# Patient Record
Sex: Male | Born: 1980 | Race: White | Hispanic: No | Marital: Single
Health system: Midwestern US, Community
[De-identification: ages and names within clinical notes are randomized; demographics above are authoritative.]

## PROBLEM LIST (undated history)

## (undated) DIAGNOSIS — N200 Calculus of kidney: Secondary | ICD-10-CM

## (undated) DIAGNOSIS — Q211 Atrial septal defect, unspecified: Secondary | ICD-10-CM

## (undated) DIAGNOSIS — I10 Essential (primary) hypertension: Secondary | ICD-10-CM

## (undated) DIAGNOSIS — A048 Other specified bacterial intestinal infections: Secondary | ICD-10-CM

## (undated) HISTORY — PX: ASD REPAIR: SHX258

## (undated) HISTORY — PX: FOOT SURGERY: SHX648

## (undated) HISTORY — PX: KNEE SURGERY: SHX244

---

## 2002-08-01 ENCOUNTER — Emergency Department (HOSPITAL_COMMUNITY): Admission: EM | Admit: 2002-08-01 | Discharge: 2002-08-01 | Payer: Self-pay | Admitting: Emergency Medicine

## 2004-02-24 ENCOUNTER — Emergency Department (HOSPITAL_COMMUNITY): Admission: EM | Admit: 2004-02-24 | Discharge: 2004-02-24 | Payer: Self-pay | Admitting: Emergency Medicine

## 2009-03-21 ENCOUNTER — Emergency Department: Payer: Self-pay | Admitting: Emergency Medicine

## 2009-07-21 ENCOUNTER — Emergency Department (HOSPITAL_COMMUNITY): Admission: EM | Admit: 2009-07-21 | Discharge: 2009-07-21 | Payer: Self-pay | Admitting: Emergency Medicine

## 2009-07-23 ENCOUNTER — Emergency Department: Payer: Self-pay | Admitting: Emergency Medicine

## 2013-03-20 ENCOUNTER — Emergency Department: Payer: Self-pay | Admitting: Emergency Medicine

## 2013-03-20 LAB — URINALYSIS, COMPLETE
Bacteria: NONE SEEN
Bilirubin,UR: NEGATIVE
Blood: NEGATIVE
Glucose,UR: NEGATIVE mg/dL (ref 0–75)
Ketone: NEGATIVE
Leukocyte Esterase: NEGATIVE
Nitrite: NEGATIVE
Ph: 5 (ref 4.5–8.0)
Protein: NEGATIVE
RBC,UR: <1 /HPF (ref 0–5)
Specific Gravity: 1.019 (ref 1.003–1.030)
Squamous Epithelial: NONE SEEN
WBC UR: 1 /HPF (ref 0–5)

## 2013-09-27 ENCOUNTER — Emergency Department: Payer: Self-pay | Admitting: Emergency Medicine

## 2013-09-27 LAB — CBC
HCT: 44.5 % (ref 40.0–52.0)
HGB: 14.9 g/dL (ref 13.0–18.0)
MCH: 30.9 pg (ref 26.0–34.0)
MCHC: 33.4 g/dL (ref 32.0–36.0)
MCV: 93 fL (ref 80–100)
Platelet: 234 10*3/uL (ref 150–440)
RBC: 4.81 10*6/uL (ref 4.40–5.90)
RDW: 12.8 % (ref 11.5–14.5)
WBC: 12 10*3/uL — ABNORMAL HIGH (ref 3.8–10.6)

## 2013-09-27 LAB — URINALYSIS, COMPLETE
Bacteria: NONE SEEN
Bilirubin,UR: NEGATIVE
Blood: NEGATIVE
Glucose,UR: NEGATIVE mg/dL (ref 0–75)
Ketone: NEGATIVE
Leukocyte Esterase: NEGATIVE
Nitrite: NEGATIVE
Ph: 8 (ref 4.5–8.0)
Protein: NEGATIVE
RBC,UR: NONE SEEN /HPF (ref 0–5)
Specific Gravity: 1.01 (ref 1.003–1.030)
Squamous Epithelial: NONE SEEN
WBC UR: NONE SEEN /HPF (ref 0–5)

## 2013-09-27 LAB — COMPREHENSIVE METABOLIC PANEL
Albumin: 3.8 g/dL (ref 3.4–5.0)
Alkaline Phosphatase: 63 U/L
Anion Gap: 9 (ref 7–16)
BUN: 11 mg/dL (ref 7–18)
Bilirubin,Total: 1.5 mg/dL — ABNORMAL HIGH (ref 0.2–1.0)
Calcium, Total: 8.7 mg/dL (ref 8.5–10.1)
Chloride: 103 mmol/L (ref 98–107)
Co2: 25 mmol/L (ref 21–32)
Creatinine: 1.03 mg/dL (ref 0.60–1.30)
EGFR (African American): >60
EGFR (Non-African Amer.): >60
Glucose: 101 mg/dL — ABNORMAL HIGH (ref 65–99)
Osmolality: 273 (ref 275–301)
Potassium: 3.6 mmol/L (ref 3.5–5.1)
SGOT(AST): 36 U/L (ref 15–37)
SGPT (ALT): 23 U/L
Sodium: 137 mmol/L (ref 136–145)
Total Protein: 7.8 g/dL (ref 6.4–8.2)

## 2013-09-27 LAB — DIFFERENTIAL
Basophil #: 0 10*3/uL (ref 0.0–0.1)
Basophil %: 0.2 %
Eosinophil #: 0 10*3/uL (ref 0.0–0.7)
Eosinophil %: 0.1 %
Lymphocyte #: 0.5 10*3/uL — ABNORMAL LOW (ref 1.0–3.6)
Lymphocyte %: 4.8 %
Monocyte #: 0.2 x10 3/mm (ref 0.2–1.0)
Monocyte %: 1.6 %
Neutrophil #: 10.7 10*3/uL — ABNORMAL HIGH (ref 1.4–6.5)
Neutrophil %: 93.3 %

## 2013-09-27 LAB — LIPASE, BLOOD: Lipase: 105 U/L (ref 73–393)

## 2013-10-02 LAB — CULTURE, BLOOD (SINGLE)

## 2013-10-23 ENCOUNTER — Emergency Department: Payer: Self-pay | Admitting: Emergency Medicine

## 2013-10-23 LAB — COMPREHENSIVE METABOLIC PANEL
Albumin: 3.6 g/dL (ref 3.4–5.0)
Alkaline Phosphatase: 66 U/L
Anion Gap: 13 (ref 7–16)
BUN: 8 mg/dL (ref 7–18)
Bilirubin,Total: 0.3 mg/dL (ref 0.2–1.0)
Calcium, Total: 8.6 mg/dL (ref 8.5–10.1)
Chloride: 111 mmol/L — ABNORMAL HIGH (ref 98–107)
Co2: 24 mmol/L (ref 21–32)
Creatinine: 1.04 mg/dL (ref 0.60–1.30)
EGFR (African American): >60
EGFR (Non-African Amer.): >60
Glucose: 85 mg/dL (ref 65–99)
Osmolality: 292 (ref 275–301)
Potassium: 3.7 mmol/L (ref 3.5–5.1)
SGOT(AST): 56 U/L — ABNORMAL HIGH (ref 15–37)
SGPT (ALT): 29 U/L
Sodium: 148 mmol/L — ABNORMAL HIGH (ref 136–145)
Total Protein: 7.7 g/dL (ref 6.4–8.2)

## 2013-10-23 LAB — SALICYLATE LEVEL: Salicylates, Serum: 4.3 mg/dL — ABNORMAL HIGH

## 2013-10-23 LAB — CBC
HCT: 45.7 % (ref 40.0–52.0)
HGB: 15 g/dL (ref 13.0–18.0)
MCH: 30.5 pg (ref 26.0–34.0)
MCHC: 32.8 g/dL (ref 32.0–36.0)
MCV: 93 fL (ref 80–100)
Platelet: 268 10*3/uL (ref 150–440)
RBC: 4.91 10*6/uL (ref 4.40–5.90)
RDW: 13.8 % (ref 11.5–14.5)
WBC: 14.9 10*3/uL — ABNORMAL HIGH (ref 3.8–10.6)

## 2013-10-23 LAB — ETHANOL: Ethanol: 293 mg/dL

## 2013-10-23 LAB — LIPASE, BLOOD: Lipase: 225 U/L (ref 73–393)

## 2013-10-23 LAB — ACETAMINOPHEN LEVEL: Acetaminophen: <2 ug/mL

## 2013-11-27 ENCOUNTER — Emergency Department: Payer: Self-pay | Admitting: Student

## 2013-11-27 LAB — URINALYSIS, COMPLETE
Bacteria: NONE SEEN
Bilirubin,UR: NEGATIVE
Blood: NEGATIVE
Glucose,UR: NEGATIVE mg/dL (ref 0–75)
Ketone: NEGATIVE
Leukocyte Esterase: NEGATIVE
Nitrite: NEGATIVE
Ph: 6 (ref 4.5–8.0)
Protein: NEGATIVE
RBC,UR: 1 /HPF (ref 0–5)
Specific Gravity: 1.001 (ref 1.003–1.030)
Squamous Epithelial: NONE SEEN
WBC UR: 1 /HPF (ref 0–5)

## 2013-11-27 LAB — COMPREHENSIVE METABOLIC PANEL
Albumin: 4.2 g/dL (ref 3.4–5.0)
Alkaline Phosphatase: 112 U/L
Anion Gap: 6 — ABNORMAL LOW (ref 7–16)
BUN: 7 mg/dL (ref 7–18)
Bilirubin,Total: 0.4 mg/dL (ref 0.2–1.0)
Calcium, Total: 9 mg/dL (ref 8.5–10.1)
Chloride: 107 mmol/L (ref 98–107)
Co2: 30 mmol/L (ref 21–32)
Creatinine: 0.82 mg/dL (ref 0.60–1.30)
EGFR (African American): >60
EGFR (Non-African Amer.): >60
Glucose: 75 mg/dL (ref 65–99)
Osmolality: 282 (ref 275–301)
Potassium: 4.2 mmol/L (ref 3.5–5.1)
SGOT(AST): 29 U/L (ref 15–37)
SGPT (ALT): 22 U/L
Sodium: 143 mmol/L (ref 136–145)
Total Protein: 9.1 g/dL — ABNORMAL HIGH (ref 6.4–8.2)

## 2013-11-27 LAB — ETHANOL: Ethanol: 171 mg/dL

## 2013-11-27 LAB — DRUG SCREEN, URINE
Amphetamines, Ur Screen: NEGATIVE (ref ?–1000)
Barbiturates, Ur Screen: NEGATIVE (ref ?–200)
Benzodiazepine, Ur Scrn: POSITIVE (ref ?–200)
Cannabinoid 50 Ng, Ur ~~LOC~~: POSITIVE (ref ?–50)
Cocaine Metabolite,Ur ~~LOC~~: NEGATIVE (ref ?–300)
MDMA (Ecstasy)Ur Screen: NEGATIVE (ref ?–500)
Methadone, Ur Screen: NEGATIVE (ref ?–300)
Opiate, Ur Screen: NEGATIVE (ref ?–300)
Phencyclidine (PCP) Ur S: NEGATIVE (ref ?–25)
Tricyclic, Ur Screen: NEGATIVE (ref ?–1000)

## 2013-11-27 LAB — CBC
HCT: 48.4 % (ref 40.0–52.0)
HGB: 16.1 g/dL (ref 13.0–18.0)
MCH: 31.4 pg (ref 26.0–34.0)
MCHC: 33.2 g/dL (ref 32.0–36.0)
MCV: 95 fL (ref 80–100)
Platelet: 273 10*3/uL (ref 150–440)
RBC: 5.12 10*6/uL (ref 4.40–5.90)
RDW: 13.6 % (ref 11.5–14.5)
WBC: 10.1 10*3/uL (ref 3.8–10.6)

## 2013-11-27 LAB — ACETAMINOPHEN LEVEL: Acetaminophen: <2 ug/mL

## 2013-11-27 LAB — SALICYLATE LEVEL: Salicylates, Serum: 5.3 mg/dL — ABNORMAL HIGH

## 2013-12-19 ENCOUNTER — Emergency Department: Payer: Self-pay | Admitting: Emergency Medicine

## 2013-12-19 LAB — BASIC METABOLIC PANEL
Anion Gap: 8 (ref 7–16)
BUN: 7 mg/dL (ref 7–18)
Calcium, Total: 8.1 mg/dL — ABNORMAL LOW (ref 8.5–10.1)
Chloride: 109 mmol/L — ABNORMAL HIGH (ref 98–107)
Co2: 24 mmol/L (ref 21–32)
Creatinine: 0.97 mg/dL (ref 0.60–1.30)
EGFR (African American): >60
EGFR (Non-African Amer.): >60
Glucose: 90 mg/dL (ref 65–99)
Osmolality: 279 (ref 275–301)
Potassium: 3.5 mmol/L (ref 3.5–5.1)
Sodium: 141 mmol/L (ref 136–145)

## 2013-12-19 LAB — CBC
HCT: 43.6 % (ref 40.0–52.0)
HGB: 14 g/dL (ref 13.0–18.0)
MCH: 30.8 pg (ref 26.0–34.0)
MCHC: 32.2 g/dL (ref 32.0–36.0)
MCV: 96 fL (ref 80–100)
Platelet: 265 10*3/uL (ref 150–440)
RBC: 4.55 10*6/uL (ref 4.40–5.90)
RDW: 14 % (ref 11.5–14.5)
WBC: 6.1 10*3/uL (ref 3.8–10.6)

## 2013-12-19 LAB — TROPONIN I: Troponin-I: <0.02 ng/mL

## 2014-09-22 ENCOUNTER — Emergency Department
Admission: EM | Admit: 2014-09-22 | Discharge: 2014-09-22 | Disposition: A | Discharge status: Home / Self Care | Payer: Self-pay | Attending: Emergency Medicine | Admitting: Emergency Medicine

## 2014-09-22 ENCOUNTER — Emergency Department: Payer: Self-pay

## 2014-09-22 DIAGNOSIS — S46012A Strain of muscle(s) and tendon(s) of the rotator cuff of left shoulder, initial encounter: Secondary | ICD-10-CM | POA: Insufficient documentation

## 2014-09-22 DIAGNOSIS — Y998 Other external cause status: Secondary | ICD-10-CM | POA: Insufficient documentation

## 2014-09-22 DIAGNOSIS — S40012A Contusion of left shoulder, initial encounter: Secondary | ICD-10-CM | POA: Insufficient documentation

## 2014-09-22 DIAGNOSIS — W228XXA Striking against or struck by other objects, initial encounter: Secondary | ICD-10-CM | POA: Insufficient documentation

## 2014-09-22 DIAGNOSIS — S46912A Strain of unspecified muscle, fascia and tendon at shoulder and upper arm level, left arm, initial encounter: Secondary | ICD-10-CM

## 2014-09-22 DIAGNOSIS — Y92480 Sidewalk as the place of occurrence of the external cause: Secondary | ICD-10-CM | POA: Insufficient documentation

## 2014-09-22 DIAGNOSIS — Y9389 Activity, other specified: Secondary | ICD-10-CM | POA: Insufficient documentation

## 2014-09-22 DIAGNOSIS — Z720 Tobacco use: Secondary | ICD-10-CM | POA: Insufficient documentation

## 2014-09-22 MED ORDER — KETOROLAC TROMETHAMINE 10 MG PO TABS: 10.0000 mg | ORAL_TABLET | Freq: Three times a day (TID) | ORAL | Status: DC

## 2014-09-22 MED ORDER — CYCLOBENZAPRINE HCL 5 MG PO TABS: 5.0000 mg | ORAL_TABLET | Freq: Three times a day (TID) | ORAL | Status: DC | PRN

## 2014-09-22 NOTE — ED Notes (Signed)
Pt reports to ED w/ c/o of shoulder pain.  Pt sts it began last night, denies any injury.
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
## 2014-09-22 NOTE — Discharge Instructions (Signed)
Shoulder Sprain °A shoulder sprain is the result of damage to the tough, fiber-like tissues (ligaments) that help hold your shoulder in place. The ligaments may be stretched or torn. Besides the main shoulder joint (the ball and socket), there are several smaller joints that connect the bones in this area. A sprain usually involves one of those joints. Most often it is the acromioclavicular (or AC) joint. That is the joint that connects the collarbone (clavicle) and the shoulder blade (scapula) at the top point of the shoulder blade (acromion). °A shoulder sprain is a mild form of what is called a shoulder separation. Recovering from a shoulder sprain may take some time. For some, pain lingers for several months. Most people recover without long term problems. °CAUSES  °· A shoulder sprain is usually caused by some kind of trauma. This might be: °¨ Falling on an outstretched arm. °¨ Being hit hard on the shoulder. °¨ Twisting the arm. °· Shoulder sprains are more likely to occur in people who: °¨ Play sports. °¨ Have balance or coordination problems. °SYMPTOMS  °· Pain when you move your shoulder. °· Limited ability to move the shoulder. °· Swelling and tenderness on top of the shoulder. °· Redness or warmth in the shoulder. °· Bruising. °· A change in the shape of the shoulder. °DIAGNOSIS  °Your healthcare provider may: °· Ask about your symptoms. °· Ask about recent activity that might have caused those symptoms. °· Examine your shoulder. You may be asked to do simple exercises to test movement. The other shoulder will be examined for comparison. °· Order some tests that provide a look inside the body. They can show the extent of the injury. The tests could include: °¨ X-rays. °¨ CT (computed tomography) scan. °¨ MRI (magnetic resonance imaging) scan. °RISKS AND COMPLICATIONS °· Loss of full shoulder motion. °· Ongoing shoulder pain. °TREATMENT  °How long it takes to recover from a shoulder sprain depends on how  severe it was. Treatment options may include: °· Rest. You should not use the arm or shoulder until it heals. °· Ice. For 2 or 3 days after the injury, put an ice pack on the shoulder up to 4 times a day. It should stay on for 15 to 20 minutes each time. Wrap the ice in a towel so it does not touch your skin. °· Over-the-counter medicine to relieve pain. °· A sling or brace. This will keep the arm still while the shoulder is healing. °· Physical therapy or rehabilitation exercises. These will help you regain strength and motion. Ask your healthcare provider when it is OK to begin these exercises. °· Surgery. The need for surgery is rare with a sprained shoulder, but some people may need surgery to keep the joint in place and reduce pain. °HOME CARE INSTRUCTIONS  °· Ask your healthcare provider about what you should and should not do while your shoulder heals. °· Make sure you know how to apply ice to the correct area of your shoulder. °· Talk with your healthcare provider about which medications should be used for pain and swelling. °· If rehabilitation therapy will be needed, ask your healthcare provider to refer you to a therapist. If it is not recommended, then ask about at-home exercises. Find out when exercise should begin. °SEEK MEDICAL CARE IF:  °Your pain, swelling, or redness at the joint increases. °SEEK IMMEDIATE MEDICAL CARE IF:  °· You have a fever. °· You cannot move your arm or shoulder. °Document Released: 06/29/2008 Document   Revised: 05/05/2011 Document Reviewed: 06/29/2008 [employer] Patient Information 2015 [place], [place]. This information is not intended to replace advice given to you by your health care provider. Make sure you discuss any questions you have with your health care provider.   Take the prescription meds as directed.  Apply ice and perform ROM exercises.  Follow-up with Dr. Ernest Pine as needed.
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
## 2014-12-01 ENCOUNTER — Encounter: Payer: Self-pay | Admitting: *Deleted

## 2014-12-01 DIAGNOSIS — Y998 Other external cause status: Secondary | ICD-10-CM | POA: Insufficient documentation

## 2014-12-01 DIAGNOSIS — Y9289 Other specified places as the place of occurrence of the external cause: Secondary | ICD-10-CM | POA: Insufficient documentation

## 2014-12-01 DIAGNOSIS — Z880 Allergy status to penicillin: Secondary | ICD-10-CM | POA: Insufficient documentation

## 2014-12-01 DIAGNOSIS — S6991XA Unspecified injury of right wrist, hand and finger(s), initial encounter: Secondary | ICD-10-CM | POA: Insufficient documentation

## 2014-12-01 DIAGNOSIS — Z79899 Other long term (current) drug therapy: Secondary | ICD-10-CM | POA: Insufficient documentation

## 2014-12-01 DIAGNOSIS — W1839XA Other fall on same level, initial encounter: Secondary | ICD-10-CM | POA: Insufficient documentation

## 2014-12-01 DIAGNOSIS — I10 Essential (primary) hypertension: Secondary | ICD-10-CM | POA: Insufficient documentation

## 2014-12-01 DIAGNOSIS — Z791 Long term (current) use of non-steroidal anti-inflammatories (NSAID): Secondary | ICD-10-CM | POA: Insufficient documentation

## 2014-12-01 DIAGNOSIS — Y9389 Activity, other specified: Secondary | ICD-10-CM | POA: Insufficient documentation

## 2014-12-01 DIAGNOSIS — S0181XA Laceration without foreign body of other part of head, initial encounter: Secondary | ICD-10-CM | POA: Insufficient documentation

## 2014-12-01 DIAGNOSIS — Z720 Tobacco use: Secondary | ICD-10-CM | POA: Insufficient documentation

## 2014-12-01 NOTE — ED Notes (Signed)
Pt presents in [employer] custody w/ laceration superior to L eyebrow and abrasion to L cheekbone.

## 2014-12-02 ENCOUNTER — Emergency Department

## 2014-12-02 ENCOUNTER — Emergency Department
Admission: EM | Admit: 2014-12-02 | Discharge: 2014-12-02 | Disposition: A | Discharge status: Home / Self Care | Attending: Emergency Medicine | Admitting: Emergency Medicine

## 2014-12-02 DIAGNOSIS — R51 Headache: Secondary | ICD-10-CM

## 2014-12-02 DIAGNOSIS — R519 Headache, unspecified: Secondary | ICD-10-CM

## 2014-12-02 DIAGNOSIS — T148XXA Other injury of unspecified body region, initial encounter: Secondary | ICD-10-CM

## 2014-12-02 DIAGNOSIS — IMO0002 Reserved for concepts with insufficient information to code with codable children: Secondary | ICD-10-CM

## 2014-12-02 HISTORY — DX: Atrial septal defect, unspecified: Q21.10

## 2014-12-02 HISTORY — DX: Calculus of kidney: N20.0

## 2014-12-02 HISTORY — DX: Essential (primary) hypertension: I10

## 2014-12-02 HISTORY — DX: Atrial septal defect: Q21.1

## 2014-12-02 LAB — CBC WITH DIFFERENTIAL/PLATELET
Basophils Absolute: 0.1 10*3/uL (ref 0–0.1)
Basophils Relative: 1 %
EOS ABS: 0 10*3/uL (ref 0–0.7)
Eosinophils Relative: 0 %
HEMATOCRIT: 43 % (ref 40.0–52.0)
Hemoglobin: 14.6 g/dL (ref 13.0–18.0)
Lymphocytes Relative: 19 %
Lymphs Abs: 1.7 10*3/uL (ref 1.0–3.6)
MCH: 32 pg (ref 26.0–34.0)
MCHC: 33.9 g/dL (ref 32.0–36.0)
MCV: 94.3 fL (ref 80.0–100.0)
MONOS PCT: 7 %
Monocytes Absolute: 0.6 10*3/uL (ref 0.2–1.0)
NEUTROS PCT: 73 %
Neutro Abs: 6.4 10*3/uL (ref 1.4–6.5)
Platelets: 229 10*3/uL (ref 150–440)
RBC: 4.56 MIL/uL (ref 4.40–5.90)
RDW: 13.2 % (ref 11.5–14.5)
WBC: 8.8 10*3/uL (ref 3.8–10.6)

## 2014-12-02 LAB — BASIC METABOLIC PANEL
ANION GAP: 9 (ref 5–15)
BUN: 9 mg/dL (ref 6–20)
CHLORIDE: 107 mmol/L (ref 101–111)
CO2: 24 mmol/L (ref 22–32)
Calcium: 8.5 mg/dL — ABNORMAL LOW (ref 8.9–10.3)
Creatinine, Ser: 0.67 mg/dL (ref 0.61–1.24)
GFR calc Af Amer: >60 mL/min (ref >60)
GFR calc non Af Amer: >60 mL/min (ref >60)
GLUCOSE: 87 mg/dL (ref 65–99)
POTASSIUM: 3.5 mmol/L (ref 3.5–5.1)
Sodium: 140 mmol/L (ref 135–145)

## 2014-12-02 MED ORDER — SODIUM CHLORIDE 0.9 % IV BOLUS (SEPSIS)
1000.0000 mL | Freq: Once | INTRAVENOUS | Status: AC
  Administered 2014-12-02: 1000 mL via INTRAVENOUS

## 2014-12-02 MED ORDER — IOHEXOL 350 MG/ML SOLN
100.0000 mL | Freq: Once | INTRAVENOUS | Status: AC | PRN
  Administered 2014-12-02: 100 mL via INTRAVENOUS
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
## 2014-12-02 NOTE — ED Notes (Signed)
Pt presents to ED with lacerations to above and below the left eye, laceration is bandaged with no bleeding noted at this time. Pt is unsure of exact mechanism of injury, states "I believe I just fell".  Pt in custody of [employer] officer upon arrival, officer in room at this time.

## 2016-06-04 ENCOUNTER — Emergency Department: Payer: Self-pay

## 2016-06-04 ENCOUNTER — Emergency Department
Admission: EM | Admit: 2016-06-04 | Discharge: 2016-06-04 | Disposition: A | Discharge status: Home / Self Care | Payer: Self-pay | Attending: Emergency Medicine | Admitting: Emergency Medicine

## 2016-06-04 ENCOUNTER — Encounter: Payer: Self-pay | Admitting: Medical Oncology

## 2016-06-04 ENCOUNTER — Encounter: Payer: Self-pay | Admitting: Emergency Medicine

## 2016-06-04 DIAGNOSIS — R519 Headache, unspecified: Secondary | ICD-10-CM

## 2016-06-04 DIAGNOSIS — Y999 Unspecified external cause status: Secondary | ICD-10-CM | POA: Insufficient documentation

## 2016-06-04 DIAGNOSIS — F17210 Nicotine dependence, cigarettes, uncomplicated: Secondary | ICD-10-CM | POA: Insufficient documentation

## 2016-06-04 DIAGNOSIS — Y929 Unspecified place or not applicable: Secondary | ICD-10-CM | POA: Insufficient documentation

## 2016-06-04 DIAGNOSIS — I10 Essential (primary) hypertension: Secondary | ICD-10-CM | POA: Insufficient documentation

## 2016-06-04 DIAGNOSIS — S51812A Laceration without foreign body of left forearm, initial encounter: Secondary | ICD-10-CM | POA: Insufficient documentation

## 2016-06-04 DIAGNOSIS — W25XXXA Contact with sharp glass, initial encounter: Secondary | ICD-10-CM | POA: Insufficient documentation

## 2016-06-04 DIAGNOSIS — Z79899 Other long term (current) drug therapy: Secondary | ICD-10-CM | POA: Insufficient documentation

## 2016-06-04 DIAGNOSIS — Y939 Activity, unspecified: Secondary | ICD-10-CM | POA: Insufficient documentation

## 2016-06-04 DIAGNOSIS — R51 Headache: Secondary | ICD-10-CM | POA: Insufficient documentation

## 2016-06-04 DIAGNOSIS — R109 Unspecified abdominal pain: Secondary | ICD-10-CM | POA: Insufficient documentation

## 2016-06-04 DIAGNOSIS — R0789 Other chest pain: Secondary | ICD-10-CM | POA: Insufficient documentation

## 2016-06-04 LAB — URINALYSIS, COMPLETE (UACMP) WITH MICROSCOPIC
BACTERIA UA: NONE SEEN
Bilirubin Urine: NEGATIVE
Glucose, UA: NEGATIVE mg/dL
HGB URINE DIPSTICK: NEGATIVE
KETONES UR: NEGATIVE mg/dL
LEUKOCYTES UA: NEGATIVE
NITRITE: NEGATIVE
Protein, ur: NEGATIVE mg/dL
SQUAMOUS EPITHELIAL / LPF: NONE SEEN
Specific Gravity, Urine: 1.006 (ref 1.005–1.030)
pH: 6 (ref 5.0–8.0)

## 2016-06-04 LAB — BASIC METABOLIC PANEL
Anion gap: 9 (ref 5–15)
BUN: 8 mg/dL (ref 6–20)
CALCIUM: 9.1 mg/dL (ref 8.9–10.3)
CO2: 25 mmol/L (ref 22–32)
CREATININE: 0.82 mg/dL (ref 0.61–1.24)
Chloride: 105 mmol/L (ref 101–111)
GFR calc non Af Amer: >60 mL/min (ref >60)
GLUCOSE: 92 mg/dL (ref 65–99)
Potassium: 3.4 mmol/L — ABNORMAL LOW (ref 3.5–5.1)
Sodium: 139 mmol/L (ref 135–145)

## 2016-06-04 LAB — CBC
HCT: 47.5 % (ref 40.0–52.0)
Hemoglobin: 16.2 g/dL (ref 13.0–18.0)
MCH: 30.9 pg (ref 26.0–34.0)
MCHC: 34.1 g/dL (ref 32.0–36.0)
MCV: 90.8 fL (ref 80.0–100.0)
PLATELETS: 228 10*3/uL (ref 150–440)
RBC: 5.24 MIL/uL (ref 4.40–5.90)
RDW: 15.3 % — ABNORMAL HIGH (ref 11.5–14.5)
WBC: 6.1 10*3/uL (ref 3.8–10.6)

## 2016-06-04 LAB — TROPONIN I

## 2016-06-04 MED ORDER — KETOROLAC TROMETHAMINE 30 MG/ML IJ SOLN
30.0000 mg | Freq: Once | INTRAMUSCULAR | Status: AC
  Administered 2016-06-04: 30 mg via INTRAVENOUS
  Filled 2016-06-04: qty 1

## 2016-06-04 MED ORDER — IBUPROFEN 800 MG PO TABS: 800.0000 mg | ORAL_TABLET | Freq: Three times a day (TID) | ORAL | 0 refills | Status: DC | PRN

## 2016-06-04 MED ORDER — OXYCODONE-ACETAMINOPHEN 5-325 MG PO TABS: 1.0000 | ORAL_TABLET | Freq: Four times a day (QID) | ORAL | 0 refills | Status: DC | PRN

## 2016-06-04 MED ORDER — LIDOCAINE HCL (PF) 1 % IJ SOLN
INTRAMUSCULAR | Status: AC
  Filled 2016-06-04: qty 5

## 2016-06-04 MED ORDER — SODIUM CHLORIDE 0.9 % IV SOLN
Freq: Once | INTRAVENOUS | Status: AC
  Administered 2016-06-04: 10:00:00 via INTRAVENOUS

## 2016-06-04 MED ORDER — METOCLOPRAMIDE HCL 5 MG/ML IJ SOLN
10.0000 mg | Freq: Once | INTRAMUSCULAR | Status: AC
  Administered 2016-06-04: 10 mg via INTRAVENOUS
  Filled 2016-06-04: qty 2

## 2016-06-04 NOTE — ED Notes (Signed)
Patient to Room 25, [facility] RN aware.
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
## 2016-06-04 NOTE — ED Triage Notes (Signed)
Patient here complaining of H/A (current), intermittent chest pain (not since yesterday), neck pain (current), left side abd. Pain describes it as "testicle".  All symptoms present for greater than 3 mos.  Patient states that the abd. Pain and his H/A and neck pain are what concerns him today.

## 2016-06-04 NOTE — ED Provider Notes (Signed)
[facility] Emergency Department Provider Note       Time seen: ----------------------------------------- 9:58 AM on 06/04/2016 -----------------------------------------     I have reviewed the triage vital signs and the nursing notes.   HISTORY   Chief Complaint Headache; Chest Pain; and Testicle Pain    HPI John [name] is a 36 y.o. male who presents to the ED for multiple complaints. Patient is here complaining of headache which he has currently, intermittent chest pain and currently with neck pain and left-sided abdominal pain. All the symptoms have been greater than 3 months. Patient states that the abdominal pain and his headache and neck pain with concerns today. Pain is 6 out of 10 in his head that is sharp, nothing makes it better or worse.   Past Medical History:  Diagnosis Date  . ASD (atrial septal defect)   . Hypertension   . Kidney stones     There are no active problems to display for this patient.   Past Surgical History:  Procedure Laterality Date  . ASD REPAIR    . FOOT SURGERY Right   . KNEE SURGERY Right     Allergies Penicillins  Social History Social History  Substance Use Topics  . Smoking status: Current Every Day Smoker    Packs/day: 0.50    Types: Cigarettes  . Smokeless tobacco: Never Used  . Alcohol use Yes     Comment: occasional    Review of Systems Constitutional: Negative for fever. Cardiovascular: Positive for chest pain Respiratory: Negative for shortness of breath. Gastrointestinal: Positive for flank pain Genitourinary: Negative for dysuria. Musculoskeletal: Positive for neck pain Skin: Negative for rash. Neurological: Positive for headache  10-point ROS otherwise negative.  ____________________________________________   PHYSICAL EXAM:  VITAL SIGNS: ED Triage Vitals  Enc Vitals Group     BP 06/04/16 0939 (!) 148/72     Pulse Rate 06/04/16 0939 (!) 101     Resp 06/04/16 0939 18      Temp 06/04/16 0939 98.8 F (37.1 C)     Temp Source 06/04/16 0939 Oral     SpO2 06/04/16 0939 97 %     Weight 06/04/16 0939 170 lb (77.1 kg)     Height 06/04/16 0939  (1.778 m)     Head Circumference --      Peak Flow --      Pain Score 06/04/16 0945 6     Pain Loc --      Pain Edu? --      Excl. in GC? --     Constitutional: Alert and oriented. Well appearing and in no distress. Eyes: Conjunctivae are normal. PERRL. Normal extraocular movements. ENT   Head: Normocephalic and atraumatic.   Nose: No congestion/rhinnorhea.   Mouth/Throat: Mucous membranes are moist.   Neck: No stridor. Cardiovascular: Normal rate, regular rhythm. No murmurs, rubs, or gallops. Respiratory: Normal respiratory effort without tachypnea nor retractions. Breath sounds are clear and equal bilaterally. No wheezes/rales/rhonchi. Gastrointestinal: Soft and nontender. Normal bowel sounds Musculoskeletal: Nontender with normal range of motion in extremities. No lower extremity tenderness nor edema. Neurologic:  Normal speech and language. No gross focal neurologic deficits are appreciated.  Skin:  Skin is warm, dry and intact. No rash noted. Psychiatric: Mood and affect are normal. Speech and behavior are normal.  ____________________________________________  EKG: Interpreted by me.Sinus rhythm rate 86 bpm, normal PR interval, normal QRS, normal QT, normal axis.  ____________________________________________  ED COURSE:  Pertinent labs & imaging  results that were available during my care of the patient were reviewed by me and considered in my medical decision making (see chart for details). Patient presents for multiple complaints, we will assess with labs and imaging as indicated.   Procedures ____________________________________________   LABS (pertinent positives/negatives)  Labs Reviewed  BASIC METABOLIC PANEL - Abnormal; Notable for the following:       Result Value    Potassium 3.4 (*)    All other components within normal limits  CBC - Abnormal; Notable for the following:    RDW 15.3 (*)    All other components within normal limits  URINALYSIS, COMPLETE (UACMP) WITH MICROSCOPIC - Abnormal; Notable for the following:    Color, Urine YELLOW (*)    APPearance CLEAR (*)    All other components within normal limits  TROPONIN I    RADIOLOGY  Chest x-ray  IMPRESSION: No active disease. ____________________________________________  FINAL ASSESSMENT AND PLAN  Headache, chest pain, abdominal pain  Plan: Patient's labs and imaging were dictated above. Patient had presented for multiple complaints of uncertain etiology. His workup has been largely negative here. Headache is currently improved, he is stable for outpatient follow-up.   Emily Filbert, MD   Note: This note was generated in part or whole with voice recognition software. Voice recognition is usually quite accurate but there are transcription errors that can and very often do occur. I apologize for any typographical errors that were not detected and corrected.     Emily Filbert, MD 06/04/16 1105

## 2016-06-04 NOTE — ED Notes (Signed)
telfa dressing and coban applied to left arm. Circulation remains WNL.

## 2016-06-04 NOTE — ED Notes (Signed)
See triage note  States a piece of glass cut his left forearm while he was crying a hutch  Laceration approx 1 inch

## 2016-06-04 NOTE — ED Triage Notes (Signed)
Pt reports he was carrying a hutch and a piece of glass cut the left forearm. Bleeding controlled.

## 2016-06-19 ENCOUNTER — Emergency Department
Admission: EM | Admit: 2016-06-19 | Discharge: 2016-06-19 | Disposition: A | Discharge status: Home / Self Care | Payer: Self-pay | Attending: Student in an Organized Health Care Education/Training Program | Admitting: Student in an Organized Health Care Education/Training Program

## 2016-06-19 ENCOUNTER — Emergency Department: Payer: Self-pay

## 2016-06-19 ENCOUNTER — Encounter: Payer: Self-pay | Admitting: Emergency Medicine

## 2016-06-19 DIAGNOSIS — R197 Diarrhea, unspecified: Secondary | ICD-10-CM | POA: Insufficient documentation

## 2016-06-19 DIAGNOSIS — R1032 Left lower quadrant pain: Secondary | ICD-10-CM | POA: Insufficient documentation

## 2016-06-19 DIAGNOSIS — F17210 Nicotine dependence, cigarettes, uncomplicated: Secondary | ICD-10-CM | POA: Insufficient documentation

## 2016-06-19 DIAGNOSIS — I10 Essential (primary) hypertension: Secondary | ICD-10-CM | POA: Insufficient documentation

## 2016-06-19 DIAGNOSIS — R05 Cough: Secondary | ICD-10-CM | POA: Insufficient documentation

## 2016-06-19 HISTORY — DX: Other specified bacterial intestinal infections: A04.8

## 2016-06-19 LAB — LIPASE, BLOOD: LIPASE: 24 U/L (ref 11–51)

## 2016-06-19 LAB — CBC
HEMATOCRIT: 44.9 % (ref 40.0–52.0)
HEMOGLOBIN: 15.5 g/dL (ref 13.0–18.0)
MCH: 31.6 pg (ref 26.0–34.0)
MCHC: 34.5 g/dL (ref 32.0–36.0)
MCV: 91.7 fL (ref 80.0–100.0)
Platelets: 265 10*3/uL (ref 150–440)
RBC: 4.89 MIL/uL (ref 4.40–5.90)
RDW: 14.8 % — ABNORMAL HIGH (ref 11.5–14.5)
WBC: 8.5 10*3/uL (ref 3.8–10.6)

## 2016-06-19 LAB — URINALYSIS, COMPLETE (UACMP) WITH MICROSCOPIC
BACTERIA UA: NONE SEEN
BILIRUBIN URINE: NEGATIVE
Glucose, UA: NEGATIVE mg/dL
HGB URINE DIPSTICK: NEGATIVE
KETONES UR: NEGATIVE mg/dL
LEUKOCYTES UA: NEGATIVE
Nitrite: NEGATIVE
PROTEIN: NEGATIVE mg/dL
RBC / HPF: NONE SEEN RBC/hpf (ref 0–5)
SQUAMOUS EPITHELIAL / LPF: NONE SEEN
Specific Gravity, Urine: 1.019 (ref 1.005–1.030)
WBC UA: NONE SEEN WBC/hpf (ref 0–5)
pH: 8 (ref 5.0–8.0)

## 2016-06-19 LAB — COMPREHENSIVE METABOLIC PANEL
ALT: 13 U/L — ABNORMAL LOW (ref 17–63)
AST: 30 U/L (ref 15–41)
Albumin: 3.9 g/dL (ref 3.5–5.0)
Alkaline Phosphatase: 54 U/L (ref 38–126)
Anion gap: 8 (ref 5–15)
BUN: 8 mg/dL (ref 6–20)
CHLORIDE: 104 mmol/L (ref 101–111)
CO2: 23 mmol/L (ref 22–32)
Calcium: 9.3 mg/dL (ref 8.9–10.3)
Creatinine, Ser: 0.85 mg/dL (ref 0.61–1.24)
GFR calc non Af Amer: >60 mL/min (ref >60)
Glucose, Bld: 143 mg/dL — ABNORMAL HIGH (ref 65–99)
POTASSIUM: 3.7 mmol/L (ref 3.5–5.1)
SODIUM: 135 mmol/L (ref 135–145)
Total Bilirubin: 1.2 mg/dL (ref 0.3–1.2)
Total Protein: 7.5 g/dL (ref 6.5–8.1)

## 2016-06-19 LAB — TROPONIN I

## 2016-06-19 MED ORDER — DICYCLOMINE HCL 10 MG PO CAPS
10.0000 mg | ORAL_CAPSULE | Freq: Once | ORAL | Status: AC
  Administered 2016-06-19: 10 mg via ORAL
  Filled 2016-06-19: qty 1

## 2016-06-19 MED ORDER — RANITIDINE HCL 150 MG PO TABS: 150.0000 mg | ORAL_TABLET | Freq: Two times a day (BID) | ORAL | 1 refills | Status: DC

## 2016-06-19 MED ORDER — PROMETHAZINE HCL 12.5 MG PO TABS: 12.5000 mg | ORAL_TABLET | Freq: Four times a day (QID) | ORAL | 0 refills | Status: DC | PRN

## 2016-06-19 MED ORDER — DICYCLOMINE HCL 10 MG PO CAPS
10.0000 mg | ORAL_CAPSULE | Freq: Three times a day (TID) | ORAL | 0 refills | Status: DC | PRN
Start: 2016-06-19 — End: 2017-06-03

## 2016-06-19 MED ORDER — PROMETHAZINE HCL 25 MG PO TABS
12.5000 mg | ORAL_TABLET | Freq: Once | ORAL | Status: AC
  Administered 2016-06-19: 12.5 mg via ORAL
  Filled 2016-06-19: qty 1

## 2016-06-19 NOTE — Discharge Instructions (Signed)

## 2016-06-19 NOTE — ED Triage Notes (Signed)
Patient presents to the ED with left lower quadrant abdominal pain, cough, and shortness of breath that have been intermittent x 2 weeks.  Patient reports vomiting x 2 in the past 24 hours and 1 episode of diarrhea.  Patient also reports coughing up mucous tinged with blood.  Patient is in no obvious distress at this time.  Patient states pain is not severe at this time.

## 2016-06-19 NOTE — ED Provider Notes (Signed)
[facility] Emergency Department Provider Note    First MD Initiated Contact with Patient 06/19/16 1427     (approximate)  I have reviewed the triage vital signs and the nursing notes.   HISTORY  Chief Complaint Abdominal Pain; Cough; and Emesis    HPI John [name] is a 36 y.o. male presents with multiple complaints but primarily he is concerned about left lower quadrant abdominal pain and diarrhea. States the symptoms have been ongoing for the past week. Denies any pain right now. Is not having any blood in his stool. Is concerned that this is from H pylori as he states that he was admitted to [facility] for H. pylori previously. Not on any other medications. Has had nausea and decreased appetite but no vomiting. States that he is also having night sweats and felt feverish this morning.  Does admit to smoking and has had a nonproductive cough.   Past Medical History:  Diagnosis Date  . ASD (atrial septal defect)   . Bacterial infection due to H. pylori   . Hypertension   . Kidney stones    No family history on file. Past Surgical History:  Procedure Laterality Date  . ASD REPAIR    . FOOT SURGERY Right   . KNEE SURGERY Right    There are no active problems to display for this patient.     Prior to Admission medications   Medication Sig Start Date End Date Taking? Authorizing Provider  cyclobenzaprine (FLEXERIL) 5 MG tablet Take 1 tablet (5 mg total) by mouth every 8 (eight) hours as needed for muscle spasms. 09/22/14   Jenise V Bacon Menshew, PA-C  ibuprofen (ADVIL,MOTRIN) 800 MG tablet Take 1 tablet (800 mg total) by mouth every 8 (eight) hours as needed. 06/04/16   Emily Filbert, MD  ketorolac (TORADOL) 10 MG tablet Take 1 tablet (10 mg total) by mouth every 8 (eight) hours. 09/22/14   Jenise V Bacon Menshew, PA-C  oxyCODONE-acetaminophen (ROXICET) 5-325 MG tablet Take 1 tablet by mouth every 6 (six) hours as needed for moderate  pain. 06/04/16   Joni Reining, PA-C    Allergies Penicillins    Social History Social History  Substance Use Topics  . Smoking status: Current Every Day Smoker    Packs/day: 0.50    Types: Cigarettes  . Smokeless tobacco: Never Used  . Alcohol use Yes     Comment: occasional    Review of Systems Patient denies headaches, rhinorrhea, blurry vision, numbness, shortness of breath, chest pain, edema, cough, abdominal pain, nausea, vomiting, diarrhea, dysuria, fevers, rashes or hallucinations unless otherwise stated above in HPI. ____________________________________________   PHYSICAL EXAM:  VITAL SIGNS: Vitals:   06/19/16 1258  BP: 127/88  Pulse: 86  Resp: 20  Temp: 98.9 F (37.2 C)    Constitutional: Alert and oriented. Well appearing and in no acute distress. Eyes: Conjunctivae are normal. PERRL. EOMI. Head: Atraumatic. Nose: No congestion/rhinnorhea. Mouth/Throat: Mucous membranes are moist.  Oropharynx non-erythematous. Neck: No stridor. Painless ROM. No cervical spine tenderness to palpation Hematological/Lymphatic/Immunilogical: No cervical lymphadenopathy. Cardiovascular: Normal rate, regular rhythm. Grossly normal heart sounds.  Good peripheral circulation. Respiratory: Normal respiratory effort.  No retractions. Lungs CTAB. Gastrointestinal: Soft and nontender. No distention. No abdominal bruits. No CVA tenderness.  No hernia Musculoskeletal: No lower extremity tenderness nor edema.  No joint effusions. Neurologic:  Normal speech and language. No gross focal neurologic deficits are appreciated. No gait instability. Skin:  Skin is  warm, dry and intact. No rash noted. Psychiatric: Mood and affect are normal. Speech and behavior are normal.  ____________________________________________   LABS (all labs ordered are listed, but only abnormal results are displayed)  Results for orders placed or performed during the hospital encounter of 06/19/16 (from the past  24 hour(s))  Lipase, blood     Status: None   Collection Time: 06/19/16  1:07 PM  Result Value Ref Range   Lipase 24 11 - 51 U/L  Comprehensive metabolic panel     Status: Abnormal   Collection Time: 06/19/16  1:07 PM  Result Value Ref Range   Sodium 135 135 - 145 mmol/L   Potassium 3.7 3.5 - 5.1 mmol/L   Chloride 104 101 - 111 mmol/L   CO2 23 22 - 32 mmol/L   Glucose, Bld 143 (H) 65 - 99 mg/dL   BUN 8 6 - 20 mg/dL   Creatinine, Ser 1.61 0.61 - 1.24 mg/dL   Calcium 9.3 8.9 - 09.6 mg/dL   Total Protein 7.5 6.5 - 8.1 g/dL   Albumin 3.9 3.5 - 5.0 g/dL   AST 30 15 - 41 U/L   ALT 13 (L) 17 - 63 U/L   Alkaline Phosphatase 54 38 - 126 U/L   Total Bilirubin 1.2 0.3 - 1.2 mg/dL   GFR calc non Af Amer >60 >60 mL/min   GFR calc Af Amer >60 >60 mL/min   Anion gap 8 5 - 15  CBC     Status: Abnormal   Collection Time: 06/19/16  1:07 PM  Result Value Ref Range   WBC 8.5 3.8 - 10.6 K/uL   RBC 4.89 4.40 - 5.90 MIL/uL   Hemoglobin 15.5 13.0 - 18.0 g/dL   HCT 04.5 40.9 - 81.1 %   MCV 91.7 80.0 - 100.0 fL   MCH 31.6 26.0 - 34.0 pg   MCHC 34.5 32.0 - 36.0 g/dL   RDW 91.4 (H) 78.2 - 95.6 %   Platelets 265 150 - 440 K/uL  Troponin I     Status: None   Collection Time: 06/19/16  1:07 PM  Result Value Ref Range   Troponin I <0.03 <0.03 ng/mL  Urinalysis, Complete w Microscopic     Status: Abnormal   Collection Time: 06/19/16  1:08 PM  Result Value Ref Range   Color, Urine AMBER (A) YELLOW   APPearance CLOUDY (A) CLEAR   Specific Gravity, Urine 1.019 1.005 - 1.030   pH 8.0 5.0 - 8.0   Glucose, UA NEGATIVE NEGATIVE mg/dL   Hgb urine dipstick NEGATIVE NEGATIVE   Bilirubin Urine NEGATIVE NEGATIVE   Ketones, ur NEGATIVE NEGATIVE mg/dL   Protein, ur NEGATIVE NEGATIVE mg/dL   Nitrite NEGATIVE NEGATIVE   Leukocytes, UA NEGATIVE NEGATIVE   RBC / HPF NONE SEEN 0 - 5 RBC/hpf   WBC, UA NONE SEEN 0 - 5 WBC/hpf   Bacteria, UA NONE SEEN NONE SEEN   Squamous Epithelial / LPF NONE SEEN NONE SEEN    Mucous PRESENT    Amorphous Crystal PRESENT    ____________________________________________  EKG My review and personal interpretation at Time: 13:15   Indication: epigastric  Rate: 85  Rhythm: sinus Axis: rightward Other: normal intervals, no st elevations ____________________________________________  RADIOLOGY  I personally reviewed all radiographic images ordered to evaluate for the above acute complaints and reviewed radiology reports and findings.  These findings were personally discussed with the patient.  Please see medical record for radiology report.  ____________________________________________  PROCEDURES  Procedure(s) performed:  Procedures    Critical Care performed: no ____________________________________________   INITIAL IMPRESSION / ASSESSMENT AND PLAN / ED COURSE  Pertinent labs & imaging results that were available during my care of the patient were reviewed by me and considered in my medical decision making (see chart for details).  DDX: enteritis, diverticulitis, colitis, IBD, dehydration, hernia, gastritis  ZACHAREE GADDIE is a 36 y.o. who presents to the ED with multiple complaints as described above.  Patient is AFVSS in ED. Exam as above. Given current presentation have considered the above differential.  Patient otherwise well appearing and his abdominal exam is soft and benign. Blood work is very reassuring. He has no laboratory evidence of acute pancreatitis, hepatitis, biliary pathology or acute infectious process. He has no findings to suggest hernia. Chest x-ray showed  no evidence of infiltrate or mass. He is low risk by well's and perc negative.     ----------------------------------------- 3:21 PM on 06/19/2016 -----------------------------------------  Repeat abdominal exam is soft and benign. X-ray shows no evidence of obstructive pattern. Possible left ureterolithiasis but history is less consistent with kidney stone. Urine analysis shows  no evidence of hematuria. Most likely nonspecific enteritis. Do not feel that emergent CT imaging is clinically indicated at this point. I discussed strict return precautions and provided referral for primary care.  Have discussed with the patient and available family all diagnostics and treatments performed thus far and all questions were answered to the best of my ability. The patient demonstrates understanding and agreement with plan.   ____________________________________________   FINAL CLINICAL IMPRESSION(S) / ED DIAGNOSES  Final diagnoses:  LLQ abdominal pain  Diarrhea, unspecified type      NEW MEDICATIONS STARTED DURING THIS VISIT:  New Prescriptions   No medications on file     Note:  This document was prepared using Dragon voice recognition software and may include unintentional dictation errors.    Willy Eddy, MD 06/19/16 [phone]

## 2016-06-19 NOTE — ED Notes (Signed)
Pt discharged home after verbalizing understanding of discharge instructions; nad noted. 

## 2016-11-12 ENCOUNTER — Encounter: Payer: Self-pay | Admitting: *Deleted

## 2016-11-12 ENCOUNTER — Emergency Department
Admission: EM | Admit: 2016-11-12 | Discharge: 2016-11-12 | Disposition: A | Discharge status: Home / Self Care | Payer: Self-pay | Attending: Emergency Medicine | Admitting: Emergency Medicine

## 2016-11-12 DIAGNOSIS — F17210 Nicotine dependence, cigarettes, uncomplicated: Secondary | ICD-10-CM | POA: Insufficient documentation

## 2016-11-12 DIAGNOSIS — Z79899 Other long term (current) drug therapy: Secondary | ICD-10-CM | POA: Insufficient documentation

## 2016-11-12 DIAGNOSIS — R112 Nausea with vomiting, unspecified: Secondary | ICD-10-CM | POA: Insufficient documentation

## 2016-11-12 DIAGNOSIS — K529 Noninfective gastroenteritis and colitis, unspecified: Secondary | ICD-10-CM | POA: Insufficient documentation

## 2016-11-12 DIAGNOSIS — R197 Diarrhea, unspecified: Secondary | ICD-10-CM

## 2016-11-12 DIAGNOSIS — I10 Essential (primary) hypertension: Secondary | ICD-10-CM | POA: Insufficient documentation

## 2016-11-12 LAB — CBC
HCT: 47.4 % (ref 40.0–52.0)
Hemoglobin: 16.4 g/dL (ref 13.0–18.0)
MCH: 32.4 pg (ref 26.0–34.0)
MCHC: 34.7 g/dL (ref 32.0–36.0)
MCV: 93.4 fL (ref 80.0–100.0)
PLATELETS: 229 10*3/uL (ref 150–440)
RBC: 5.07 MIL/uL (ref 4.40–5.90)
RDW: 14 % (ref 11.5–14.5)
WBC: 15.6 10*3/uL — ABNORMAL HIGH (ref 3.8–10.6)

## 2016-11-12 LAB — COMPREHENSIVE METABOLIC PANEL
ALBUMIN: 4.6 g/dL (ref 3.5–5.0)
ALK PHOS: 55 U/L (ref 38–126)
ALT: 21 U/L (ref 17–63)
AST: 38 U/L (ref 15–41)
Anion gap: 12 (ref 5–15)
BILIRUBIN TOTAL: 1.1 mg/dL (ref 0.3–1.2)
BUN: 9 mg/dL (ref 6–20)
CALCIUM: 9.5 mg/dL (ref 8.9–10.3)
CO2: 22 mmol/L (ref 22–32)
CREATININE: 0.68 mg/dL (ref 0.61–1.24)
Chloride: 101 mmol/L (ref 101–111)
GFR calc non Af Amer: >60 mL/min (ref >60)
Glucose, Bld: 90 mg/dL (ref 65–99)
Potassium: 3.6 mmol/L (ref 3.5–5.1)
SODIUM: 135 mmol/L (ref 135–145)
Total Protein: 8.5 g/dL — ABNORMAL HIGH (ref 6.5–8.1)

## 2016-11-12 LAB — URINALYSIS, COMPLETE (UACMP) WITH MICROSCOPIC
Bacteria, UA: NONE SEEN
Bilirubin Urine: NEGATIVE
GLUCOSE, UA: NEGATIVE mg/dL
Hgb urine dipstick: NEGATIVE
Ketones, ur: 5 mg/dL — AB
Leukocytes, UA: NEGATIVE
Nitrite: NEGATIVE
PH: 6 (ref 5.0–8.0)
Protein, ur: 30 mg/dL — AB
SPECIFIC GRAVITY, URINE: 1.023 (ref 1.005–1.030)
Squamous Epithelial / LPF: NONE SEEN

## 2016-11-12 LAB — LIPASE, BLOOD: Lipase: 27 U/L (ref 11–51)

## 2016-11-12 MED ORDER — FAMOTIDINE 20 MG PO TABS
20.0000 mg | ORAL_TABLET | Freq: Once | ORAL | Status: AC
Start: 2016-11-12 — End: 2016-11-12
  Administered 2016-11-12: 20 mg via ORAL
  Filled 2016-11-12: qty 1

## 2016-11-12 MED ORDER — ONDANSETRON HCL 4 MG PO TABS: 4.0000 mg | ORAL_TABLET | Freq: Three times a day (TID) | ORAL | 0 refills | Status: DC | PRN

## 2016-11-12 MED ORDER — FAMOTIDINE 20 MG PO TABS: 20.0000 mg | ORAL_TABLET | Freq: Two times a day (BID) | ORAL | 1 refills | Status: DC

## 2016-11-12 MED ORDER — GI COCKTAIL ~~LOC~~
30.0000 mL | Freq: Once | ORAL | Status: AC
  Administered 2016-11-12: 30 mL via ORAL
  Filled 2016-11-12: qty 30

## 2016-11-12 MED ORDER — ALUM & MAG HYDROXIDE-SIMETH 400-400-40 MG/5ML PO SUSP: 5.0000 mL | Freq: Four times a day (QID) | ORAL | 0 refills | Status: DC | PRN

## 2016-11-12 MED ORDER — ONDANSETRON 4 MG PO TBDP
4.0000 mg | ORAL_TABLET | Freq: Once | ORAL | Status: AC
  Administered 2016-11-12: 4 mg via ORAL
  Filled 2016-11-12: qty 1

## 2016-11-12 NOTE — Discharge Instructions (Signed)

## 2016-11-12 NOTE — ED Provider Notes (Signed)
[facility] Emergency Department Provider Note  ____________________________________________  Time seen: Approximately 2:39 PM  I have reviewed the triage vital signs and the nursing notes.   HISTORY  Chief Complaint Abdominal Pain and Diarrhea   HPI John [name] is a 36 y.o. male with a history of H. pylori, hypertension, kidney stones and presents for evaluation of 2 days N/V/D. 2-3 episodes each a day. No blood or melena. No fever but has had chills. Central cramping intermittent severe abdominal pain that resolves after a BM. NO pain at this time. No prior abdominal surgeries. no chest pain or shortness of breath, no dysuria or hematuria, no constipation or diarrhea. The patient is concerned about recurrent H. pylori infection.  Past Medical History:  Diagnosis Date  . ASD (atrial septal defect)   . Bacterial infection due to H. pylori   . Hypertension   . Kidney stones     There are no active problems to display for this patient.   Past Surgical History:  Procedure Laterality Date  . ASD REPAIR    . FOOT SURGERY Right   . KNEE SURGERY Right     Prior to Admission medications   Medication Sig Start Date End Date Taking? Authorizing Provider  alum & mag hydroxide-simeth (MAALOX MAX) 400-400-40 MG/5ML suspension Take 5 mLs by mouth every 6 (six) hours as needed for indigestion. 11/12/16   Nita Sickle, MD  cyclobenzaprine (FLEXERIL) 5 MG tablet Take 1 tablet (5 mg total) by mouth every 8 (eight) hours as needed for muscle spasms. 09/22/14   Menshew, Charlesetta Ivory, PA-C  dicyclomine (BENTYL) 10 MG capsule Take 1 capsule (10 mg total) by mouth 3 (three) times daily as needed for spasms. 06/19/16 07/03/16  Willy Eddy, MD  famotidine (PEPCID) 20 MG tablet Take 1 tablet (20 mg total) by mouth 2 (two) times daily. 11/12/16 11/12/17  Nita Sickle, MD  ibuprofen (ADVIL,MOTRIN) 800 MG tablet Take 1 tablet (800 mg total) by mouth every 8  (eight) hours as needed. 06/04/16   Emily Filbert, MD  ketorolac (TORADOL) 10 MG tablet Take 1 tablet (10 mg total) by mouth every 8 (eight) hours. 09/22/14   Menshew, Charlesetta Ivory, PA-C  ondansetron (ZOFRAN) 4 MG tablet Take 1 tablet (4 mg total) by mouth every 8 (eight) hours as needed for nausea or vomiting. 11/12/16   Don Perking, [place], MD  oxyCODONE-acetaminophen (ROXICET) 5-325 MG tablet Take 1 tablet by mouth every 6 (six) hours as needed for moderate pain. 06/04/16   Joni Reining, PA-C  promethazine (PHENERGAN) 12.5 MG tablet Take 1 tablet (12.5 mg total) by mouth every 6 (six) hours as needed for nausea or vomiting. 06/19/16   Willy Eddy, MD  ranitidine (ZANTAC) 150 MG tablet Take 1 tablet (150 mg total) by mouth 2 (two) times daily. 06/19/16 06/19/17  Willy Eddy, MD    Allergies Penicillins  History reviewed. No pertinent family history.  Social History Social History  Substance Use Topics  . Smoking status: Current Every Day Smoker    Packs/day: 0.50    Types: Cigarettes  . Smokeless tobacco: Never Used  . Alcohol use Yes     Comment: occasional    Review of Systems  Constitutional: Negative for fever. Eyes: Negative for visual changes. ENT: Negative for sore throat. Neck: No neck pain  Cardiovascular: Negative for chest pain. Respiratory: Negative for shortness of breath. Gastrointestinal: + diffuse cramping abdominal pain, vomiting and diarrhea. Genitourinary: Negative for dysuria. Musculoskeletal:  Negative for back pain. Skin: Negative for rash. Neurological: Negative for headaches, weakness or numbness. Psych: No SI or HI  ____________________________________________   PHYSICAL EXAM:  VITAL SIGNS: ED Triage Vitals  Enc Vitals Group     BP 11/12/16 1151 130/85     Pulse Rate 11/12/16 1151 84     Resp 11/12/16 1151 16     Temp 11/12/16 1151 98.6 F (37 C)     Temp Source 11/12/16 1151 Oral     SpO2 11/12/16 1151 98 %     Weight  11/12/16 1151 160 lb (72.6 kg)     Height 11/12/16 1151  (1.778 m)     Head Circumference --      Peak Flow --      Pain Score 11/12/16 1426 6     Pain Loc --      Pain Edu? --      Excl. in GC? --     Constitutional: Alert and oriented. Well appearing and in no apparent distress. HEENT:      Head: Normocephalic and atraumatic.         Eyes: Conjunctivae are normal. Sclera is non-icteric.       Mouth/Throat: Mucous membranes are moist.       Neck: Supple with no signs of meningismus. Cardiovascular: Regular rate and rhythm. No murmurs, gallops, or rubs. 2+ symmetrical distal pulses are present in all extremities. No JVD. Respiratory: Normal respiratory effort. Lungs are clear to auscultation bilaterally. No wheezes, crackles, or rhonchi.  Gastrointestinal: Soft, non tender, and non distended with positive bowel sounds. No rebound or guarding. Genitourinary: No CVA tenderness. Musculoskeletal: Nontender with normal range of motion in all extremities. No edema, cyanosis, or erythema of extremities. Neurologic: Normal speech and language. Face is symmetric. Moving all extremities. No gross focal neurologic deficits are appreciated. Skin: Skin is warm, dry and intact. No rash noted. Psychiatric: Mood and affect are normal. Speech and behavior are normal.  ____________________________________________   LABS (all labs ordered are listed, but only abnormal results are displayed)  Labs Reviewed  COMPREHENSIVE METABOLIC PANEL - Abnormal; Notable for the following:       Result Value   Total Protein 8.5 (*)    All other components within normal limits  CBC - Abnormal; Notable for the following:    WBC 15.6 (*)    All other components within normal limits  URINALYSIS, COMPLETE (UACMP) WITH MICROSCOPIC - Abnormal; Notable for the following:    Color, Urine AMBER (*)    APPearance CLEAR (*)    Ketones, ur 5 (*)    Protein, ur 30 (*)    All other components within normal limits    LIPASE, BLOOD   ____________________________________________  EKG  none  ____________________________________________  RADIOLOGY  none  ____________________________________________   PROCEDURES  Procedure(s) performed: None Procedures Critical Care performed:  None ____________________________________________   INITIAL IMPRESSION / ASSESSMENT AND PLAN / ED COURSE  36 y.o. male with a history of H. pylori, hypertension, kidney stones and presents for evaluation of 2 days N/V/Dand intermittent crampy abdominal pain. Patient is extremely well appearing, no distress, is normal vital signs, his abdomen is soft with no tenderness throughout. Presentation concerning for viral gastroenteritis. Patient will be given Zofran, Pepcid, and Maalox and is going to be discharged home on these prescriptions. Recommend increased oral hydration. Discussed return precautions if the pain returns and localizes to the right lower quadrant or right upper quadrant, if he develops a fever,  if he is unable to hydrate himself orally. Otherwise patient can follow-up with primary care doctor.     Pertinent labs & imaging results that were available during my care of the patient were reviewed by me and considered in my medical decision making (see chart for details).    ____________________________________________   FINAL CLINICAL IMPRESSION(S) / ED DIAGNOSES  Final diagnoses:  Nausea vomiting and diarrhea  Gastroenteritis      NEW MEDICATIONS STARTED DURING THIS VISIT:  New Prescriptions   ALUM & MAG HYDROXIDE-SIMETH (MAALOX MAX) 400-400-40 MG/5ML SUSPENSION    Take 5 mLs by mouth every 6 (six) hours as needed for indigestion.   FAMOTIDINE (PEPCID) 20 MG TABLET    Take 1 tablet (20 mg total) by mouth 2 (two) times daily.   ONDANSETRON (ZOFRAN) 4 MG TABLET    Take 1 tablet (4 mg total) by mouth every 8 (eight) hours as needed for nausea or vomiting.     Note:  This document was prepared  using Dragon voice recognition software and may include unintentional dictation errors.    Don Perking, [place], MD 11/12/16 [phone]

## 2016-11-12 NOTE — ED Notes (Signed)
Pt interrupting this RN while assessment is in process.   Pt state hx of H. Pylori in 2007.  Pt states abd pain and N&V. States hot and cold but will not state if he's had a temperature or not.

## 2016-11-12 NOTE — ED Triage Notes (Signed)
PT to ED reporting lower abd pain x 1 week that has worsened over the past 2 days. PT reports having NVD. Pt denies having checked temp at home but reports having cold chills intermittently. Hx of H. Pylori and reports feeling similar to this.

## 2017-06-03 ENCOUNTER — Other Ambulatory Visit: Payer: Self-pay

## 2017-06-03 ENCOUNTER — Emergency Department: Payer: Self-pay

## 2017-06-03 ENCOUNTER — Encounter: Payer: Self-pay | Admitting: Emergency Medicine

## 2017-06-03 ENCOUNTER — Emergency Department
Admission: EM | Admit: 2017-06-03 | Discharge: 2017-06-03 | Disposition: A | Discharge status: Home / Self Care | Payer: Self-pay | Attending: Emergency Medicine | Admitting: Emergency Medicine

## 2017-06-03 DIAGNOSIS — J209 Acute bronchitis, unspecified: Secondary | ICD-10-CM | POA: Insufficient documentation

## 2017-06-03 DIAGNOSIS — F17210 Nicotine dependence, cigarettes, uncomplicated: Secondary | ICD-10-CM | POA: Insufficient documentation

## 2017-06-03 DIAGNOSIS — I10 Essential (primary) hypertension: Secondary | ICD-10-CM | POA: Insufficient documentation

## 2017-06-03 MED ORDER — PSEUDOEPH-BROMPHEN-DM 30-2-10 MG/5ML PO SYRP: 5.0000 mL | ORAL_SOLUTION | Freq: Four times a day (QID) | ORAL | 0 refills | Status: DC | PRN

## 2017-06-03 MED ORDER — PREDNISONE 10 MG PO TABS: ORAL_TABLET | ORAL | 0 refills | Status: DC

## 2017-06-03 MED ORDER — SULFAMETHOXAZOLE-TRIMETHOPRIM 800-160 MG PO TABS: 1.0000 | ORAL_TABLET | Freq: Two times a day (BID) | ORAL | 0 refills | Status: DC

## 2017-06-03 NOTE — Discharge Instructions (Signed)
Follow-up with [facility] or [facility] if any continued problems.  Begin taking medication only as directed.  Bromfed-DM 4 times a day as needed for cough and congestion. Prednisone beginning with 6 tablets and taper down over the next 6 days.  Bactrim DS twice daily for 10 days.  Discontinue or decrease smoking.  Increase fluids.  Also consider taking Zyrtec or Claritin for seasonal allergies as these 2 do not cause drowsiness.

## 2017-06-03 NOTE — ED Provider Notes (Signed)
[facility] Emergency Department Provider Note  ___________________________________________   First MD Initiated Contact with Patient 06/03/17 [phone]     (approximate)  I have reviewed the triage vital signs and the nursing notes.   HISTORY  Chief Complaint Headache  HPI John [name] is a 37 y.o. male complaint of headache, fever, body aches and nausea for 1 day.  Patient also has had some upper respiratory issues and has been coughing.  Patient continues to smoke cigarettes daily.  He has not been taking any over-the-counter medication.  He complains of seasonal allergies.  He currently rates his pain is 6/10.  Past Medical History:  Diagnosis Date  . ASD (atrial septal defect)   . Bacterial infection due to H. pylori   . Hypertension   . Kidney stones     There are no active problems to display for this patient.   Past Surgical History:  Procedure Laterality Date  . ASD REPAIR    . FOOT SURGERY Right   . KNEE SURGERY Right     Prior to Admission medications   Medication Sig Start Date End Date Taking? Authorizing Provider  brompheniramine-pseudoephedrine-DM 30-2-10 MG/5ML syrup Take 5 mLs by mouth 4 (four) times daily as needed. 06/03/17   Tommi Rumps, PA-C  oxyCODONE-acetaminophen (ROXICET) 5-325 MG tablet Take 1 tablet by mouth every 6 (six) hours as needed for moderate pain. 06/04/16   Joni Reining, PA-C  predniSONE (DELTASONE) 10 MG tablet Take 6 tablets  today, on day 2 take 5 tablets, day 3 take 4 tablets, day 4 take 3 tablets, day 5 take  2 tablets and 1 tablet the last day 06/03/17   Tommi Rumps, PA-C  sulfamethoxazole-trimethoprim (BACTRIM DS,SEPTRA DS) 800-160 MG tablet Take 1 tablet by mouth 2 (two) times daily. 06/03/17   Tommi Rumps, PA-C    Allergies Penicillins  No family history on file.  Social History Social History   Tobacco Use  . Smoking status: Current Every Day Smoker    Packs/day: 0.50   Types: Cigarettes  . Smokeless tobacco: Never Used  Substance Use Topics  . Alcohol use: Yes    Comment: occasional  . Drug use: No    Review of Systems Constitutional: No fever/chills Eyes: No visual changes. ENT: Positive for nasal congestion. Cardiovascular: Denies chest pain. Respiratory: Denies shortness of breath.  Positive for cough. Gastrointestinal: No abdominal pain.  Positive nausea, no vomiting.  No diarrhea.   Genitourinary: Negative for dysuria. Musculoskeletal: Positive for body aches. Skin: Negative for rash. Neurological: Positive for headaches, no focal weakness or numbness. ____________________________________________   PHYSICAL EXAM:  VITAL SIGNS: ED Triage Vitals [06/03/17 0850]  Enc Vitals Group     BP 121/68     Pulse Rate (!) 104     Resp 18     Temp 98.8 F (37.1 C)     Temp Source Oral     SpO2 96 %     Weight      Height      Head Circumference      Peak Flow      Pain Score 6     Pain Loc      Pain Edu?      Excl. in GC?    Constitutional: Alert and oriented. Well appearing and in no acute distress. Eyes: Conjunctivae are normal.  Head: Atraumatic. Nose: Moderate congestion/rhinnorhea.  TMs are dull bilaterally. Mouth/Throat: Mucous membranes are moist.  Oropharynx non-erythematous. Neck:  No stridor.   Hematological/Lymphatic/Immunilogical: No cervical lymphadenopathy. Cardiovascular: Normal rate, regular rhythm. Grossly normal heart sounds.  Good peripheral circulation. Respiratory: Normal respiratory effort.  No retractions. Lungs bilateral expiratory wheeze and coarse congested cough is noted throughout.  Patient is in no respiratory distress and is able to talk in complete sentences without any difficulty. Gastrointestinal: Soft and nontender. No distention.  Musculoskeletal: Moves upper and lower extremities without any difficulty and normal gait was noted. Neurologic:  Normal speech and language. No gross focal neurologic  deficits are appreciated. No gait instability. Skin:  Skin is warm, dry and intact. No rash noted. Psychiatric: Mood and affect are normal. Speech and behavior are normal.  ____________________________________________   LABS (all labs ordered are listed, but only abnormal results are displayed)  Labs Reviewed - No data to display  RADIOLOGY  ED MD interpretation:  Chest x-ray negative for pneumonia.  Official radiology report(s): Dg Chest 2 View  Result Date: 06/03/2017 CLINICAL DATA:  Cough and fever EXAM: CHEST - 2 VIEW COMPARISON:  June 19, 2016 FINDINGS: Lungs are clear. The heart size and pulmonary vascularity are normal. No adenopathy. There is an old healed fracture of the right clavicle. There is mild anterior wedging of the T12 vertebral body, stable. IMPRESSION: No edema or consolidation. Electronically Signed   By: Bretta BangWilliam  Woodruff III M.D.   On: 06/03/2017 10:43    ____________________________________________   PROCEDURES  Procedure(s) performed: None  Procedures  Critical Care performed: No  ____________________________________________   INITIAL IMPRESSION / ASSESSMENT AND PLAN / ED COURSE  Patient is here with viral upper respiratory symptoms.  Chest x-ray was reassuring that there was no pneumonia.  Patient was placed on prednisone, Bromfed-DM and Bactrim DS twice daily for 10 days.  Patient is encouraged to discontinue smoking and increase fluids.  He will take Tylenol or ibuprofen as needed for body aches or fever.  He is to follow-up with his PCP or [facility] if any continued problems.  ____________________________________________   FINAL CLINICAL IMPRESSION(S) / ED DIAGNOSES  Final diagnoses:  Acute bronchitis, unspecified organism  Cigarette smoker     ED Discharge Orders        Ordered    predniSONE (DELTASONE) 10 MG tablet     06/03/17 1051    brompheniramine-pseudoephedrine-DM 30-2-10 MG/5ML syrup  4 times daily PRN     06/03/17  1051    sulfamethoxazole-trimethoprim (BACTRIM DS,SEPTRA DS) 800-160 MG tablet  2 times daily     06/03/17 1051       Note:  This document was prepared using Dragon voice recognition software and may include unintentional dictation errors.    Tommi RumpsSummers, Rhonda L, PA-C 06/03/17 1538    Emily FilbertWilliams, Jonathan E, MD 06/03/17 [phone]

## 2017-06-03 NOTE — ED Notes (Signed)
See triage note  Presents with fever and headache since last pm  Sates fever at home was 101  But afebrile on arrival  Also has had cough

## 2017-06-03 NOTE — ED Triage Notes (Signed)
Headache , fever, body aches , nausea x1 day

## 2017-06-03 NOTE — ED Notes (Signed)
States he is stressed and is worried about not being able to pick up his son  Encouraged to relax

## 2017-06-25 ENCOUNTER — Emergency Department
Admission: EM | Admit: 2017-06-25 | Discharge: 2017-06-25 | Disposition: A | Discharge status: Home / Self Care | Payer: Self-pay | Attending: Emergency Medicine | Admitting: Emergency Medicine

## 2017-06-25 ENCOUNTER — Other Ambulatory Visit: Payer: Self-pay

## 2017-06-25 DIAGNOSIS — I10 Essential (primary) hypertension: Secondary | ICD-10-CM | POA: Insufficient documentation

## 2017-06-25 DIAGNOSIS — F17210 Nicotine dependence, cigarettes, uncomplicated: Secondary | ICD-10-CM | POA: Insufficient documentation

## 2017-06-25 DIAGNOSIS — A55 Chlamydial lymphogranuloma (venereum): Secondary | ICD-10-CM | POA: Insufficient documentation

## 2017-06-25 MED ORDER — DOXYCYCLINE HYCLATE 100 MG PO CAPS: 100.0000 mg | ORAL_CAPSULE | Freq: Two times a day (BID) | ORAL | 0 refills | Status: AC

## 2017-06-25 NOTE — ED Triage Notes (Addendum)
Pt comes via POV with c/o abcess on left lower side. Pt states today it is swollen more than yesterday and is more like a knot. Pt states pain but denies discharge and no redness. Denies fever, chills, N/V.

## 2017-06-25 NOTE — ED Provider Notes (Signed)
[facility] Emergency Department Provider Note  ____________________________________________  Time seen: Approximately 9:14 PM  I have reviewed the triage vital signs and the nursing notes.   HISTORY  Chief Complaint Abscess    HPI John [name] is a 37 y.o. male presents to the emergency department with 3 cm of erythema and edema along left groin that started today.  Patient reports that symptoms do not change with a supine or standing position.  Patient is accompanied by his girlfriend who recently underwent STD testing.  He denies discharge from the penis or dysuria.  He has been afebrile.  He denies new sexual partners.  No alleviating measures have been attempted.   Past Medical History:  Diagnosis Date  . ASD (atrial septal defect)   . Bacterial infection due to H. pylori   . Hypertension   . Kidney stones     There are no active problems to display for this patient.   Past Surgical History:  Procedure Laterality Date  . ASD REPAIR    . FOOT SURGERY Right   . KNEE SURGERY Right     Prior to Admission medications   Medication Sig Start Date End Date Taking? Authorizing Provider  brompheniramine-pseudoephedrine-DM 30-2-10 MG/5ML syrup Take 5 mLs by mouth 4 (four) times daily as needed. 06/03/17   Tommi Rumps, PA-C  doxycycline (VIBRAMYCIN) 100 MG capsule Take 1 capsule (100 mg total) by mouth 2 (two) times daily for 21 days. 06/25/17 07/16/17  Orvil Feil, PA-C  oxyCODONE-acetaminophen (ROXICET) 5-325 MG tablet Take 1 tablet by mouth every 6 (six) hours as needed for moderate pain. 06/04/16   Joni Reining, PA-C  predniSONE (DELTASONE) 10 MG tablet Take 6 tablets  today, on day 2 take 5 tablets, day 3 take 4 tablets, day 4 take 3 tablets, day 5 take  2 tablets and 1 tablet the last day 06/03/17   Tommi Rumps, PA-C  sulfamethoxazole-trimethoprim (BACTRIM DS,SEPTRA DS) 800-160 MG tablet Take 1 tablet by mouth 2 (two) times daily.  06/03/17   Tommi Rumps, PA-C    Allergies Penicillins  No family history on file.  Social History Social History   Tobacco Use  . Smoking status: Current Every Day Smoker    Packs/day: 0.50    Types: Cigarettes  . Smokeless tobacco: Never Used  Substance Use Topics  . Alcohol use: Yes    Comment: occasional  . Drug use: No     Review of Systems  Constitutional: No fever/chills Eyes: No visual changes. No discharge ENT: No upper respiratory complaints. Cardiovascular: no chest pain. Respiratory: no cough. No SOB. Gastrointestinal: No abdominal pain.  No nausea, no vomiting.  No diarrhea.  No constipation. Genitourinary: Negative for dysuria. No hematuria Musculoskeletal: Negative for musculoskeletal pain. Skin: Patient has erythema and edema of left groin. Neurological: Negative for headaches, focal weakness or numbness.  ____________________________________________   PHYSICAL EXAM:  VITAL SIGNS: ED Triage Vitals  Enc Vitals Group     BP 06/25/17 2102 140/78     Pulse Rate 06/25/17 2102 98     Resp 06/25/17 2102 18     Temp 06/25/17 2102 98.8 F (37.1 C)     Temp Source 06/25/17 2102 Oral     SpO2 06/25/17 2102 99 %     Weight 06/25/17 2102 169 lb (76.7 kg)     Height 06/25/17 2102  (1.753 m)     Head Circumference --      Peak  Flow --      Pain Score 06/25/17 2103 7     Pain Loc --      Pain Edu? --      Excl. in GC? --      Constitutional: Alert and oriented. Well appearing and in no acute distress. Eyes: Conjunctivae are normal. PERRL. EOMI. Head: Atraumatic. Cardiovascular: Normal rate, regular rhythm. Normal S1 and S2.  Good peripheral circulation. Respiratory: Normal respiratory effort without tachypnea or retractions. Lungs CTAB. Good air entry to the bases with no decreased or absent breath sounds. Gastrointestinal: Bowel sounds 4 quadrants. Soft and nontender to palpation. No guarding or rigidity. No palpable masses. No distention.  No CVA tenderness.  Patient has approximately 3 cm of erythema and edema along left groin.  Edema is soft with no palpable induration.  No change in physical exam findings with standing or supine position. Musculoskeletal: Full range of motion to all extremities. No gross deformities appreciated. Neurologic:  Normal speech and language. No gross focal neurologic deficits are appreciated.  Skin:  Skin is warm, dry and intact. No rash noted.  ____________________________________________   LABS (all labs ordered are listed, but only abnormal results are displayed)  Labs Reviewed - No data to display ____________________________________________  EKG   ____________________________________________  RADIOLOGY  No results found.  ____________________________________________    PROCEDURES  Procedure(s) performed:    Procedures    Medications - No data to display   ____________________________________________   INITIAL IMPRESSION / ASSESSMENT AND PLAN / ED COURSE  Pertinent labs & imaging results that were available during my care of the patient were reviewed by me and considered in my medical decision making (see chart for details).  Review of the [place] CSRS was performed in accordance of the [facility] prior to dispensing any controlled drugs.    Assessment and plan Lymphogranuloma venereum Patient presents to the emergency department with left-sided groin erythema and edema.  Differential diagnosis included cellulitis, abscess and lymphogranuloma venereum.  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  Patient was evaluated by myself as well as Cranston Neighbor, PA-C.  Patient was discharged with doxycycline and advised to return to the emergency department if symptoms do not improve.  Vital signs are reassuring prior to discharge.    ____________________________________________  FINAL CLINICAL IMPRESSION(S) / ED DIAGNOSES  Final diagnoses:   Lymphogranuloma venereum      NEW MEDICATIONS STARTED DURING THIS VISIT:  ED Discharge Orders        Ordered    doxycycline (VIBRAMYCIN) 100 MG capsule  2 times daily     06/25/17 2249          This chart was dictated using voice recognition software/Dragon. Despite best efforts to proofread, errors can occur which can change the meaning. Any change was purely unintentional.    Orvil Feil, PA-C 06/25/17 2318    Myrna Blazer, MD 06/26/17 Marlyne Beards

## 2017-06-25 NOTE — ED Notes (Signed)
Pt to the er for pain and swelling to the left side inguinal area. Pt has a large palpable mass to the left side of the inguinal area. Tender with palpation. Pt reports lifting a trailer hitch the other day. Area just popped up. No drainage or redness noted

## 2017-07-27 ENCOUNTER — Other Ambulatory Visit: Payer: Self-pay

## 2017-07-27 ENCOUNTER — Encounter: Payer: Self-pay | Admitting: Emergency Medicine

## 2017-07-27 ENCOUNTER — Emergency Department
Admission: EM | Admit: 2017-07-27 | Discharge: 2017-07-27 | Disposition: A | Discharge status: Home / Self Care | Payer: Self-pay | Attending: Emergency Medicine | Admitting: Emergency Medicine

## 2017-07-27 DIAGNOSIS — I10 Essential (primary) hypertension: Secondary | ICD-10-CM | POA: Insufficient documentation

## 2017-07-27 DIAGNOSIS — F17210 Nicotine dependence, cigarettes, uncomplicated: Secondary | ICD-10-CM | POA: Insufficient documentation

## 2017-07-27 DIAGNOSIS — F10220 Alcohol dependence with intoxication, uncomplicated: Secondary | ICD-10-CM

## 2017-07-27 DIAGNOSIS — F10229 Alcohol dependence with intoxication, unspecified: Secondary | ICD-10-CM | POA: Insufficient documentation

## 2017-07-27 LAB — COMPREHENSIVE METABOLIC PANEL
ALK PHOS: 54 U/L (ref 38–126)
ALT: 15 U/L — AB (ref 17–63)
AST: 28 U/L (ref 15–41)
Albumin: 4.1 g/dL (ref 3.5–5.0)
Anion gap: 13 (ref 5–15)
BILIRUBIN TOTAL: 0.5 mg/dL (ref 0.3–1.2)
BUN: 6 mg/dL (ref 6–20)
CO2: 21 mmol/L — ABNORMAL LOW (ref 22–32)
CREATININE: 0.73 mg/dL (ref 0.61–1.24)
Calcium: 8.6 mg/dL — ABNORMAL LOW (ref 8.9–10.3)
Chloride: 106 mmol/L (ref 101–111)
GFR calc Af Amer: >60 mL/min (ref >60)
GFR calc non Af Amer: >60 mL/min (ref >60)
Glucose, Bld: 124 mg/dL — ABNORMAL HIGH (ref 65–99)
Potassium: 3.3 mmol/L — ABNORMAL LOW (ref 3.5–5.1)
Sodium: 140 mmol/L (ref 135–145)
TOTAL PROTEIN: 7.6 g/dL (ref 6.5–8.1)

## 2017-07-27 LAB — CBC
HEMATOCRIT: 48.7 % (ref 40.0–52.0)
Hemoglobin: 16.7 g/dL (ref 13.0–18.0)
MCH: 32.4 pg (ref 26.0–34.0)
MCHC: 34.2 g/dL (ref 32.0–36.0)
MCV: 94.7 fL (ref 80.0–100.0)
Platelets: 245 10*3/uL (ref 150–440)
RBC: 5.14 MIL/uL (ref 4.40–5.90)
RDW: 13.7 % (ref 11.5–14.5)
WBC: 8.4 10*3/uL (ref 3.8–10.6)

## 2017-07-27 LAB — ETHANOL: Alcohol, Ethyl (B): 265 mg/dL — ABNORMAL HIGH (ref <10)

## 2017-07-27 NOTE — Discharge Instructions (Signed)
You were seen in the emergency department for alcohol dependence.  It is your preference to leave at this time and you do have the ability to make your own decisions, but please seek help from the recommended resources for assistance with your alcohol dependence.  If you have any thoughts of hurting herself or others, please call 911 or return to the emergency department.  Please avoid drug and alcohol use.  Never drive a vehicle or operate machinery while intoxicated; you are being allowed to be discharged only because you have a sober family member who is coming to pick you up.  Please do not drink and drive.

## 2017-07-27 NOTE — ED Provider Notes (Addendum)
[facility] Emergency Department Provider Note  ____________________________________________   First MD Initiated Contact with Patient 07/27/17 1544     (approximate)  I have reviewed the triage vital signs and the nursing notes.   HISTORY  Chief Complaint Alcohol Problem    HPI John [name] is a 37 y.o. male with a history of chronic alcohol dependence who presents voluntarily for alcohol detox.  He states that he drinks heavily, both beer and liquor, every day, but he is able to keep a job and help take care of his family.  He recognizes that he needs help and he was looking for an inpatient treatment option.  However while he was waiting he was told by his boss that he could have a few days but could not be gone for a month or he will lose his job.  At the same time he also heard back from his son who was apparently ill and he needs to go to [place] so he is asking to go.  He is asking if we can just provide him with some contact information for some local resources.  He is very anxious to leave because of his ill son and apparently his father has only a limited time during which she can come pick him up from the ED.  The patient is ambulatory without any difficulty and is speaking clearly and coherently.  He denies SI and HI.  He denies fever/chills, chest pain, shortness of breath, nausea, vomiting, abdominal pain.  His dependence is severe and chronic.  Nothing particular makes it better or worse.  Past Medical History:  Diagnosis Date  . ASD (atrial septal defect)   . Bacterial infection due to H. pylori   . Hypertension   . Kidney stones     There are no active problems to display for this patient.   Past Surgical History:  Procedure Laterality Date  . ASD REPAIR    . FOOT SURGERY Right   . KNEE SURGERY Right     Prior to Admission medications   Medication Sig Start Date End Date Taking? Authorizing Provider    brompheniramine-pseudoephedrine-DM 30-2-10 MG/5ML syrup Take 5 mLs by mouth 4 (four) times daily as needed. 06/03/17   Tommi Rumps, PA-C  oxyCODONE-acetaminophen (ROXICET) 5-325 MG tablet Take 1 tablet by mouth every 6 (six) hours as needed for moderate pain. 06/04/16   Joni Reining, PA-C  predniSONE (DELTASONE) 10 MG tablet Take 6 tablets  today, on day 2 take 5 tablets, day 3 take 4 tablets, day 4 take 3 tablets, day 5 take  2 tablets and 1 tablet the last day 06/03/17   Tommi Rumps, PA-C  sulfamethoxazole-trimethoprim (BACTRIM DS,SEPTRA DS) 800-160 MG tablet Take 1 tablet by mouth 2 (two) times daily. 06/03/17   Tommi Rumps, PA-C    Allergies Penicillins  No family history on file.  Social History Social History   Tobacco Use  . Smoking status: Current Every Day Smoker    Packs/day: 0.50    Types: Cigarettes  . Smokeless tobacco: Never Used  Substance Use Topics  . Alcohol use: Yes    Comment: occasional  . Drug use: No    Review of Systems Constitutional: No fever/chills Eyes: No visual changes. ENT: No sore throat. Cardiovascular: Denies chest pain. Respiratory: Denies shortness of breath. Gastrointestinal: No abdominal pain.  No nausea, no vomiting.  No diarrhea.  No constipation. Genitourinary: Negative for dysuria. Musculoskeletal: Negative for neck pain.  Negative for back pain. Integumentary: Negative for rash. Neurological: Negative for headaches, focal weakness or numbness. Psychiatric:Alcohol dependence.  No SI or HI.  ____________________________________________   PHYSICAL EXAM:  VITAL SIGNS: ED Triage Vitals [07/27/17 1402]  Enc Vitals Group     BP 132/78     Pulse Rate (!) 108     Resp 16     Temp 98.2 F (36.8 C)     Temp Source Oral     SpO2 96 %     Weight 65.3 kg (144 lb)     Height 1.753 m (5\' 9" )     Head Circumference      Peak Flow      Pain Score 0     Pain Loc      Pain Edu?      Excl. in GC?      Constitutional: Alert and oriented. Well appearing and in no acute distress. Eyes: Conjunctivae are normal. PERRL. EOMI. Head: Atraumatic. Nose: No congestion/rhinnorhea. Mouth/Throat: Mucous membranes are moist. Neck: No stridor.  No meningeal signs.   Cardiovascular: Normal rate, regular rhythm. Good peripheral circulation. Grossly normal heart sounds. Respiratory: Normal respiratory effort.  No retractions. Lungs CTAB. Gastrointestinal: Soft and nontender. No distention.  Musculoskeletal: No lower extremity tenderness nor edema. No gross deformities of extremities. Neurologic:  Normal speech and language. No gross focal neurologic deficits are appreciated.  Skin:  Skin is warm, dry and intact. No rash noted. Psychiatric: Mood and affect are normal. Speech and behavior are normal.  Denies SI and HI  ____________________________________________   LABS (all labs ordered are listed, but only abnormal results are displayed)  Labs Reviewed  COMPREHENSIVE METABOLIC PANEL - Abnormal; Notable for the following components:      Result Value   Potassium 3.3 (*)    CO2 21 (*)    Glucose, Bld 124 (*)    Calcium 8.6 (*)    ALT 15 (*)    All other components within normal limits  ETHANOL - Abnormal; Notable for the following components:   Alcohol, Ethyl (B) 265 (*)    All other components within normal limits  CBC  URINE DRUG SCREEN, QUALITATIVE ([facility] ONLY)   ____________________________________________  EKG  None - EKG not ordered by ED physician ____________________________________________  RADIOLOGY   ED MD interpretation: No indication for imaging  Official radiology report(s): No results found.  ____________________________________________   PROCEDURES  Critical Care performed: No   Procedure(s) performed:   Procedures   ____________________________________________   INITIAL IMPRESSION / ASSESSMENT AND PLAN / ED COURSE  As part of my medical decision  making, I reviewed the following data within the electronic MEDICAL RECORD NUMBER Nursing notes reviewed and incorporated, Labs reviewed  and Notes from prior ED visits    Differential diagnosis includes, but is not limited to, alcohol dependence, mood disorder, alcohol intoxication, adjustment disorder, depression.  The patient seems to have good insight and judgment into his dependence and is exhibiting no concerning signs including but not limited to suicidal ideation or homicidal ideation.  He appears to have the capacity make his own decisions even though he has technically intoxicated by virtue of his ethanol level, and I suspect that he lives at an elevated state.  He is not clinically intoxicated, not slurring his words, ambulating without difficulty, making sense during our discussions.  CBC within normal limits, and CMP is generally reassuring although K+ is slightly low at 3.3.  He does not meet involuntary commitment criteria and  he has a sober adult who is coming to pick him up.  I have no basis by which and I do not know I did here but that to put him under involuntary commitment and he does not meet criteria for inpatient behavioral medicine treatment.  I provided information for him regarding residential treatment services of [facility] and [facility] in [place].  He is going to look into both of these options.  I gave my usual customary return precautions.     ____________________________________________  FINAL CLINICAL IMPRESSION(S) / ED DIAGNOSES  Final diagnoses:  Alcohol dependence with uncomplicated intoxication (HCC)     MEDICATIONS GIVEN DURING THIS VISIT:  Medications - No data to display   ED Discharge Orders    None       Note:  This document was prepared using Dragon voice recognition software and may include unintentional dictation errors.    Loleta RoseForbach, Jenasis Straley, MD 07/27/17 1554    Loleta RoseForbach, Ravonda Brecheen, MD 07/27/17 1555

## 2017-07-27 NOTE — ED Triage Notes (Signed)
Says wants detox.  Last drink this am.  Tried to stop by self but cant.

## 2018-01-14 ENCOUNTER — Encounter: Payer: Self-pay | Admitting: Emergency Medicine

## 2018-01-14 ENCOUNTER — Emergency Department
Admission: EM | Admit: 2018-01-14 | Discharge: 2018-01-14 | Disposition: A | Discharge status: Home / Self Care | Payer: Self-pay | Attending: Emergency Medicine | Admitting: Emergency Medicine

## 2018-01-14 DIAGNOSIS — Y9389 Activity, other specified: Secondary | ICD-10-CM | POA: Insufficient documentation

## 2018-01-14 DIAGNOSIS — Y929 Unspecified place or not applicable: Secondary | ICD-10-CM | POA: Insufficient documentation

## 2018-01-14 DIAGNOSIS — S61512A Laceration without foreign body of left wrist, initial encounter: Secondary | ICD-10-CM | POA: Insufficient documentation

## 2018-01-14 DIAGNOSIS — F1994 Other psychoactive substance use, unspecified with psychoactive substance-induced mood disorder: Secondary | ICD-10-CM

## 2018-01-14 DIAGNOSIS — F10920 Alcohol use, unspecified with intoxication, uncomplicated: Secondary | ICD-10-CM

## 2018-01-14 DIAGNOSIS — Y999 Unspecified external cause status: Secondary | ICD-10-CM | POA: Insufficient documentation

## 2018-01-14 DIAGNOSIS — I10 Essential (primary) hypertension: Secondary | ICD-10-CM | POA: Insufficient documentation

## 2018-01-14 DIAGNOSIS — W25XXXA Contact with sharp glass, initial encounter: Secondary | ICD-10-CM | POA: Insufficient documentation

## 2018-01-14 DIAGNOSIS — F329 Major depressive disorder, single episode, unspecified: Secondary | ICD-10-CM | POA: Insufficient documentation

## 2018-01-14 DIAGNOSIS — Z79899 Other long term (current) drug therapy: Secondary | ICD-10-CM | POA: Insufficient documentation

## 2018-01-14 DIAGNOSIS — F1010 Alcohol abuse, uncomplicated: Secondary | ICD-10-CM

## 2018-01-14 DIAGNOSIS — F17210 Nicotine dependence, cigarettes, uncomplicated: Secondary | ICD-10-CM | POA: Insufficient documentation

## 2018-01-14 LAB — COMPREHENSIVE METABOLIC PANEL
ALBUMIN: 4.1 g/dL (ref 3.5–5.0)
ALT: 21 U/L (ref 0–44)
AST: 30 U/L (ref 15–41)
Alkaline Phosphatase: 50 U/L (ref 38–126)
Anion gap: 10 (ref 5–15)
BUN: 6 mg/dL (ref 6–20)
CO2: 29 mmol/L (ref 22–32)
CREATININE: 1 mg/dL (ref 0.61–1.24)
Calcium: 8.5 mg/dL — ABNORMAL LOW (ref 8.9–10.3)
Chloride: 104 mmol/L (ref 98–111)
GFR calc Af Amer: >60 mL/min (ref >60)
Glucose, Bld: 115 mg/dL — ABNORMAL HIGH (ref 70–99)
Potassium: 3.6 mmol/L (ref 3.5–5.1)
Sodium: 143 mmol/L (ref 135–145)
Total Bilirubin: 0.7 mg/dL (ref 0.3–1.2)
Total Protein: 7 g/dL (ref 6.5–8.1)

## 2018-01-14 LAB — URINE DRUG SCREEN, QUALITATIVE (ARMC ONLY)
AMPHETAMINES, UR SCREEN: NOT DETECTED
BENZODIAZEPINE, UR SCRN: NOT DETECTED
Barbiturates, Ur Screen: NOT DETECTED
Cannabinoid 50 Ng, Ur ~~LOC~~: POSITIVE — AB
Cocaine Metabolite,Ur ~~LOC~~: NOT DETECTED
MDMA (Ecstasy)Ur Screen: NOT DETECTED
Methadone Scn, Ur: NOT DETECTED
OPIATE, UR SCREEN: NOT DETECTED
PHENCYCLIDINE (PCP) UR S: NOT DETECTED
Tricyclic, Ur Screen: NOT DETECTED

## 2018-01-14 LAB — CBC
HEMATOCRIT: 49.5 % (ref 39.0–52.0)
HEMOGLOBIN: 16.9 g/dL (ref 13.0–17.0)
MCH: 32.1 pg (ref 26.0–34.0)
MCHC: 34.1 g/dL (ref 30.0–36.0)
MCV: 94.1 fL (ref 80.0–100.0)
Platelets: 247 10*3/uL (ref 150–400)
RBC: 5.26 MIL/uL (ref 4.22–5.81)
RDW: 12.6 % (ref 11.5–15.5)
WBC: 10.2 10*3/uL (ref 4.0–10.5)
nRBC: 0 % (ref 0.0–0.2)

## 2018-01-14 LAB — ETHANOL: ALCOHOL ETHYL (B): 244 mg/dL — AB (ref <10)

## 2018-01-14 NOTE — ED Notes (Signed)
Patient requesting to use phone at this time.

## 2018-01-14 NOTE — BH Assessment (Addendum)
Assessment Note  John [name] is an 37 y.o. male who presents to ED by way of law enforcement after reportedly endorsing suicidal ideation. Pt came to ED with a BAC of 244. He currently denies SI/HI/AVH. He reports "my ex-girlfriend is just trying to have me committed". He reports an ongoing stressful relationship with his ex-girlfriend. Pt appears to be minimizing the situation stating "I got cut on my wrist from a broken glass in a window". He insisted on showing his laceration to this Clinical research associatewriter. He adamantly denies cutting his wrist in an attempt to end his life. He reports "I love life, I'm a Christian fella". He reports having a brother who committed suicide when pt's brother was 756 years old. He also reported the death of his daughter, in [id number], when she was 39-years-old. He further reports recent use of alcohol ("2 cans of 12oz beer") and marijuana use; although, pt appears to be minimizing his substance use. He reports having upcoming court dates for 3 charges: assault on a male, trespassing, and communicating threats.  Diagnosis: Major Depressive Disorder  Past Medical History:  Past Medical History:  Diagnosis Date  . ASD (atrial septal defect)   . Bacterial infection due to H. pylori   . Hypertension   . Kidney stones     Past Surgical History:  Procedure Laterality Date  . ASD REPAIR    . FOOT SURGERY Right   . KNEE SURGERY Right     Family History: No family history on file.  Social History:  reports that he has been smoking cigarettes. He has been smoking about 0.50 packs per day. He has never used smokeless tobacco. He reports that he drinks alcohol. He reports that he does not use drugs.  Additional Social History:  Alcohol / Drug Use Pain Medications: See MAR Prescriptions: See MAR Over the Counter: See MAR History of alcohol / drug use?: Yes Longest period of sobriety (when/how long): Unable to Quantify Negative Consequences of Use: Financial, Personal relationships,  Armed forces operational officerLegal, Work / School Withdrawal Symptoms: Agitation, Cramps, Sweats Substance #1 Name of Substance 1: Alcohol 1 - Age of First Use: Unable to Quantify 1 - Amount (size/oz): Unable to Quantify 1 - Frequency: Unable to Quantify 1 - Duration: Unable to Quantify 1 - Last Use / Amount: 01/13/2018 Substance #2 Name of Substance 2: Weed 2 - Age of First Use: Unable to Quantify 2 - Amount (size/oz): Unable to Quantify 2 - Frequency: Unable to Quantify 2 - Duration: Unable to Quantify 2 - Last Use / Amount: 01/13/2018  CIWA: CIWA-Ar BP: (!) 144/125 Pulse Rate: (!) 126 COWS:    Allergies:  Allergies  Allergen Reactions  . Penicillins     Home Medications:  (Not in a hospital admission)  OB/GYN Status:  No LMP for male patient.  General Assessment Data [facility] Assessment: In system Is this a Tele or Face-to-Face Assessment?: Face-to-Face Is this an Initial Assessment or a Re-assessment for this encounter?: Initial Assessment Patient Accompanied by:: N/A Language Other than English: No Living Arrangements: Homeless/Shelter What gender do you identify as?: Male Marital status: Long term relationship Maiden name: n/a Pregnancy Status: No Living Arrangements: Spouse/significant other Can pt return to current living arrangement?: Yes Admission Status: Involuntary Petitioner: Police Is patient capable of signing voluntary admission?: No Referral Source: Self/Family/Friend Insurance type: None  Medical Screening Exam [facility]-in ONLY) Medical Exam completed: Yes  Crisis Care Plan Living Arrangements: Spouse/significant other Legal Guardian: Other:(Self) Name of Psychiatrist: None  Reported Name of Therapist: None Reported  Education Status Is patient currently in school?: No Is the patient employed, 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 : No Suicidal Intent: No Has patient had any suicidal intent within the past 6 months prior to admission? : No Is patient at risk for suicide?: No Suicidal Plan?: No Has patient had any suicidal plan within the past 6 months prior to admission? : No Access to Means: No What has been your use of drugs/alcohol within the last 12 months?: Alcohol; weed Previous Attempts/Gestures: No How many times?: 0 Other Self Harm Risks: None Reported Triggers for Past Attempts: None known Intentional Self Injurious Behavior: None Family Suicide History: Yes(Pt's brother commited suicide when he was 11yo) Recent stressful life event(s): Conflict (Comment), Loss (Comment), Legal Issues, Financial Problems Persecutory voices/beliefs?: No Depression: Yes Depression Symptoms: Feeling angry/irritable, Guilt Substance abuse history and/or treatment for substance abuse?: Yes Suicide prevention information given to non-admitted patients: Not applicable  Risk to Others within the past 6 months Homicidal Ideation: No Does patient have any lifetime risk of violence toward others beyond the six months prior to admission? : No Thoughts of Harm to Others: No Current Homicidal Intent: No Current Homicidal Plan: No Access to Homicidal Means: No Identified Victim: None Reported History of harm to others?: Yes(Pt has a pending charge for assault on a male) Assessment of Violence: None Noted Violent Behavior Description: None Reported Does patient have access to weapons?: No Criminal Charges Pending?: Yes Describe Pending Criminal Charges: Assault on a male; Trespassing; Resisting Officer Does patient have a court date: Yes Court Date: 02/26/17(03/16/2017) Is patient on probation?: No  Psychosis Hallucinations: None noted Delusions: None noted  Mental Status Report Appearance/Hygiene: In scrubs Eye Contact: Good Motor Activity: Freedom of movement,  Agitation Speech: Logical/coherent, Pressured Level of Consciousness: Alert Mood: Ambivalent Affect: Appropriate to circumstance Anxiety Level: Moderate Thought Processes: Coherent, Relevant Judgement: Partial Orientation: Person, Place, Time, Situation, Appropriate for developmental age Obsessive Compulsive Thoughts/Behaviors: None  Cognitive Functioning Concentration: Normal Memory: Recent Intact, Remote Intact Is patient IDD: No Insight: Fair Impulse Control: Fair Appetite: Good Have you had any weight changes? : No Change Sleep: No Change Total Hours of Sleep: 6 Vegetative Symptoms: None  ADLScreening [facility] Assessment Services) Patient's cognitive ability adequate to safely complete daily activities?: Yes Patient able to express need for assistance with ADLs?: Yes Independently performs ADLs?: Yes (appropriate for developmental age)  Prior Inpatient Therapy Prior Inpatient Therapy: Yes Prior Therapy Dates: 1990s Prior Therapy Facilty/Provider(s): [facility] Reason for Treatment: Depression  Prior Outpatient Therapy Prior Outpatient Therapy: No Does patient have an ACCT team?: No Does patient have Intensive In-House Services?  : No Does patient have [facility] services? : No Does patient have P4CC services?: No  ADL Screening (condition at time of admission) Patient's cognitive ability adequate to safely complete daily activities?: Yes Patient able to express need for assistance with ADLs?: Yes Independently performs ADLs?: Yes (appropriate for developmental age)       Abuse/Neglect Assessment (Assessment to be complete while patient is alone) Abuse/Neglect Assessment Can Be Completed: Yes Physical Abuse: Denies Verbal Abuse: Denies Sexual Abuse: Denies Exploitation of patient/patient's resources: Denies Self-Neglect: Denies Values / Beliefs Cultural Requests During Hospitalization: None Spiritual Requests During Hospitalization: None Consults Spiritual Care  Consult Needed: No Social Work Consult Needed: No  Child/Adolescent Assessment Running Away Risk: (Patient is an adult)  Disposition:  Disposition Initial Assessment Completed for this Encounter: Yes Disposition of Patient: (Pending Psych Consult)  On Site Evaluation by:   Reviewed with Physician:    Wilmon Arms 01/14/2018 12:00 PM

## 2018-01-14 NOTE — Consult Note (Signed)
[facility] Face-to-Face Psychiatry Consult   Reason for Consult: Consult for 37 year old man brought to the hospital under involuntary commitment papers alleging self injury while intoxicated Referring Physician: Reita Cliche Patient Identification: John [name] MRN:  [id number] Principal Diagnosis: Substance induced mood disorder ([place]) Diagnosis:  Principal Problem:   Substance induced mood disorder ([place]) Active Problems:   Alcohol abuse   Total Time spent with patient: 1 hour  Subjective:   John [name] is a 37 y.o. male patient admitted with "my girlfriend did this to me but we are not together anymore anyway".  HPI: Patient seen chart reviewed.  This 37 year old man was brought to the hospital under involuntary commitment papers filed by his girlfriend.  Papers reported that the patient is abusing alcohol and had cut himself on the wrist.  Patient did present with a transverse laceration on his left wrist and was very intoxicated.  By the time I saw him he had calm down a little bit but still seemed to be a little bit drunk.  Nevertheless he was alert and oriented and able to engage in a conversation.  Patient admits that he drinks frequently but plays down the significance of it.  He tells me that he consumed about the equivalent of 14 beers last night which he considers a relatively small amount.  Despite this he still does not consider the alcohol to be much of a problem.  He says that the cut on his wrist came from opening a window.  How exactly this would happen does not make much sense.  However, he has consistently denied having injured himself.  Patient states his mood is good and has been fine recently.  Denies any symptoms of depression.  Admits to occasional marijuana use but denies other drug use.  Totally denies any suicidal or homicidal thoughts at all.  Social history: Sounds like he had been staying with his girlfriend but that that will not be an option anymore.  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  Medical history: No known significant ongoing medical problems.  The cut on his wrist was not a scratch but did not require sutures.  Not bleeding when I saw it.  Substance abuse history: Long-standing alcohol problem.  Patient really minimizes this and refuses to recognize how much of a problem and is been.  Denies however having had any seizures or delirium tremens.  He says he has been placed in hospitals for detox before but it does not sound like he is engage much with outpatient treatment.  Past Psychiatric History: Patient says he has been hospitalized years ago at [facility] for alcohol abuse.  Went to the alcohol and drug abuse treatment Center.  Denies however having any diagnosis of any psychiatric illness other than alcohol abuse.  Denies any history of suicide attempts.  Risk to Self: Suicidal Ideation: No Suicidal Intent: No Is patient at risk for suicide?: No Suicidal Plan?: No Access to Means: No What has been your use of drugs/alcohol within the last 12 months?: Alcohol; weed How many times?: 0 Other Self Harm Risks: None Reported Triggers for Past Attempts: None known Intentional Self Injurious Behavior: None Risk to Others: Homicidal Ideation: No Thoughts of Harm to Others: No Current Homicidal Intent: No Current Homicidal Plan: No Access to Homicidal Means: No Identified Victim: None Reported History of harm to others?: Yes(Pt has a pending charge for assault on a male) Assessment of Violence: None  Noted Violent Behavior Description: None Reported Does patient have access to weapons?: No Criminal Charges Pending?: Yes Describe Pending Criminal Charges: Assault on a male; Trespassing; Resisting Officer Does patient have a court date: Yes Court Date: 02/26/17(03/16/2017) Prior Inpatient Therapy: Prior Inpatient Therapy: Yes Prior Therapy Dates: 1990s Prior Therapy Facilty/Provider(s): [facility] Reason  for Treatment: Depression Prior Outpatient Therapy: Prior Outpatient Therapy: No Does patient have an ACCT team?: No Does patient have Intensive In-House Services?  : No Does patient have [facility] services? : No Does patient have P4CC services?: No  Past Medical History:  Past Medical History:  Diagnosis Date  . ASD (atrial septal defect)   . Bacterial infection due to H. pylori   . Hypertension   . Kidney stones     Past Surgical History:  Procedure Laterality Date  . ASD REPAIR    . FOOT SURGERY Right   . KNEE SURGERY Right    Family History: No family history on file. Family Psychiatric  History: Admits to family history of alcohol abuse Social History:  Social History   Substance and Sexual Activity  Alcohol Use Yes   Comment: occasional     Social History   Substance and Sexual Activity  Drug Use No    Social History   Socioeconomic History  . Marital status: Single    Spouse name: Not on file  . Number of children: Not on file  . Years of education: Not on file  . Highest education level: Not on file  Occupational History  . Not on file  Social Needs  . Financial resource strain: Not on file  . Food insecurity:    Worry: Not on file    Inability: Not on file  . Transportation needs:    Medical: Not on file    Non-medical: Not on file  Tobacco Use  . Smoking status: Current Every Day Smoker    Packs/day: 0.50    Types: Cigarettes  . Smokeless tobacco: Never Used  Substance and Sexual Activity  . Alcohol use: Yes    Comment: occasional  . Drug use: No  . Sexual activity: Yes    Birth control/protection: None  Lifestyle  . Physical activity:    Days per week: Not on file    Minutes per session: Not on file  . Stress: Not on file  Relationships  . Social connections:    Talks on phone: Not on file    Gets together: Not on file    Attends religious service: Not on file    Active member of club or organization: Not on file    Attends meetings  of clubs or organizations: Not on file    Relationship status: Not on file  Other Topics Concern  . Not on file  Social History Narrative  . Not on file   Additional Social History:    Allergies:   Allergies  Allergen Reactions  . Penicillins     Labs:  Results for orders placed or performed during the hospital encounter of 01/14/18 (from the past 48 hour(s))  Comprehensive metabolic panel     Status: Abnormal   Collection Time: 01/14/18  8:28 AM  Result Value Ref Range   Sodium 143 135 - 145 mmol/L   Potassium 3.6 3.5 - 5.1 mmol/L   Chloride 104 98 - 111 mmol/L   CO2 29 22 - 32 mmol/L   Glucose, Bld 115 (H) 70 - 99 mg/dL   BUN 6 6 - 20 mg/dL  Creatinine, Ser 1.00 0.61 - 1.24 mg/dL   Calcium 8.5 (L) 8.9 - 10.3 mg/dL   Total Protein 7.0 6.5 - 8.1 g/dL   Albumin 4.1 3.5 - 5.0 g/dL   AST 30 15 - 41 U/L   ALT 21 0 - 44 U/L   Alkaline Phosphatase 50 38 - 126 U/L   Total Bilirubin 0.7 0.3 - 1.2 mg/dL   GFR calc non Af Amer >60 >60 mL/min   GFR calc Af Amer >60 >60 mL/min    Comment: (NOTE) The eGFR has been calculated using the CKD EPI equation. This calculation has not been validated in all clinical situations. eGFR's persistently <60 mL/min signify possible Chronic Kidney Disease.    Anion gap 10 5 - 15    Comment: Performed at [facility], [address] [place], [place] [address]  Ethanol     Status: Abnormal   Collection Time: 01/14/18  8:28 AM  Result Value Ref Range   Alcohol, Ethyl (B) 244 (H) <10 mg/dL    Comment: (NOTE) Lowest detectable limit for serum alcohol is 10 mg/dL. For medical purposes only. Performed at [facility], [address] [place], [place] [address]   cbc     Status: None   Collection Time: 01/14/18  8:28 AM  Result Value Ref Range   WBC 10.2 4.0 - 10.5 K/uL   RBC 5.26 4.22 - 5.81 MIL/uL   Hemoglobin 16.9 13.0 - 17.0 g/dL   HCT 49.5 39.0 - 52.0 %   MCV 94.1 80.0 - 100.0 fL   MCH 32.1 26.0 - 34.0 pg   MCHC  34.1 30.0 - 36.0 g/dL   RDW 12.6 11.5 - 15.5 %   Platelets 247 150 - 400 K/uL   nRBC 0.0 0.0 - 0.2 %    Comment: Performed at [facility], [address], [place], [place] [address]  Urine Drug Screen, Qualitative     Status: Abnormal   Collection Time: 01/14/18  8:28 AM  Result Value Ref Range   Tricyclic, Ur Screen NONE DETECTED NONE DETECTED   Amphetamines, Ur Screen NONE DETECTED NONE DETECTED   MDMA (Ecstasy)Ur Screen NONE DETECTED NONE DETECTED   Cocaine Metabolite,Ur [place] NONE DETECTED NONE DETECTED   Opiate, Ur Screen NONE DETECTED NONE DETECTED   Phencyclidine (PCP) Ur S NONE DETECTED NONE DETECTED   Cannabinoid 50 Ng, Ur [place] POSITIVE (A) NONE DETECTED   Barbiturates, Ur Screen NONE DETECTED NONE DETECTED   Benzodiazepine, Ur Scrn NONE DETECTED NONE DETECTED   Methadone Scn, Ur NONE DETECTED NONE DETECTED    Comment: (NOTE) Tricyclics + metabolites, urine    Cutoff 1000 ng/mL Amphetamines + metabolites, urine  Cutoff 1000 ng/mL MDMA (Ecstasy), urine              Cutoff 500 ng/mL Cocaine Metabolite, urine          Cutoff 300 ng/mL Opiate + metabolites, urine        Cutoff 300 ng/mL Phencyclidine (PCP), urine         Cutoff 25 ng/mL Cannabinoid, urine                 Cutoff 50 ng/mL Barbiturates + metabolites, urine  Cutoff 200 ng/mL Benzodiazepine, urine              Cutoff 200 ng/mL Methadone, urine                   Cutoff 300 ng/mL The urine drug screen  provides only a preliminary, unconfirmed analytical test result and should not be used for non-medical purposes. Clinical consideration and professional judgment should be applied to any positive drug screen result due to possible interfering substances. A more specific alternate chemical method must be used in order to obtain a confirmed analytical result. Gas chromatography / mass spectrometry (GC/MS) is the preferred confirmat ory method. Performed at [facility], [address]  [place], [place] [address]     No current facility-administered medications for this encounter.    Current Outpatient Medications  Medication Sig Dispense Refill  . brompheniramine-pseudoephedrine-DM 30-2-10 MG/5ML syrup Take 5 mLs by mouth 4 (four) times daily as needed. 120 mL 0  . oxyCODONE-acetaminophen (ROXICET) 5-325 MG tablet Take 1 tablet by mouth every 6 (six) hours as needed for moderate pain. 12 tablet 0  . predniSONE (DELTASONE) 10 MG tablet Take 6 tablets  today, on day 2 take 5 tablets, day 3 take 4 tablets, day 4 take 3 tablets, day 5 take  2 tablets and 1 tablet the last day 21 tablet 0  . sulfamethoxazole-trimethoprim (BACTRIM DS,SEPTRA DS) 800-160 MG tablet Take 1 tablet by mouth 2 (two) times daily. 20 tablet 0    Musculoskeletal: Strength & Muscle Tone: within normal limits Gait & Station: normal Patient leans: N/A  Psychiatric Specialty Exam: Physical Exam  Nursing note and vitals reviewed. Constitutional: He appears well-developed and well-nourished.  HENT:  Head: Normocephalic and atraumatic.  Eyes: Pupils are equal, round, and reactive to light. Conjunctivae are normal.  Neck: Normal range of motion.  Cardiovascular: Regular rhythm and normal heart sounds.  Respiratory: Effort normal.  GI: Soft.  Musculoskeletal: Normal range of motion.  Neurological: He is alert.  Skin: Skin is warm and dry.  Patient has a transverse cut on his left wrist just below the junction with the hand.  Single cut.  Psychiatric: His affect is labile. His speech is tangential. He is agitated. He is not aggressive. Thought content is not paranoid. Cognition and memory are impaired. He expresses impulsivity. He expresses no homicidal and no suicidal ideation.    Review of Systems  Constitutional: Negative.   HENT: Negative.   Eyes: Negative.   Respiratory: Negative.   Cardiovascular: Negative.   Gastrointestinal: Negative.   Musculoskeletal: Negative.   Skin: Negative.    Neurological: Negative.   Psychiatric/Behavioral: Positive for memory loss and substance abuse. Negative for depression, hallucinations and suicidal ideas. The patient is not nervous/anxious and does not have insomnia.     Blood pressure (!) 144/125, pulse (!) 126, temperature 98.3 F (36.8 C), temperature source Oral, resp. rate 20, height '5\' 9"'$  (1.753 m), weight 68 kg, SpO2 99 %.Body mass index is 22.15 kg/m.  General Appearance: Disheveled  Eye Contact:  Good  Speech:  Pressured  Volume:  Increased  Mood:  Euthymic  Affect:  Labile  Thought Process:  Coherent  Orientation:  Full (Time, Place, and Person)  Thought Content:  Rumination  Suicidal Thoughts:  No  Homicidal Thoughts:  No  Memory:  Immediate;   Fair Recent;   Poor Remote;   Fair  Judgement:  Impaired  Insight:  Shallow  Psychomotor Activity:  Restlessness  Concentration:  Concentration: Fair  Recall:  [employer] of Knowledge:  Fair  Language:  Fair  Akathisia:  No  Handed:  Right  AIMS (if indicated):     Assets:  Desire for Improvement Housing Physical Health  ADL's:  Intact  Cognition:  WNL  Sleep:        Treatment Plan Summary: Plan Patient has consistently denied any suicidal or homicidal thoughts since being in the emergency room.  He is sobering up and appears to be able to hold a lucid conversation.  Not reporting any psychotic symptoms.  Patient shows poor insight into his alcohol abuse.  He is not however at this point meeting commitment criteria.  Does not have any interest in inpatient substance abuse treatment.  Case reviewed with emergency room doctor and TTS.  Discontinue IVC.  Did some psychoeducation and strongly encourage the patient to get involved with local substance abuse treatment.  Disposition: No evidence of imminent risk to self or others at present.   Patient does not meet criteria for psychiatric inpatient admission. Supportive therapy provided about ongoing stressors. Discussed  crisis plan, support from social network, calling 911, coming to the Emergency Department, and calling Suicide Hotline.  Alethia Berthold, MD 01/14/2018 2:25 PM

## 2018-01-14 NOTE — ED Notes (Signed)
The patient was dressed out into the required purple scrubs. His belongings were placed into a white patient belongings bag and labeled properly. Belongings consist of one camo style jacket, one pair blue jeans, white socks and pair of sneakers. All clothing had dried blood on them from a cut to his left wrist. He was then walked to bhu room 23 with out any issues. Belongings were given to the bhu nurse.

## 2018-01-14 NOTE — ED Notes (Signed)
Patient in room getting dressed for discharge at this time.

## 2018-01-14 NOTE — ED Notes (Addendum)
Spoke to pt's ex-girlfriend, Worthy FlankKelley Overman, who brought 2 bags (black bookbag and a big clear plastic bag that was tied shut) to drop off for patient.  Pt had 3 voicemails from pt last night where patient was sobbing and begging her to come home.  Pt stated that, "I am going to kill myself.  I already called my father and told everybody."  Pt's ex-girlfriend stated that the patient was recently released from jail for hitting her in the head.  She states that the patient's mood is very up and down and she doesn't feel safe when around him.  She states that she was going to take out a DVO against the patient.  She states that the patient is a chronic alcoholic, but minimizes his drinking habits.  She states that patient drinks 4-6 40 oz beers daily.  Told ex-girlfriend that I could not give out any information about the patient since she did not have a security code.  She left her cell phone number [phone].  Stated that the psychiatrist could call her with any questions.

## 2018-01-14 NOTE — ED Notes (Signed)
Pt discharged to home.  States that there is a friend waiting for him outside.  4:4 belongings bags returned to patient.  Pt stable.  Refused vitals at time of discharge.  Pt denies SI, HI, or A/V hallucinations.  Pt contracts for safety.

## 2018-01-14 NOTE — ED Notes (Signed)
Patient refused lunch ,patient threw tray in trash can

## 2018-01-14 NOTE — ED Notes (Signed)
Patient on phone ,patient upset yelling do not have people calling up here getting into my business

## 2018-01-14 NOTE — ED Notes (Signed)
Pt yelling and cursing while on the phone with his parents.  Pt yelling, "I don't want people calling up here trying to get in my business and telling people that I'm suicidal.  My brother killed himself when he was [age].  If I  wanted to kill myself then I know how to do it."

## 2018-01-14 NOTE — ED Provider Notes (Addendum)
[facility] Emergency Department Provider Note ____________________________________________   I have reviewed the triage vital signs and the triage nursing note.  HISTORY  Chief Complaint Laceration and Alcohol Intoxication   Historian Level 5 Caveat History Limited by intoxicated  HPI John [name] is a 37 y.o. male brought in by the police with involuntary commitment papers which state that ex-girlfriend checked on him this morning and found that he was bleeding from a left wrist laceration.  Paperwork indicates patient has a history of bipolar and is off medications.  Patient noted to have been drinking alcohol last night.  Patient denies drinking alcohol.  He denies self injury to his wrist.  States that he was opening the window and sustained an injury to his left wrist yesterday evening.  States he does have a history of cutting behavior in the past.      Past Medical History:  Diagnosis Date  . ASD (atrial septal defect)   . Bacterial infection due to H. pylori   . Hypertension   . Kidney stones     There are no active problems to display for this patient.   Past Surgical History:  Procedure Laterality Date  . ASD REPAIR    . FOOT SURGERY Right   . KNEE SURGERY Right     Prior to Admission medications   Medication Sig Start Date End Date Taking? Authorizing Provider  brompheniramine-pseudoephedrine-DM 30-2-10 MG/5ML syrup Take 5 mLs by mouth 4 (four) times daily as needed. 06/03/17   Tommi Rumps, PA-C  oxyCODONE-acetaminophen (ROXICET) 5-325 MG tablet Take 1 tablet by mouth every 6 (six) hours as needed for moderate pain. 06/04/16   Joni Reining, PA-C  predniSONE (DELTASONE) 10 MG tablet Take 6 tablets  today, on day 2 take 5 tablets, day 3 take 4 tablets, day 4 take 3 tablets, day 5 take  2 tablets and 1 tablet the last day 06/03/17   Tommi Rumps, PA-C  sulfamethoxazole-trimethoprim (BACTRIM DS,SEPTRA DS) 800-160 MG tablet  Take 1 tablet by mouth 2 (two) times daily. 06/03/17   Tommi Rumps, PA-C    Allergies  Allergen Reactions  . Penicillins     No family history on file.  Social History Social History   Tobacco Use  . Smoking status: Current Every Day Smoker    Packs/day: 0.50    Types: Cigarettes  . Smokeless tobacco: Never Used  Substance Use Topics  . Alcohol use: Yes    Comment: occasional  . Drug use: No    Review of Systems  Constitutional: Negative for fever. Eyes: Negative for visual changes. ENT: Negative for sore throat. Cardiovascular: Negative for chest pain. Respiratory: Negative for shortness of breath. Gastrointestinal: Negative for abdominal pain, vomiting and diarrhea. Genitourinary: Negative for dysuria. Musculoskeletal: Negative for back pain. Skin: Negative for rash.  Laceration left wrist Neurological: Negative for headache.  ____________________________________________   PHYSICAL EXAM:  VITAL SIGNS: ED Triage Vitals [01/14/18 0811]  Enc Vitals Group     BP (!) 144/125     Pulse Rate (!) 126     Resp 20     Temp 98.3 F (36.8 C)     Temp Source Oral     SpO2 99 %     Weight 150 lb (68 kg)     Height 5\' 9"  (1.753 m)     Head Circumference      Peak Flow      Pain Score 0  Pain Loc      Pain Edu?      Excl. in GC?      Constitutional: Alert and smells of alcohol.  HEENT      Head: Normocephalic and atraumatic.      Eyes: Conjunctivae are normal. Pupils equal and round.       Ears:         Nose: No congestion/rhinnorhea.      Mouth/Throat: Mucous membranes are moist.      Neck: No stridor.  Nontender C-spine. Cardiovascular/Chest: Normal rate, regular rhythm.  No murmurs, rubs, or gallops. Respiratory: Normal respiratory effort without tachypnea nor retractions. Breath sounds are clear and equal bilaterally. No wheezes/rales/rhonchi. Gastrointestinal: Soft. No distention, no guarding, no rebound. Nontender.     Genitourinary/rectal:Deferred Musculoskeletal: Left wrist anterior superficial laceration, hemostatic. Neurologic:  Normal speech and language. No gross or focal neurologic deficits are appreciated. Skin:  Skin is warm, dry.  Left wrist laceration as above. Psychiatric: Appears intoxicated.  Cooperative.   ____________________________________________  LABS (pertinent positives/negatives) I, Governor Rooksebecca Maybell Misenheimer, MD the attending physician have reviewed the labs noted below.  Labs Reviewed  COMPREHENSIVE METABOLIC PANEL - Abnormal; Notable for the following components:      Result Value   Glucose, Bld 115 (*)    Calcium 8.5 (*)    All other components within normal limits  ETHANOL - Abnormal; Notable for the following components:   Alcohol, Ethyl (B) 244 (*)    All other components within normal limits  URINE DRUG SCREEN, QUALITATIVE ([facility] ONLY) - Abnormal; Notable for the following components:   Cannabinoid 50 Ng, Ur [place] POSITIVE (*)    All other components within normal limits  CBC    ____________________________________________    EKG I, Governor Rooksebecca Diem Dicocco, MD, the attending physician have personally viewed and interpreted all ECGs.  None ____________________________________________  RADIOLOGY   None __________________________________________  PROCEDURES  Procedure(s) performed: Laceration repair.  Left wrist.  Superficial laceration anterior distal left wrist.  Dermabond closure after washing with normal saline.  Performed by myself, Dr. Shaune PollackLord MD.  Procedures  Critical Care performed: None   ____________________________________________  ED COURSE / ASSESSMENT AND PLAN  Pertinent labs & imaging results that were available during my care of the patient were reviewed by me and considered in my medical decision making (see chart for details).     Patient came in under IVC.  Patient is intoxicated and denies that the laceration to the risk was intentional.  It looks like a  location and precision that it was self-inflicted.  It is extremely superficial, I Did Place, Dermabond.  Dr. Toni Amendlapacs did come see this patient and is releasing him from involuntary commitment patient is not requesting any help with detox.  He is not expressing any suicidal ideation and I will give him referral follow-up information for both [facility] as well as primary care.    CONSULTATIONS: TTS and psychiatry.   Patient / Family / Caregiver informed of clinical course, medical decision-making process, and agree with plan.   I discussed return precautions, follow-up instructions, and discharge instructions with patient and/or family.  Discharge Instructions : Return to the emergency department immediately for any worsening condition including confusion altered mental status, if you want help with detox, any thoughts of wanting to hurt yourself or others, or any other symptoms concerning to you.  Skin glue should come off on its own in a week or two.    Patient alert and oriented and steady on his  feet, does not appear clinically intoxicated.  Clinically sober.  Patient states he is getting go to his friend's house.   ___________________________________________   FINAL CLINICAL IMPRESSION(S) / ED DIAGNOSES   Final diagnoses:  Alcoholic intoxication without complication (HCC)  Laceration of left wrist, initial encounter      ___________________________________________         Note: This dictation was prepared with Dragon dictation. Any transcriptional errors that result from this process are unintentional    Governor Rooks, MD 01/14/18 1230    Governor Rooks, MD 01/14/18 1240

## 2018-01-14 NOTE — ED Triage Notes (Signed)
Pt in via [employer] under involuntary commitment. Pt intoxicated and non-compliant with medication. Pt also with laceration on his left wrist. Pt states that he cut it on a piece of glass.

## 2018-01-14 NOTE — ED Notes (Signed)
Pt noted to have superficial cuts to the left forearm. Covered with dry gauze.  Pt states he cut wrist while he was trying to raise the window.  Pt states that the window was already messed up and a piece of the glass cut his wrist.

## 2018-01-14 NOTE — Discharge Instructions (Addendum)
Return to the emergency department immediately for any worsening condition including confusion altered mental status, if you want help with detox, any thoughts of wanting to hurt yourself or others, or any other symptoms concerning to you.  Skin glue should come off on its own in a week or two.

## 2018-01-14 NOTE — Consult Note (Signed)
  Psychiatry: Patient seen.  Brief note.  Full note to follow.  37 year old man with alcohol abuse.  Currently does not meet commitment criteria.  Strongly encouraged to engage in substance abuse treatment.  Case reviewed with ER physician and TTS.

## 2018-02-03 ENCOUNTER — Other Ambulatory Visit: Payer: Self-pay

## 2018-02-03 ENCOUNTER — Emergency Department
Admission: EM | Admit: 2018-02-03 | Discharge: 2018-02-04 | Payer: Self-pay | Attending: Emergency Medicine | Admitting: Emergency Medicine

## 2018-02-03 ENCOUNTER — Emergency Department: Payer: Self-pay

## 2018-02-03 ENCOUNTER — Encounter: Payer: Self-pay | Admitting: *Deleted

## 2018-02-03 DIAGNOSIS — J209 Acute bronchitis, unspecified: Secondary | ICD-10-CM | POA: Insufficient documentation

## 2018-02-03 DIAGNOSIS — Z79899 Other long term (current) drug therapy: Secondary | ICD-10-CM | POA: Insufficient documentation

## 2018-02-03 DIAGNOSIS — R042 Hemoptysis: Secondary | ICD-10-CM

## 2018-02-03 DIAGNOSIS — F10929 Alcohol use, unspecified with intoxication, unspecified: Secondary | ICD-10-CM | POA: Insufficient documentation

## 2018-02-03 DIAGNOSIS — F1010 Alcohol abuse, uncomplicated: Secondary | ICD-10-CM

## 2018-02-03 DIAGNOSIS — I10 Essential (primary) hypertension: Secondary | ICD-10-CM | POA: Insufficient documentation

## 2018-02-03 DIAGNOSIS — F17210 Nicotine dependence, cigarettes, uncomplicated: Secondary | ICD-10-CM | POA: Insufficient documentation

## 2018-02-03 LAB — BASIC METABOLIC PANEL
Anion gap: 12 (ref 5–15)
BUN: 12 mg/dL (ref 6–20)
CO2: 24 mmol/L (ref 22–32)
CREATININE: 0.88 mg/dL (ref 0.61–1.24)
Calcium: 8.8 mg/dL — ABNORMAL LOW (ref 8.9–10.3)
Chloride: 104 mmol/L (ref 98–111)
GFR calc non Af Amer: >60 mL/min (ref >60)
GLUCOSE: 94 mg/dL (ref 70–99)
POTASSIUM: 3.6 mmol/L (ref 3.5–5.1)
SODIUM: 140 mmol/L (ref 135–145)

## 2018-02-03 LAB — CBC
HEMATOCRIT: 39.9 % (ref 39.0–52.0)
Hemoglobin: 13.6 g/dL (ref 13.0–17.0)
MCH: 31.5 pg (ref 26.0–34.0)
MCHC: 34.1 g/dL (ref 30.0–36.0)
MCV: 92.4 fL (ref 80.0–100.0)
Platelets: 217 10*3/uL (ref 150–400)
RBC: 4.32 MIL/uL (ref 4.22–5.81)
RDW: 13.6 % (ref 11.5–15.5)
WBC: 6.6 10*3/uL (ref 4.0–10.5)
nRBC: 0 % (ref 0.0–0.2)

## 2018-02-03 MED ORDER — AZITHROMYCIN 500 MG PO TABS
500.0000 mg | ORAL_TABLET | Freq: Once | ORAL | Status: AC
  Administered 2018-02-03: 500 mg via ORAL
  Filled 2018-02-03: qty 1

## 2018-02-03 MED ORDER — VITAMIN B-1 100 MG PO TABS
100.0000 mg | ORAL_TABLET | Freq: Once | ORAL | Status: AC
  Administered 2018-02-03: 100 mg via ORAL
  Filled 2018-02-03: qty 1

## 2018-02-03 MED ORDER — FOLIC ACID 1 MG PO TABS
1.0000 mg | ORAL_TABLET | Freq: Once | ORAL | Status: AC
  Administered 2018-02-03: 1 mg via ORAL
  Filled 2018-02-03: qty 1

## 2018-02-03 MED ORDER — LORAZEPAM 2 MG/ML IJ SOLN
1.0000 mg | Freq: Once | INTRAMUSCULAR | Status: AC
  Administered 2018-02-03: 2 mg via INTRAVENOUS
  Filled 2018-02-03: qty 1

## 2018-02-03 MED ORDER — AZITHROMYCIN 250 MG PO TABS: ORAL_TABLET | ORAL | 0 refills | Status: DC

## 2018-02-03 NOTE — ED Notes (Signed)
Pt brought in by ems.  Pt reports coughing up bright red blood today.  Pt is intoxicated.  Pt has tremors.  No chest pain or sob.  cig smoker.  Pt is under police custody

## 2018-02-03 NOTE — ED Triage Notes (Signed)
Pt brought in via ems.  Pt in police custody.  Pt reports he has been drinking and coughing up blood today. Pt alert.

## 2018-02-03 NOTE — ED Notes (Signed)
Report off to allie rn  

## 2018-02-03 NOTE — ED Notes (Signed)
Pt. Given two meal trays and drink.

## 2018-02-03 NOTE — ED Notes (Signed)
Pt provided with water and graham crackers at this time. Call bell within reach and remote to tv provided. Pt remains in police custody at this time. Office remains at bedside.

## 2018-02-03 NOTE — ED Notes (Signed)
Handoff received from Amy, RN. Introduced to patient at this time. Call bell within reach. Pt requesting water at this time.

## 2018-02-03 NOTE — ED Notes (Signed)
meds given

## 2018-02-03 NOTE — Discharge Instructions (Addendum)
Finish antibiotic as prescribed.  Return to the ER for worsening symptoms, persistent vomiting, difficulty breathing or other concerns.

## 2018-02-03 NOTE — ED Notes (Signed)
Patient transported to X-ray 

## 2018-02-03 NOTE — ED Provider Notes (Signed)
[facility] Emergency Department Provider Note   ____________________________________________   First MD Initiated Contact with Patient 02/03/18 2153     (approximate)  I have reviewed the triage vital signs and the nursing notes.   HISTORY  Chief Complaint Alcohol Intoxication  HPI Ricky AlaKirk J Milanese is a 37 y.o. male with a history of alcohol abuse, kidney stones and hypertension  Patient evidently was arrested today after the police were called for possibly violating a restraining order.  He reports that while in police custody and he told him about a cough he is been having today.  He reports he is been having of frequent cough with occasional slight bloody tingeing of the sputum.  Denies that he is vomiting up any blood and reports it is not coming up from his stomach.  He is felt to slightly fatigued and a little bit of chills and developed some slight cough with sputum production and a small amount of blood tingeing in the last day.  No nausea vomiting.  Reports he drinks alcohol daily, he has stopped previous in the past he will get slight shakes and tremors which he is not experiencing now.  His last drink was earlier today.  Denies history of complicated withdrawal or seizures.  Denies any recent trauma or surgeries.  No history of blood clots.  No recent immobilization.  Takes no medications or estrogens.  Denies any leg swelling.  No chest pain.  No shortness of breath, just a frequent cough.     Past Medical History:  Diagnosis Date  . ASD (atrial septal defect)   . Bacterial infection due to H. pylori   . Hypertension   . Kidney stones     Patient Active Problem List   Diagnosis Date Noted  . Alcohol abuse 01/14/2018  . Substance induced mood disorder (HCC) 01/14/2018    Past Surgical History:  Procedure Laterality Date  . ASD REPAIR    . FOOT SURGERY Right   . KNEE SURGERY Right     Prior to Admission medications   Medication Sig  Start Date End Date Taking? Authorizing Provider  azithromycin (ZITHROMAX Z-PAK) 250 MG tablet 1 tab by mouth daily 02/03/18   Sharyn CreamerQuale, Mark, MD  brompheniramine-pseudoephedrine-DM 30-2-10 MG/5ML syrup Take 5 mLs by mouth 4 (four) times daily as needed. 06/03/17   Tommi RumpsSummers, Rhonda L, PA-C  oxyCODONE-acetaminophen (ROXICET) 5-325 MG tablet Take 1 tablet by mouth every 6 (six) hours as needed for moderate pain. 06/04/16   Joni ReiningSmith, Ronald K, PA-C  predniSONE (DELTASONE) 10 MG tablet Take 6 tablets  today, on day 2 take 5 tablets, day 3 take 4 tablets, day 4 take 3 tablets, day 5 take  2 tablets and 1 tablet the last day 06/03/17   Tommi RumpsSummers, Rhonda L, PA-C  sulfamethoxazole-trimethoprim (BACTRIM DS,SEPTRA DS) 800-160 MG tablet Take 1 tablet by mouth 2 (two) times daily. 06/03/17   Tommi RumpsSummers, Rhonda L, PA-C    Allergies Penicillins  No family history on file.  Social History Social History   Tobacco Use  . Smoking status: Current Every Day Smoker    Packs/day: 0.50    Types: Cigarettes  . Smokeless tobacco: Never Used  Substance Use Topics  . Alcohol use: Yes    Comment: occasional  . Drug use: No    Review of Systems Constitutional: No fever some slight chills Eyes: No visual changes. ENT: No sore throat. Cardiovascular: Denies chest pain. Respiratory: Denies shortness of breath. See HPI. Gastrointestinal: No  abdominal pain.   Genitourinary: Negative for dysuria. Musculoskeletal: Negative for back pain. Skin: Negative for rash. Neurological: Negative for headaches, areas of focal weakness or numbness. Psychiatric: denies desire to harm himself or anyone else at present.  Does report that his drinking has become a major problem and he needs to quit   ____________________________________________   PHYSICAL EXAM:  VITAL SIGNS: ED Triage Vitals  Enc Vitals Group     BP 02/03/18 2141 130/84     Pulse Rate 02/03/18 2114 (!) 119     Resp 02/03/18 2114 20     Temp 02/03/18 2114 98.6 F  (37 C)     Temp Source 02/03/18 2114 Oral     SpO2 02/03/18 2114 97 %     Weight 02/03/18 2114 150 lb (68 kg)     Height 02/03/18 2114 5\' 9"  (1.753 m)     Head Circumference --      Peak Flow --      Pain Score 02/03/18 2114 0     Pain Loc --      Pain Edu? --      Excl. in GC? --     Constitutional: Alert and oriented. Well appearing and in no acute distress. Eyes: Conjunctivae are slightly injected bilateral. Head: Atraumatic. Nose: No congestion/rhinnorhea. Mouth/Throat: Mucous membranes are lightly dry. Neck: No stridor.  Cardiovascular: Normal rate, regular rhythm. Grossly normal heart sounds.  Good peripheral circulation. Respiratory: Normal respiratory effort.  No retractions. Lungs CTAB.  No noted cough at this time.  Reports is intermittent.  Does not appear short of breath.  Speaks in full clear sentences. Gastrointestinal: Soft and nontender. No distention.  Requesting something to eat and drink. Musculoskeletal: No lower extremity tenderness nor edema. Neurologic:  Normal speech and language. No gross focal neurologic deficits are appreciated.  No hyperreflexia.  No tremulousness. Skin:  Skin is warm, dry and intact. No rash noted. Psychiatric: Mood and affect are normal. Speech and behavior are normal.  ____________________________________________   LABS (all labs ordered are listed, but only abnormal results are displayed)  Labs Reviewed  BASIC METABOLIC PANEL - Abnormal; Notable for the following components:      Result Value   Calcium 8.8 (*)    All other components within normal limits  CBC  FIBRIN DERIVATIVES D-DIMER [facility] ONLY)   ____________________________________________  EKG  Reviewed and interpreted by me at 2120 Heart rate 115 QRS 90 QTc 430 Sinus tachycardia without evidence of acute ischemia. ____________________________________________  RADIOLOGY  Dg Chest 2 View  Result Date: 02/03/2018 CLINICAL DATA:  37 year old male with  hemoptysis. EXAM: CHEST - 2 VIEW COMPARISON:  Chest radiograph dated 06/03/2017 FINDINGS: The heart size and mediastinal contours are within normal limits. Both lungs are clear. The visualized skeletal structures are unremarkable. IMPRESSION: No active cardiopulmonary disease. Electronically Signed   By: Elgie Collard M.D.   On: 02/03/2018 21:44    ____________________________________________   PROCEDURES  Procedure(s) performed: None  Procedures  Critical Care performed: No  ____________________________________________   INITIAL IMPRESSION / ASSESSMENT AND PLAN / ED COURSE  Pertinent labs & imaging results that were available during my care of the patient were reviewed by me and considered in my medical decision making (see chart for details).   Patient here for evaluation for cough with small amounts of tingeing and reported blood possibly slight hemoptysis.  He does report he is an alcoholic, currently in police custody.  Slightly tachycardic on presentation, improving with IV fluids.  He denies withdrawal symptoms at this time, however he is a heavy user but denies complicated withdrawal in the past.  We will give a small dose of Ativan as well as thiamine and folate.  Chest x-ray reassuring without infiltrate.  Given his heavy alcohol use, reported slight hemoptysis I suspect is likely acute bronchitis.  Denies any abdominal symptoms and reports no history to suggest this is hematemesis or potentially from a gastrointestinal source.  He has no associated chest pain, afebrile, does not appear that this would be consistent with mediastinitis or Boerhaave's type syndrome as he denies any vomiting.  I will however send a d-dimer given his slight tachycardia, continue fluids and allow the patient to eat and reevaluate thereafter.     Vitals:   02/03/18 2142 02/03/18 2230  BP:  (!) 132/92  Pulse: (!) 108 (!) 104  Resp: (!) 23 16  Temp:    SpO2: 98% 100%    ----------------------------------------- 11:14 PM on 02/03/2018 -----------------------------------------  Ongoing care assigned to Dr. Megan Salon.  Follow-up on d-dimer for exclusion of low risk of pulmonary embolism.  Reassessment of patient, at present improving with heart rate improving resting comfortably having eaten a meal.  Anticipate likely discharge with prescription for azithromycin if d-dimer normal, otherwise potential need for CT chest with contrast if positive d-dimer. ____________________________________________   FINAL CLINICAL IMPRESSION(S) / ED DIAGNOSES  Final diagnoses:  ETOH abuse  Cough with hemoptysis  Acute bronchitis, unspecified organism        Note:  This document was prepared using Dragon voice recognition software and may include unintentional dictation errors       Sharyn Creamer, MD 02/03/18 2316

## 2018-02-03 NOTE — ED Notes (Signed)
Unable to get blood pressure, pt is trembling.

## 2018-02-03 NOTE — ED Notes (Signed)
Pt eating dinner tray °

## 2018-02-04 LAB — FIBRIN DERIVATIVES D-DIMER (ARMC ONLY): Fibrin derivatives D-dimer (ARMC): 407.57 ng/mL (FEU) (ref 0.00–499.00)

## 2018-02-04 NOTE — ED Notes (Signed)
Pt verbalized understanding of d/c instructions and f/u care. No further questions at this time. Discharged in police custody.

## 2018-02-04 NOTE — ED Provider Notes (Signed)
-----------------------------------------   12:40 AM on 02/04/2018 -----------------------------------------  Delay secondary to lab.  D-dimer is unremarkable.  Will discharge patient to jail in police custody.  Strict return precautions given.  Patient verbalizes understanding agrees with plan of care.   Irean HongSung, Jade J, MD 02/04/18 [phone]

## 2018-06-27 ENCOUNTER — Inpatient Hospital Stay
Admit: 2018-06-27 | Discharge: 2018-06-27 | Disposition: A | Discharge status: Home / Self Care | Attending: Emergency Medicine

## 2018-06-27 ENCOUNTER — Emergency Department: Admit: 2018-06-27 | Primary: Nurse Practitioner

## 2018-06-27 DIAGNOSIS — R0789 Other chest pain: Secondary | ICD-10-CM

## 2018-06-27 LAB — METABOLIC PANEL, COMPREHENSIVE
A-G Ratio: 0.9 — ABNORMAL LOW (ref 1.1–2.2)
ALT (SGPT): 20 U/L (ref 12–78)
AST (SGOT): 18 U/L (ref 15–37)
Albumin: 3.9 g/dL (ref 3.5–5.0)
Alk. phosphatase: 99 U/L (ref 45–117)
Anion gap: 9 mmol/L (ref 5–15)
BUN/Creatinine ratio: 11 — ABNORMAL LOW (ref 12–20)
BUN: 9 MG/DL (ref 6–20)
Bilirubin, total: 0.2 MG/DL (ref 0.2–1.0)
CO2: 23 mmol/L (ref 21–32)
Calcium: 8.6 MG/DL (ref 8.5–10.1)
Chloride: 108 mmol/L (ref 97–108)
Creatinine: 0.79 MG/DL (ref 0.70–1.30)
GFR est AA: >60 mL/min/{1.73_m2} (ref >60)
GFR est non-AA: >60 mL/min/{1.73_m2} (ref >60)
Globulin: 4.4 g/dL — ABNORMAL HIGH (ref 2.0–4.0)
Glucose: 79 mg/dL (ref 65–100)
Potassium: 3.3 mmol/L — ABNORMAL LOW (ref 3.5–5.1)
Protein, total: 8.3 g/dL — ABNORMAL HIGH (ref 6.4–8.2)
Sodium: 140 mmol/L (ref 136–145)

## 2018-06-27 LAB — CBC WITH AUTOMATED DIFF
ABS. BASOPHILS: 0.1 10*3/uL (ref 0.0–0.1)
ABS. EOSINOPHILS: 0.1 10*3/uL (ref 0.0–0.4)
ABS. IMM. GRANS.: 0 10*3/uL (ref 0.00–0.04)
ABS. LYMPHOCYTES: 3.1 10*3/uL (ref 0.8–3.5)
ABS. MONOCYTES: 0.7 10*3/uL (ref 0.0–1.0)
ABS. NEUTROPHILS: 6.4 10*3/uL (ref 1.8–8.0)
ABSOLUTE NRBC: 0 10*3/uL (ref 0.00–0.01)
BASOPHILS: 1 % (ref 0–1)
EOSINOPHILS: 1 % (ref 0–7)
HCT: 44.4 % (ref 36.6–50.3)
HGB: 15 g/dL (ref 12.1–17.0)
IMMATURE GRANULOCYTES: 0 % (ref 0.0–0.5)
LYMPHOCYTES: 30 % (ref 12–49)
MCH: 29.7 PG (ref 26.0–34.0)
MCHC: 33.8 g/dL (ref 30.0–36.5)
MCV: 87.9 FL (ref 80.0–99.0)
MONOCYTES: 7 % (ref 5–13)
MPV: 8.1 FL — ABNORMAL LOW (ref 8.9–12.9)
NEUTROPHILS: 61 % (ref 32–75)
NRBC: 0 PER 100 WBC
PLATELET: 359 10*3/uL (ref 150–400)
RBC: 5.05 M/uL (ref 4.10–5.70)
RDW: 14.2 % (ref 11.5–14.5)
WBC: 10.3 10*3/uL (ref 4.1–11.1)

## 2018-06-27 LAB — EKG, 12 LEAD, INITIAL
Atrial Rate: 109 {beats}/min
Calculated P Axis: 31 degrees
Calculated R Axis: 99 degrees
Calculated T Axis: -29 degrees
P-R Interval: 124 ms
Q-T Interval: 334 ms
QRS Duration: 94 ms
QTC Calculation (Bezet): 449 ms
Ventricular Rate: 109 {beats}/min

## 2018-06-27 LAB — TROPONIN I
Troponin-I, Qt.: <0.05 ng/mL (ref <0.05)
Troponin-I, Qt.: <0.05 ng/mL (ref <0.05)

## 2018-06-27 LAB — SAMPLES BEING HELD

## 2018-06-27 LAB — COMPREHENSIVE METABOLIC PANEL
ALT: 20 U/L (ref 12–78)
AST: 18 U/L (ref 15–37)
Albumin/Globulin Ratio: 0.9 — ABNORMAL LOW (ref 1.1–2.2)
Albumin: 3.9 g/dL (ref 3.5–5.0)
Alkaline Phosphatase: 99 U/L (ref 45–117)
Anion Gap: 9 mmol/L (ref 5–15)
BUN: 9 MG/DL (ref 6–20)
Bun/Cre Ratio: 11 — ABNORMAL LOW (ref 12–20)
CO2: 23 mmol/L (ref 21–32)
Calcium: 8.6 MG/DL (ref 8.5–10.1)
Chloride: 108 mmol/L (ref 97–108)
Creatinine: 0.79 MG/DL (ref 0.70–1.30)
EGFR IF NonAfrican American: >60 mL/min/{1.73_m2} (ref >60)
GFR African American: >60 mL/min/{1.73_m2} (ref >60)
Globulin: 4.4 g/dL — ABNORMAL HIGH (ref 2.0–4.0)
Glucose: 79 mg/dL (ref 65–100)
Potassium: 3.3 mmol/L — ABNORMAL LOW (ref 3.5–5.1)
Sodium: 140 mmol/L (ref 136–145)
Total Bilirubin: 0.2 MG/DL (ref 0.2–1.0)
Total Protein: 8.3 g/dL — ABNORMAL HIGH (ref 6.4–8.2)

## 2018-06-27 LAB — EKG 12-LEAD
Atrial Rate: 109 {beats}/min
P Axis: 31 degrees
P-R Interval: 124 ms
Q-T Interval: 334 ms
QRS Duration: 94 ms
QTc Calculation (Bazett): 449 ms
R Axis: 99 degrees
T Axis: -29 degrees
Ventricular Rate: 109 {beats}/min

## 2018-06-27 LAB — CBC WITH AUTO DIFFERENTIAL
Basophils %: 1 % (ref 0–1)
Basophils Absolute: 0.1 10*3/uL (ref 0.0–0.1)
Eosinophils %: 1 % (ref 0–7)
Eosinophils Absolute: 0.1 10*3/uL (ref 0.0–0.4)
Granulocyte Absolute Count: 0 10*3/uL (ref 0.00–0.04)
Hematocrit: 44.4 % (ref 36.6–50.3)
Hemoglobin: 15 g/dL (ref 12.1–17.0)
Immature Granulocytes: 0 % (ref 0.0–0.5)
Lymphocytes %: 30 % (ref 12–49)
Lymphocytes Absolute: 3.1 10*3/uL (ref 0.8–3.5)
MCH: 29.7 PG (ref 26.0–34.0)
MCHC: 33.8 g/dL (ref 30.0–36.5)
MCV: 87.9 FL (ref 80.0–99.0)
MPV: 8.1 FL — ABNORMAL LOW (ref 8.9–12.9)
Monocytes %: 7 % (ref 5–13)
Monocytes Absolute: 0.7 10*3/uL (ref 0.0–1.0)
NRBC Absolute: 0 10*3/uL (ref 0.00–0.01)
Neutrophils %: 61 % (ref 32–75)
Neutrophils Absolute: 6.4 10*3/uL (ref 1.8–8.0)
Nucleated RBCs: 0 PER 100 WBC
Platelets: 359 10*3/uL (ref 150–400)
RBC: 5.05 M/uL (ref 4.10–5.70)
RDW: 14.2 % (ref 11.5–14.5)
WBC: 10.3 10*3/uL (ref 4.1–11.1)

## 2018-06-27 LAB — TROPONIN
Troponin I: <0.05 ng/mL (ref <0.05)
Troponin I: <0.05 ng/mL (ref <0.05)

## 2018-06-27 MED ORDER — ASPIRIN 81 MG CHEWABLE TAB
81 mg | ORAL | Status: AC
Start: 2018-06-27 — End: 2018-06-27
  Administered 2018-06-27: 08:00:00 via ORAL

## 2018-06-27 MED FILL — ASPIRIN 81 MG CHEWABLE TAB: 81 mg | ORAL | Qty: 4

## 2018-06-27 NOTE — ED Notes (Signed)
UPDATE:  1900: pt's GF Kim called and spoke with Dr. Titchner, demanding that pt be admitted to the hospital. Dr. Titchner discussed that he did not meet medical criteria for admission. She then states that he needed to be admitted for psych. Pt is already en route to the hospital; MD told her we would see him and evaluate him again. Charge RN made aware.
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
## 2018-06-27 NOTE — ED Notes (Signed)
XR at bedside

## 2018-06-27 NOTE — ED Notes (Signed)
Bedside and Verbal shift change report given to Karmon/Sam (oncoming nurse) by Shannon/Becca (offgoing nurse). Report included the following information SBAR, Kardex, ED Summary, Intake/Output, MAR and Recent Results.

## 2018-06-27 NOTE — ED Triage Notes (Signed)
Pt arrives via EMS with complaints of chest pain.  Pt was sitting outside of [employer] waiting for a ride when it started.  Pt states he has nausea. Pt denies SOB.  Denies taking anything for the pain.
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
## 2018-06-27 NOTE — Progress Notes (Signed)
8:32 AM  CM reviewed EMR and spoke to nursing in the ER. Pt has since found a place to stay and [facility] is set up. ER will inform CM if anything changes. Kristie Locke

## 2018-06-27 NOTE — Progress Notes (Signed)
8:32 AM  CM reviewed EMR and spoke to nursing in the ER. Pt has since found a place to stay and [facility] is set up. ER will inform CM if anything changes. Devona Konig

## 2018-06-27 NOTE — ED Notes (Signed)
XR at bedside
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
## 2018-06-27 NOTE — ED Notes (Signed)
UPDATE:  1900: pt's GF Kim called and spoke with Dr. Antoine Primas, demanding that pt be admitted to the hospital. Dr. Antoine Primas discussed that he did not meet medical criteria for admission. She then states that he needed to be admitted for psych. Pt is already en route to the hospital; MD told her we would see him and evaluate him again. Charge RN made aware.

## 2018-06-27 NOTE — ED Notes (Signed)
Bedside and Verbal shift change report given to Karmon/Sam (oncoming nurse) by Shannon/Becca (offgoing nurse). Report included the following information SBAR, Kardex, ED Summary, Intake/Output, MAR and Recent Results.

## 2018-06-27 NOTE — ED Notes (Signed)
Pt arrives via EMS with complaints of chest pain.  Pt was sitting outside of [employer] waiting for a ride when it started.  Pt states he has nausea. Pt denies SOB.  Denies taking anything for the pain.
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
## 2018-07-21 ENCOUNTER — Telehealth
Admit: 2018-07-21 | Discharge: 2018-07-21 | Payer: PRIVATE HEALTH INSURANCE | Attending: Nurse Practitioner | Primary: Nurse Practitioner

## 2018-07-21 ENCOUNTER — Telehealth: Attending: Nurse Practitioner | Primary: Nurse Practitioner

## 2018-07-21 DIAGNOSIS — N35919 Unspecified urethral stricture, male, unspecified site: Secondary | ICD-10-CM
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
## 2018-07-21 NOTE — Progress Notes (Addendum)
Chief Complaint   Patient presents with   ??? Establish Care   ??? Groin Pain     sedament in pt's supra pubic cath     Patient has not been out of the country in 45-90 days, NO diarrhea, NO cough, NO chest conjestion, NO temp.  Pt has not been around anyone with these symptoms.     Health Maintenance reviewed.    I have reviewed the patient's medical history in detail and updated the computerized patient record.      Encouraged pt to discuss pt's wishes with spouse/partner/family and bring them in the next appt to follow thru with the Advanced Directive    Fall Risk Assessment, last 12 mths 07/21/2018   Able to walk? No       3 most recent PHQ Screens 07/21/2018   Little interest or pleasure in doing things Nearly every day   Feeling down, depressed, irritable, or hopeless Nearly every day   Total Score PHQ 2 6       Abuse Screening Questionnaire 07/21/2018   Do you ever feel afraid of your partner? N   Are you in a relationship with someone who physically or mentally threatens you? N   Is it safe for you to go home? Y       ADL Assessment 07/21/2018   Feeding yourself No Help Needed   Getting from bed to chair No Help Needed   Getting dressed No Help Needed   Bathing or showering Help Needed   Walk across the room (includes cane/walker) No Help Needed   Using the telphone No Help Needed   Taking your medications No Help Needed   Preparing meals Help Needed   Managing money (expenses/bills) Help Needed   Moderately strenuous housework (laundry) Help Needed   Shopping for personal items (toiletries/medicines) Help Needed   Shopping for groceries Help Needed   Driving Help Needed   Climbing a flight of stairs Help Needed   Getting to places beyond walking distances Help Needed

## 2018-07-21 NOTE — Progress Notes (Signed)
LVM for pt in reference to virtual visit.

## 2018-07-21 NOTE — Progress Notes (Signed)
As per VORB from Amy A. Wilson, NP  Referrl to Urology  Dr. Jeffrey Lou  [facility]  [address] [place]  [address]    Tele [phone] scheduler  Fax  [phone]     Date: Jul 22, 2018 @9:20AM  Bring picture ID, insurance cards and a list of medications.  Arrive 15-30 early.  Pt was told by Amy Wilson, NP

## 2018-07-21 NOTE — Progress Notes (Signed)
Chief Complaint   Patient presents with   . Establish Care   . Groin Pain     sedament in pt's supra pubic cath     Patient has not been out of the country in 45-90 days, NO diarrhea, NO cough, NO chest conjestion, NO temp.  Pt has not been around anyone with these symptoms.     Health Maintenance reviewed.    I have reviewed the patient's medical history in detail and updated the computerized patient record.      Encouraged pt to discuss pt's wishes with spouse/partner/family and bring them in the next appt to follow thru with the Advanced Directive    Fall Risk Assessment, last 12 mths 07/21/2018   Able to walk? No       3 most recent PHQ Screens 07/21/2018   Little interest or pleasure in doing things Nearly every day   Feeling down, depressed, irritable, or hopeless Nearly every day   Total Score PHQ 2 6       Abuse Screening Questionnaire 07/21/2018   Do you ever feel afraid of your partner? N   Are you in a relationship with someone who physically or mentally threatens you? N   Is it safe for you to go home? Y       ADL Assessment 07/21/2018   Feeding yourself No Help Needed   Getting from bed to chair No Help Needed   Getting dressed No Help Needed   Bathing or showering Help Needed   Walk across the room (includes cane/walker) No Help Needed   Using the telphone No Help Needed   Taking your medications No Help Needed   Preparing meals Help Needed   Managing money (expenses/bills) Help Needed   Moderately strenuous housework (laundry) Help Needed   Shopping for personal items (toiletries/medicines) Help Needed   Shopping for groceries Help Needed   Driving Help Needed   Climbing a flight of stairs Help Needed   Getting to places beyond walking distances Help Needed
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
## 2018-07-21 NOTE — Progress Notes (Signed)
As per VORB from Amy A. Andrey Campanile, NP  Referrl to Urology  Dr. Trudi Ida  [facility]  [address]  [place], [place]  [address]    Tele [phone] scheduler  Fax  [phone]     Date: Jul 22, 2018 @9 :20AM  Bring picture ID, insurance cards and a list of medications.  Arrive 15-30 early.  Pt was told by Standley Dakins, NP

## 2018-07-29 ENCOUNTER — Encounter: Attending: Urology

## 2018-08-17 DIAGNOSIS — T83090A Other mechanical complication of cystostomy catheter, initial encounter: Secondary | ICD-10-CM

## 2018-08-17 NOTE — ED Notes (Signed)
Dr Orgill at bedside and replaced suprapubic catheter with 16F catheter.  Catheter draining amber cloudy urine.
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
## 2018-08-17 NOTE — ED Notes (Signed)
Dr Orgill at bedside and assisted patient while he removed the abdominal dressing around the s/p catheter site, that drains into a drainage foley bag. Also noted a dual port "PICC line" in his right upper arm and an "ankle bracelet" from a facility on the right ankle area.

## 2018-08-17 NOTE — ED Notes (Signed)
Attempted to bladder scan pt. Pt states, "That hurts." This RN explained that in order to see how much urine is in his bladder, light pressure must be applied to the abdomen. Pt refuses bladder scan and starts to scream, "Y'all aren't going to cause me fucking pain. I'll leave this fucking place." MD aware.

## 2018-08-17 NOTE — ED Notes (Addendum)
Urine sent to lab at this time. Urine emptied foley bag 800 ml.
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
## 2018-08-17 NOTE — ED Notes (Signed)
 Attempted to bladder scan pt. Pt states, That hurts. This RN explained that in order to see how much urine is in his bladder, light pressure must be applied to the abdomen. Pt refuses bladder scan and starts to scream, Y'all aren't going to cause me fucking pain. I'll leave this fucking place. MD aware.
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
## 2018-08-17 NOTE — ED Notes (Signed)
 Dr Shearon at bedside and assisted patient while he removed the abdominal dressing around the s/p catheter site, that drains into a drainage foley bag. Also noted a dual port PICC line in his right upper arm and an ankle bracelet from a facility on the right ankle area.

## 2018-08-17 NOTE — ED Notes (Signed)
Urine sent to lab at this time. Urine emptied foley bag 800 ml.

## 2018-08-17 NOTE — ED Notes (Signed)
Dr Arvin Collard at bedside and replaced suprapubic catheter with 64F catheter.  Catheter draining amber cloudy urine.

## 2018-08-18 ENCOUNTER — Inpatient Hospital Stay
Admit: 2018-08-18 | Discharge: 2018-08-18 | Disposition: A | Discharge status: Home / Self Care | Attending: Emergency Medicine

## 2018-08-18 LAB — URINALYSIS W/MICROSCOPIC
Bilirubin: NEGATIVE
Glucose: NEGATIVE mg/dL
Ketone: NEGATIVE mg/dL
Nitrites: NEGATIVE
Protein: 100 mg/dL — AB
Specific gravity: 1.023 (ref 1.003–1.030)
Urobilinogen: 0.2 EU/dL (ref 0.2–1.0)
WBC: >100 /hpf — ABNORMAL HIGH (ref 0–4)
pH (UA): 6.5 (ref 5.0–8.0)

## 2018-08-18 LAB — URINALYSIS WITH MICROSCOPIC
Bilirubin, Urine: NEGATIVE
Glucose, Ur: NEGATIVE mg/dL
Ketones, Urine: NEGATIVE mg/dL
Nitrite, Urine: NEGATIVE
Protein, UA: 100 mg/dL — AB
Specific Gravity, UA: 1.023 (ref 1.003–1.030)
Urobilinogen, UA, POCT: 0.2 EU/dL (ref 0.2–1.0)
WBC, UA: >100 /hpf — ABNORMAL HIGH (ref 0–4)
pH, UA: 6.5 (ref 5.0–8.0)

## 2018-08-18 NOTE — Progress Notes (Signed)
Urinalysis shows bacteria Enterococcus and Yeast. We tried to contact you but were unable to reach you.  This requires antibiotics. Please contact our office as soon as possible at  #[phone].   Follow up in the office in 1 week.   Please send letter.

## 2018-08-18 NOTE — ED Notes (Signed)
Case Management speaking with patient via telephone

## 2018-08-18 NOTE — ED Notes (Signed)
Pt ambulatory out of ED to waiting room with walker. Pt has several full water bottles and applesauce with personal belongings. Discharge paperwork given to patient.  Ride to arrive in 10 minutes

## 2018-08-18 NOTE — ED Notes (Signed)
Pt on telephone with [facility]
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
## 2018-08-18 NOTE — Progress Notes (Signed)
Preliminary result probable Enterococcus and yeast. Wait for final culture.

## 2018-08-18 NOTE — ED Notes (Signed)
Per patient, representative at [facility] was not available. He will call back. Vital signs updated. Pt reports minimal pain at this time. Call bell in reach

## 2018-08-18 NOTE — Progress Notes (Signed)
Ordered in the ER. Culture pending.

## 2018-08-18 NOTE — Progress Notes (Signed)
In addition, culture showed Vancomycin Resistant Enterococcus Faecium.   Hopefully this is being treated with susceptible antibiotics.   If not please contact the office at #[phone] as soon as possible.    Follow up in one week.   Please send letter.

## 2018-08-18 NOTE — Progress Notes (Signed)
Number in chart for law office. No other number.  Will send registered letter

## 2018-08-18 NOTE — ED Notes (Addendum)
Transportation arrangements for patient to go home being made by Cathy, Case Management.    Pt discharged.

## 2018-08-18 NOTE — Progress Notes (Signed)
Culture pending. Pt went to ER and is scheduled to go to [facility] 08/18/18.

## 2018-08-18 NOTE — ED Notes (Signed)
Noted pt discharge status.  Pt stated he is homeless and no where to go and no way to get there.  Spoke with nursing supervisor and she will page the social worker.

## 2018-08-18 NOTE — ED Notes (Addendum)
Discharge note: The patient was discharged in stable condition. The patient is alert and oriented, is in no respiratory distress. The patient's diagnosis, condition and treatment were explained to patient by Dr Orgill and reinforced by nurse. The patient  expressed understanding of discharge instructions and plan of care.  We need a discharge plan developed d/t pt stated he is homeless.  A case manager has been consulted to assisted with discharge.

## 2018-08-18 NOTE — ED Notes (Addendum)
Verbal shift change report given to Kendra, RN (oncoming nurse) by Kathy, RN (offgoing nurse). Report included the following information ED Summary. Pt resting on stretcher in no obvious distress. Provided pt with graham crackers and apple sauce. Water bottles already at bedside. Awaiting case management assistance.

## 2018-08-18 NOTE — ED Notes (Signed)
Pt ambulatory out of ED to waiting room with walker. Pt has several full water bottles and applesauce with personal belongings. Discharge paperwork given to patient.  Ride to arrive in 10 minutes

## 2018-08-18 NOTE — Progress Notes (Signed)
Ordered in the ER. Culture pending.

## 2018-08-18 NOTE — ED Notes (Signed)
Per patient, representative at [facility] was not available. He will call back. Vital signs updated. Pt reports minimal pain at this time. Call bell in reach

## 2018-08-18 NOTE — ED Notes (Signed)
Pt on telephone with [facility]

## 2018-08-18 NOTE — Progress Notes (Signed)
In addition, culture showed Vancomycin Resistant Enterococcus Faecium.   Hopefully this is being treated with susceptible antibiotics.   If not please contact the office at [phone] as soon as possible.    Follow up in one week.   Please send letter.

## 2018-08-18 NOTE — Progress Notes (Signed)
Preliminary result probable Enterococcus and yeast. Wait for final culture.

## 2018-08-18 NOTE — Progress Notes (Signed)
Urinalysis shows bacteria Enterococcus and Yeast. We tried to contact you but were unable to reach you.  This requires antibiotics. Please contact our office as soon as possible at  [phone].   Follow up in the office in 1 week.   Please send letter.

## 2018-08-18 NOTE — ED Notes (Signed)
Case Management speaking with patient via telephone

## 2018-08-18 NOTE — ED Notes (Signed)
Noted pt discharge status.  Pt stated he is homeless and no where to go and no way to get there.  Spoke with nursing supervisor and she will page the Child psychotherapist.

## 2018-08-18 NOTE — ED Notes (Signed)
Verbal shift change report given to Enrique Sack, Charity fundraiser (Cabin crew) by Olegario Messier, RN (offgoing nurse). Report included the following information ED Summary. Pt resting on stretcher in no obvious distress. Provided pt with graham crackers and apple sauce. Water bottles already at bedside. Awaiting case management assistance.

## 2018-08-18 NOTE — ED Notes (Signed)
Transportation arrangements for patient to go home being made by [facility], Case Management.    Pt discharged.

## 2018-08-18 NOTE — Progress Notes (Signed)
Culture pending. Pt went to ER and is scheduled to go to [facility] 08/18/18.

## 2018-08-18 NOTE — ED Notes (Signed)
Discharge note: The patient was discharged in stable condition. The patient is alert and oriented, is in no respiratory distress. The patient's diagnosis, condition and treatment were explained to patient by Dr Arvin Collard and reinforced by nurse. The patient  expressed understanding of discharge instructions and plan of care.  We need a discharge plan developed d/t pt stated he is homeless.  A case manager has been consulted to assisted with discharge.
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
## 2018-08-18 NOTE — Progress Notes (Signed)
Number in chart for law office. No other number.  Will send registered letter

## 2018-08-21 LAB — CULTURE, URINE
Colonies Counted: >100000
Colony Count: >100000

## 2018-08-21 NOTE — Telephone Encounter (Signed)
Left message. Unsure if this is the correct number?

## 2018-08-24 NOTE — Telephone Encounter (Signed)
Spoke with [place] on 5A at [employer] in [place], [place]. She stated she received the fax which showed his urine culture results- VRE and yeast.  Nurse stated she would have the Hospitalist paged again to call me. Did not receive a phone call earlier. Want to make sure they saw the results and pt is being treated for UTI.
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
## 2018-08-25 ENCOUNTER — Encounter (HOSPITAL_COMMUNITY): Payer: Self-pay

## 2018-08-25 ENCOUNTER — Other Ambulatory Visit: Payer: Self-pay

## 2018-08-25 ENCOUNTER — Emergency Department (HOSPITAL_COMMUNITY): Payer: Self-pay

## 2018-08-25 ENCOUNTER — Emergency Department (HOSPITAL_COMMUNITY)
Admission: EM | Admit: 2018-08-25 | Discharge: 2018-08-26 | Payer: Self-pay | Attending: Emergency Medicine | Admitting: Emergency Medicine

## 2018-08-25 DIAGNOSIS — R079 Chest pain, unspecified: Secondary | ICD-10-CM

## 2018-08-25 DIAGNOSIS — I10 Essential (primary) hypertension: Secondary | ICD-10-CM | POA: Insufficient documentation

## 2018-08-25 DIAGNOSIS — Z79899 Other long term (current) drug therapy: Secondary | ICD-10-CM | POA: Insufficient documentation

## 2018-08-25 DIAGNOSIS — Z960 Presence of urogenital implants: Secondary | ICD-10-CM | POA: Insufficient documentation

## 2018-08-25 DIAGNOSIS — F17210 Nicotine dependence, cigarettes, uncomplicated: Secondary | ICD-10-CM | POA: Insufficient documentation

## 2018-08-25 DIAGNOSIS — E860 Dehydration: Secondary | ICD-10-CM | POA: Insufficient documentation

## 2018-08-25 LAB — URINALYSIS, ROUTINE W REFLEX MICROSCOPIC
Bilirubin Urine: NEGATIVE
Glucose, UA: NEGATIVE mg/dL
Ketones, ur: NEGATIVE mg/dL
Nitrite: NEGATIVE
Protein, ur: NEGATIVE mg/dL
Specific Gravity, Urine: 1.004 — ABNORMAL LOW (ref 1.005–1.030)
WBC, UA: >50 WBC/hpf — ABNORMAL HIGH (ref 0–5)
pH: 8 (ref 5.0–8.0)

## 2018-08-25 LAB — BASIC METABOLIC PANEL
Anion gap: 14 (ref 5–15)
BUN: 7 mg/dL (ref 6–20)
CO2: 23 mmol/L (ref 22–32)
Calcium: 9.2 mg/dL (ref 8.9–10.3)
Chloride: 103 mmol/L (ref 98–111)
Creatinine, Ser: 0.67 mg/dL (ref 0.61–1.24)
GFR calc Af Amer: >60 mL/min (ref >60)
GFR calc non Af Amer: >60 mL/min (ref >60)
Glucose, Bld: 89 mg/dL (ref 70–99)
Potassium: 3.7 mmol/L (ref 3.5–5.1)
Sodium: 140 mmol/L (ref 135–145)

## 2018-08-25 LAB — CBC
HCT: 41.6 % (ref 39.0–52.0)
Hemoglobin: 13.9 g/dL (ref 13.0–17.0)
MCH: 29.8 pg (ref 26.0–34.0)
MCHC: 33.4 g/dL (ref 30.0–36.0)
MCV: 89.1 fL (ref 80.0–100.0)
Platelets: 278 10*3/uL (ref 150–400)
RBC: 4.67 MIL/uL (ref 4.22–5.81)
RDW: 14.3 % (ref 11.5–15.5)
WBC: 9.3 10*3/uL (ref 4.0–10.5)
nRBC: 0 % (ref 0.0–0.2)

## 2018-08-25 LAB — TROPONIN I (HIGH SENSITIVITY)
Troponin I (High Sensitivity): <2 ng/L (ref <18)
Troponin I (High Sensitivity): <2 ng/L (ref <18)

## 2018-08-25 LAB — CK: Total CK: 87 U/L (ref 49–397)

## 2018-08-25 LAB — LACTIC ACID, PLASMA: Lactic Acid, Venous: 2.1 mmol/L (ref 0.5–1.9)

## 2018-08-25 MED ORDER — SODIUM CHLORIDE 0.9 % IV BOLUS
2000.0000 mL | Freq: Once | INTRAVENOUS | Status: AC
  Administered 2018-08-25: 2000 mL via INTRAVENOUS

## 2018-08-25 NOTE — Discharge Instructions (Signed)
Drink plenty of fluids.  Follow up with your urologist as planned

## 2018-08-25 NOTE — ED Triage Notes (Addendum)
Pt was at [facility] for suspected for infection and was dropped off by them at the gas station. Paperwork from facility at bedside.  Been outside since this am. Hasn't ate . Had a few beer. Came in complaining of chest pain.  90/50 bp. Tachy 119bpm  with ems.  Does have sup. pubic catheter- has follow up appointment with urology already.  Family to bring walker  18 r ac- 100 ivf

## 2018-08-25 NOTE — ED Notes (Signed)
CRITICAL VALUE ALERT  Critical Value:  2.1 Lactic acid  Date & Time Notied:  08/25/2018 2121  Provider Notified: dr. Roderic Palau  Orders Received/Actions taken: see chart

## 2018-08-25 NOTE — ED Provider Notes (Signed)
[facility] EMERGENCY DEPARTMENT Provider Note   CSN: [id number] Arrival date & time: 08/25/18  2007     History   Chief Complaint Chief Complaint  Patient presents with  . Chest Pain    HPI John [name] is a 38 y.o. male.     Patient was just discharged from med hospital today.  He was in the hospital for 5 days and treated for possible infection but was told that he did not have an infection.  Patient has suprapubic catheter.  He is followed by urology in [place].  He was sitting outside in the hot sun for 8 hours and then was brought in here for weakness  The history is provided by the patient. No language interpreter was used.  Weakness Severity:  Moderate Onset quality:  Sudden Timing:  Constant Progression:  Worsening Chronicity:  New Context: not alcohol use   Relieved by:  Nothing Worsened by:  Nothing Ineffective treatments:  None tried Associated symptoms: no abdominal pain, no chest pain, no cough, no diarrhea, no frequency, no headaches and no seizures     Past Medical History:  Diagnosis Date  . ASD (atrial septal defect)   . Bacterial infection due to H. pylori   . Hypertension   . Kidney stones     Patient Active Problem List   Diagnosis Date Noted  . Alcohol abuse 01/14/2018  . Substance induced mood disorder (HCC) 01/14/2018    Past Surgical History:  Procedure Laterality Date  . ASD REPAIR    . FOOT SURGERY Right   . KNEE SURGERY Right         Home Medications    Prior to Admission medications   Medication Sig Start Date End Date Taking? Authorizing Provider  azithromycin (ZITHROMAX Z-PAK) 250 MG tablet 1 tab by mouth daily 02/03/18   Sharyn CreamerQuale, Mark, MD  brompheniramine-pseudoephedrine-DM 30-2-10 MG/5ML syrup Take 5 mLs by mouth 4 (four) times daily as needed. 06/03/17   Tommi RumpsSummers, Rhonda L, PA-C  oxyCODONE-acetaminophen (ROXICET) 5-325 MG tablet Take 1 tablet by mouth every 6 (six) hours as needed for moderate pain. 06/04/16   Joni ReiningSmith,  Ronald K, PA-C  predniSONE (DELTASONE) 10 MG tablet Take 6 tablets  today, on day 2 take 5 tablets, day 3 take 4 tablets, day 4 take 3 tablets, day 5 take  2 tablets and 1 tablet the last day 06/03/17   Tommi RumpsSummers, Rhonda L, PA-C  sulfamethoxazole-trimethoprim (BACTRIM DS,SEPTRA DS) 800-160 MG tablet Take 1 tablet by mouth 2 (two) times daily. 06/03/17   Tommi RumpsSummers, Rhonda L, PA-C    Family History History reviewed. No pertinent family history.  Social History Social History   Tobacco Use  . Smoking status: Current Every Day Smoker    Packs/day: 0.50    Types: Cigarettes  . Smokeless tobacco: Never Used  Substance Use Topics  . Alcohol use: Yes    Comment: occasional  . Drug use: No     Allergies   Penicillins   Review of Systems Review of Systems  Constitutional: Negative for appetite change and fatigue.  HENT: Negative for congestion, ear discharge and sinus pressure.   Eyes: Negative for discharge.  Respiratory: Negative for cough.   Cardiovascular: Negative for chest pain.  Gastrointestinal: Negative for abdominal pain and diarrhea.  Genitourinary: Negative for frequency and hematuria.  Musculoskeletal: Negative for back pain.  Skin: Negative for rash.  Neurological: Positive for weakness. Negative for seizures and headaches.  Psychiatric/Behavioral: Negative for hallucinations.  Physical Exam Updated Vital Signs BP 97/60   Pulse 89   Temp 98.1 F (36.7 C) (Oral)   Resp 20   Ht 5\' 9"  (1.753 m)   Wt 74.8 kg   SpO2 94%   BMI 24.37 kg/m   Physical Exam Vitals signs and nursing note reviewed.  Constitutional:      Appearance: Normal appearance. He is well-developed.  HENT:     Head: Normocephalic.     Nose: Nose normal.  Eyes:     General: No scleral icterus.    Conjunctiva/sclera: Conjunctivae normal.  Neck:     Musculoskeletal: Neck supple.     Thyroid: No thyromegaly.  Cardiovascular:     Rate and Rhythm: Normal rate and regular rhythm.     Heart  sounds: No murmur. No friction rub. No gallop.   Pulmonary:     Breath sounds: No stridor. No wheezing or rales.  Chest:     Chest wall: No tenderness.  Abdominal:     General: There is no distension.     Tenderness: There is no abdominal tenderness. There is no rebound.  Musculoskeletal: Normal range of motion.  Lymphadenopathy:     Cervical: No cervical adenopathy.  Skin:    Findings: No erythema or rash.  Neurological:     Mental Status: He is alert and oriented to person, place, and time.     Motor: No abnormal muscle tone.     Coordination: Coordination normal.  Psychiatric:        Behavior: Behavior normal.      ED Treatments / Results  Labs (all labs ordered are listed, but only abnormal results are displayed) Labs Reviewed  LACTIC ACID, PLASMA - Abnormal; Notable for the following components:      Result Value   Lactic Acid, Venous 2.1 (*)    All other components within normal limits  URINALYSIS, ROUTINE W REFLEX MICROSCOPIC - Abnormal; Notable for the following components:   APPearance HAZY (*)    Specific Gravity, Urine 1.004 (*)    Hgb urine dipstick SMALL (*)    Leukocytes,Ua LARGE (*)    WBC, UA >50 (*)    Bacteria, UA RARE (*)    All other components within normal limits  URINE CULTURE  BASIC METABOLIC PANEL  CBC  TROPONIN I (HIGH SENSITIVITY)  CK  TROPONIN I (HIGH SENSITIVITY)    EKG None  Radiology Dg Chest Port 1 View  Result Date: 08/25/2018 CLINICAL DATA:  Chest pain. EXAM: PORTABLE CHEST 1 VIEW COMPARISON:  05/11/2018 FINDINGS: Chronic retrocardiac airspace opacity along with some bandlike atelectasis in the right lower lobe. The airspace opacity in the left lower lobe appears to be primarily from atelectasis on 05/24/2018,. A specific cause for the atelectasis is not identified, although the patient does have posterolateral rod and pedicle screw fixation at T11-L1 traversing across a T12 fracture. The lungs appear otherwise clear. Old healed  right clavicular fracture. IMPRESSION: 1. Chronic airspace opacity in the left lower lobe primarily from atelectasis. There is also some minimal right basilar atelectasis. The atelectasis on the left partially obscures the hemidiaphragm. I am uncertain whether this chronic atelectasis is related to the patient's previous significant trauma which included a T12 compression fracture. In some settings, it might be appropriate to obtain a CT of the chest with contrast to exclude a central obstructing neoplastic lesion, although the patient's age of 38 years would tend to argue against lung cancer. Electronically Signed   By: Gaylyn RongWalter  Liebkemann  M.D.   On: 08/25/2018 21:02    Procedures Procedures (including critical care time)  Medications Ordered in ED Medications  sodium chloride 0.9 % bolus 2,000 mL (2,000 mLs Intravenous New Bag/Given 08/25/18 2030)     Initial Impression / Assessment and Plan / ED Course  I have reviewed the triage vital signs and the nursing notes.  Pertinent labs & imaging results that were available during my care of the patient were reviewed by me and considered in my medical decision making (see chart for details).        Patient with dehydration.  Mild heat exhaustion.  He will follow-up with his doctor  Final Clinical Impressions(s) / ED Diagnoses   Final diagnoses:  Chest pain    ED Discharge Orders    None       Milton Ferguson, MD 08/25/18 2256

## 2018-08-25 NOTE — ED Notes (Signed)
Pt refused to do orthostatic VS

## 2018-08-27 LAB — URINE CULTURE: Culture: >=100000 — AB

## 2018-08-28 ENCOUNTER — Telehealth: Payer: Self-pay | Admitting: Emergency Medicine

## 2018-08-28 NOTE — Telephone Encounter (Signed)
Post ED Visit - Positive Culture Follow-up  Culture report reviewed by antimicrobial stewardship pharmacist: [place] Team []  Elenor Quinones, Pharm.D. []  Heide Guile, Pharm.D., BCPS AQ-ID []  Parks Neptune, Pharm.D., BCPS []  Alycia Rossetti, Pharm.D., BCPS []  Vernal, Pharm.D., BCPS, AAHIVP []  Legrand Como, Pharm.D., BCPS, AAHIVP [x]  Salome Arnt, PharmD, BCPS []  Johnnette Gourd, PharmD, BCPS []  Hughes Better, PharmD, BCPS []  Leeroy Cha, PharmD []  Laqueta Linden, PharmD, BCPS []  Albertina Parr, PharmD  [place] Team []  Leodis Sias, PharmD []  Lindell Spar, PharmD []  Royetta Asal, PharmD []  Graylin Shiver, Rph []  Rema Fendt) Glennon Mac, PharmD []  Arlyn Dunning, PharmD []  Netta Cedars, PharmD []  Dia Sitter, PharmD []  Leone Haven, PharmD []  Gretta Arab, PharmD []  Theodis Shove, PharmD []  Peggyann Juba, PharmD []  Reuel Boom, PharmD   Positive urine culture  No further patient follow-up is required at this time.  John [name] John [name] 08/28/2018, 12:40 PM

## 2018-09-21 ENCOUNTER — Encounter: Attending: Urology

## 2019-04-29 DIAGNOSIS — Y92481 Parking lot as the place of occurrence of the external cause: Secondary | ICD-10-CM

## 2019-04-29 DIAGNOSIS — Y9301 Activity, walking, marching and hiking: Secondary | ICD-10-CM | POA: Diagnosis present

## 2019-04-29 DIAGNOSIS — J9601 Acute respiratory failure with hypoxia: Secondary | ICD-10-CM | POA: Diagnosis present

## 2019-04-29 DIAGNOSIS — F10129 Alcohol abuse with intoxication, unspecified: Secondary | ICD-10-CM | POA: Diagnosis present

## 2019-04-29 DIAGNOSIS — F17210 Nicotine dependence, cigarettes, uncomplicated: Secondary | ICD-10-CM | POA: Diagnosis present

## 2019-04-29 DIAGNOSIS — Z8774 Personal history of (corrected) congenital malformations of heart and circulatory system: Secondary | ICD-10-CM

## 2019-04-29 DIAGNOSIS — I10 Essential (primary) hypertension: Secondary | ICD-10-CM | POA: Diagnosis present

## 2019-04-29 DIAGNOSIS — Z79899 Other long term (current) drug therapy: Secondary | ICD-10-CM

## 2019-04-29 DIAGNOSIS — R6520 Severe sepsis without septic shock: Secondary | ICD-10-CM | POA: Diagnosis present

## 2019-04-29 DIAGNOSIS — S8262XA Displaced fracture of lateral malleolus of left fibula, initial encounter for closed fracture: Secondary | ICD-10-CM | POA: Diagnosis present

## 2019-04-29 DIAGNOSIS — G9341 Metabolic encephalopathy: Secondary | ICD-10-CM | POA: Diagnosis present

## 2019-04-29 DIAGNOSIS — S72422A Displaced fracture of lateral condyle of left femur, initial encounter for closed fracture: Secondary | ICD-10-CM | POA: Diagnosis present

## 2019-04-29 DIAGNOSIS — Z590 Homelessness: Secondary | ICD-10-CM

## 2019-04-29 DIAGNOSIS — A419 Sepsis, unspecified organism: Principal | ICD-10-CM | POA: Diagnosis present

## 2019-04-29 DIAGNOSIS — Y905 Blood alcohol level of 100-119 mg/100 ml: Secondary | ICD-10-CM | POA: Diagnosis present

## 2019-04-29 DIAGNOSIS — W1830XA Fall on same level, unspecified, initial encounter: Secondary | ICD-10-CM | POA: Diagnosis present

## 2019-04-29 DIAGNOSIS — Z880 Allergy status to penicillin: Secondary | ICD-10-CM

## 2019-04-29 DIAGNOSIS — E869 Volume depletion, unspecified: Secondary | ICD-10-CM | POA: Diagnosis present

## 2019-04-29 DIAGNOSIS — Z20822 Contact with and (suspected) exposure to covid-19: Secondary | ICD-10-CM | POA: Diagnosis present

## 2019-04-29 DIAGNOSIS — Z87442 Personal history of urinary calculi: Secondary | ICD-10-CM

## 2019-04-29 NOTE — ED Triage Notes (Signed)
Patient coming [facility] from [employer] [facility] lot for lower left leg pain. Patient released from jail today, walked 5 miles with a wheelchair used as a walker. Patient fell while walking today. Patient homeless. Patient c/o pain from knee down. Patient has hx of surgery to left lower leg with pins in place. Denied dizziness, LOC, head injury from fall. Abrasion to hand/knuckle.   EMS vitals: HR 130. Oxygen 90%. 116/62, 98.8.

## 2019-04-30 ENCOUNTER — Inpatient Hospital Stay
Admission: EM | Admit: 2019-04-30 | Discharge: 2019-05-02 | DRG: 871 | Disposition: A | Discharge status: Home / Self Care | Payer: Self-pay | Attending: Internal Medicine | Admitting: Internal Medicine

## 2019-04-30 ENCOUNTER — Emergency Department: Payer: Self-pay

## 2019-04-30 ENCOUNTER — Other Ambulatory Visit: Payer: Self-pay

## 2019-04-30 DIAGNOSIS — R6520 Severe sepsis without septic shock: Secondary | ICD-10-CM | POA: Diagnosis present

## 2019-04-30 DIAGNOSIS — J9601 Acute respiratory failure with hypoxia: Secondary | ICD-10-CM

## 2019-04-30 DIAGNOSIS — F10129 Alcohol abuse with intoxication, unspecified: Secondary | ICD-10-CM

## 2019-04-30 DIAGNOSIS — M79605 Pain in left leg: Secondary | ICD-10-CM

## 2019-04-30 DIAGNOSIS — Z9889 Other specified postprocedural states: Secondary | ICD-10-CM

## 2019-04-30 DIAGNOSIS — R0902 Hypoxemia: Secondary | ICD-10-CM

## 2019-04-30 DIAGNOSIS — A419 Sepsis, unspecified organism: Secondary | ICD-10-CM

## 2019-04-30 DIAGNOSIS — Z978 Presence of other specified devices: Secondary | ICD-10-CM

## 2019-04-30 DIAGNOSIS — S72492A Other fracture of lower end of left femur, initial encounter for closed fracture: Secondary | ICD-10-CM

## 2019-04-30 DIAGNOSIS — F1010 Alcohol abuse, uncomplicated: Secondary | ICD-10-CM

## 2019-04-30 DIAGNOSIS — W19XXXA Unspecified fall, initial encounter: Secondary | ICD-10-CM

## 2019-04-30 DIAGNOSIS — N390 Urinary tract infection, site not specified: Secondary | ICD-10-CM

## 2019-04-30 DIAGNOSIS — R52 Pain, unspecified: Secondary | ICD-10-CM

## 2019-04-30 DIAGNOSIS — G9341 Metabolic encephalopathy: Secondary | ICD-10-CM

## 2019-04-30 DIAGNOSIS — S72413A Displaced unspecified condyle fracture of lower end of unspecified femur, initial encounter for closed fracture: Secondary | ICD-10-CM

## 2019-04-30 LAB — CBC WITH DIFFERENTIAL/PLATELET
Abs Immature Granulocytes: 0.05 10*3/uL (ref 0.00–0.07)
Basophils Absolute: 0.1 10*3/uL (ref 0.0–0.1)
Basophils Relative: 0 %
Eosinophils Absolute: 0 10*3/uL (ref 0.0–0.5)
Eosinophils Relative: 0 %
HCT: 50.7 % (ref 39.0–52.0)
Hemoglobin: 16.3 g/dL (ref 13.0–17.0)
Immature Granulocytes: 0 %
Lymphocytes Relative: 11 %
Lymphs Abs: 1.6 10*3/uL (ref 0.7–4.0)
MCH: 29.1 pg (ref 26.0–34.0)
MCHC: 32.1 g/dL (ref 30.0–36.0)
MCV: 90.5 fL (ref 80.0–100.0)
Monocytes Absolute: 1.4 10*3/uL — ABNORMAL HIGH (ref 0.1–1.0)
Monocytes Relative: 10 %
Neutro Abs: 11.6 10*3/uL — ABNORMAL HIGH (ref 1.7–7.7)
Neutrophils Relative %: 79 %
Platelets: 185 10*3/uL (ref 150–400)
RBC: 5.6 MIL/uL (ref 4.22–5.81)
RDW: 13.8 % (ref 11.5–15.5)
WBC: 14.8 10*3/uL — ABNORMAL HIGH (ref 4.0–10.5)
nRBC: 0 % (ref 0.0–0.2)

## 2019-04-30 LAB — URINE DRUG SCREEN, QUALITATIVE (ARMC ONLY)
Amphetamines, Ur Screen: NOT DETECTED
Barbiturates, Ur Screen: NOT DETECTED
Benzodiazepine, Ur Scrn: NOT DETECTED
Cannabinoid 50 Ng, Ur ~~LOC~~: NOT DETECTED
Cocaine Metabolite,Ur ~~LOC~~: NOT DETECTED
MDMA (Ecstasy)Ur Screen: NOT DETECTED
Methadone Scn, Ur: NOT DETECTED
Opiate, Ur Screen: NOT DETECTED
Phencyclidine (PCP) Ur S: NOT DETECTED
Tricyclic, Ur Screen: NOT DETECTED

## 2019-04-30 LAB — PROTIME-INR
INR: 1.1 (ref 0.8–1.2)
Prothrombin Time: 14.2 seconds (ref 11.4–15.2)

## 2019-04-30 LAB — URINALYSIS, COMPLETE (UACMP) WITH MICROSCOPIC
Bacteria, UA: NONE SEEN
Bilirubin Urine: NEGATIVE
Glucose, UA: NEGATIVE mg/dL
Hgb urine dipstick: NEGATIVE
Ketones, ur: NEGATIVE mg/dL
Leukocytes,Ua: NEGATIVE
Nitrite: NEGATIVE
Protein, ur: NEGATIVE mg/dL
Specific Gravity, Urine: 1.003 — ABNORMAL LOW (ref 1.005–1.030)
Squamous Epithelial / HPF: NONE SEEN (ref 0–5)
pH: 6 (ref 5.0–8.0)

## 2019-04-30 LAB — BLOOD GAS, VENOUS
Acid-base deficit: 2.4 mmol/L — ABNORMAL HIGH (ref 0.0–2.0)
Bicarbonate: 23.7 mmol/L (ref 20.0–28.0)
O2 Saturation: 69 %
Patient temperature: 37
pCO2, Ven: 45 mmHg (ref 44.0–60.0)
pH, Ven: 7.33 (ref 7.250–7.430)
pO2, Ven: 39 mmHg (ref 32.0–45.0)

## 2019-04-30 LAB — LACTIC ACID, PLASMA
Lactic Acid, Venous: 5.3 mmol/L (ref 0.5–1.9)
Lactic Acid, Venous: 4.3 mmol/L (ref 0.5–1.9)
Lactic Acid, Venous: 2.8 mmol/L (ref 0.5–1.9)

## 2019-04-30 LAB — COMPREHENSIVE METABOLIC PANEL
ALT: 18 U/L (ref 0–44)
AST: 20 U/L (ref 15–41)
Albumin: 4.2 g/dL (ref 3.5–5.0)
Alkaline Phosphatase: 90 U/L (ref 38–126)
Anion gap: 16 — ABNORMAL HIGH (ref 5–15)
BUN: 8 mg/dL (ref 6–20)
CO2: 20 mmol/L — ABNORMAL LOW (ref 22–32)
Calcium: 8.9 mg/dL (ref 8.9–10.3)
Chloride: 100 mmol/L (ref 98–111)
Creatinine, Ser: 0.65 mg/dL (ref 0.61–1.24)
GFR calc Af Amer: >60 mL/min (ref >60)
GFR calc non Af Amer: >60 mL/min (ref >60)
Glucose, Bld: 76 mg/dL (ref 70–99)
Potassium: 3.8 mmol/L (ref 3.5–5.1)
Sodium: 136 mmol/L (ref 135–145)
Total Bilirubin: 0.7 mg/dL (ref 0.3–1.2)
Total Protein: 8.5 g/dL — ABNORMAL HIGH (ref 6.5–8.1)

## 2019-04-30 LAB — CBC
HCT: 39.1 % (ref 39.0–52.0)
Hemoglobin: 12.9 g/dL — ABNORMAL LOW (ref 13.0–17.0)
MCH: 28.7 pg (ref 26.0–34.0)
MCHC: 33 g/dL (ref 30.0–36.0)
MCV: 87.1 fL (ref 80.0–100.0)
Platelets: 150 10*3/uL (ref 150–400)
RBC: 4.49 MIL/uL (ref 4.22–5.81)
RDW: 13.8 % (ref 11.5–15.5)
WBC: 8.7 10*3/uL (ref 4.0–10.5)
nRBC: 0 % (ref 0.0–0.2)

## 2019-04-30 LAB — ETHANOL: Alcohol, Ethyl (B): 115 mg/dL — ABNORMAL HIGH (ref <10)

## 2019-04-30 LAB — HIV ANTIBODY (ROUTINE TESTING W REFLEX): HIV Screen 4th Generation wRfx: NONREACTIVE

## 2019-04-30 LAB — MAGNESIUM: Magnesium: 1.8 mg/dL (ref 1.7–2.4)

## 2019-04-30 LAB — RESPIRATORY PANEL BY RT PCR (FLU A&B, COVID)
Influenza A by PCR: NEGATIVE
Influenza B by PCR: NEGATIVE
SARS Coronavirus 2 by RT PCR: NEGATIVE

## 2019-04-30 LAB — PHOSPHORUS: Phosphorus: 4 mg/dL (ref 2.5–4.6)

## 2019-04-30 LAB — PROCALCITONIN: Procalcitonin: <0.1 ng/mL

## 2019-04-30 MED ORDER — ADULT MULTIVITAMIN W/MINERALS CH
1.0000 | ORAL_TABLET | Freq: Every day | ORAL | Status: DC
  Administered 2019-04-30 – 2019-05-02 (×3): 1 via ORAL
  Filled 2019-04-30 (×3): qty 1

## 2019-04-30 MED ORDER — KETOROLAC TROMETHAMINE 30 MG/ML IJ SOLN
30.0000 mg | Freq: Four times a day (QID) | INTRAMUSCULAR | Status: DC | PRN
  Administered 2019-04-30 – 2019-05-02 (×3): 30 mg via INTRAVENOUS
  Filled 2019-04-30 (×3): qty 1

## 2019-04-30 MED ORDER — THIAMINE HCL 100 MG/ML IJ SOLN
100.0000 mg | Freq: Every day | INTRAMUSCULAR | Status: DC
  Filled 2019-04-30: qty 2

## 2019-04-30 MED ORDER — LORAZEPAM 2 MG/ML IJ SOLN: 0.0000 mg | Freq: Four times a day (QID) | INTRAMUSCULAR | Status: AC

## 2019-04-30 MED ORDER — ACETAMINOPHEN 650 MG RE SUPP: 650.0000 mg | Freq: Four times a day (QID) | RECTAL | Status: DC | PRN

## 2019-04-30 MED ORDER — FOLIC ACID 1 MG PO TABS
1.0000 mg | ORAL_TABLET | Freq: Every day | ORAL | Status: DC
  Administered 2019-04-30 – 2019-05-02 (×4): 1 mg via ORAL
  Filled 2019-04-30 (×4): qty 1

## 2019-04-30 MED ORDER — SODIUM CHLORIDE 0.9 % IV SOLN
2.0000 g | Freq: Three times a day (TID) | INTRAVENOUS | Status: DC
  Administered 2019-04-30: 2 g via INTRAVENOUS
  Filled 2019-04-30 (×3): qty 2

## 2019-04-30 MED ORDER — SODIUM CHLORIDE 0.9 % IV BOLUS (SEPSIS)
250.0000 mL | Freq: Once | INTRAVENOUS | Status: AC
  Administered 2019-04-30: 08:00:00 250 mL via INTRAVENOUS

## 2019-04-30 MED ORDER — VANCOMYCIN HCL 1250 MG/250ML IV SOLN
1250.0000 mg | Freq: Two times a day (BID) | INTRAVENOUS | Status: DC
  Filled 2019-04-30 (×2): qty 250

## 2019-04-30 MED ORDER — SODIUM CHLORIDE 0.9 % IV SOLN: INTRAVENOUS | Status: DC

## 2019-04-30 MED ORDER — DOXEPIN HCL 10 MG PO CAPS
20.0000 mg | ORAL_CAPSULE | Freq: Every evening | ORAL | Status: DC | PRN
  Administered 2019-04-30 – 2019-05-01 (×2): 20 mg via ORAL
  Filled 2019-04-30 (×4): qty 2

## 2019-04-30 MED ORDER — VANCOMYCIN HCL IN DEXTROSE 1-5 GM/200ML-% IV SOLN: 1000.0000 mg | Freq: Two times a day (BID) | INTRAVENOUS | Status: DC

## 2019-04-30 MED ORDER — LACTATED RINGERS IV BOLUS
2000.0000 mL | Freq: Once | INTRAVENOUS | Status: AC
  Administered 2019-04-30 (×2): 1000 mL via INTRAVENOUS

## 2019-04-30 MED ORDER — ENOXAPARIN SODIUM 40 MG/0.4ML ~~LOC~~ SOLN
40.0000 mg | SUBCUTANEOUS | Status: DC
  Administered 2019-04-30 – 2019-05-01 (×2): 40 mg via SUBCUTANEOUS
  Filled 2019-04-30 (×3): qty 0.4

## 2019-04-30 MED ORDER — DIVALPROEX SODIUM ER 500 MG PO TB24
1000.0000 mg | ORAL_TABLET | Freq: Every day | ORAL | Status: DC
  Administered 2019-04-30 – 2019-05-02 (×3): 1000 mg via ORAL
  Filled 2019-04-30 (×3): qty 2

## 2019-04-30 MED ORDER — ACETAMINOPHEN 500 MG PO TABS
1000.0000 mg | ORAL_TABLET | Freq: Once | ORAL | Status: AC
  Administered 2019-04-30: 01:00:00 1000 mg via ORAL
  Filled 2019-04-30: qty 2

## 2019-04-30 MED ORDER — VANCOMYCIN HCL IN DEXTROSE 1-5 GM/200ML-% IV SOLN
1000.0000 mg | Freq: Once | INTRAVENOUS | Status: AC
  Administered 2019-04-30: 1000 mg via INTRAVENOUS
  Filled 2019-04-30: qty 200

## 2019-04-30 MED ORDER — CALCIUM CARBONATE ANTACID 500 MG PO CHEW
3.0000 | CHEWABLE_TABLET | Freq: Every day | ORAL | Status: DC
  Administered 2019-04-30 – 2019-05-02 (×3): 600 mg via ORAL
  Filled 2019-04-30 (×3): qty 3

## 2019-04-30 MED ORDER — SODIUM CHLORIDE 0.9 % IV BOLUS
1000.0000 mL | Freq: Once | INTRAVENOUS | Status: AC
  Administered 2019-04-30: 1000 mL via INTRAVENOUS

## 2019-04-30 MED ORDER — HYDROCODONE-ACETAMINOPHEN 5-325 MG PO TABS
1.0000 | ORAL_TABLET | ORAL | Status: DC | PRN
  Administered 2019-04-30 – 2019-05-01 (×4): 2 via ORAL
  Filled 2019-04-30 (×4): qty 2

## 2019-04-30 MED ORDER — SODIUM CHLORIDE 0.9 % IV SOLN
1.0000 g | Freq: Once | INTRAVENOUS | Status: AC
  Administered 2019-04-30: 1 g via INTRAVENOUS
  Filled 2019-04-30: qty 1

## 2019-04-30 MED ORDER — DOCUSATE SODIUM 100 MG PO CAPS
100.0000 mg | ORAL_CAPSULE | Freq: Every day | ORAL | Status: DC
  Administered 2019-04-30 – 2019-05-02 (×3): 100 mg via ORAL
  Filled 2019-04-30 (×3): qty 1

## 2019-04-30 MED ORDER — SODIUM CHLORIDE 0.9 % IV BOLUS (SEPSIS)
1000.0000 mL | Freq: Once | INTRAVENOUS | Status: AC
  Administered 2019-04-30: 1000 mL via INTRAVENOUS

## 2019-04-30 MED ORDER — LORAZEPAM 2 MG/ML IJ SOLN: 1.0000 mg | INTRAMUSCULAR | Status: DC | PRN

## 2019-04-30 MED ORDER — LORAZEPAM 2 MG/ML IJ SOLN: 0.0000 mg | Freq: Two times a day (BID) | INTRAMUSCULAR | Status: DC

## 2019-04-30 MED ORDER — ADULT MULTIVITAMIN W/MINERALS CH: 1.0000 | ORAL_TABLET | Freq: Every day | ORAL | Status: DC

## 2019-04-30 MED ORDER — ONDANSETRON HCL 4 MG PO TABS: 4.0000 mg | ORAL_TABLET | Freq: Four times a day (QID) | ORAL | Status: DC | PRN

## 2019-04-30 MED ORDER — ACETAMINOPHEN 325 MG PO TABS
650.0000 mg | ORAL_TABLET | Freq: Four times a day (QID) | ORAL | Status: DC | PRN
  Administered 2019-04-30: 650 mg via ORAL
  Filled 2019-04-30: qty 2

## 2019-04-30 MED ORDER — THIAMINE HCL 100 MG PO TABS
100.0000 mg | ORAL_TABLET | Freq: Every day | ORAL | Status: DC
  Administered 2019-04-30 – 2019-05-02 (×3): 100 mg via ORAL
  Filled 2019-04-30 (×3): qty 1

## 2019-04-30 MED ORDER — FOLIC ACID 1 MG PO TABS: 1.0000 mg | ORAL_TABLET | Freq: Every day | ORAL | Status: DC

## 2019-04-30 MED ORDER — LORAZEPAM 1 MG PO TABS
1.0000 mg | ORAL_TABLET | ORAL | Status: DC | PRN
  Administered 2019-04-30 (×2): 1 mg via ORAL
  Filled 2019-04-30 (×2): qty 1

## 2019-04-30 MED ORDER — ONDANSETRON HCL 4 MG/2ML IJ SOLN: 4.0000 mg | Freq: Four times a day (QID) | INTRAMUSCULAR | Status: DC | PRN

## 2019-04-30 MED ORDER — THIAMINE HCL 100 MG PO TABS: 100.0000 mg | ORAL_TABLET | Freq: Every day | ORAL | Status: DC

## 2019-04-30 MED ORDER — SODIUM CHLORIDE 0.9 % IV BOLUS
1000.0000 mL | Freq: Once | INTRAVENOUS | Status: AC
  Administered 2019-04-30: 01:00:00 1000 mL via INTRAVENOUS

## 2019-04-30 MED ORDER — SENNOSIDES-DOCUSATE SODIUM 8.6-50 MG PO TABS: 1.0000 | ORAL_TABLET | Freq: Every evening | ORAL | Status: DC | PRN

## 2019-04-30 MED ORDER — METRONIDAZOLE IN NACL 5-0.79 MG/ML-% IV SOLN
500.0000 mg | Freq: Three times a day (TID) | INTRAVENOUS | Status: DC
  Administered 2019-04-30: 08:00:00 500 mg via INTRAVENOUS
  Filled 2019-04-30 (×3): qty 100

## 2019-04-30 MED ORDER — IOHEXOL 350 MG/ML SOLN
100.0000 mL | Freq: Once | INTRAVENOUS | Status: AC | PRN
  Administered 2019-04-30: 05:00:00 100 mL via INTRAVENOUS

## 2019-04-30 MED ORDER — GABAPENTIN 300 MG PO CAPS
300.0000 mg | ORAL_CAPSULE | Freq: Three times a day (TID) | ORAL | Status: DC
  Administered 2019-04-30 – 2019-05-02 (×7): 300 mg via ORAL
  Filled 2019-04-30 (×7): qty 1

## 2019-04-30 NOTE — ED Triage Notes (Signed)
Patient somnolent in triage. Patient with audible wheezes. Patient c/o lower left leg pain. Patient tachycardic and febrile.

## 2019-04-30 NOTE — H&P (Signed)
History and Physical    John [name] PZW:[id number] DOB: 05-14-1980 DOA: 04/30/2019  PCP: Patient, No Pcp Per   Patient coming from: home  I have personally briefly reviewed patient's old medical records in [facility]  Chief Complaint: fall, left leg pain, altered mental status  HPI: John [name] is a 39 y.o. male with medical history significant for alcohol abuse, history of polytrauma with orthopedic hardware to left leg and left sacroiliac joint, history of urethral trauma status post urethroplasty in 2020 at [facility] with cystoscopy on 04/20/2019 with patent urethra and healthy anastomosis, who presents to the emergency room following a fall while at [employer] with complaint of left leg pain from the knee down.  Patient was released from jail earlier in the day and said he felt fine when he left.  He stated that he had 1 drink after he left jail but denies other substance abuse.  On arrival to the emergency room he was very somnolent and complaint mostly of pain in his left knee.  Denied chest pain shortness of breath or fever was noted to have shaking chills.  He denies loss of consciousness or dizziness preceding his fall.  ED Course: On arrival to the emergency room he was very lethargic and had to be grossly shaken to wake to answer questions and then he would fall back asleep after answering.  Temperature 100.7, BP 141/117, pulse 124, respiratory rate 24 with O2 sat initially 84% on room air improving to 89%.  White cell count 14,800 hemoglobin 16.3.Chem 7 mostly unremarkable.  EtOH 115, lactic acid 4.3 procalcitonin less than 0.10.  Flu and Covid test negative.  Urinalysis was sterile with negative leukocyte esterase negative nitrites WBC 0-5 bacteria none seen.  Urine drug screen negative.  EKG sinus rhythm.  Trauma work-up to include x-ray hip left knee and left tib-fib show stable hardware of the left sacroiliac joint, proximal femur and left tibia.  He was noted to have a possible  fracture of the left lateral femoral condyle.  Patient was started on IV fluid bolus and antibiotics for treatment of sepsis of unknown source.  Hospitalist consulted for admission  Review of Systems: Unable to obtain due to lethargy, somnolence  Past Medical History:  Diagnosis Date  . ASD (atrial septal defect)   . Bacterial infection due to H. pylori   . Hypertension   . Kidney stones     Past Surgical History:  Procedure Laterality Date  . ASD REPAIR    . FOOT SURGERY Right   . KNEE SURGERY Right      reports that he has been smoking cigarettes. He has been smoking about 0.50 packs per day. He has never used smokeless tobacco. He reports current alcohol use. He reports that he does not use drugs.  Allergies  Allergen Reactions  . Penicillins     No family history on file.   Prior to Admission medications   Medication Sig Start Date End Date Taking? Authorizing Provider  acetaminophen (TYLENOL) 500 MG tablet Take 500 mg by mouth every 8 (eight) hours as needed for mild pain.   Yes [provider]  calcium carbonate (TUMS - DOSED IN MG ELEMENTAL CALCIUM) 500 MG chewable tablet Chew 3 tablets by mouth daily.   Yes [provider]  Cholecalciferol (VITAMIN D3) 1.25 MG (50000 UT) CAPS Take 50,000 Units by mouth once a week.   Yes [provider]  divalproex (DEPAKOTE ER) 500 MG 24 hr tablet  Take 1,000 mg by mouth daily.   Yes [provider]  docusate sodium (COLACE) 100 MG capsule Take 100 mg by mouth daily.   Yes [provider]  gabapentin (NEURONTIN) 300 MG capsule Take 300 mg by mouth every 8 (eight) hours.   Yes [provider]  Skin Protectants, Misc. (MINERIN CREME) CREA Apply 1 application topically at bedtime.   Yes [provider]  thiamine 100 MG tablet Take 100 mg by mouth daily.   Yes [provider]    Physical Exam: Vitals:   04/30/19 0145 04/30/19 0153 04/30/19 0200 04/30/19 0225  BP:    132/72   Pulse: (!) 118  (!) 113   Resp:  19    Temp:    99.2 F (37.3 C)  TempSrc:    Oral  SpO2: 96%  95%   Weight:         Vitals:   04/30/19 0145 04/30/19 0153 04/30/19 0200 04/30/19 0225  BP:   132/72   Pulse: (!) 118  (!) 113   Resp:  19    Temp:    99.2 F (37.3 C)  TempSrc:    Oral  SpO2: 96%  95%   Weight:        Constitutional: Lethargic and somnolent.,  Unable to assess orientation due to deep somnolence but not in any acute distress .  He is arousable but readily falls back asleep. Unkempt appearance. Eyes: PERLA, EOMI, irises appear normal, anicteric sclera,  ENMT: external ears and nose appear normal, normal hearing             Lips appears normal, oropharynx mucosa, tongue, posterior pharynx appear normal  Neck: neck appears normal, no masses, normal ROM, no thyromegaly, no JVD  CVS: S1-S2 clear, no murmur rubs or gallops,  , no carotid bruits, pedal pulses palpable, No LE edema Respiratory:  clear to auscultation bilaterally, no wheezing, rales or rhonchi. Respiratory effort normal. No accessory muscle use.  Abdomen: soft nontender, nondistended, normal bowel sounds, no hepatosplenomegaly, no hernias Musculoskeletal: : no cyanosis, clubbing , no contractures or atrophy.ROM left knee limited due to pain. No pain on deep palpation hip and pelvis Neuro: Grossly intact, moves all extremities equally, no apparent deficit Psych: Too lethargic to evaluate Skin: no rashes or lesions or ulcers, no induration or nodules   Labs on Admission: I have personally reviewed following labs and imaging studies  CBC: Recent Labs  Lab 04/30/19 0046  WBC 14.8*  NEUTROABS 11.6*  HGB 16.3  HCT 50.7  MCV 90.5  PLT 185   Basic Metabolic Panel: Recent Labs  Lab 04/30/19 0046  NA 136  K 3.8  CL 100  CO2 20*  GLUCOSE 76  BUN 8  CREATININE 0.65  CALCIUM 8.9   GFR: CrCl cannot be calculated (Unknown ideal weight.). Liver Function Tests: Recent Labs  Lab  04/30/19 0046  AST 20  ALT 18  ALKPHOS 90  BILITOT 0.7  PROT 8.5*  ALBUMIN 4.2   No results for input(s): LIPASE, AMYLASE in the last 168 hours. No results for input(s): AMMONIA in the last 168 hours. Coagulation Profile: Recent Labs  Lab 04/30/19 0320  INR 1.1   Cardiac Enzymes: No results for input(s): CKTOTAL, CKMB, CKMBINDEX, TROPONINI in the last 168 hours. BNP (last 3 results) No results for input(s): PROBNP in the last 8760 hours. HbA1C: No results for input(s): HGBA1C in the last 72 hours. CBG: No results for input(s): GLUCAP in the last 168  hours. Lipid Profile: No results for input(s): CHOL, HDL, LDLCALC, TRIG, CHOLHDL, LDLDIRECT in the last 72 hours. Thyroid Function Tests: No results for input(s): TSH, T4TOTAL, FREET4, T3FREE, THYROIDAB in the last 72 hours. Anemia Panel: No results for input(s): VITAMINB12, FOLATE, FERRITIN, TIBC, IRON, RETICCTPCT in the last 72 hours. Urine analysis:    Component Value Date/Time   COLORURINE YELLOW 08/25/2018 2023   APPEARANCEUR HAZY (A) 08/25/2018 2023   APPEARANCEUR Clear 11/27/2013 1926   LABSPEC 1.004 (L) 08/25/2018 2023   LABSPEC 1.001 11/27/2013 1926   PHURINE 8.0 08/25/2018 2023   GLUCOSEU NEGATIVE 08/25/2018 2023   GLUCOSEU Negative 11/27/2013 1926   HGBUR SMALL (A) 08/25/2018 2023   BILIRUBINUR NEGATIVE 08/25/2018 2023   BILIRUBINUR Negative 11/27/2013 1926   KETONESUR NEGATIVE 08/25/2018 2023   PROTEINUR NEGATIVE 08/25/2018 2023   NITRITE NEGATIVE 08/25/2018 2023   LEUKOCYTESUR LARGE (A) 08/25/2018 2023   LEUKOCYTESUR Negative 11/27/2013 1926    Radiological Exams on Admission: DG Tibia/Fibula Left  Result Date: 04/30/2019 CLINICAL DATA:  Suspected sepsis. Lower left leg pain, broad from [employer] parking lot. Released from jail today. Fall. EXAM: LEFT TIBIA AND FIBULA - 2 VIEW COMPARISON:  None. FINDINGS: Intramedullary rod with proximal distal locking screws traverses remote proximal tibial fracture.  There is incomplete fracture healing with fracture lines remaining visible. No periprosthetic lucency. Remote proximal and distal fibular fractures have healed, solid bony bridging distally. Incomplete bony bridging proximally. No evidence of acute fracture. No focal soft tissue abnormality. No periosteal reaction or bony destruction. Bones are under mineralized. IMPRESSION: 1. No evidence of acute fracture of the left tibia or fibula. 2. Intramedullary rod traverses remote proximal tibial fracture with incomplete fracture healing. Remote proximal and distal fibular fractures, incomplete healing proximally. Electronically Signed   By: Keith Rake M.D.   On: 04/30/2019 01:30   DG Chest Port 1 View  Result Date: 04/30/2019 CLINICAL DATA:  Suspected sepsis. Left lower leg pain. EXAM: PORTABLE CHEST 1 VIEW COMPARISON:  Chest radiograph 08/25/2018 FINDINGS: Chronic elevation of left hemidiaphragm with left basilar scarring.The cardiomediastinal contours are normal. Pulmonary vasculature is normal. No consolidation, pleural effusion, or pneumothorax. Remote right clavicle fracture. Surgical hardware in the lumbar spine partially included. No acute osseous abnormalities are seen. IMPRESSION: No acute chest findings. Electronically Signed   By: Keith Rake M.D.   On: 04/30/2019 01:32   DG Knee Complete 4 Views Left  Result Date: 04/30/2019 CLINICAL DATA:  Fall and pain. EXAM: LEFT KNEE - COMPLETE 4+ VIEW COMPARISON:  None. FINDINGS: Cortical irregularity about the lateral femoral condyle may represent an acute fracture. Intramedullary rod with distal locking screw traversing the femoral shaft partially included. There is a small knee joint effusion. Mild peripheral spurring. IMPRESSION: Cortical irregularity about the lateral femoral condyle may represent an acute fracture. Recommend correlation with focal tenderness. Electronically Signed   By: Keith Rake M.D.   On: 04/30/2019 01:34   DG Hip Unilat  W or Wo Pelvis 2-3 Views Left  Result Date: 04/30/2019 CLINICAL DATA:  Fall and pain. EXAM: DG HIP (WITH OR WITHOUT PELVIS) 2-3V LEFT COMPARISON:  None. FINDINGS: Remote left pubic rami fractures. Remote left proximal femur fracture with intramedullary rod and trans trochanteric screw fixation. Screw traverses the left sacroiliac joint and sacrum. There is no evidence of acute fracture. Femoral heads are normally located. IMPRESSION: 1. No acute fracture of the pelvis or left hip. 2. Remote left pubic rami fractures. Surgical hardware traversing the left sacroiliac joint  and left proximal femur without complication. Electronically Signed   By: Narda Rutherford M.D.   On: 04/30/2019 01:36    EKG: Independently reviewed. Sinus tachy  Assessment/Plan Principal Problem:   Severe Sepsis (HCC) of unknown source   Acute metabolic encephalopathy   Hypoxic respiratory failure -Patient presents with fever, tachycardia, respiratory failure, altered mental status elevated white cell count and lactic acid 4.3 -Urinalysis was completely clean.  Patient had recent cystoscopy on 04/20/2019 -Possibility symptoms may be related to an alcohol binge earlier in the day and acute intoxication -We will treat empirically for sepsis of unknown source and that he meets criteria -Covid and flu test negative and procalcitonin less than 0.10 -Empiric IV cefepime, metronidazole and vancomycin pending further work-up for etiology of possible sepsis -Fall and aspiration precautions.  Keep n.p.o. until more awake and alert -Neurologic checks -Oxygen to keep sats over 92% - CT abdomen and pelvis, chest and head with no acute findings -Follow cultures    Alcohol abuse with intoxication (HCC) -Patient has a past history of alcohol abuse.  Has been in jail and on his release on the day of arrival admits to taking 1 drink-EtOH level 115 -CIWA withdrawal protocol --Urine drug screen was negative    Fall   Left leg pain    Possible acute Femoral condyle fracture [facility])   Orthopedic hardware present -Patient with multiple orthopedic hardware.  Trauma evaluation showing only possible left lateral femoral condyle -Pain management -Ace bandage and consider orthopedic evaluation    S/P cystoscopy 04/20/19 [facility]. Hx urethroplasty for urethral trauma in 2020 -No evidence of UTI          DVT prophylaxis: Lovenox  Code Status: full code  Family Communication:  none  Disposition Plan: Back to previous home environment Consults called: none  Status:inp    Andris Baumann MD [facility]     04/30/2019, 4:36 AM
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
## 2019-04-30 NOTE — ED Notes (Signed)
Date and time results received: 04/30/19 0137  (use smartphrase ".now" to insert current time)  Test: Lactic Acid Critical Value: 5.3  Name of Provider Notified: Dr. Don Perking  Orders Received? Or Actions Taken?: Orders Received - See Orders for details
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
## 2019-04-30 NOTE — Progress Notes (Signed)
Pharmacy Antibiotic Note  John [name] is a 39 y.o. male admitted on 04/30/2019 with sepsis.  Pharmacy has been consulted for Cefepime and Vancomycin dosing.  Pt received initial doses in ED  Plan: Cefepime 2gm IV q8hrs  Vancomycin 1500 mg IV Q 12 hrs. Goal AUC 400-550. Expected AUC: 524.1, expected trough level ~ 12.7 SCr used: 0.8   Height: 5\' 9"  (175.3 cm) Weight: 164 lb 14.5 oz (74.8 kg) IBW/kg (Calculated) : 70.7  Temp (24hrs), Avg:100 F (37.8 C), Min:99.2 F (37.3 C), Max:100.7 F (38.2 C)  Recent Labs  Lab 04/30/19 0046 04/30/19 0320  WBC 14.8*  --   CREATININE 0.65  --   LATICACIDVEN 5.3* 4.3*    Estimated Creatinine Clearance: 122.7 mL/min (by C-G formula based on SCr of 0.65 mg/dL).    Allergies  Allergen Reactions  . Penicillins     Antimicrobials this admission: Cefepime 3/6 >>  Vancomycin 3/6 >>   Dose adjustments this admission:   Microbiology results:  BCx:   UCx:    Sputum:    MRSA PCR:   Thank you for allowing pharmacy to be a part of this patient's care.  06/30/19 A 04/30/2019 6:20 AM

## 2019-04-30 NOTE — Progress Notes (Signed)
Notified Webb Silversmith, NP that patient's HR was elevated in the 130s but he was asymptomatic with it and he also asked for sleeping medicine. She placed the order for sleep medication and just said to score his CIWA which he did not require medication for.  No other orders.

## 2019-04-30 NOTE — ED Notes (Signed)
Lab at bedside for blood draw.

## 2019-04-30 NOTE — ED Notes (Signed)
Patient is a difficult stick, one set of blood cultures able to be obtained, difficulty obtaining second d/t multiple failed attempts.  MD notified and requested to start ABX.

## 2019-04-30 NOTE — ED Notes (Signed)
Pt transported to CT ?

## 2019-04-30 NOTE — ED Provider Notes (Addendum)
[facility] Emergency Department Provider Note  ____________________________________________  Time seen: Approximately 2:36 AM  I have reviewed the triage vital signs and the nursing notes.   HISTORY  Chief Complaint Fall   HPI John [name] is a 39 y.o. male with a history of hypertension, kidney stones, alcohol abuse, ASD status post repair who presents for evaluation of left leg pain.  Patient reports that he was released from jail today.  Had 2 mechanical falls while drinking alcohol onto his left leg.  Is complaining of severe pain that starts on his knee and radiates down his leg.  The pain is worse with palpation or movement of the leg.  He denies head trauma or LOC.  He denies IV drug use.  Patient arrives in the emergency room with a fever, tachycardia, tachypneic, and hypoxic.  He denies chest pain or shortness of breath, abdominal pain, nausea or vomiting, back pain, dysuria or hematuria.  Patient reports that he was in usual state of health when he left jail this morning.  Denies any known exposures to Covid.  Denies cough, sore throat or body aches.   Past Medical History:  Diagnosis Date  . ASD (atrial septal defect)   . Bacterial infection due to H. pylori   . Hypertension   . Kidney stones     Patient Active Problem List   Diagnosis Date Noted  . Sepsis (HCC) 04/30/2019  . Fall 04/30/2019  . Left leg pain 04/30/2019  . Orthopedic hardware present 04/30/2019  . S/P cystoscopy 04/20/19 [facility]. Hx urethroplasty for urethral trauma in 2020 04/30/2019  . Alcohol abuse with intoxication (HCC) 04/30/2019  . Acute metabolic encephalopathy 04/30/2019  . Hypoxia 04/30/2019  . Acute Femoral condyle fracture (HCC) 04/30/2019  . Severe sepsis (HCC) 04/30/2019  . Alcohol abuse 01/14/2018  . Substance induced mood disorder (HCC) 01/14/2018    Past Surgical History:  Procedure Laterality Date  . ASD REPAIR    . FOOT SURGERY Right   . KNEE SURGERY  Right     Prior to Admission medications   Medication Sig Start Date End Date Taking? Authorizing Provider  acetaminophen (TYLENOL) 500 MG tablet Take 500 mg by mouth every 8 (eight) hours as needed for mild pain.   Yes [provider]  calcium carbonate (TUMS - DOSED IN MG ELEMENTAL CALCIUM) 500 MG chewable tablet Chew 3 tablets by mouth daily.   Yes [provider]  Cholecalciferol (VITAMIN D3) 1.25 MG (50000 UT) CAPS Take 50,000 Units by mouth once a week.   Yes [provider]  divalproex (DEPAKOTE ER) 500 MG 24 hr tablet Take 1,000 mg by mouth daily.   Yes [provider]  docusate sodium (COLACE) 100 MG capsule Take 100 mg by mouth daily.   Yes [provider]  gabapentin (NEURONTIN) 300 MG capsule Take 300 mg by mouth every 8 (eight) hours.   Yes [provider]  Skin Protectants, Misc. (MINERIN CREME) CREA Apply 1 application topically at bedtime.   Yes [provider]  thiamine 100 MG tablet Take 100 mg by mouth daily.   Yes [provider]    Allergies Penicillins  No family history on file.  Social History Social History   Tobacco Use  . Smoking status: Current Every Day Smoker    Packs/day: 0.50    Types: Cigarettes  . Smokeless tobacco: Never Used  Substance Use Topics  . Alcohol use: Yes    Comment: occasional  .  Drug use: No    Review of Systems  Constitutional: + fever. Eyes: Negative for visual changes. ENT: Negative for sore throat. Neck: No neck pain  Cardiovascular: Negative for chest pain. Respiratory: Negative for shortness of breath. Gastrointestinal: Negative for abdominal pain, vomiting or diarrhea. Genitourinary: Negative for dysuria. Musculoskeletal: Negative for back pain. + L leg pain Skin: Negative for rash. Neurological: Negative for headaches, weakness or numbness. Psych: No SI or HI  ____________________________________________   PHYSICAL EXAM:  VITAL  SIGNS: ED Triage Vitals  Enc Vitals Group     BP 04/30/19 0011 (!) 141/117     Pulse Rate 04/30/19 0011 (!) 124     Resp 04/30/19 0011 (!) 24     Temp 04/30/19 0011 (!) 100.7 F (38.2 C)     Temp Source 04/30/19 0225 Oral     SpO2 04/30/19 0011 (!) 84 %     Weight 04/30/19 0020 164 lb 14.5 oz (74.8 kg)     Height --      Head Circumference --      Peak Flow --      Pain Score 04/30/19 0020 9     Pain Loc --      Pain Edu? --      Excl. in GC? --     Constitutional: Somnolent but easily arousable, oriented x3. HEENT:      Head: Normocephalic and atraumatic.         Eyes: Conjunctivae are normal. Sclera is non-icteric.       Mouth/Throat: Mucous membranes are dry.       Neck: Supple with no signs of meningismus. Cardiovascular: Tachycardic with regular rhythm. Respiratory: Tachypneic, hypoxic to 84% with intermittent expiratory wheezing Gastrointestinal: Soft, non tender, and non distended with positive bowel sounds. No rebound or guarding.  GU exam performed with no evidence of infection, bulla, necrotizing fasciitis Musculoskeletal: Patient with severe tenderness to palpation on the lateral and medial aspect of the left knee with no obvious deformity, no tenderness palpation over the femur or the hip.  No CT and L-spine tenderness.  Neurologic: Normal speech and language. Face is symmetric. Moving all extremities. No gross focal neurologic deficits are appreciated. Skin: Skin is warm, dry and intact. No rash noted. Psychiatric: Mood and affect are normal. Speech and behavior are normal.  ____________________________________________   LABS (all labs ordered are listed, but only abnormal results are displayed)  Labs Reviewed  COMPREHENSIVE METABOLIC PANEL - Abnormal; Notable for the following components:      Result Value   CO2 20 (*)    Total Protein 8.5 (*)    Anion gap 16 (*)    All other components within normal limits  LACTIC ACID, PLASMA - Abnormal; Notable for the  following components:   Lactic Acid, Venous 5.3 (*)    All other components within normal limits  LACTIC ACID, PLASMA - Abnormal; Notable for the following components:   Lactic Acid, Venous 4.3 (*)    All other components within normal limits  CBC WITH DIFFERENTIAL/PLATELET - Abnormal; Notable for the following components:   WBC 14.8 (*)    Neutro Abs 11.6 (*)    Monocytes Absolute 1.4 (*)    All other components within normal limits  URINALYSIS, COMPLETE (UACMP) WITH MICROSCOPIC - Abnormal; Notable for the following components:   Color, Urine STRAW (*)    APPearance CLEAR (*)    Specific Gravity, Urine 1.003 (*)    All other components within normal limits  ETHANOL - Abnormal; Notable for the following components:   Alcohol, Ethyl (B) 115 (*)    All other components within normal limits  BLOOD GAS, VENOUS - Abnormal; Notable for the following components:   Acid-base deficit 2.4 (*)    All other components within normal limits  RESPIRATORY PANEL BY RT PCR (FLU A&B, COVID)  CULTURE, BLOOD (ROUTINE X 2)  CULTURE, BLOOD (ROUTINE X 2)  URINE CULTURE  URINE DRUG SCREEN, QUALITATIVE ([facility] ONLY)  PROTIME-INR  PROCALCITONIN  LACTIC ACID, PLASMA  MAGNESIUM  PHOSPHORUS  CBC  HIV ANTIBODY (ROUTINE TESTING W REFLEX)   ____________________________________________  EKG  ED ECG REPORT I, Nita Sickle, the attending physician, personally viewed and interpreted this ECG.  Sinus tachycardia, rate of 134, normal intervals, normal axis, limited interpretation due to artifact, no ST elevation. ____________________________________________  RADIOLOGY  I have personally reviewed the images performed during this visit and I agree with the Radiologist's read.   Interpretation by Radiologist:  DG Tibia/Fibula Left  Result Date: 04/30/2019 CLINICAL DATA:  Suspected sepsis. Lower left leg pain, broad from [employer] parking lot. Released from jail today. Fall. EXAM: LEFT TIBIA AND FIBULA -  2 VIEW COMPARISON:  None. FINDINGS: Intramedullary rod with proximal distal locking screws traverses remote proximal tibial fracture. There is incomplete fracture healing with fracture lines remaining visible. No periprosthetic lucency. Remote proximal and distal fibular fractures have healed, solid bony bridging distally. Incomplete bony bridging proximally. No evidence of acute fracture. No focal soft tissue abnormality. No periosteal reaction or bony destruction. Bones are under mineralized. IMPRESSION: 1. No evidence of acute fracture of the left tibia or fibula. 2. Intramedullary rod traverses remote proximal tibial fracture with incomplete fracture healing. Remote proximal and distal fibular fractures, incomplete healing proximally. Electronically Signed   By: Narda Rutherford M.D.   On: 04/30/2019 01:30   CT Head Wo Contrast  Result Date: 04/30/2019 CLINICAL DATA:  Homeless patient who fell.  Mental status changes. EXAM: CT HEAD WITHOUT CONTRAST TECHNIQUE: Contiguous axial images were obtained from the base of the skull through the vertex without intravenous contrast. COMPARISON:  12/02/2014 FINDINGS: Brain: The brain shows a normal appearance without evidence of malformation, atrophy, old or acute small or large vessel infarction, mass lesion, hemorrhage, hydrocephalus or extra-axial collection. Vascular: No hyperdense vessel. No evidence of atherosclerotic calcification. Skull: Normal.  No traumatic finding.  No focal bone lesion. Sinuses/Orbits: Sinuses are clear. Orbits appear normal. Mastoids are clear. Other: None significant IMPRESSION: Normal head CT Electronically Signed   By: Paulina Fusi M.D.   On: 04/30/2019 05:14   CT Angio Chest PE W and/or Wo Contrast  Result Date: 04/30/2019 CLINICAL DATA:  39 year old male with history of trauma from a fall. Possible sepsis. EXAM: CT ANGIOGRAPHY CHEST CT ABDOMEN AND PELVIS WITH CONTRAST TECHNIQUE: Multidetector CT imaging of the chest was performed  using the standard protocol during bolus administration of intravenous contrast. Multiplanar CT image reconstructions and MIPs were obtained to evaluate the vascular anatomy. Multidetector CT imaging of the abdomen and pelvis was performed using the standard protocol during bolus administration of intravenous contrast. CONTRAST:  OMNIPAQUE IOHEXOL 350 MG/ML SOLN COMPARISON:  CT the chest, abdomen and pelvis 10/23/2013. FINDINGS: CTA CHEST FINDINGS Comment: Today's study is limited by considerable patient respiratory motion as well as suboptimal contrast bolus. Cardiovascular: Within the limitations of today's examination there are no definite central, lobar or segmental sized filling defects within the pulmonary arterial tree to suggest clinically relevant pulmonary embolism.  Smaller subsegmental sized defects cannot be entirely excluded secondary to the limitations of today's examination. Heart size is normal. There is no significant pericardial fluid, thickening or pericardial calcification. No abnormal high attenuation fluid within the mediastinum to suggest posttraumatic mediastinal hematoma. No evidence of posttraumatic aortic dissection/transection. Mediastinum/Nodes: No pathologically enlarged mediastinal or hilar lymph nodes. Esophagus is unremarkable in appearance. No axillary lymphadenopathy. Lungs/Pleura: Linear area of architectural distortion in the left lower lobe which may reflect subsegmental atelectasis and/or chronic scarring. 1 cm ground-glass attenuation nodule in the anterior aspect of the right upper lobe (axial image 45 of series 5). No pleural effusions. Musculoskeletal: Old healed fracture of the right distal clavicle. Chronic compression fracture of T12 with posterior rod and screw fixation device extending from T11-L1. No acute displaced fractures or aggressive appearing lytic or blastic lesions are noted in the visualized portions of the skeleton. Review of the MIP images confirms  the above findings. CT ABDOMEN and PELVIS FINDINGS Hepatobiliary: No evidence of significant acute traumatic injury to the liver. No suspicious cystic or solid hepatic lesions. No intra or extrahepatic biliary ductal dilatation. Gallbladder is unremarkable in appearance. Pancreas: No evidence of acute traumatic injury to the pancreas. No pancreatic mass. No pancreatic ductal dilatation. No pancreatic or peripancreatic fluid collections or inflammatory changes. Spleen: No evidence of acute traumatic injury to the spleen. Unremarkable. Adrenals/Urinary Tract: No evidence of acute traumatic injury to either kidney or adrenal gland. Bilateral kidneys and adrenal glands are normal in appearance. No hydroureteronephrosis. Urinary bladder is intact and normal in appearance. Stomach/Bowel: No definite evidence of significant acute traumatic injury to the hollow viscera. Normal appearance of the stomach. No pathologic dilatation of small bowel or colon. Normal appendix. Vascular/Lymphatic: No evidence of acute traumatic injury to the abdominal aorta or major arteries/veins of the abdominal or pelvic vasculature. Atherosclerosis, without evidence of aneurysm or dissection in the abdominal or pelvic vasculature. No lymphadenopathy noted in the abdomen or pelvis. Reproductive: Prostate gland and seminal vesicles are unremarkable in appearance. Other: No high attenuation fluid collection within the peritoneal cavity or retroperitoneum to suggest significant posttraumatic hemorrhage. No significant volume of ascites. No pneumoperitoneum. Musculoskeletal: Fixation screw traversing the left sacroiliac joint. Orthopedic fixation hardware in the left proximal femur incompletely imaged. Old healed fractures of the left superior and inferior pubic rami. There are no aggressive appearing lytic or blastic lesions noted in the visualized portions of the skeleton. Review of the MIP images confirms the above findings. IMPRESSION: 1. No  definite pulmonary embolism within the limitations of today's examination. 2. No evidence of significant acute traumatic injury to the chest, abdomen or pelvis. 3. 1 cm ground-glass attenuation nodule in the right upper lobe. This is nonspecific. Initial follow-up with CT at 6-12 months is recommended to confirm persistence. If persistent, repeat CT is recommended every 2 years until 5 years of stability has been established. This recommendation follows the consensus statement: Guidelines for Management of Incidental Pulmonary Nodules Detected on CT Images: From the Fleischner Society 2017; Radiology 2017; 284:228-243. 4. Atherosclerosis. 5. Additional incidental findings, as above. Electronically Signed   By: Trudie Reed M.D.   On: 04/30/2019 05:30   CT ABDOMEN PELVIS W CONTRAST  Result Date: 04/30/2019 CLINICAL DATA:  39 year old male with history of trauma from a fall. Possible sepsis. EXAM: CT ANGIOGRAPHY CHEST CT ABDOMEN AND PELVIS WITH CONTRAST TECHNIQUE: Multidetector CT imaging of the chest was performed using the standard protocol during bolus administration of intravenous contrast. Multiplanar CT image reconstructions and  MIPs were obtained to evaluate the vascular anatomy. Multidetector CT imaging of the abdomen and pelvis was performed using the standard protocol during bolus administration of intravenous contrast. CONTRAST:  100mL OMNIPAQUE IOHEXOL 350 MG/ML SOLN COMPARISON:  CT the chest, abdomen and pelvis 10/23/2013. FINDINGS: CTA CHEST FINDINGS Comment: Today's study is limited by considerable patient respiratory motion as well as suboptimal contrast bolus. Cardiovascular: Within the limitations of today's examination there are no definite central, lobar or segmental sized filling defects within the pulmonary arterial tree to suggest clinically relevant pulmonary embolism. Smaller subsegmental sized defects cannot be entirely excluded secondary to the limitations of today's examination.  Heart size is normal. There is no significant pericardial fluid, thickening or pericardial calcification. No abnormal high attenuation fluid within the mediastinum to suggest posttraumatic mediastinal hematoma. No evidence of posttraumatic aortic dissection/transection. Mediastinum/Nodes: No pathologically enlarged mediastinal or hilar lymph nodes. Esophagus is unremarkable in appearance. No axillary lymphadenopathy. Lungs/Pleura: Linear area of architectural distortion in the left lower lobe which may reflect subsegmental atelectasis and/or chronic scarring. 1 cm ground-glass attenuation nodule in the anterior aspect of the right upper lobe (axial image 45 of series 5). No pleural effusions. Musculoskeletal: Old healed fracture of the right distal clavicle. Chronic compression fracture of T12 with posterior rod and screw fixation device extending from T11-L1. No acute displaced fractures or aggressive appearing lytic or blastic lesions are noted in the visualized portions of the skeleton. Review of the MIP images confirms the above findings. CT ABDOMEN and PELVIS FINDINGS Hepatobiliary: No evidence of significant acute traumatic injury to the liver. No suspicious cystic or solid hepatic lesions. No intra or extrahepatic biliary ductal dilatation. Gallbladder is unremarkable in appearance. Pancreas: No evidence of acute traumatic injury to the pancreas. No pancreatic mass. No pancreatic ductal dilatation. No pancreatic or peripancreatic fluid collections or inflammatory changes. Spleen: No evidence of acute traumatic injury to the spleen. Unremarkable. Adrenals/Urinary Tract: No evidence of acute traumatic injury to either kidney or adrenal gland. Bilateral kidneys and adrenal glands are normal in appearance. No hydroureteronephrosis. Urinary bladder is intact and normal in appearance. Stomach/Bowel: No definite evidence of significant acute traumatic injury to the hollow viscera. Normal appearance of the stomach.  No pathologic dilatation of small bowel or colon. Normal appendix. Vascular/Lymphatic: No evidence of acute traumatic injury to the abdominal aorta or major arteries/veins of the abdominal or pelvic vasculature. Atherosclerosis, without evidence of aneurysm or dissection in the abdominal or pelvic vasculature. No lymphadenopathy noted in the abdomen or pelvis. Reproductive: Prostate gland and seminal vesicles are unremarkable in appearance. Other: No high attenuation fluid collection within the peritoneal cavity or retroperitoneum to suggest significant posttraumatic hemorrhage. No significant volume of ascites. No pneumoperitoneum. Musculoskeletal: Fixation screw traversing the left sacroiliac joint. Orthopedic fixation hardware in the left proximal femur incompletely imaged. Old healed fractures of the left superior and inferior pubic rami. There are no aggressive appearing lytic or blastic lesions noted in the visualized portions of the skeleton. Review of the MIP images confirms the above findings. IMPRESSION: 1. No definite pulmonary embolism within the limitations of today's examination. 2. No evidence of significant acute traumatic injury to the chest, abdomen or pelvis. 3. 1 cm ground-glass attenuation nodule in the right upper lobe. This is nonspecific. Initial follow-up with CT at 6-12 months is recommended to confirm persistence. If persistent, repeat CT is recommended every 2 years until 5 years of stability has been established. This recommendation follows the consensus statement: Guidelines for Management of Incidental Pulmonary  Nodules Detected on CT Images: From the Fleischner Society 2017; Radiology 2017; 284:228-243. 4. Atherosclerosis. 5. Additional incidental findings, as above. Electronically Signed   By: Vinnie Langton M.D.   On: 04/30/2019 05:30   DG Chest Port 1 View  Result Date: 04/30/2019 CLINICAL DATA:  Suspected sepsis. Left lower leg pain. EXAM: PORTABLE CHEST 1 VIEW COMPARISON:   Chest radiograph 08/25/2018 FINDINGS: Chronic elevation of left hemidiaphragm with left basilar scarring.The cardiomediastinal contours are normal. Pulmonary vasculature is normal. No consolidation, pleural effusion, or pneumothorax. Remote right clavicle fracture. Surgical hardware in the lumbar spine partially included. No acute osseous abnormalities are seen. IMPRESSION: No acute chest findings. Electronically Signed   By: Keith Rake M.D.   On: 04/30/2019 01:32   DG Knee Complete 4 Views Left  Result Date: 04/30/2019 CLINICAL DATA:  Fall and pain. EXAM: LEFT KNEE - COMPLETE 4+ VIEW COMPARISON:  None. FINDINGS: Cortical irregularity about the lateral femoral condyle may represent an acute fracture. Intramedullary rod with distal locking screw traversing the femoral shaft partially included. There is a small knee joint effusion. Mild peripheral spurring. IMPRESSION: Cortical irregularity about the lateral femoral condyle may represent an acute fracture. Recommend correlation with focal tenderness. Electronically Signed   By: Keith Rake M.D.   On: 04/30/2019 01:34   DG Hip Unilat W or Wo Pelvis 2-3 Views Left  Result Date: 04/30/2019 CLINICAL DATA:  Fall and pain. EXAM: DG HIP (WITH OR WITHOUT PELVIS) 2-3V LEFT COMPARISON:  None. FINDINGS: Remote left pubic rami fractures. Remote left proximal femur fracture with intramedullary rod and trans trochanteric screw fixation. Screw traverses the left sacroiliac joint and sacrum. There is no evidence of acute fracture. Femoral heads are normally located. IMPRESSION: 1. No acute fracture of the pelvis or left hip. 2. Remote left pubic rami fractures. Surgical hardware traversing the left sacroiliac joint and left proximal femur without complication. Electronically Signed   By: Keith Rake M.D.   On: 04/30/2019 01:36     ____________________________________________   PROCEDURES  Procedure(s) performed: None Procedures Critical Care performed:   Yes  CRITICAL CARE Performed by: Rudene Re  ?  Total critical care time: 45 min  Critical care time was exclusive of separately billable procedures and treating other patients.  Critical care was necessary to treat or prevent imminent or life-threatening deterioration.  Critical care was time spent personally by me on the following activities: development of treatment plan with patient and/or surrogate as well as nursing, discussions with consultants, evaluation of patient's response to treatment, examination of patient, obtaining history from patient or surrogate, ordering and performing treatments and interventions, ordering and review of laboratory studies, ordering and review of radiographic studies, pulse oximetry and re-evaluation of patient's condition.  ____________________________________________   INITIAL IMPRESSION / ASSESSMENT AND PLAN / ED COURSE  39 y.o. male with a history of hypertension, kidney stones, alcohol abuse, ASD status post repair who presents for evaluation of left leg pain s/p mechanical fall.  Upon arrival to the emergency room patient meets sepsis criteria with fever, tachycardia, tachypnea, and hypoxia.  Patient reports feeling well when he was released from jail this morning.  His symptoms started after the fall.  He denies IV drug use.  He denies any localizing signs or symptoms of infection other than leg pain.  There is no evidence of cellulitis, any open wounds to his leg or GU.  No signs of necrotizing fasciitis, no crepitus.  Leg is warm and well-perfused with strong distal pulses.  Abdomen is soft with no tenderness, lungs with intermittent expiratory wheezes but no crackles.  Chest x-ray with no evidence of pneumonia.  X-ray of the left lower extremity showing the lateral femoral condyle fracture.  Patient was a hard stick and blood work was delayed.  Labs showing lactic acidosis of 5.3, white count of 14.8, VBG showing pH of 7.33.  Chemistries are  pending, Covid and flu pending.  IV fluids and cefepime and Vanco given for sepsis.  Urine is pending.    Ddx sepsis with hypoxia and wheezing concerning for PNA, COVID, FLU, viral illness vs urosepsis vs bacteremia. Since patient is clearly intoxicated with get CT head and c/a/p to rule out alternative causes of sepsis since exam is not reliable. CTs negative. Ethanol level done 4 hours after arrival showing EtOH level of 115. Lactic trending down.  Urine negative for UTI.  Chemistries no electrolyte derangements or significant abnormalities.   As part of my medical decision making, I reviewed the following data within the electronic MEDICAL RECORD NUMBER  Imaging which showed acute femoral condyle fracture, CXR with no evidence of PNA, CT head, c/a/p showing no acute pathology. I have  reviewed the EKG and looked at the rhythm strip in the room which showed sinus tachycardia. I have reviewed patient's previous medical records and PMH including recent cystoscopy 2 weeks ago which could have predisposed patient to bacteremia. Labs were reviewed by me and any abnormalities described in my A&P above. IV antibiotics and IVF were given.   Please note:  Patient was evaluated in Emergency Department today for the symptoms described in the history of present illness. Patient was evaluated in the context of the global COVID-19 pandemic, which necessitated consideration that the patient might be at risk for infection with the SARS-CoV-2 virus that causes COVID-19. Institutional protocols and algorithms that pertain to the evaluation of patients at risk for COVID-19 are in a state of rapid change based on information released by regulatory bodies including the CDC and federal and state organizations. These policies and algorithms were followed during the patient's care in the ED.  Some ED evaluations and interventions may be delayed as a result of limited staffing during the  pandemic.   ____________________________________________   FINAL CLINICAL IMPRESSION(S) / ED DIAGNOSES   Final diagnoses:  Fall, initial encounter  Sepsis with encephalopathy without septic shock, due to unspecified organism [facility])  Avulsion fracture of femoral condyle, left, closed, initial encounter (HCC)  Acute respiratory failure with hypoxia (HCC)      NEW MEDICATIONS STARTED DURING THIS VISIT:  ED Discharge Orders    None       Note:  This document was prepared using Dragon voice recognition software and may include unintentional dictation errors.    Don Perking, [place], MD 04/30/19 8315    Nita Sickle, MD 04/30/19 [phone]

## 2019-04-30 NOTE — Progress Notes (Signed)
CODE SEPSIS - PHARMACY COMMUNICATION  **Broad Spectrum Antibiotics should be administered within 1 hour of Sepsis diagnosis**  Time Code Sepsis Called/Page Received: 0301  Antibiotics Ordered: Cefepime, Vancomycin  Time of 1st antibiotic administration: 0154, Cefepime  Additional action taken by pharmacy: n/a  If necessary, Name of Provider/Nurse Contacted: n/a    Wayland Denis ,PharmD Clinical Pharmacist  04/30/2019  3:41 AM

## 2019-04-30 NOTE — ED Notes (Signed)
Pt stuck multiple times for IV and lab draw.  Unable to draw all lab samples due to insufficient draw.  Called lab to inform who will send someone for blood draw.

## 2019-04-30 NOTE — Progress Notes (Signed)
Pharmacy Antibiotic Note  John [name] is a 39 y.o. male admitted on 04/30/2019 with sepsis.  Pharmacy has been consulted for Cefepime and Vancomycin dosing.  Pt received initial doses in ED  Plan: Continue Cefepime 2gm IV q8hrs  Adjust dose to Vancomycin 1250 mg IV Q 12 hrs. Goal AUC 400-550. Expected AUC: 436.8, expected trough level ~ 10.6 SCr used: 0.8   Height: 5\' 9"  (175.3 cm) Weight: 164 lb 14.5 oz (74.8 kg) IBW/kg (Calculated) : 70.7  Temp (24hrs), Avg:99.8 F (37.7 C), Min:99.2 F (37.3 C), Max:100.7 F (38.2 C)  Recent Labs  Lab 04/30/19 0046 04/30/19 0320 04/30/19 0604  WBC 14.8*  --  8.7  CREATININE 0.65  --   --   LATICACIDVEN 5.3* 4.3* 2.8*    Estimated Creatinine Clearance: 122.7 mL/min (by C-G formula based on SCr of 0.65 mg/dL).    Allergies  Allergen Reactions  . Penicillins     Antimicrobials this admission: Cefepime 3/6 >>  Vancomycin 3/6 >>   Microbiology results: BCx: Pending  SARS Coronavirus 2 PCR: negative  UCx:  Pending    Thank you for allowing pharmacy to be a part of this patient's care.  06/30/19, PharmD, BCPS Clinical Pharmacist 04/30/2019 7:27 AM

## 2019-05-01 ENCOUNTER — Inpatient Hospital Stay: Payer: Self-pay

## 2019-05-01 DIAGNOSIS — S72415A Nondisplaced unspecified condyle fracture of lower end of left femur, initial encounter for closed fracture: Secondary | ICD-10-CM

## 2019-05-01 LAB — COMPREHENSIVE METABOLIC PANEL
ALT: 17 U/L (ref 0–44)
AST: 24 U/L (ref 15–41)
Albumin: 2.9 g/dL — ABNORMAL LOW (ref 3.5–5.0)
Alkaline Phosphatase: 61 U/L (ref 38–126)
Anion gap: 10 (ref 5–15)
BUN: 13 mg/dL (ref 6–20)
CO2: 27 mmol/L (ref 22–32)
Calcium: 8.3 mg/dL — ABNORMAL LOW (ref 8.9–10.3)
Chloride: 103 mmol/L (ref 98–111)
Creatinine, Ser: 0.83 mg/dL (ref 0.61–1.24)
GFR calc Af Amer: >60 mL/min (ref >60)
GFR calc non Af Amer: >60 mL/min (ref >60)
Glucose, Bld: 118 mg/dL — ABNORMAL HIGH (ref 70–99)
Potassium: 3.4 mmol/L — ABNORMAL LOW (ref 3.5–5.1)
Sodium: 140 mmol/L (ref 135–145)
Total Bilirubin: 0.5 mg/dL (ref 0.3–1.2)
Total Protein: 5.8 g/dL — ABNORMAL LOW (ref 6.5–8.1)

## 2019-05-01 LAB — URINE CULTURE: Culture: <10000 — AB

## 2019-05-01 LAB — CBC
HCT: 34.7 % — ABNORMAL LOW (ref 39.0–52.0)
Hemoglobin: 11.4 g/dL — ABNORMAL LOW (ref 13.0–17.0)
MCH: 28.9 pg (ref 26.0–34.0)
MCHC: 32.9 g/dL (ref 30.0–36.0)
MCV: 88.1 fL (ref 80.0–100.0)
Platelets: 121 10*3/uL — ABNORMAL LOW (ref 150–400)
RBC: 3.94 MIL/uL — ABNORMAL LOW (ref 4.22–5.81)
RDW: 13.8 % (ref 11.5–15.5)
WBC: 6.7 10*3/uL (ref 4.0–10.5)
nRBC: 0 % (ref 0.0–0.2)

## 2019-05-01 LAB — PROTIME-INR
INR: 1.2 (ref 0.8–1.2)
Prothrombin Time: 14.8 seconds (ref 11.4–15.2)

## 2019-05-01 LAB — CORTISOL-AM, BLOOD: Cortisol - AM: 9.2 ug/dL (ref 6.7–22.6)

## 2019-05-01 LAB — PROCALCITONIN: Procalcitonin: <0.1 ng/mL

## 2019-05-01 MED ORDER — POTASSIUM CHLORIDE CRYS ER 20 MEQ PO TBCR
40.0000 meq | EXTENDED_RELEASE_TABLET | Freq: Once | ORAL | Status: AC
  Administered 2019-05-01: 40 meq via ORAL
  Filled 2019-05-01: qty 2

## 2019-05-01 MED ORDER — SODIUM CHLORIDE 0.9 % IV BOLUS: 2000.0000 mL | Freq: Once | INTRAVENOUS | Status: DC

## 2019-05-01 NOTE — Consult Note (Signed)
ORTHOPAEDIC CONSULTATION  REQUESTING PHYSICIAN: Salomon Mast*  Chief Complaint: Left knee and ankle pain  HPI: John [name] is a 39 y.o. male who complains of left knee and ankle pain.  The patient was admitted 2 days ago for possible sepsis.  He also had just been not out of the jail and became acutely intoxicated with a blood alcohol level of 0.11.  Apparently fell several times.  Complained of pain in the left knee and subsequently left ankle.  Patient suffered a severe injury to the left lower leg in January 2020 and had multiple surgeries at [facility] with Intramedullary rodding of the left femur and left tibia.  Patient has been walking with a cane.  X-rays of the left knee showed a possible hairline fracture of the left lateral femoral condyle.  X-ray of the left ankle showed avulsion of the distal fibula on the left of undetermined age.  He is now ambulatory with a walker partial weightbearing on the left.  He has a knee immobilizer and Ace bandage on the left knee.  Past Medical History:  Diagnosis Date  . ASD (atrial septal defect)   . Bacterial infection due to H. pylori   . Hypertension   . Kidney stones    Past Surgical History:  Procedure Laterality Date  . ASD REPAIR    . FOOT SURGERY Right   . KNEE SURGERY Right    Social History   Socioeconomic History  . Marital status: Single    Spouse name: Not on file  . Number of children: Not on file  . Years of education: Not on file  . Highest education level: Not on file  Occupational History  . Not on file  Tobacco Use  . Smoking status: Current Every Day Smoker    Packs/day: 0.50    Types: Cigarettes  . Smokeless tobacco: Never Used  Substance and Sexual Activity  . Alcohol use: Yes    Comment: occasional  . Drug use: No  . Sexual activity: Yes    Birth control/protection: None  Other Topics Concern  . Not on file  Social History Narrative  . Not on file   Social Determinants of Health    Financial Resource Strain:   . Difficulty of Paying Living Expenses: Not on file  Food Insecurity:   . Worried About Programme researcher, broadcasting/film/video in the Last Year: Not on file  . Ran Out of Food in the Last Year: Not on file  Transportation Needs:   . Lack of Transportation (Medical): Not on file  . Lack of Transportation (Non-Medical): Not on file  Physical Activity:   . Days of Exercise per Week: Not on file  . Minutes of Exercise per Session: Not on file  Stress:   . Feeling of Stress : Not on file  Social Connections:   . Frequency of Communication with Friends and Family: Not on file  . Frequency of Social Gatherings with Friends and Family: Not on file  . Attends Religious Services: Not on file  . Active Member of Clubs or Organizations: Not on file  . Attends Banker Meetings: Not on file  . Marital Status: Not on file   No family history on file. Allergies  Allergen Reactions  . Penicillins    Prior to Admission medications   Medication Sig Start Date End Date Taking? Authorizing Provider  acetaminophen (TYLENOL) 500 MG tablet Take 500 mg by mouth every 8 (eight) hours as needed for  mild pain.   Yes [provider]  calcium carbonate (TUMS - DOSED IN MG ELEMENTAL CALCIUM) 500 MG chewable tablet Chew 3 tablets by mouth daily.   Yes [provider]  Cholecalciferol (VITAMIN D3) 1.25 MG (50000 UT) CAPS Take 50,000 Units by mouth once a week.   Yes [provider]  divalproex (DEPAKOTE ER) 500 MG 24 hr tablet Take 1,000 mg by mouth daily.   Yes [provider]  docusate sodium (COLACE) 100 MG capsule Take 100 mg by mouth daily.   Yes [provider]  gabapentin (NEURONTIN) 300 MG capsule Take 300 mg by mouth every 8 (eight) hours.   Yes [provider]  Skin Protectants, Misc. (MINERIN CREME) CREA Apply 1 application topically at bedtime.   Yes [provider]  thiamine 100 MG tablet Take 100 mg by mouth  daily.   Yes [provider]   DG Tibia/Fibula Left  Result Date: 04/30/2019 CLINICAL DATA:  Suspected sepsis. Lower left leg pain, broad from [employer] parking lot. Released from jail today. Fall. EXAM: LEFT TIBIA AND FIBULA - 2 VIEW COMPARISON:  None. FINDINGS: Intramedullary rod with proximal distal locking screws traverses remote proximal tibial fracture. There is incomplete fracture healing with fracture lines remaining visible. No periprosthetic lucency. Remote proximal and distal fibular fractures have healed, solid bony bridging distally. Incomplete bony bridging proximally. No evidence of acute fracture. No focal soft tissue abnormality. No periosteal reaction or bony destruction. Bones are under mineralized. IMPRESSION: 1. No evidence of acute fracture of the left tibia or fibula. 2. Intramedullary rod traverses remote proximal tibial fracture with incomplete fracture healing. Remote proximal and distal fibular fractures, incomplete healing proximally. Electronically Signed   By: Narda Rutherford M.D.   On: 04/30/2019 01:30   CT Head Wo Contrast  Result Date: 04/30/2019 CLINICAL DATA:  Homeless patient who fell.  Mental status changes. EXAM: CT HEAD WITHOUT CONTRAST TECHNIQUE: Contiguous axial images were obtained from the base of the skull through the vertex without intravenous contrast. COMPARISON:  12/02/2014 FINDINGS: Brain: The brain shows a normal appearance without evidence of malformation, atrophy, old or acute small or large vessel infarction, mass lesion, hemorrhage, hydrocephalus or extra-axial collection. Vascular: No hyperdense vessel. No evidence of atherosclerotic calcification. Skull: Normal.  No traumatic finding.  No focal bone lesion. Sinuses/Orbits: Sinuses are clear. Orbits appear normal. Mastoids are clear. Other: None significant IMPRESSION: Normal head CT Electronically Signed   By: Paulina Fusi M.D.   On: 04/30/2019 05:14   CT Angio Chest PE W and/or Wo  Contrast  Result Date: 04/30/2019 CLINICAL DATA:  39 year old male with history of trauma from a fall. Possible sepsis. EXAM: CT ANGIOGRAPHY CHEST CT ABDOMEN AND PELVIS WITH CONTRAST TECHNIQUE: Multidetector CT imaging of the chest was performed using the standard protocol during bolus administration of intravenous contrast. Multiplanar CT image reconstructions and MIPs were obtained to evaluate the vascular anatomy. Multidetector CT imaging of the abdomen and pelvis was performed using the standard protocol during bolus administration of intravenous contrast. CONTRAST:  OMNIPAQUE IOHEXOL 350 MG/ML SOLN COMPARISON:  CT the chest, abdomen and pelvis 10/23/2013. FINDINGS: CTA CHEST FINDINGS Comment: Today's study is limited by considerable patient respiratory motion as well as suboptimal contrast bolus. Cardiovascular: Within the limitations of today's examination there are no definite central, lobar or segmental sized filling defects within the pulmonary arterial tree to suggest clinically relevant pulmonary embolism. Smaller subsegmental sized defects cannot be entirely excluded secondary to the limitations  of today's examination. Heart size is normal. There is no significant pericardial fluid, thickening or pericardial calcification. No abnormal high attenuation fluid within the mediastinum to suggest posttraumatic mediastinal hematoma. No evidence of posttraumatic aortic dissection/transection. Mediastinum/Nodes: No pathologically enlarged mediastinal or hilar lymph nodes. Esophagus is unremarkable in appearance. No axillary lymphadenopathy. Lungs/Pleura: Linear area of architectural distortion in the left lower lobe which may reflect subsegmental atelectasis and/or chronic scarring. 1 cm ground-glass attenuation nodule in the anterior aspect of the right upper lobe (axial image 45 of series 5). No pleural effusions. Musculoskeletal: Old healed fracture of the right distal clavicle. Chronic compression  fracture of T12 with posterior rod and screw fixation device extending from T11-L1. No acute displaced fractures or aggressive appearing lytic or blastic lesions are noted in the visualized portions of the skeleton. Review of the MIP images confirms the above findings. CT ABDOMEN and PELVIS FINDINGS Hepatobiliary: No evidence of significant acute traumatic injury to the liver. No suspicious cystic or solid hepatic lesions. No intra or extrahepatic biliary ductal dilatation. Gallbladder is unremarkable in appearance. Pancreas: No evidence of acute traumatic injury to the pancreas. No pancreatic mass. No pancreatic ductal dilatation. No pancreatic or peripancreatic fluid collections or inflammatory changes. Spleen: No evidence of acute traumatic injury to the spleen. Unremarkable. Adrenals/Urinary Tract: No evidence of acute traumatic injury to either kidney or adrenal gland. Bilateral kidneys and adrenal glands are normal in appearance. No hydroureteronephrosis. Urinary bladder is intact and normal in appearance. Stomach/Bowel: No definite evidence of significant acute traumatic injury to the hollow viscera. Normal appearance of the stomach. No pathologic dilatation of small bowel or colon. Normal appendix. Vascular/Lymphatic: No evidence of acute traumatic injury to the abdominal aorta or major arteries/veins of the abdominal or pelvic vasculature. Atherosclerosis, without evidence of aneurysm or dissection in the abdominal or pelvic vasculature. No lymphadenopathy noted in the abdomen or pelvis. Reproductive: Prostate gland and seminal vesicles are unremarkable in appearance. Other: No high attenuation fluid collection within the peritoneal cavity or retroperitoneum to suggest significant posttraumatic hemorrhage. No significant volume of ascites. No pneumoperitoneum. Musculoskeletal: Fixation screw traversing the left sacroiliac joint. Orthopedic fixation hardware in the left proximal femur incompletely imaged.  Old healed fractures of the left superior and inferior pubic rami. There are no aggressive appearing lytic or blastic lesions noted in the visualized portions of the skeleton. Review of the MIP images confirms the above findings. IMPRESSION: 1. No definite pulmonary embolism within the limitations of today's examination. 2. No evidence of significant acute traumatic injury to the chest, abdomen or pelvis. 3. 1 cm ground-glass attenuation nodule in the right upper lobe. This is nonspecific. Initial follow-up with CT at 6-12 months is recommended to confirm persistence. If persistent, repeat CT is recommended every 2 years until 5 years of stability has been established. This recommendation follows the consensus statement: Guidelines for Management of Incidental Pulmonary Nodules Detected on CT Images: From the Fleischner Society 2017; Radiology 2017; [id number]. 4. Atherosclerosis. 5. Additional incidental findings, as above. Electronically Signed   By: Trudie Reedaniel  Entrikin M.D.   On: 04/30/2019 05:30   CT ABDOMEN PELVIS W CONTRAST  Result Date: 04/30/2019 CLINICAL DATA:  39 year old male with history of trauma from a fall. Possible sepsis. EXAM: CT ANGIOGRAPHY CHEST CT ABDOMEN AND PELVIS WITH CONTRAST TECHNIQUE: Multidetector CT imaging of the chest was performed using the standard protocol during bolus administration of intravenous contrast. Multiplanar CT image reconstructions and MIPs were obtained to evaluate the vascular anatomy. Multidetector CT imaging of  the abdomen and pelvis was performed using the standard protocol during bolus administration of intravenous contrast. CONTRAST:  155mL OMNIPAQUE IOHEXOL 350 MG/ML SOLN COMPARISON:  CT the chest, abdomen and pelvis 10/23/2013. FINDINGS: CTA CHEST FINDINGS Comment: Today's study is limited by considerable patient respiratory motion as well as suboptimal contrast bolus. Cardiovascular: Within the limitations of today's examination there are no definite central,  lobar or segmental sized filling defects within the pulmonary arterial tree to suggest clinically relevant pulmonary embolism. Smaller subsegmental sized defects cannot be entirely excluded secondary to the limitations of today's examination. Heart size is normal. There is no significant pericardial fluid, thickening or pericardial calcification. No abnormal high attenuation fluid within the mediastinum to suggest posttraumatic mediastinal hematoma. No evidence of posttraumatic aortic dissection/transection. Mediastinum/Nodes: No pathologically enlarged mediastinal or hilar lymph nodes. Esophagus is unremarkable in appearance. No axillary lymphadenopathy. Lungs/Pleura: Linear area of architectural distortion in the left lower lobe which may reflect subsegmental atelectasis and/or chronic scarring. 1 cm ground-glass attenuation nodule in the anterior aspect of the right upper lobe (axial image 45 of series 5). No pleural effusions. Musculoskeletal: Old healed fracture of the right distal clavicle. Chronic compression fracture of T12 with posterior rod and screw fixation device extending from T11-L1. No acute displaced fractures or aggressive appearing lytic or blastic lesions are noted in the visualized portions of the skeleton. Review of the MIP images confirms the above findings. CT ABDOMEN and PELVIS FINDINGS Hepatobiliary: No evidence of significant acute traumatic injury to the liver. No suspicious cystic or solid hepatic lesions. No intra or extrahepatic biliary ductal dilatation. Gallbladder is unremarkable in appearance. Pancreas: No evidence of acute traumatic injury to the pancreas. No pancreatic mass. No pancreatic ductal dilatation. No pancreatic or peripancreatic fluid collections or inflammatory changes. Spleen: No evidence of acute traumatic injury to the spleen. Unremarkable. Adrenals/Urinary Tract: No evidence of acute traumatic injury to either kidney or adrenal gland. Bilateral kidneys and adrenal  glands are normal in appearance. No hydroureteronephrosis. Urinary bladder is intact and normal in appearance. Stomach/Bowel: No definite evidence of significant acute traumatic injury to the hollow viscera. Normal appearance of the stomach. No pathologic dilatation of small bowel or colon. Normal appendix. Vascular/Lymphatic: No evidence of acute traumatic injury to the abdominal aorta or major arteries/veins of the abdominal or pelvic vasculature. Atherosclerosis, without evidence of aneurysm or dissection in the abdominal or pelvic vasculature. No lymphadenopathy noted in the abdomen or pelvis. Reproductive: Prostate gland and seminal vesicles are unremarkable in appearance. Other: No high attenuation fluid collection within the peritoneal cavity or retroperitoneum to suggest significant posttraumatic hemorrhage. No significant volume of ascites. No pneumoperitoneum. Musculoskeletal: Fixation screw traversing the left sacroiliac joint. Orthopedic fixation hardware in the left proximal femur incompletely imaged. Old healed fractures of the left superior and inferior pubic rami. There are no aggressive appearing lytic or blastic lesions noted in the visualized portions of the skeleton. Review of the MIP images confirms the above findings. IMPRESSION: 1. No definite pulmonary embolism within the limitations of today's examination. 2. No evidence of significant acute traumatic injury to the chest, abdomen or pelvis. 3. 1 cm ground-glass attenuation nodule in the right upper lobe. This is nonspecific. Initial follow-up with CT at 6-12 months is recommended to confirm persistence. If persistent, repeat CT is recommended every 2 years until 5 years of stability has been established. This recommendation follows the consensus statement: Guidelines for Management of Incidental Pulmonary Nodules Detected on CT Images: From the Fleischner Society 2017; Radiology 2017;  [id number]. 4. Atherosclerosis. 5. Additional incidental  findings, as above. Electronically Signed   By: Trudie Reed M.D.   On: 04/30/2019 05:30   DG Chest Port 1 View  Result Date: 04/30/2019 CLINICAL DATA:  Suspected sepsis. Left lower leg pain. EXAM: PORTABLE CHEST 1 VIEW COMPARISON:  Chest radiograph 08/25/2018 FINDINGS: Chronic elevation of left hemidiaphragm with left basilar scarring.The cardiomediastinal contours are normal. Pulmonary vasculature is normal. No consolidation, pleural effusion, or pneumothorax. Remote right clavicle fracture. Surgical hardware in the lumbar spine partially included. No acute osseous abnormalities are seen. IMPRESSION: No acute chest findings. Electronically Signed   By: Narda Rutherford M.D.   On: 04/30/2019 01:32   DG Knee Complete 4 Views Left  Result Date: 04/30/2019 CLINICAL DATA:  Fall and pain. EXAM: LEFT KNEE - COMPLETE 4+ VIEW COMPARISON:  None. FINDINGS: Cortical irregularity about the lateral femoral condyle may represent an acute fracture. Intramedullary rod with distal locking screw traversing the femoral shaft partially included. There is a small knee joint effusion. Mild peripheral spurring. IMPRESSION: Cortical irregularity about the lateral femoral condyle may represent an acute fracture. Recommend correlation with focal tenderness. Electronically Signed   By: Narda Rutherford M.D.   On: 04/30/2019 01:34   DG Foot 2 Views Left  Result Date: 05/01/2019 CLINICAL DATA:  Post fall now with persistent left foot pain. EXAM: LEFT FOOT - 2 VIEW COMPARISON:  None. FINDINGS: Osteopenia. The distal end of a tibial intramedullary rod is incompletely evaluated though without definitive evidence of hardware failure or loosening. Age-indeterminate though likely chronic avulsion fracture involving the distal tip of the medial malleolus. Deformity involving the distal end of the fibula, likely the sequela of remote fracture. No additional fractures identified. Joint spaces appear preserved given obliquity. No  significant hallux valgus deformity. No plantar calcaneal spur. Regional soft tissues appear normal. IMPRESSION: 1. Age-indeterminate, though likely chronic, avulsion fracture involving the distal tip of the medial malleolus. Correlation for point tenderness at this location is advised. 2. Otherwise, osteopenia without definitive acute fracture or dislocation. 3. Post intramedullary rod fixation of the distal tibia without evidence of hardware failure or loosening. 4. Deformity involving the distal end of the fibula, likely the sequela of remote fracture. Electronically Signed   By: Simonne Come M.D.   On: 05/01/2019 10:32   DG Hip Unilat W or Wo Pelvis 2-3 Views Left  Result Date: 04/30/2019 CLINICAL DATA:  Fall and pain. EXAM: DG HIP (WITH OR WITHOUT PELVIS) 2-3V LEFT COMPARISON:  None. FINDINGS: Remote left pubic rami fractures. Remote left proximal femur fracture with intramedullary rod and trans trochanteric screw fixation. Screw traverses the left sacroiliac joint and sacrum. There is no evidence of acute fracture. Femoral heads are normally located. IMPRESSION: 1. No acute fracture of the pelvis or left hip. 2. Remote left pubic rami fractures. Surgical hardware traversing the left sacroiliac joint and left proximal femur without complication. Electronically Signed   By: Narda Rutherford M.D.   On: 04/30/2019 01:36    Positive ROS: All other systems have been reviewed and were otherwise negative with the exception of those mentioned in the HPI and as above.  Physical Exam: General: Alert, no acute distress Cardiovascular: No pedal edema Respiratory: No cyanosis, no use of accessory musculature GI: No organomegaly, abdomen is soft and non-tender Skin: No lesions in the area of chief complaint Neurologic: Sensation intact distally Psychiatric: Patient is competent for consent with normal mood and affect Lymphatic: No axillary or cervical lymphadenopathy  MUSCULOSKELETAL: Patient  is ambulatory  on a walker.  Left knee is minimal swelling.  He is a little tender along the medial and lateral condyles.  There is no deformity.  There is minimal laxity of the knee.  All wounds are well-healed around the knee.  Fractures are stable.  Left ankle has mild swelling and mild tenderness over the distal fibula.  Some of this may be due to the overly tight Ace bandage above this.  Neurovascular status good distally in ankle and foot motion is normal.  Assessment: 1 possible hairline fracture left femoral condyle. 2 probable old avulsion left distal fibula  Plan: Ace bandage and knee immobilizer.  Ace bandage or walking boot for the ankle.  To 20% weightbearing on the left. Follow-up in our office in 7 to 10 days. Enteric-coated aspirin 3 and 25 mg daily.    Valinda Hoar, MD [phone]   05/01/2019 5:15 PM

## 2019-05-01 NOTE — TOC Progression Note (Signed)
Transition of Care [facility]) - Progression Note    Patient Details  Name: John [name] MRN: [id number] Date of Birth: Mar 14, 1980  [facility] Care [facility] CM/SW Contact  Maud Deed, LCSW Phone Number:[phone] 05/01/2019, 2:10 PM  Clinical Narrative:    [facility] consulted for discharge of pt. Nurse notified CSW that pt has no where to go. CSW spoke with pt and he stated that he has a friend he can call and agreed to a shelter. He also stated that he could not find his thermal shirt and needed  something warm to leave. CSW provided him with resource list and sweatshirt, sweatpants and long sleeved shirts.         Expected Discharge Plan and Services                                                 Social Determinants of Health (SDOH) Interventions    Readmission Risk Interventions No flowsheet data found.

## 2019-05-01 NOTE — Progress Notes (Signed)
PROGRESS NOTE    John [name]  LKJ:[id number]  DOB: 03/26/1980  DOA: 04/30/2019 PCP: Patient, No Pcp Per Outpatient Specialists:   Hospital course:  39 year old man was admitted 04/29/2019, the day that he was discharged from jail, acutely intoxicated and with altered mental status.  Patient met criteria for sepsis although procalcitonin was less than 0.1.  Tox screens negative.  Chest x-ray is negative.  Covid is negative.   Subjective:  Patient complains of pain in his left foot and ankle.  Is also worried about where he will go after discharge.   Objective: Vitals:   04/30/19 2226 04/30/19 2335 05/01/19 0414 05/01/19 1325  BP: 119/69 105/62 99/70 127/68  Pulse: (!) 108 95 93 97  Resp: _0 Temp: 98.2 F (36.8 C) 99.6 F (37.6 C) 98.3 F (36.8 C) 98.2 F (36.8 C)  TempSrc: Oral Oral Oral Oral  SpO2: 94% 93% 95% 98%  Weight:      Height:        Intake/Output Summary (Last 24 hours) at 05/01/2019 1623 Last data filed at 05/01/2019 1429 Gross per 24 hour  Intake 1458.92 ml  Output 2950 ml  Net -1491.08 ml   Filed Weights   04/30/19 0020 04/30/19 0600 04/30/19 0834  Weight: 74.8 kg 74.8 kg (S) 77.1 kg     Assessment & Plan:  ` Hypotension/tachycardia Patient was thought to be septic when he presented with hypotension however procalcitonin was negative and no clear source for infection on imaging.  Antibiotics were discontinued yesterday and patient has maintained his blood pressure however remains tachycardic.   Will fluid resuscitate with 2 L bolus for likely some level of intravascular volume depletion Would like to see resolution of tachycardia with fluid resuscitation.  Left foot/ankle/knee pain X-rays revealed possible left lateral femoral condyle fracture and possible chronic avulsion of medial malleolus.  Discussed with orthopedics who recommend splinting of knee with a cam walker to stabilize ankle with follow-up with them in 5 days as an  outpatient. Patient usually walks with a cane at baseline due to previous traumas.  Metabolic encephalopathy Patient is without any confusion, patient was most likely just intoxicated upon admission  Hypoxic respiratory failure Patient is doing well off of any oxygen.  Alcoholic abuse with intoxication Resolved  Abnormal chest CT Chest CT report: 1 cm ground-glass attenuation nodule in the right upper lobe. This is nonspecific.  Initial follow-up with CT at 6-12 months is recommended to confirm persistence.  If persistent, repeat CT is recommended every 2 years until 5 years of stability has been established.    DVT prophylaxis: Lovenox Code Status: Full Family Communication: Patient does not want me to contact his father Disposition Plan:   Patient is from: Just discharged from jail, presently homeless  Anticipated Discharge Location: Shelter or friend  Barriers to Discharge: Tachycardia, getting fluid bolus  Is patient medically stable for Discharge: Yes   Consultants:  Orthopedic  Procedures:  None  Antimicrobials:  Cefepime, vancomycin and Flagyl started 04/29/2019, discontinued 04/30/2019   Exam:  General: Patient sitting up in bed complaining of knee pain in no acute distress looking much more sober alert and present. Eyes: sclera anicteric, conjuctiva mild injection bilaterally CVS: S1-S2, regular  Respiratory:  decreased air entry bilaterally secondary to decreased inspiratory effort, rales at bases  GI: NABS, soft, NT  LE: No edema, he does have acute tenderness to palpation along his medial and lateral femoral condyles as well as his lateral  malleolus on the left leg. Neuro: grossly nonfocal.  Psych: patient is cooperative, judgment appears intact, affect slightly anxious    Data Reviewed: Basic Metabolic Panel: Recent Labs  Lab 04/30/19 0046 04/30/19 0604 05/01/19 0558  NA 136  --  140  K 3.8  --  3.4*  CL 100  --  103  CO2 20*  --  27  GLUCOSE  76  --  118*  BUN 8  --  13  CREATININE 0.65  --  0.83  CALCIUM 8.9  --  8.3*  MG  --  1.8  --   PHOS  --  4.0  --    Liver Function Tests: Recent Labs  Lab 04/30/19 0046 05/01/19 0558  AST 20 24  ALT 18 17  ALKPHOS 90 61  BILITOT 0.7 0.5  PROT 8.5* 5.8*  ALBUMIN 4.2 2.9*   No results for input(s): LIPASE, AMYLASE in the last 168 hours. No results for input(s): AMMONIA in the last 168 hours. CBC: Recent Labs  Lab 04/30/19 0046 04/30/19 0604 05/01/19 0558  WBC 14.8* 8.7 6.7  NEUTROABS 11.6*  --   --   HGB 16.3 12.9* 11.4*  HCT 50.7 39.1 34.7*  MCV 90.5 87.1 88.1  PLT 185 150 121*   Cardiac Enzymes: No results for input(s): CKTOTAL, CKMB, CKMBINDEX, TROPONINI in the last 168 hours. BNP (last 3 results) No results for input(s): PROBNP in the last 8760 hours. CBG: No results for input(s): GLUCAP in the last 168 hours.  Recent Results (from the past 240 hour(s))  Respiratory Panel by RT PCR (Flu A&B, Covid) - Nasopharyngeal Swab     Status: None   Collection Time: 04/30/19 12:46 AM   Specimen: Nasopharyngeal Swab  Result Value Ref Range Status   SARS Coronavirus 2 by RT PCR NEGATIVE NEGATIVE Final    Comment: (NOTE) SARS-CoV-2 target nucleic acids are NOT DETECTED. The SARS-CoV-2 RNA is generally detectable in upper respiratoy specimens during the acute phase of infection. The lowest concentration of SARS-CoV-2 viral copies this assay can detect is 131 copies/mL. A negative result does not preclude SARS-Cov-2 infection and should not be used as the sole basis for treatment or other patient management decisions. A negative result may occur with  improper specimen collection/handling, submission of specimen other than nasopharyngeal swab, presence of viral mutation(s) within the areas targeted by this assay, and inadequate number of viral copies (<131 copies/mL). A negative result must be combined with clinical observations, patient history, and epidemiological  information. The expected result is Negative. Fact Sheet for Patients:  PinkCheek.be Fact Sheet for Healthcare Providers:  GravelBags.it This test is not yet ap proved or cleared by the [place] FDA and  has been authorized for detection and/or diagnosis of SARS-CoV-2 by FDA under an Emergency Use Authorization (EUA). This EUA will remain  in effect (meaning this test can be used) for the duration of the COVID-19 declaration under Section 564(b)(1) of the Act, 21 U.S.C. section 360bbb-3(b)(1), unless the authorization is terminated or revoked sooner.    Influenza A by PCR NEGATIVE NEGATIVE Final   Influenza B by PCR NEGATIVE NEGATIVE Final    Comment: (NOTE) The Xpert Xpress SARS-CoV-2/FLU/RSV assay is intended as an aid in  the diagnosis of influenza from Nasopharyngeal swab specimens and  should not be used as a sole basis for treatment. Nasal washings and  aspirates are unacceptable for Xpert Xpress SARS-CoV-2/FLU/RSV  testing. Fact Sheet for Patients: PinkCheek.be Fact Sheet for Healthcare Providers: GravelBags.it  This test is not yet approved or cleared by the [place] and  has been authorized for detection and/or diagnosis of SARS-CoV-2 by  FDA under an Emergency Use Authorization (EUA). This EUA will remain  in effect (meaning this test can be used) for the duration of the  Covid-19 declaration under Section 564(b)(1) of the Act, 21  U.S.C. section 360bbb-3(b)(1), unless the authorization is  terminated or revoked. Performed at [facility], [address] [place], [place] [address]   Culture, blood (Routine x 2)     Status: None (Preliminary result)   Collection Time: 04/30/19 12:47 AM   Specimen: BLOOD  Result Value Ref Range Status   Specimen Description BLOOD RIGHT HAND  Final   Special Requests   Final    BOTTLES DRAWN AEROBIC  AND ANAEROBIC Blood Culture results may not be optimal due to an inadequate volume of blood received in culture bottles   Culture   Final    NO GROWTH 1 DAY Performed at [facility], [address] [place], [place] [address]    Report Status PENDING  Incomplete  Culture, blood (Routine x 2)     Status: None (Preliminary result)   Collection Time: 04/30/19  2:15 AM   Specimen: BLOOD  Result Value Ref Range Status   Specimen Description BLOOD LEFT HAND  Final   Special Requests   Final    BOTTLES DRAWN AEROBIC AND ANAEROBIC Blood Culture results may not be optimal due to an inadequate volume of blood received in culture bottles   Culture   Final    NO GROWTH 1 DAY Performed at [facility], [address] [place], [place] [address]    Report Status PENDING  Incomplete  Urine culture     Status: Abnormal   Collection Time: 04/30/19  4:00 AM   Specimen: In/Out Cath Urine  Result Value Ref Range Status   Specimen Description   Final    IN/OUT CATH URINE Performed at [facility], [address], [place], [place] [address]    Special Requests   Final    NONE Performed at [facility], [address] [place], [place] [address]    Culture (A)  Final    <10,000 COLONIES/mL INSIGNIFICANT GROWTH Performed at [facility], [place] [address] [place], [place] [address]    Report Status 05/01/2019 FINAL  Final      Studies: DG Tibia/Fibula Left  Result Date: 04/30/2019 CLINICAL DATA:  Suspected sepsis. Lower left leg pain, broad from [employer] parking lot. Released from jail today. Fall. EXAM: LEFT TIBIA AND FIBULA - 2 VIEW COMPARISON:  None. FINDINGS: Intramedullary rod with proximal distal locking screws traverses remote proximal tibial fracture. There is incomplete fracture healing with fracture lines remaining visible. No periprosthetic lucency. Remote proximal and distal fibular fractures have healed, solid bony bridging distally.  Incomplete bony bridging proximally. No evidence of acute fracture. No focal soft tissue abnormality. No periosteal reaction or bony destruction. Bones are under mineralized. IMPRESSION: 1. No evidence of acute fracture of the left tibia or fibula. 2. Intramedullary rod traverses remote proximal tibial fracture with incomplete fracture healing. Remote proximal and distal fibular fractures, incomplete healing proximally. Electronically Signed   By: Keith Rake M.D.   On: 04/30/2019 01:30   CT Head Wo Contrast  Result Date: 04/30/2019 CLINICAL DATA:  Homeless patient who fell.  Mental status changes. EXAM: CT HEAD WITHOUT CONTRAST TECHNIQUE: Contiguous axial images were  obtained from the base of the skull through the vertex without intravenous contrast. COMPARISON:  12/02/2014 FINDINGS: Brain: The brain shows a normal appearance without evidence of malformation, atrophy, old or acute small or large vessel infarction, mass lesion, hemorrhage, hydrocephalus or extra-axial collection. Vascular: No hyperdense vessel. No evidence of atherosclerotic calcification. Skull: Normal.  No traumatic finding.  No focal bone lesion. Sinuses/Orbits: Sinuses are clear. Orbits appear normal. Mastoids are clear. Other: None significant IMPRESSION: Normal head CT Electronically Signed   By: Nelson Chimes M.D.   On: 04/30/2019 05:14   CT Angio Chest PE W and/or Wo Contrast  Result Date: 04/30/2019 CLINICAL DATA:  39 year old male with history of trauma from a fall. Possible sepsis. EXAM: CT ANGIOGRAPHY CHEST CT ABDOMEN AND PELVIS WITH CONTRAST TECHNIQUE: Multidetector CT imaging of the chest was performed using the standard protocol during bolus administration of intravenous contrast. Multiplanar CT image reconstructions and MIPs were obtained to evaluate the vascular anatomy. Multidetector CT imaging of the abdomen and pelvis was performed using the standard protocol during bolus administration of intravenous contrast.  CONTRAST:  169m OMNIPAQUE IOHEXOL 350 MG/ML SOLN COMPARISON:  CT the chest, abdomen and pelvis 10/23/2013. FINDINGS: CTA CHEST FINDINGS Comment: Today's study is limited by considerable patient respiratory motion as well as suboptimal contrast bolus. Cardiovascular: Within the limitations of today's examination there are no definite central, lobar or segmental sized filling defects within the pulmonary arterial tree to suggest clinically relevant pulmonary embolism. Smaller subsegmental sized defects cannot be entirely excluded secondary to the limitations of today's examination. Heart size is normal. There is no significant pericardial fluid, thickening or pericardial calcification. No abnormal high attenuation fluid within the mediastinum to suggest posttraumatic mediastinal hematoma. No evidence of posttraumatic aortic dissection/transection. Mediastinum/Nodes: No pathologically enlarged mediastinal or hilar lymph nodes. Esophagus is unremarkable in appearance. No axillary lymphadenopathy. Lungs/Pleura: Linear area of architectural distortion in the left lower lobe which may reflect subsegmental atelectasis and/or chronic scarring. 1 cm ground-glass attenuation nodule in the anterior aspect of the right upper lobe (axial image 45 of series 5). No pleural effusions. Musculoskeletal: Old healed fracture of the right distal clavicle. Chronic compression fracture of T12 with posterior rod and screw fixation device extending from T11-L1. No acute displaced fractures or aggressive appearing lytic or blastic lesions are noted in the visualized portions of the skeleton. Review of the MIP images confirms the above findings. CT ABDOMEN and PELVIS FINDINGS Hepatobiliary: No evidence of significant acute traumatic injury to the liver. No suspicious cystic or solid hepatic lesions. No intra or extrahepatic biliary ductal dilatation. Gallbladder is unremarkable in appearance. Pancreas: No evidence of acute traumatic injury to  the pancreas. No pancreatic mass. No pancreatic ductal dilatation. No pancreatic or peripancreatic fluid collections or inflammatory changes. Spleen: No evidence of acute traumatic injury to the spleen. Unremarkable. Adrenals/Urinary Tract: No evidence of acute traumatic injury to either kidney or adrenal gland. Bilateral kidneys and adrenal glands are normal in appearance. No hydroureteronephrosis. Urinary bladder is intact and normal in appearance. Stomach/Bowel: No definite evidence of significant acute traumatic injury to the hollow viscera. Normal appearance of the stomach. No pathologic dilatation of small bowel or colon. Normal appendix. Vascular/Lymphatic: No evidence of acute traumatic injury to the abdominal aorta or major arteries/veins of the abdominal or pelvic vasculature. Atherosclerosis, without evidence of aneurysm or dissection in the abdominal or pelvic vasculature. No lymphadenopathy noted in the abdomen or pelvis. Reproductive: Prostate gland and seminal vesicles are unremarkable in appearance. Other: No high  attenuation fluid collection within the peritoneal cavity or retroperitoneum to suggest significant posttraumatic hemorrhage. No significant volume of ascites. No pneumoperitoneum. Musculoskeletal: Fixation screw traversing the left sacroiliac joint. Orthopedic fixation hardware in the left proximal femur incompletely imaged. Old healed fractures of the left superior and inferior pubic rami. There are no aggressive appearing lytic or blastic lesions noted in the visualized portions of the skeleton. Review of the MIP images confirms the above findings. IMPRESSION: 1. No definite pulmonary embolism within the limitations of today's examination. 2. No evidence of significant acute traumatic injury to the chest, abdomen or pelvis. 3. 1 cm ground-glass attenuation nodule in the right upper lobe. This is nonspecific. Initial follow-up with CT at 6-12 months is recommended to confirm persistence.  If persistent, repeat CT is recommended every 2 years until 5 years of stability has been established. This recommendation follows the consensus statement: Guidelines for Management of Incidental Pulmonary Nodules Detected on CT Images: From the Fleischner Society 2017; Radiology 2017; 284:228-243. 4. Atherosclerosis. 5. Additional incidental findings, as above. Electronically Signed   By: Vinnie Langton M.D.   On: 04/30/2019 05:30   CT ABDOMEN PELVIS W CONTRAST  Result Date: 04/30/2019 CLINICAL DATA:  38 year old male with history of trauma from a fall. Possible sepsis. EXAM: CT ANGIOGRAPHY CHEST CT ABDOMEN AND PELVIS WITH CONTRAST TECHNIQUE: Multidetector CT imaging of the chest was performed using the standard protocol during bolus administration of intravenous contrast. Multiplanar CT image reconstructions and MIPs were obtained to evaluate the vascular anatomy. Multidetector CT imaging of the abdomen and pelvis was performed using the standard protocol during bolus administration of intravenous contrast. CONTRAST:  172m OMNIPAQUE IOHEXOL 350 MG/ML SOLN COMPARISON:  CT the chest, abdomen and pelvis 10/23/2013. FINDINGS: CTA CHEST FINDINGS Comment: Today's study is limited by considerable patient respiratory motion as well as suboptimal contrast bolus. Cardiovascular: Within the limitations of today's examination there are no definite central, lobar or segmental sized filling defects within the pulmonary arterial tree to suggest clinically relevant pulmonary embolism. Smaller subsegmental sized defects cannot be entirely excluded secondary to the limitations of today's examination. Heart size is normal. There is no significant pericardial fluid, thickening or pericardial calcification. No abnormal high attenuation fluid within the mediastinum to suggest posttraumatic mediastinal hematoma. No evidence of posttraumatic aortic dissection/transection. Mediastinum/Nodes: No pathologically enlarged mediastinal or  hilar lymph nodes. Esophagus is unremarkable in appearance. No axillary lymphadenopathy. Lungs/Pleura: Linear area of architectural distortion in the left lower lobe which may reflect subsegmental atelectasis and/or chronic scarring. 1 cm ground-glass attenuation nodule in the anterior aspect of the right upper lobe (axial image 45 of series 5). No pleural effusions. Musculoskeletal: Old healed fracture of the right distal clavicle. Chronic compression fracture of T12 with posterior rod and screw fixation device extending from T11-L1. No acute displaced fractures or aggressive appearing lytic or blastic lesions are noted in the visualized portions of the skeleton. Review of the MIP images confirms the above findings. CT ABDOMEN and PELVIS FINDINGS Hepatobiliary: No evidence of significant acute traumatic injury to the liver. No suspicious cystic or solid hepatic lesions. No intra or extrahepatic biliary ductal dilatation. Gallbladder is unremarkable in appearance. Pancreas: No evidence of acute traumatic injury to the pancreas. No pancreatic mass. No pancreatic ductal dilatation. No pancreatic or peripancreatic fluid collections or inflammatory changes. Spleen: No evidence of acute traumatic injury to the spleen. Unremarkable. Adrenals/Urinary Tract: No evidence of acute traumatic injury to either kidney or adrenal gland. Bilateral kidneys and adrenal glands are normal  in appearance. No hydroureteronephrosis. Urinary bladder is intact and normal in appearance. Stomach/Bowel: No definite evidence of significant acute traumatic injury to the hollow viscera. Normal appearance of the stomach. No pathologic dilatation of small bowel or colon. Normal appendix. Vascular/Lymphatic: No evidence of acute traumatic injury to the abdominal aorta or major arteries/veins of the abdominal or pelvic vasculature. Atherosclerosis, without evidence of aneurysm or dissection in the abdominal or pelvic vasculature. No lymphadenopathy  noted in the abdomen or pelvis. Reproductive: Prostate gland and seminal vesicles are unremarkable in appearance. Other: No high attenuation fluid collection within the peritoneal cavity or retroperitoneum to suggest significant posttraumatic hemorrhage. No significant volume of ascites. No pneumoperitoneum. Musculoskeletal: Fixation screw traversing the left sacroiliac joint. Orthopedic fixation hardware in the left proximal femur incompletely imaged. Old healed fractures of the left superior and inferior pubic rami. There are no aggressive appearing lytic or blastic lesions noted in the visualized portions of the skeleton. Review of the MIP images confirms the above findings. IMPRESSION: 1. No definite pulmonary embolism within the limitations of today's examination. 2. No evidence of significant acute traumatic injury to the chest, abdomen or pelvis. 3. 1 cm ground-glass attenuation nodule in the right upper lobe. This is nonspecific. Initial follow-up with CT at 6-12 months is recommended to confirm persistence. If persistent, repeat CT is recommended every 2 years until 5 years of stability has been established. This recommendation follows the consensus statement: Guidelines for Management of Incidental Pulmonary Nodules Detected on CT Images: From the Fleischner Society 2017; Radiology 2017; 284:228-243. 4. Atherosclerosis. 5. Additional incidental findings, as above. Electronically Signed   By: Vinnie Langton M.D.   On: 04/30/2019 05:30   DG Chest Port 1 View  Result Date: 04/30/2019 CLINICAL DATA:  Suspected sepsis. Left lower leg pain. EXAM: PORTABLE CHEST 1 VIEW COMPARISON:  Chest radiograph 08/25/2018 FINDINGS: Chronic elevation of left hemidiaphragm with left basilar scarring.The cardiomediastinal contours are normal. Pulmonary vasculature is normal. No consolidation, pleural effusion, or pneumothorax. Remote right clavicle fracture. Surgical hardware in the lumbar spine partially included. No acute  osseous abnormalities are seen. IMPRESSION: No acute chest findings. Electronically Signed   By: Keith Rake M.D.   On: 04/30/2019 01:32   DG Knee Complete 4 Views Left  Result Date: 04/30/2019 CLINICAL DATA:  Fall and pain. EXAM: LEFT KNEE - COMPLETE 4+ VIEW COMPARISON:  None. FINDINGS: Cortical irregularity about the lateral femoral condyle may represent an acute fracture. Intramedullary rod with distal locking screw traversing the femoral shaft partially included. There is a small knee joint effusion. Mild peripheral spurring. IMPRESSION: Cortical irregularity about the lateral femoral condyle may represent an acute fracture. Recommend correlation with focal tenderness. Electronically Signed   By: Keith Rake M.D.   On: 04/30/2019 01:34   DG Foot 2 Views Left  Result Date: 05/01/2019 CLINICAL DATA:  Post fall now with persistent left foot pain. EXAM: LEFT FOOT - 2 VIEW COMPARISON:  None. FINDINGS: Osteopenia. The distal end of a tibial intramedullary rod is incompletely evaluated though without definitive evidence of hardware failure or loosening. Age-indeterminate though likely chronic avulsion fracture involving the distal tip of the medial malleolus. Deformity involving the distal end of the fibula, likely the sequela of remote fracture. No additional fractures identified. Joint spaces appear preserved given obliquity. No significant hallux valgus deformity. No plantar calcaneal spur. Regional soft tissues appear normal. IMPRESSION: 1. Age-indeterminate, though likely chronic, avulsion fracture involving the distal tip of the medial malleolus. Correlation for point tenderness at  this location is advised. 2. Otherwise, osteopenia without definitive acute fracture or dislocation. 3. Post intramedullary rod fixation of the distal tibia without evidence of hardware failure or loosening. 4. Deformity involving the distal end of the fibula, likely the sequela of remote fracture. Electronically Signed    By: Sandi Mariscal M.D.   On: 05/01/2019 10:32   DG Hip Unilat W or Wo Pelvis 2-3 Views Left  Result Date: 04/30/2019 CLINICAL DATA:  Fall and pain. EXAM: DG HIP (WITH OR WITHOUT PELVIS) 2-3V LEFT COMPARISON:  None. FINDINGS: Remote left pubic rami fractures. Remote left proximal femur fracture with intramedullary rod and trans trochanteric screw fixation. Screw traverses the left sacroiliac joint and sacrum. There is no evidence of acute fracture. Femoral heads are normally located. IMPRESSION: 1. No acute fracture of the pelvis or left hip. 2. Remote left pubic rami fractures. Surgical hardware traversing the left sacroiliac joint and left proximal femur without complication. Electronically Signed   By: Keith Rake M.D.   On: 04/30/2019 01:36     Scheduled Meds: . calcium carbonate  3 tablet Oral Daily  . divalproex  1,000 mg Oral Daily  . docusate sodium  100 mg Oral Daily  . enoxaparin (LOVENOX) injection  40 mg Subcutaneous Q24H  . folic acid  1 mg Oral Daily  . gabapentin  300 mg Oral Q8H  . LORazepam  0-4 mg Intravenous Q6H   Followed by  . [START ON 05/02/2019] LORazepam  0-4 mg Intravenous Q12H  . multivitamin with minerals  1 tablet Oral Daily  . thiamine  100 mg Oral Daily   Or  . thiamine  100 mg Intravenous Daily   Continuous Infusions: . sodium chloride      Principal Problem:   Sepsis ([place]) Active Problems:   Fall   Left leg pain   Orthopedic hardware present   S/P cystoscopy 04/20/19 [facility]. Hx urethroplasty for urethral trauma in 2020   Alcohol abuse with intoxication ([place])   Acute metabolic encephalopathy   Hypoxia   Acute Femoral condyle fracture ([place])   Severe sepsis ([place])     Alaster Asfaw Derek Jack,  [facility]  If 7PM-7AM, please contact night-coverage [email or web address] Password [email or web address] 05/01/2019, 4:23 PM    LOS: 1 day

## 2019-05-01 NOTE — Progress Notes (Signed)
PT Cancellation Note  Patient Details Name: John [name] MRN: [id number] DOB: 1980/09/19   Cancelled Treatment:    Reason Eval/Treat Not Completed: Patient not medically ready. Patient has ortho consult pending for possible lateral femoral condyle fracture. Will evaluate once cleared by ortho MD.   Lissa Merlin 05/01/2019, 12:50 PM

## 2019-05-02 MED ORDER — ADULT MULTIVITAMIN W/MINERALS CH: 1.0000 | ORAL_TABLET | Freq: Every day | ORAL | Status: DC

## 2019-05-02 MED ORDER — POTASSIUM CHLORIDE CRYS ER 20 MEQ PO TBCR
40.0000 meq | EXTENDED_RELEASE_TABLET | Freq: Once | ORAL | Status: AC
  Administered 2019-05-02: 40 meq via ORAL
  Filled 2019-05-02: qty 2

## 2019-05-02 NOTE — Discharge Instructions (Addendum)
Your chest CT has a nonspecific 1 cm nodule in the right upper lobe. YOU WILL NEED A REPEAT CT SCAN OF YOUR CHEST IN 6-12 MONTHS. PLEASE MAKE SURE YOU HAVE A PCP BY THEN SO THEY CAN ORDER A REPEAT CHEST CT.

## 2019-05-02 NOTE — Progress Notes (Signed)
Pt refused bed alarm, instructed on call before getting out of bed, verbalized understanding

## 2019-05-02 NOTE — Progress Notes (Signed)
John [name]  A and O x 4 VSS. Pt tolerating diet well. No complaints of pain or nausea. IV removed intact, prescriptions given. Pt voices understanding of discharge instructions with no further questions. Pt discharged via wheelchair with axillary.   Allergies as of 05/02/2019      Reactions   Penicillins       Medication List    TAKE these medications   acetaminophen 500 MG tablet Commonly known as: TYLENOL Take 500 mg by mouth every 8 (eight) hours as needed for mild pain.   calcium carbonate 500 MG chewable tablet Commonly known as: TUMS - dosed in mg elemental calcium Chew 3 tablets by mouth daily.   divalproex 500 MG 24 hr tablet Commonly known as: DEPAKOTE ER Take 1,000 mg by mouth daily.   docusate sodium 100 MG capsule Commonly known as: COLACE Take 100 mg by mouth daily.   gabapentin 300 MG capsule Commonly known as: NEURONTIN Take 300 mg by mouth every 8 (eight) hours.   Minerin Creme Crea Apply 1 application topically at bedtime.   multivitamin with minerals Tabs tablet Take 1 tablet by mouth daily. Start taking on: May 03, 2019   thiamine 100 MG tablet Take 100 mg by mouth daily.   Vitamin D3 1.25 MG (50000 UT) Caps Take 50,000 Units by mouth once a week.            Durable Medical Equipment  (From admission, onward)         Start     Ordered   05/02/19 1202  For home use only DME Walker rolling  Once    Question Answer Comment  Walker: With 5 Inch Wheels   Patient needs a walker to treat with the following condition Ankle sprain      05/02/19 1201          Vitals:   05/01/19 1921 05/02/19 0354  BP: 119/85 113/74  Pulse: (!) 103 94  Resp: 18 16  Temp: 99 F (37.2 C) 98.3 F (36.8 C)  SpO2: 96% 96%    John [name]

## 2019-05-02 NOTE — Discharge Summary (Signed)
John [name] ZOX:[id number] DOB: 1980/10/23 DOA: 04/30/2019  PCP: Patient, No Pcp Per  Admit date: 04/30/2019  Discharge date: 05/02/2019  Admitted From: Patient had just been let out of jail that morning, did not yet have a home   disposition: A friend's house   Recommendations for Outpatient Follow-up:   Follow up with orthopedics in 7 to 10 days You will need a follow-up chest CT in 6 to 12 months as previously discussed to follow-up on nonspecific right upper lobe nodule as recommended by radiology.  Sure to get a PCP by then.  Home Health: None Equipment/Devices: Rolling walker Consultations: Orthopedics Discharge Condition: Improved CODE STATUS: Full Diet Recommendation: Regular     Chief Complaint  Patient presents with  . Fall     Brief history of present illness from the day of admission and additional interim summary    John [name] is a 39 y.o. male with medical history significant for alcohol abuse, history of polytrauma with orthopedic hardware to left leg and left sacroiliac joint, history of urethral trauma status post urethroplasty in 2020 at [facility] with cystoscopy on 04/20/2019 with patent urethra and healthy anastomosis, who presents to the emergency room following a fall while at [employer] with complaint of left leg pain from the knee down.  Patient was released from jail earlier in the day and said he felt fine when he left.  He stated that he had 1 drink after he left jail but denies other substance abuse.  On arrival to the emergency room he was very somnolent and complaint mostly of pain in his left knee.  Denied chest pain shortness of breath or fever was noted to have shaking chills.  He denies loss of consciousness or dizziness preceding his fall.  On arrival to the emergency room he was  very lethargic and had to be grossly shaken to wake to answer questions and then he would fall back asleep after answering.  Temperature 100.7, BP 141/117, pulse 124, respiratory rate 24 with O2 sat initially 84% on room air improving to 89%.  White cell count 14,800 hemoglobin 16.3.Chem 7 mostly unremarkable.  EtOH 115, lactic acid 4.3 procalcitonin less than 0.10.  Flu and Covid test negative.  Urinalysis was sterile with negative leukocyte esterase negative nitrites WBC 0-5 bacteria none seen.  Urine drug screen negative.  EKG sinus rhythm.  Trauma work-up to include x-ray hip left knee and left tib-fib show stable hardware of the left sacroiliac  joint, proximal femur and left tibia.  He was noted to have a possible fracture of the left lateral femoral condyle.  Patient was started on IV fluid bolus and antibiotics for treatment of sepsis of unknown source.                                                                   Hospital Course   Patient improved markedly with IV fluid resuscitation.  Procalcitonin level came back at less than 0.10.  All antibiotics were discontinued.  Patient sobered up over the course of the night.  The following morning after admission patient was primarily complaining of left knee pain and left ankle pain.  X-rays revealed possible hairline fracture of left medial femoral condyle and an old avulsion fracture of left lateral malleolus.  Hypotension and tachycardia were treated with aggressive fluid resuscitation with resolution of both conditions.  On day of discharge patient was feeling well with plenty of energy and other than foot pain had no other complaints.  Patient was seen by orthopedics who recommended a knee immobilizer and cam walker boot for conservative therapy to follow-up with them in 7 to 10 days.   Hypotension/tachycardia Patient was thought to be septic when he presented with hypotension however procalcitonin was negative and no clear source for infection on  imaging.  Antibiotics were discontinued. Patient was aggressively fluid resuscitation with resolution of hypotension and tachycardia.  Left foot/ankle/knee pain X-rays revealed possible left lateral femoral condyle fracture and possible chronic avulsion of medial malleolus.  Discussed with orthopedics who recommend knee immobilizer and cam walker boot.  Patient was seen by physical therapy and prescribed a rolling walker.  Patient is to follow-up with orthopedics as an outpatient in 7 to 10 days  Metabolic encephalopathy Patient is without any confusion, patient was most likely just intoxicated upon admission  Hypoxic respiratory failure Patient is doing well off of any oxygen.  Alcoholic abuse with intoxication Resolved  Abnormal chest CT Chest CT report: 1 cm ground-glass attenuation nodule in the right upper lobe. This is nonspecific.  Initial follow-up with CT at 6-12 months is recommended to confirm persistence.  If persistent, repeat CT is recommended every 2 years until 5 years of stability has been established.    Discharge diagnosis     Principal Problem:   Sepsis [facility]) Active Problems:   Fall   Left leg pain   Orthopedic hardware present   S/P cystoscopy 04/20/19 [facility]. Hx urethroplasty for urethral trauma in 2020   Alcohol abuse with intoxication (HCC)   Acute metabolic encephalopathy   Hypoxia   Acute Femoral condyle fracture (HCC)   Severe sepsis [facility])    Discharge instructions    Discharge Instructions    Diet - low sodium heart healthy   Complete by: As directed    Discharge instructions   Complete by: As directed    Wear your splints as advised by Orthopedist Physician.  Keep your leg elevated as much as possible for the next few days.  Do not drink any more alcohol. Avoid re-injuring your left leg.  Make an appointment to see the Orthopedist in 7-10 days.   Increase activity slowly   Complete by: As directed       Discharge Medications  Allergies as of 05/02/2019      Reactions   Penicillins       Medication List    TAKE these medications   acetaminophen 500 MG tablet Commonly known as: TYLENOL Take 500 mg by mouth every 8 (eight) hours as needed for mild pain.   calcium carbonate 500 MG chewable tablet Commonly known as: TUMS - dosed in mg elemental calcium Chew 3 tablets by mouth daily.   divalproex 500 MG 24 hr tablet Commonly known as: DEPAKOTE ER Take 1,000 mg by mouth daily.   docusate sodium 100 MG capsule Commonly known as: COLACE Take 100 mg by mouth daily.   gabapentin 300 MG capsule Commonly known as: NEURONTIN Take 300 mg by mouth every 8 (eight) hours.   Minerin Creme Crea Apply 1 application topically at bedtime.   multivitamin with minerals Tabs tablet Take 1 tablet by mouth daily. Start taking on: May 03, 2019   thiamine 100 MG tablet Take 100 mg by mouth daily.   Vitamin D3 1.25 MG (50000 UT) Caps Take 50,000 Units by mouth once a week.            Durable Medical Equipment  (From admission, onward)         Start     Ordered   05/02/19 1202  For home use only DME Walker rolling  Once    Question Answer Comment  Walker: With 5 Inch Wheels   Patient needs a walker to treat with the following condition Ankle sprain      05/02/19 1201          Follow-up Information    Deeann Saint, MD Follow up in 5 day(s).   Specialty: Orthopedic Surgery Contact information: [address] [place] [address] [phone]           Major procedures and Radiology Reports - PLEASE review detailed and final reports thoroughly  -       DG Tibia/Fibula Left  Result Date: 04/30/2019 CLINICAL DATA:  Suspected sepsis. Lower left leg pain, broad from [employer] parking lot. Released from jail today. Fall. EXAM: LEFT TIBIA AND FIBULA - 2 VIEW COMPARISON:  None. FINDINGS: Intramedullary rod with proximal distal locking screws traverses remote proximal tibial fracture.  There is incomplete fracture healing with fracture lines remaining visible. No periprosthetic lucency. Remote proximal and distal fibular fractures have healed, solid bony bridging distally. Incomplete bony bridging proximally. No evidence of acute fracture. No focal soft tissue abnormality. No periosteal reaction or bony destruction. Bones are under mineralized. IMPRESSION: 1. No evidence of acute fracture of the left tibia or fibula. 2. Intramedullary rod traverses remote proximal tibial fracture with incomplete fracture healing. Remote proximal and distal fibular fractures, incomplete healing proximally. Electronically Signed   By: Narda Rutherford M.D.   On: 04/30/2019 01:30   CT Head Wo Contrast  Result Date: 04/30/2019 CLINICAL DATA:  Homeless patient who fell.  Mental status changes. EXAM: CT HEAD WITHOUT CONTRAST TECHNIQUE: Contiguous axial images were obtained from the base of the skull through the vertex without intravenous contrast. COMPARISON:  12/02/2014 FINDINGS: Brain: The brain shows a normal appearance without evidence of malformation, atrophy, old or acute small or large vessel infarction, mass lesion, hemorrhage, hydrocephalus or extra-axial collection. Vascular: No hyperdense vessel. No evidence of atherosclerotic calcification. Skull: Normal.  No traumatic finding.  No focal bone lesion. Sinuses/Orbits: Sinuses are clear. Orbits appear normal. Mastoids are clear. Other: None significant IMPRESSION: Normal head CT Electronically Signed  By: Paulina Fusi M.D.   On: 04/30/2019 05:14   CT Angio Chest PE W and/or Wo Contrast  Result Date: 04/30/2019 CLINICAL DATA:  39 year old male with history of trauma from a fall. Possible sepsis. EXAM: CT ANGIOGRAPHY CHEST CT ABDOMEN AND PELVIS WITH CONTRAST TECHNIQUE: Multidetector CT imaging of the chest was performed using the standard protocol during bolus administration of intravenous contrast. Multiplanar CT image reconstructions and MIPs were  obtained to evaluate the vascular anatomy. Multidetector CT imaging of the abdomen and pelvis was performed using the standard protocol during bolus administration of intravenous contrast. CONTRAST:  OMNIPAQUE IOHEXOL 350 MG/ML SOLN COMPARISON:  CT the chest, abdomen and pelvis 10/23/2013. FINDINGS: CTA CHEST FINDINGS Comment: Today's study is limited by considerable patient respiratory motion as well as suboptimal contrast bolus. Cardiovascular: Within the limitations of today's examination there are no definite central, lobar or segmental sized filling defects within the pulmonary arterial tree to suggest clinically relevant pulmonary embolism. Smaller subsegmental sized defects cannot be entirely excluded secondary to the limitations of today's examination. Heart size is normal. There is no significant pericardial fluid, thickening or pericardial calcification. No abnormal high attenuation fluid within the mediastinum to suggest posttraumatic mediastinal hematoma. No evidence of posttraumatic aortic dissection/transection. Mediastinum/Nodes: No pathologically enlarged mediastinal or hilar lymph nodes. Esophagus is unremarkable in appearance. No axillary lymphadenopathy. Lungs/Pleura: Linear area of architectural distortion in the left lower lobe which may reflect subsegmental atelectasis and/or chronic scarring. 1 cm ground-glass attenuation nodule in the anterior aspect of the right upper lobe (axial image 45 of series 5). No pleural effusions. Musculoskeletal: Old healed fracture of the right distal clavicle. Chronic compression fracture of T12 with posterior rod and screw fixation device extending from T11-L1. No acute displaced fractures or aggressive appearing lytic or blastic lesions are noted in the visualized portions of the skeleton. Review of the MIP images confirms the above findings. CT ABDOMEN and PELVIS FINDINGS Hepatobiliary: No evidence of significant acute traumatic injury to the liver. No  suspicious cystic or solid hepatic lesions. No intra or extrahepatic biliary ductal dilatation. Gallbladder is unremarkable in appearance. Pancreas: No evidence of acute traumatic injury to the pancreas. No pancreatic mass. No pancreatic ductal dilatation. No pancreatic or peripancreatic fluid collections or inflammatory changes. Spleen: No evidence of acute traumatic injury to the spleen. Unremarkable. Adrenals/Urinary Tract: No evidence of acute traumatic injury to either kidney or adrenal gland. Bilateral kidneys and adrenal glands are normal in appearance. No hydroureteronephrosis. Urinary bladder is intact and normal in appearance. Stomach/Bowel: No definite evidence of significant acute traumatic injury to the hollow viscera. Normal appearance of the stomach. No pathologic dilatation of small bowel or colon. Normal appendix. Vascular/Lymphatic: No evidence of acute traumatic injury to the abdominal aorta or major arteries/veins of the abdominal or pelvic vasculature. Atherosclerosis, without evidence of aneurysm or dissection in the abdominal or pelvic vasculature. No lymphadenopathy noted in the abdomen or pelvis. Reproductive: Prostate gland and seminal vesicles are unremarkable in appearance. Other: No high attenuation fluid collection within the peritoneal cavity or retroperitoneum to suggest significant posttraumatic hemorrhage. No significant volume of ascites. No pneumoperitoneum. Musculoskeletal: Fixation screw traversing the left sacroiliac joint. Orthopedic fixation hardware in the left proximal femur incompletely imaged. Old healed fractures of the left superior and inferior pubic rami. There are no aggressive appearing lytic or blastic lesions noted in the visualized portions of the skeleton. Review of the MIP images confirms the above findings. IMPRESSION: 1. No definite pulmonary embolism within the limitations  of today's examination. 2. No evidence of significant acute traumatic injury to the  chest, abdomen or pelvis. 3. 1 cm ground-glass attenuation nodule in the right upper lobe. This is nonspecific. Initial follow-up with CT at 6-12 months is recommended to confirm persistence. If persistent, repeat CT is recommended every 2 years until 5 years of stability has been established. This recommendation follows the consensus statement: Guidelines for Management of Incidental Pulmonary Nodules Detected on CT Images: From the Fleischner Society 2017; Radiology 2017; 284:228-243. 4. Atherosclerosis. 5. Additional incidental findings, as above. Electronically Signed   By: Trudie Reedaniel  Entrikin M.D.   On: 04/30/2019 05:30   CT ABDOMEN PELVIS W CONTRAST  Result Date: 04/30/2019 CLINICAL DATA:  39 year old male with history of trauma from a fall. Possible sepsis. EXAM: CT ANGIOGRAPHY CHEST CT ABDOMEN AND PELVIS WITH CONTRAST TECHNIQUE: Multidetector CT imaging of the chest was performed using the standard protocol during bolus administration of intravenous contrast. Multiplanar CT image reconstructions and MIPs were obtained to evaluate the vascular anatomy. Multidetector CT imaging of the abdomen and pelvis was performed using the standard protocol during bolus administration of intravenous contrast. CONTRAST:  100mL OMNIPAQUE IOHEXOL 350 MG/ML SOLN COMPARISON:  CT the chest, abdomen and pelvis 10/23/2013. FINDINGS: CTA CHEST FINDINGS Comment: Today's study is limited by considerable patient respiratory motion as well as suboptimal contrast bolus. Cardiovascular: Within the limitations of today's examination there are no definite central, lobar or segmental sized filling defects within the pulmonary arterial tree to suggest clinically relevant pulmonary embolism. Smaller subsegmental sized defects cannot be entirely excluded secondary to the limitations of today's examination. Heart size is normal. There is no significant pericardial fluid, thickening or pericardial calcification. No abnormal high attenuation  fluid within the mediastinum to suggest posttraumatic mediastinal hematoma. No evidence of posttraumatic aortic dissection/transection. Mediastinum/Nodes: No pathologically enlarged mediastinal or hilar lymph nodes. Esophagus is unremarkable in appearance. No axillary lymphadenopathy. Lungs/Pleura: Linear area of architectural distortion in the left lower lobe which may reflect subsegmental atelectasis and/or chronic scarring. 1 cm ground-glass attenuation nodule in the anterior aspect of the right upper lobe (axial image 45 of series 5). No pleural effusions. Musculoskeletal: Old healed fracture of the right distal clavicle. Chronic compression fracture of T12 with posterior rod and screw fixation device extending from T11-L1. No acute displaced fractures or aggressive appearing lytic or blastic lesions are noted in the visualized portions of the skeleton. Review of the MIP images confirms the above findings. CT ABDOMEN and PELVIS FINDINGS Hepatobiliary: No evidence of significant acute traumatic injury to the liver. No suspicious cystic or solid hepatic lesions. No intra or extrahepatic biliary ductal dilatation. Gallbladder is unremarkable in appearance. Pancreas: No evidence of acute traumatic injury to the pancreas. No pancreatic mass. No pancreatic ductal dilatation. No pancreatic or peripancreatic fluid collections or inflammatory changes. Spleen: No evidence of acute traumatic injury to the spleen. Unremarkable. Adrenals/Urinary Tract: No evidence of acute traumatic injury to either kidney or adrenal gland. Bilateral kidneys and adrenal glands are normal in appearance. No hydroureteronephrosis. Urinary bladder is intact and normal in appearance. Stomach/Bowel: No definite evidence of significant acute traumatic injury to the hollow viscera. Normal appearance of the stomach. No pathologic dilatation of small bowel or colon. Normal appendix. Vascular/Lymphatic: No evidence of acute traumatic injury to the  abdominal aorta or major arteries/veins of the abdominal or pelvic vasculature. Atherosclerosis, without evidence of aneurysm or dissection in the abdominal or pelvic vasculature. No lymphadenopathy noted in the abdomen or pelvis. Reproductive: Prostate gland  and seminal vesicles are unremarkable in appearance. Other: No high attenuation fluid collection within the peritoneal cavity or retroperitoneum to suggest significant posttraumatic hemorrhage. No significant volume of ascites. No pneumoperitoneum. Musculoskeletal: Fixation screw traversing the left sacroiliac joint. Orthopedic fixation hardware in the left proximal femur incompletely imaged. Old healed fractures of the left superior and inferior pubic rami. There are no aggressive appearing lytic or blastic lesions noted in the visualized portions of the skeleton. Review of the MIP images confirms the above findings. IMPRESSION: 1. No definite pulmonary embolism within the limitations of today's examination. 2. No evidence of significant acute traumatic injury to the chest, abdomen or pelvis. 3. 1 cm ground-glass attenuation nodule in the right upper lobe. This is nonspecific. Initial follow-up with CT at 6-12 months is recommended to confirm persistence. If persistent, repeat CT is recommended every 2 years until 5 years of stability has been established. This recommendation follows the consensus statement: Guidelines for Management of Incidental Pulmonary Nodules Detected on CT Images: From the Fleischner Society 2017; Radiology 2017; 284:228-243. 4. Atherosclerosis. 5. Additional incidental findings, as above. Electronically Signed   By: Trudie Reed M.D.   On: 04/30/2019 05:30   DG Chest Port 1 View  Result Date: 04/30/2019 CLINICAL DATA:  Suspected sepsis. Left lower leg pain. EXAM: PORTABLE CHEST 1 VIEW COMPARISON:  Chest radiograph 08/25/2018 FINDINGS: Chronic elevation of left hemidiaphragm with left basilar scarring.The cardiomediastinal  contours are normal. Pulmonary vasculature is normal. No consolidation, pleural effusion, or pneumothorax. Remote right clavicle fracture. Surgical hardware in the lumbar spine partially included. No acute osseous abnormalities are seen. IMPRESSION: No acute chest findings. Electronically Signed   By: Narda Rutherford M.D.   On: 04/30/2019 01:32   DG Knee Complete 4 Views Left  Result Date: 04/30/2019 CLINICAL DATA:  Fall and pain. EXAM: LEFT KNEE - COMPLETE 4+ VIEW COMPARISON:  None. FINDINGS: Cortical irregularity about the lateral femoral condyle may represent an acute fracture. Intramedullary rod with distal locking screw traversing the femoral shaft partially included. There is a small knee joint effusion. Mild peripheral spurring. IMPRESSION: Cortical irregularity about the lateral femoral condyle may represent an acute fracture. Recommend correlation with focal tenderness. Electronically Signed   By: Narda Rutherford M.D.   On: 04/30/2019 01:34   DG Foot 2 Views Left  Result Date: 05/01/2019 CLINICAL DATA:  Post fall now with persistent left foot pain. EXAM: LEFT FOOT - 2 VIEW COMPARISON:  None. FINDINGS: Osteopenia. The distal end of a tibial intramedullary rod is incompletely evaluated though without definitive evidence of hardware failure or loosening. Age-indeterminate though likely chronic avulsion fracture involving the distal tip of the medial malleolus. Deformity involving the distal end of the fibula, likely the sequela of remote fracture. No additional fractures identified. Joint spaces appear preserved given obliquity. No significant hallux valgus deformity. No plantar calcaneal spur. Regional soft tissues appear normal. IMPRESSION: 1. Age-indeterminate, though likely chronic, avulsion fracture involving the distal tip of the medial malleolus. Correlation for point tenderness at this location is advised. 2. Otherwise, osteopenia without definitive acute fracture or dislocation. 3. Post  intramedullary rod fixation of the distal tibia without evidence of hardware failure or loosening. 4. Deformity involving the distal end of the fibula, likely the sequela of remote fracture. Electronically Signed   By: Simonne Come M.D.   On: 05/01/2019 10:32   DG Hip Unilat W or Wo Pelvis 2-3 Views Left  Result Date: 04/30/2019 CLINICAL DATA:  Fall and pain. EXAM: DG HIP (WITH  OR WITHOUT PELVIS) 2-3V LEFT COMPARISON:  None. FINDINGS: Remote left pubic rami fractures. Remote left proximal femur fracture with intramedullary rod and trans trochanteric screw fixation. Screw traverses the left sacroiliac joint and sacrum. There is no evidence of acute fracture. Femoral heads are normally located. IMPRESSION: 1. No acute fracture of the pelvis or left hip. 2. Remote left pubic rami fractures. Surgical hardware traversing the left sacroiliac joint and left proximal femur without complication. Electronically Signed   By: Narda Rutherford M.D.   On: 04/30/2019 01:36    Micro Results   Recent Results (from the past 240 hour(s))  Respiratory Panel by RT PCR (Flu A&B, Covid) - Nasopharyngeal Swab     Status: None   Collection Time: 04/30/19 12:46 AM   Specimen: Nasopharyngeal Swab  Result Value Ref Range Status   SARS Coronavirus 2 by RT PCR NEGATIVE NEGATIVE Final    Comment: (NOTE) SARS-CoV-2 target nucleic acids are NOT DETECTED. The SARS-CoV-2 RNA is generally detectable in upper respiratoy specimens during the acute phase of infection. The lowest concentration of SARS-CoV-2 viral copies this assay can detect is 131 copies/mL. A negative result does not preclude SARS-Cov-2 infection and should not be used as the sole basis for treatment or other patient management decisions. A negative result may occur with  improper specimen collection/handling, submission of specimen other than nasopharyngeal swab, presence of viral mutation(s) within the areas targeted by this assay, and inadequate number of viral  copies (<131 copies/mL). A negative result must be combined with clinical observations, patient history, and epidemiological information. The expected result is Negative. Fact Sheet for Patients:  https://www.moore.com/ Fact Sheet for Healthcare Providers:  https://www.young.biz/ This test is not yet ap proved or cleared by the [place] FDA and  has been authorized for detection and/or diagnosis of SARS-CoV-2 by FDA under an Emergency Use Authorization (EUA). This EUA will remain  in effect (meaning this test can be used) for the duration of the COVID-19 declaration under Section 564(b)(1) of the Act, 21 U.S.C. section 360bbb-3(b)(1), unless the authorization is terminated or revoked sooner.    Influenza A by PCR NEGATIVE NEGATIVE Final   Influenza B by PCR NEGATIVE NEGATIVE Final    Comment: (NOTE) The Xpert Xpress SARS-CoV-2/FLU/RSV assay is intended as an aid in  the diagnosis of influenza from Nasopharyngeal swab specimens and  should not be used as a sole basis for treatment. Nasal washings and  aspirates are unacceptable for Xpert Xpress SARS-CoV-2/FLU/RSV  testing. Fact Sheet for Patients: https://www.moore.com/ Fact Sheet for Healthcare Providers: https://www.young.biz/ This test is not yet approved or cleared by the [place] FDA and  has been authorized for detection and/or diagnosis of SARS-CoV-2 by  FDA under an Emergency Use Authorization (EUA). This EUA will remain  in effect (meaning this test can be used) for the duration of the  Covid-19 declaration under Section 564(b)(1) of the Act, 21  U.S.C. section 360bbb-3(b)(1), unless the authorization is  terminated or revoked. Performed at [facility], [address] [place], [place] [address]   Culture, blood (Routine x 2)     Status: None (Preliminary result)   Collection Time: 04/30/19 12:47 AM   Specimen: BLOOD    Result Value Ref Range Status   Specimen Description BLOOD RIGHT HAND  Final   Special Requests   Final    BOTTLES DRAWN AEROBIC AND ANAEROBIC Blood Culture results may not be optimal due to an inadequate volume of blood received in culture bottles  Culture   Final    NO GROWTH 2 DAYS Performed at [facility], [address] [place], [place] [address]    Report Status PENDING  Incomplete  Culture, blood (Routine x 2)     Status: None (Preliminary result)   Collection Time: 04/30/19  2:15 AM   Specimen: BLOOD  Result Value Ref Range Status   Specimen Description BLOOD LEFT HAND  Final   Special Requests   Final    BOTTLES DRAWN AEROBIC AND ANAEROBIC Blood Culture results may not be optimal due to an inadequate volume of blood received in culture bottles   Culture   Final    NO GROWTH 2 DAYS Performed at [facility], [address], [place], [place] [address]    Report Status PENDING  Incomplete  Urine culture     Status: Abnormal   Collection Time: 04/30/19  4:00 AM   Specimen: In/Out Cath Urine  Result Value Ref Range Status   Specimen Description   Final    IN/OUT CATH URINE Performed at [facility], [address] [place], [place] [address]    Special Requests   Final    NONE Performed at [facility], [address], [place], [place] [address]    Culture (A)  Final    <10,000 COLONIES/mL INSIGNIFICANT GROWTH Performed at [facility], [place] [address], [place], [place] [address]    Report Status 05/01/2019 FINAL  Final    Today   Subjective    John [name] today is awake and alert.  Other than his ongoing left leg pain, patient has no other physical complaints.  He is somewhat anxious about where he will go after discharge, he is going to try to stay with a friend.  Objective   Blood pressure 113/74, pulse 94, temperature 98.3 F (36.8 C), temperature source Oral, resp. rate 16, height (S) 5\' 9"  (1.753 m), weight  (S) 77.1 kg, SpO2 96 %.   Intake/Output Summary (Last 24 hours) at 05/02/2019 1448 Last data filed at 05/02/2019 1356 Gross per 24 hour  Intake 720 ml  Output 900 ml  Net -180 ml    Exam Awake Alert, Oriented x 3, No new F.N deficits, Normal affect .AT, sclera anicteric, conjunctiva mildly injected bilaterally CTAB RRR +ve B.Sounds, Abd Soft, Non tender, Patient does have trace to 1+ edema in his left lower ankle.   Data Review   CBC w Diff:  Lab Results  Component Value Date   WBC 6.7 05/01/2019   HGB 11.4 (L) 05/01/2019   HGB 14.0 12/19/2013   HCT 34.7 (L) 05/01/2019   HCT 43.6 12/19/2013   PLT 121 (L) 05/01/2019   PLT 265 12/19/2013   LYMPHOPCT 11 04/30/2019   LYMPHOPCT 4.8 09/27/2013   MONOPCT 10 04/30/2019   MONOPCT 1.6 09/27/2013   EOSPCT 0 04/30/2019   EOSPCT 0.1 09/27/2013   BASOPCT 0 04/30/2019   BASOPCT 0.2 09/27/2013    CMP:  Lab Results  Component Value Date   NA 140 05/01/2019   NA 141 12/19/2013   K 3.4 (L) 05/01/2019   K 3.5 12/19/2013   CL 103 05/01/2019   CL 109 (H) 12/19/2013   CO2 27 05/01/2019   CO2 24 12/19/2013   BUN 13 05/01/2019   BUN 7 12/19/2013   CREATININE 0.83 05/01/2019   CREATININE 0.97 12/19/2013   PROT 5.8 (L) 05/01/2019   PROT 9.1 (H) 11/27/2013   ALBUMIN 2.9 (L) 05/01/2019  ALBUMIN 4.2 11/27/2013   BILITOT 0.5 05/01/2019   BILITOT 0.4 11/27/2013   ALKPHOS 61 05/01/2019   ALKPHOS 112 11/27/2013   AST 24 05/01/2019   AST 29 11/27/2013   ALT 17 05/01/2019   ALT 22 11/27/2013  .   Total Time in preparing paper work, data evaluation and todays exam - 35 minutes  Pieter Partridge M.D on 05/02/2019 at 2:48 PM  [facility]   Office  [phone]

## 2019-05-02 NOTE — Evaluation (Signed)
Physical Therapy Evaluation Patient Details Name: John [name] MRN: [id number] DOB: 08-Oct-1980 Today's Date: 05/02/2019   History of Present Illness  Per MD notes: Pt is a 39 y.o. male who complains of left knee and ankle pain.  The patient was admitted 2 days ago for possible sepsis.  He also had just been not out of jail and became acutely intoxicated with a blood alcohol level of 0.11.  Apparently fell several times.  Complained of pain in the left knee and subsequently left ankle.  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  MD assessment includes: Hypotension/tachycardia, left lateral femoral condyle fracture and possible chronic avulsion of medial malleolus, and hypoxic respiratory failure.   Clinical Impression  Pt pleasant and motivated to participate during the session.  Pt was minimally impulsive during the session but followed commands well and demonstrated fair carryover with proper sequencing with transfers and gait.  Pt remained compliant with WB status throughout the session often ambulating with hop-to gait pattern and having to be cued to put light weight through the LLE when advancing the RLE with a step-to pattern.  Pt was steady with good WB compliance with ascending and descending stairs during stair training but was somewhat anxious using the walker to amb backwards up stairs.  Pt will benefit from HHPT services upon discharge to safely address deficits listed in patient problem list for decreased caregiver assistance and eventual return to PLOF.     Follow Up Recommendations Home health PT;Supervision for mobility/OOB    Equipment Recommendations  Rolling walker with 5" wheels;3in1 (PT)    Recommendations for Other Services       Precautions / Restrictions Precautions Precautions: Fall Required Braces or Orthoses: Knee Immobilizer -  Left;Other Brace Other Brace: Cam boot to L ankle Restrictions Weight Bearing Restrictions: Yes LLE Weight Bearing: Partial weight bearing LLE Partial Weight Bearing Percentage or Pounds: 20%      Mobility  Bed Mobility Overal bed mobility: Independent                Transfers Overall transfer level: Needs assistance Equipment used: Rolling walker (2 wheeled) Transfers: Sit to/from Stand Sit to Stand: Supervision         General transfer comment: Mod verbal cues for sequencing with fair carryover  Ambulation/Gait Ambulation/Gait assistance: Supervision Gait Distance (Feet): 80 Feet x 1, 40 Feet x 2 Assistive device: Rolling walker (2 wheeled) Gait Pattern/deviations: Step-to pattern Gait velocity: decreased   General Gait Details: Mod verbal and visual cues for WB with pt compliant with WB status throughout  Stairs Stairs: Yes Stairs assistance: Min guard;+2 safety/equipment Stair Management: With walker;Forwards;Backwards Number of Stairs: 3 General stair comments: Visual and verbal cues for proper sequencing ascending backwards and descending forwards with a RW  Wheelchair Mobility    Modified Rankin (Stroke Patients Only)       Balance Overall balance assessment: No apparent balance deficits (not formally assessed)                                           Pertinent Vitals/Pain Pain Assessment: 0-10 Pain Score: 2  Pain Location: LLE Pain Descriptors / Indicators: Sore Pain Intervention(s): Premedicated before session;Monitored during session    Home Living Family/patient expects to  be discharged to:: Unsure                 Additional Comments: Pt homeless with SW working to address living situation; no family or friends to live with per patient    Prior Function Level of Independence: Independent with assistive device(s)         Comments: Pt Mod Ind with a SPC for community ambulation, no other recent falls other  than current fall, Ind with ADLs; owns [facility] and wheelchair     Hand Dominance        Extremity/Trunk Assessment   Upper Extremity Assessment Upper Extremity Assessment: Overall WFL for tasks assessed    Lower Extremity Assessment Lower Extremity Assessment: LLE deficits/detail LLE: Unable to fully assess due to immobilization;Unable to fully assess due to pain       Communication   Communication: No difficulties  Cognition Arousal/Alertness: Awake/alert Behavior During Therapy: WFL for tasks assessed/performed Overall Cognitive Status: Within Functional Limits for tasks assessed                                        General Comments      Exercises Other Exercises Other Exercises: Sit to/from stand transfer training from various height surfaces   Assessment/Plan    PT Assessment Patient needs continued PT services  PT Problem List Decreased strength;Decreased activity tolerance;Decreased knowledge of use of DME;Decreased knowledge of precautions;Decreased safety awareness       PT Treatment Interventions DME instruction;Gait training;Stair training;Functional mobility training;Therapeutic activities;Therapeutic exercise;Balance training;Patient/family education    PT Goals (Current goals can be found in the Care Plan section)  Acute Rehab PT Goals Patient Stated Goal: To get back to walking without a walker PT Goal Formulation: With patient Time For Goal Achievement: 05/15/19 Potential to Achieve Goals: Good    Frequency 7X/week   Barriers to discharge        Co-evaluation               AM-PAC PT "6 Clicks" Mobility  Outcome Measure Help needed turning from your back to your side while in a flat bed without using bedrails?: None Help needed moving from lying on your back to sitting on the side of a flat bed without using bedrails?: None Help needed moving to and from a bed to a chair (including a wheelchair)?: A Little Help needed  standing up from a chair using your arms (e.g., wheelchair or bedside chair)?: A Little Help needed to walk in hospital room?: A Little Help needed climbing 3-5 steps with a railing? : A Little 6 Click Score: 20    End of Session Equipment Utilized During Treatment: Gait belt Activity Tolerance: Patient tolerated treatment well Patient left: in bed;with call bell/phone within reach;Other (comment)(Pt refused bed alarm per nursing) Nurse Communication: Mobility status PT Visit Diagnosis: Muscle weakness (generalized) (M62.81);Difficulty in walking, not elsewhere classified (R26.2);Pain Pain - Right/Left: Left Pain - part of body: Leg    Time: 1100-1149 PT Time Calculation (min) (ACUTE ONLY): 49 min   Charges:   PT Evaluation $PT Eval Moderate Complexity: 1 Mod PT Treatments $Gait Training: 8-22 mins $Therapeutic Activity: 8-22 mins        D. Scott Shaterra [place] PT, DPT 05/02/19, 12:30 PM

## 2019-05-02 NOTE — Progress Notes (Signed)
Per MD patient will need a follow up chest CT follow up in 6-12 months. Patient was already discharged at the time the changes were made. Patient is currently transient and has no phone to contact.

## 2019-05-02 NOTE — TOC Initial Note (Addendum)
Transition of Care [facility]) - Initial/Assessment Note    Patient Details  Name: John [name] MRN: [id number] Date of Birth: May 30, 1980  Transition [facility] CM/SW Contact:    [place] Fitch, RN Phone Number: 05/02/2019, 1:37 PM  Clinical Narrative:                 Patient   Expected Discharge Plan: Home/Self Care Barriers to Discharge: No Barriers Identified   Patient Goals and CMS Choice  Patient to discharge today Patient states that [facility] and they are unable to take him at this time.  Patient states that he due to his leg he is unable to work and that is one of the requirements for going there  Patient states that he has left 2 messages for [employer] and has not received a return call. RNCM spoke with [employer].  Spoke with Ms [place].  She states that patient would have to come to the homeless shelter to be "screened" prior to admission, but just because a patient is screened does not mean that a patient will be accepted.   RNCM offered patient a cab voucher to one of the homeless shelters in [place].  Patient declined.    Patient states that he has a friend "Amada Kingfisher [phone]" who is a Programmer, multimedia and he is hoping that he will take him in.  RNCM has left message for Mr. Christell Constant.  Patient states that he has a friend that works at [employer] who he hopes can give him a ride.  Patient declines cab voucher at this time  PT has assessed patient.  Patient provided charity RW from [facility].  PT has recommended home health.  Patient is aware that he will not receive services as he is not able to provide an address of which he is going  RNCM provided patient with "[facility]:  Your Guide to [employer] [facility] [employer] in [facility]"  Booklet,. Application to Medication Management  And [facility] . Patient has homeless shelter resources at his bedside. Patient provide list of outpatient/residential substance abuse  resources.   No new meds at discharge only multivitamins.   Update:  Patient states that his friend Tresa Endo will be able to pick him up at 4pm today.  RNCM notified TOC leadership, and department leadership. Per Department leadership patient will have to be off the hospital premises by 5pm.  RNCM and bedside nurse to patient's room to update.  He is in agreement to plan         Expected Discharge Plan and Services Expected Discharge Plan: Home/Self Care       Living arrangements for the past 2 months: Homeless Shelter, Correctional Facility Expected Discharge Date: 05/02/19               DME Arranged: Dan Humphreys rolling DME Agency: [facility] Date DME Agency Contacted: 05/02/19   Representative spoke with at DME Agency: Nida Boatman            Prior Living Arrangements/Services Living arrangements for the past 2 months: Homeless Shelter, [employer] Lives with:: Self                   Activities of Daily Living Home Assistive Devices/Equipment: Gilmer Mor (specify quad or straight) ADL Screening (condition at time of admission) Patient's cognitive ability adequate to safely complete daily activities?: Yes Is the patient deaf or have difficulty hearing?: No Does the patient have difficulty  seeing, even when wearing glasses/contacts?: No Does the patient have difficulty concentrating, remembering, or making decisions?: Yes Patient able to express need for assistance with ADLs?: Yes Does the patient have difficulty dressing or bathing?: No Independently performs ADLs?: No Communication: Independent Dressing (OT): Independent Grooming: Independent Feeding: Independent Bathing: Independent Toileting: Independent In/Out Bed: Independent Walks in Home: Needs assistance Is this a change from baseline?: Change from baseline, expected to last >3 days(Lower Left extremity injury) Does the patient have difficulty walking or climbing stairs?: Yes(injury to LLE) Weakness of Legs:  Left Weakness of Arms/Hands: None  Permission Sought/Granted                  Emotional Assessment Appearance:: Appears stated age     Orientation: : Oriented to Self, Oriented to Place, Oriented to  Time, Oriented to Situation Alcohol / Substance Use: Tobacco Use Psych Involvement: No (comment)  Admission diagnosis:  Acute respiratory failure with hypoxia ([place]) [J96.01] Fall, initial encounter [W19.XXXA] Sepsis ([place]) [A41.9] Severe sepsis ([place]) [A41.9, R65.20] Avulsion fracture of femoral condyle, left, closed, initial encounter ([place]) [S72.492A] Sepsis with encephalopathy without septic shock, due to unspecified organism ([place]) [A41.9, R65.20, G93.40] Patient Active Problem List   Diagnosis Date Noted  . Sepsis ([place]) 04/30/2019  . Fall 04/30/2019  . Left leg pain 04/30/2019  . Orthopedic hardware present 04/30/2019  . S/P cystoscopy 04/20/19 [facility]. Hx urethroplasty for urethral trauma in 2020 04/30/2019  . Alcohol abuse with intoxication ([place]) 04/30/2019  . Acute metabolic encephalopathy 40/11/2723  . Hypoxia 04/30/2019  . Acute Femoral condyle fracture ([place]) 04/30/2019  . Severe sepsis ([place]) 04/30/2019  . Alcohol abuse 01/14/2018  . Substance induced mood disorder ([place]) 01/14/2018   PCP:  Patient, No Pcp Per Pharmacy:  No Pharmacies Listed    Social Determinants of Health (SDOH) Interventions    Readmission Risk Interventions No flowsheet data found.

## 2019-05-04 ENCOUNTER — Emergency Department: Payer: Self-pay

## 2019-05-04 ENCOUNTER — Other Ambulatory Visit: Payer: Self-pay

## 2019-05-04 ENCOUNTER — Emergency Department
Admission: EM | Admit: 2019-05-04 | Discharge: 2019-05-04 | Disposition: A | Discharge status: Home / Self Care | Payer: Self-pay | Attending: Student | Admitting: Student

## 2019-05-04 ENCOUNTER — Encounter: Payer: Self-pay | Admitting: *Deleted

## 2019-05-04 DIAGNOSIS — F17210 Nicotine dependence, cigarettes, uncomplicated: Secondary | ICD-10-CM | POA: Insufficient documentation

## 2019-05-04 DIAGNOSIS — R400 Somnolence: Secondary | ICD-10-CM | POA: Insufficient documentation

## 2019-05-04 DIAGNOSIS — I10 Essential (primary) hypertension: Secondary | ICD-10-CM | POA: Insufficient documentation

## 2019-05-04 DIAGNOSIS — Z590 Homelessness: Secondary | ICD-10-CM | POA: Insufficient documentation

## 2019-05-04 DIAGNOSIS — Z79899 Other long term (current) drug therapy: Secondary | ICD-10-CM | POA: Insufficient documentation

## 2019-05-04 DIAGNOSIS — M79605 Pain in left leg: Secondary | ICD-10-CM | POA: Insufficient documentation

## 2019-05-04 LAB — URINALYSIS, COMPLETE (UACMP) WITH MICROSCOPIC
Bacteria, UA: NONE SEEN
Bilirubin Urine: NEGATIVE
Glucose, UA: NEGATIVE mg/dL
Hgb urine dipstick: NEGATIVE
Ketones, ur: NEGATIVE mg/dL
Leukocytes,Ua: NEGATIVE
Nitrite: NEGATIVE
Protein, ur: NEGATIVE mg/dL
Specific Gravity, Urine: 1.006 (ref 1.005–1.030)
Squamous Epithelial / HPF: NONE SEEN (ref 0–5)
pH: 6 (ref 5.0–8.0)

## 2019-05-04 LAB — BASIC METABOLIC PANEL
Anion gap: 11 (ref 5–15)
BUN: 10 mg/dL (ref 6–20)
CO2: 24 mmol/L (ref 22–32)
Calcium: 8.6 mg/dL — ABNORMAL LOW (ref 8.9–10.3)
Chloride: 104 mmol/L (ref 98–111)
Creatinine, Ser: 0.83 mg/dL (ref 0.61–1.24)
GFR calc Af Amer: >60 mL/min (ref >60)
GFR calc non Af Amer: >60 mL/min (ref >60)
Glucose, Bld: 93 mg/dL (ref 70–99)
Potassium: 3.7 mmol/L (ref 3.5–5.1)
Sodium: 139 mmol/L (ref 135–145)

## 2019-05-04 LAB — URINE DRUG SCREEN, QUALITATIVE (ARMC ONLY)
Amphetamines, Ur Screen: NOT DETECTED
Barbiturates, Ur Screen: NOT DETECTED
Benzodiazepine, Ur Scrn: NOT DETECTED
Cannabinoid 50 Ng, Ur ~~LOC~~: NOT DETECTED
Cocaine Metabolite,Ur ~~LOC~~: NOT DETECTED
MDMA (Ecstasy)Ur Screen: NOT DETECTED
Methadone Scn, Ur: NOT DETECTED
Opiate, Ur Screen: NOT DETECTED
Phencyclidine (PCP) Ur S: NOT DETECTED
Tricyclic, Ur Screen: NOT DETECTED

## 2019-05-04 LAB — ETHANOL: Alcohol, Ethyl (B): <10 mg/dL (ref <10)

## 2019-05-04 LAB — SALICYLATE LEVEL: Salicylate Lvl: <7 mg/dL — ABNORMAL LOW (ref 7.0–30.0)

## 2019-05-04 LAB — ACETAMINOPHEN LEVEL: Acetaminophen (Tylenol), Serum: <10 ug/mL — ABNORMAL LOW (ref 10–30)

## 2019-05-04 NOTE — ED Triage Notes (Signed)
[facility]  Pt reports back pain and left leg pain.  Pt wearing a cast boot on lower left leg.   Pt discharged from [facility] last week.  Pt is homeless and states the cold weather is making his pain worse.  Pt calm and cooperative.

## 2019-05-04 NOTE — ED Notes (Signed)
Patient removed nasal cannula, replaced on patient

## 2019-05-04 NOTE — ED Notes (Signed)
Patient refused to provide urine sample, received order for in-and-out cath

## 2019-05-04 NOTE — ED Notes (Signed)
Spoke with patient and a security guard at bedside. Patient was refusing to leave because he's not sure where to go, said his dad is deceased and his "pastor" friend is in the hospital. Asked if we could call friends to get him or any other family member, he said there's no one.   This RN reviewed TOC notes from prior admission 2 days ago. Patient declined cab vouchers multiple times and said a friend would let him stay with her. Advised patient to go back to [facility] where they could screen him for admission. Patient said he didn't want to go since he wasn't guaranteed admission. Helped patient back his belongings into two bags, he had multiple medical supplies and 2 packs of cigarettes. Patient became agitated when this RN was helping him with his bags.  Knee brace and boot applied to patient's liking, patient was dressed and wheeled to front lobby where he received bus pas to get to [facility]. Also given instructions on how to get there. Patient is extremely hostile and has to be escorted out. All belongings are with patient

## 2019-05-04 NOTE — ED Notes (Signed)
Patient is refusing to leave. Security is assisting, patient now asking to be placed somewhere

## 2019-05-04 NOTE — Discharge Instructions (Addendum)
Thank you for letting us take care of you in the emergency department today.  ° °Please continue to take any regular, prescribed medications.  ° °Please return to the ER for any new or worsening symptoms.  ° °

## 2019-05-04 NOTE — ED Notes (Signed)
Patient appears intoxicated, does not follow directions well and is verbally aggressive towards this Clinical research associate

## 2019-05-04 NOTE — ED Notes (Signed)
Patient transported to CT 

## 2019-05-04 NOTE — ED Notes (Signed)
Patient refused vitals at discharge and also refused to sign topaz e-signature

## 2019-05-04 NOTE — ED Notes (Signed)
MD aware of desat with sleeping, ordered portable xray

## 2019-05-04 NOTE — ED Notes (Signed)
Ultrasound at bedside

## 2019-05-04 NOTE — ED Notes (Signed)
Patient is resting

## 2019-05-04 NOTE — ED Notes (Signed)
Xray completed

## 2019-05-04 NOTE — ED Notes (Signed)
Applied 2L O2 for desats while patient is sleeping. Patient endorses 1 alcoholic drink tonight, appears very intoxicated. Says he took his regular medications and nothing else

## 2019-05-04 NOTE — ED Notes (Signed)
Patient is extremely rude to this Clinical research associate, refuses to open eyes and speak with RN and EDP
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
## 2019-05-05 LAB — CULTURE, BLOOD (ROUTINE X 2)
Culture: NO GROWTH
Culture: NO GROWTH
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
## 2019-05-19 ENCOUNTER — Ambulatory Visit: Payer: Self-pay | Admitting: Gerontology

## 2019-06-12 ENCOUNTER — Emergency Department: Payer: Self-pay

## 2019-06-12 ENCOUNTER — Other Ambulatory Visit: Payer: Self-pay

## 2019-06-12 ENCOUNTER — Emergency Department
Admission: EM | Admit: 2019-06-12 | Discharge: 2019-06-14 | Disposition: A | Discharge status: Home / Self Care | Payer: Self-pay | Attending: Emergency Medicine | Admitting: Emergency Medicine

## 2019-06-12 ENCOUNTER — Encounter: Payer: Self-pay | Admitting: *Deleted

## 2019-06-12 DIAGNOSIS — S299XXA Unspecified injury of thorax, initial encounter: Secondary | ICD-10-CM | POA: Insufficient documentation

## 2019-06-12 DIAGNOSIS — F329 Major depressive disorder, single episode, unspecified: Secondary | ICD-10-CM

## 2019-06-12 DIAGNOSIS — F17210 Nicotine dependence, cigarettes, uncomplicated: Secondary | ICD-10-CM | POA: Insufficient documentation

## 2019-06-12 DIAGNOSIS — Y929 Unspecified place or not applicable: Secondary | ICD-10-CM | POA: Insufficient documentation

## 2019-06-12 DIAGNOSIS — W19XXXA Unspecified fall, initial encounter: Secondary | ICD-10-CM | POA: Insufficient documentation

## 2019-06-12 DIAGNOSIS — Y9389 Activity, other specified: Secondary | ICD-10-CM | POA: Insufficient documentation

## 2019-06-12 DIAGNOSIS — F32A Depression, unspecified: Secondary | ICD-10-CM

## 2019-06-12 DIAGNOSIS — Z87820 Personal history of traumatic brain injury: Secondary | ICD-10-CM | POA: Insufficient documentation

## 2019-06-12 DIAGNOSIS — Z20822 Contact with and (suspected) exposure to covid-19: Secondary | ICD-10-CM | POA: Insufficient documentation

## 2019-06-12 DIAGNOSIS — F332 Major depressive disorder, recurrent severe without psychotic features: Secondary | ICD-10-CM | POA: Insufficient documentation

## 2019-06-12 DIAGNOSIS — R079 Chest pain, unspecified: Secondary | ICD-10-CM

## 2019-06-12 DIAGNOSIS — Z590 Homelessness: Secondary | ICD-10-CM | POA: Insufficient documentation

## 2019-06-12 DIAGNOSIS — R0789 Other chest pain: Secondary | ICD-10-CM | POA: Insufficient documentation

## 2019-06-12 DIAGNOSIS — S82832A Other fracture of upper and lower end of left fibula, initial encounter for closed fracture: Secondary | ICD-10-CM

## 2019-06-12 DIAGNOSIS — S99912A Unspecified injury of left ankle, initial encounter: Secondary | ICD-10-CM | POA: Insufficient documentation

## 2019-06-12 DIAGNOSIS — Y999 Unspecified external cause status: Secondary | ICD-10-CM | POA: Insufficient documentation

## 2019-06-12 DIAGNOSIS — R45851 Suicidal ideations: Secondary | ICD-10-CM | POA: Insufficient documentation

## 2019-06-12 DIAGNOSIS — F1011 Alcohol abuse, in remission: Secondary | ICD-10-CM | POA: Insufficient documentation

## 2019-06-12 DIAGNOSIS — S8265XA Nondisplaced fracture of lateral malleolus of left fibula, initial encounter for closed fracture: Secondary | ICD-10-CM | POA: Insufficient documentation

## 2019-06-12 LAB — URINE DRUG SCREEN, QUALITATIVE (ARMC ONLY)
Amphetamines, Ur Screen: NOT DETECTED
Barbiturates, Ur Screen: NOT DETECTED
Benzodiazepine, Ur Scrn: NOT DETECTED
Cannabinoid 50 Ng, Ur ~~LOC~~: NOT DETECTED
Cocaine Metabolite,Ur ~~LOC~~: NOT DETECTED
MDMA (Ecstasy)Ur Screen: NOT DETECTED
Methadone Scn, Ur: NOT DETECTED
Opiate, Ur Screen: NOT DETECTED
Phencyclidine (PCP) Ur S: NOT DETECTED
Tricyclic, Ur Screen: NOT DETECTED

## 2019-06-12 LAB — COMPREHENSIVE METABOLIC PANEL
ALT: 9 U/L (ref 0–44)
AST: 14 U/L — ABNORMAL LOW (ref 15–41)
Albumin: 4.4 g/dL (ref 3.5–5.0)
Alkaline Phosphatase: 84 U/L (ref 38–126)
Anion gap: 8 (ref 5–15)
BUN: 15 mg/dL (ref 6–20)
CO2: 26 mmol/L (ref 22–32)
Calcium: 9.3 mg/dL (ref 8.9–10.3)
Chloride: 105 mmol/L (ref 98–111)
Creatinine, Ser: 0.81 mg/dL (ref 0.61–1.24)
GFR calc Af Amer: >60 mL/min (ref >60)
GFR calc non Af Amer: >60 mL/min (ref >60)
Glucose, Bld: 91 mg/dL (ref 70–99)
Potassium: 3.5 mmol/L (ref 3.5–5.1)
Sodium: 139 mmol/L (ref 135–145)
Total Bilirubin: 0.8 mg/dL (ref 0.3–1.2)
Total Protein: 8.3 g/dL — ABNORMAL HIGH (ref 6.5–8.1)

## 2019-06-12 LAB — CBC
HCT: 45.2 % (ref 39.0–52.0)
Hemoglobin: 15.4 g/dL (ref 13.0–17.0)
MCH: 30.3 pg (ref 26.0–34.0)
MCHC: 34.1 g/dL (ref 30.0–36.0)
MCV: 89 fL (ref 80.0–100.0)
Platelets: 294 10*3/uL (ref 150–400)
RBC: 5.08 MIL/uL (ref 4.22–5.81)
RDW: 14.6 % (ref 11.5–15.5)
WBC: 8.3 10*3/uL (ref 4.0–10.5)
nRBC: 0 % (ref 0.0–0.2)

## 2019-06-12 LAB — TROPONIN I (HIGH SENSITIVITY)
Troponin I (High Sensitivity): 3 ng/L (ref <18)
Troponin I (High Sensitivity): <2 ng/L (ref <18)

## 2019-06-12 LAB — ETHANOL: Alcohol, Ethyl (B): <10 mg/dL (ref <10)

## 2019-06-12 LAB — SALICYLATE LEVEL: Salicylate Lvl: <7 mg/dL — ABNORMAL LOW (ref 7.0–30.0)

## 2019-06-12 LAB — ACETAMINOPHEN LEVEL: Acetaminophen (Tylenol), Serum: <10 ug/mL — ABNORMAL LOW (ref 10–30)

## 2019-06-12 MED ORDER — GABAPENTIN 300 MG PO CAPS
300.0000 mg | ORAL_CAPSULE | Freq: Three times a day (TID) | ORAL | Status: DC
  Administered 2019-06-12 – 2019-06-14 (×6): 300 mg via ORAL
  Filled 2019-06-12 (×6): qty 1

## 2019-06-12 MED ORDER — DIVALPROEX SODIUM ER 250 MG PO TB24
1000.0000 mg | ORAL_TABLET | Freq: Every day | ORAL | Status: DC
  Administered 2019-06-13 – 2019-06-14 (×2): 1000 mg via ORAL
  Filled 2019-06-12 (×2): qty 4

## 2019-06-12 MED ORDER — ACETAMINOPHEN 500 MG PO TABS
500.0000 mg | ORAL_TABLET | Freq: Three times a day (TID) | ORAL | Status: DC | PRN
  Administered 2019-06-12: 500 mg via ORAL
  Filled 2019-06-12: qty 1

## 2019-06-12 MED ORDER — MELATONIN 5 MG PO TABS
5.0000 mg | ORAL_TABLET | Freq: Every day | ORAL | Status: DC
  Administered 2019-06-13 (×2): 5 mg via ORAL
  Filled 2019-06-12 (×4): qty 1

## 2019-06-12 MED ORDER — TRAMADOL HCL 50 MG PO TABS
50.0000 mg | ORAL_TABLET | Freq: Three times a day (TID) | ORAL | Status: DC | PRN
  Administered 2019-06-13: 50 mg via ORAL
  Filled 2019-06-12: qty 1
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
## 2019-06-13 MED ORDER — IBUPROFEN 600 MG PO TABS
600.0000 mg | ORAL_TABLET | Freq: Four times a day (QID) | ORAL | Status: DC | PRN
  Administered 2019-06-13: 600 mg via ORAL
  Filled 2019-06-13: qty 1

## 2019-06-13 NOTE — ED Notes (Signed)
100% Dinner eaten

## 2019-06-13 NOTE — ED Notes (Signed)
VOL/  PENDING  SOCIAL  WORK  CONSULT

## 2019-06-13 NOTE — ED Notes (Signed)
Patient went too restroom.

## 2019-06-13 NOTE — ED Notes (Signed)
Drink given 

## 2019-06-13 NOTE — ED Notes (Signed)
Tray given. ?

## 2019-06-13 NOTE — ED Notes (Signed)
Patient ate 100% of lunch and beverage.  

## 2019-06-13 NOTE — ED Provider Notes (Signed)
-----------------------------------------   12:05 PM on 06/13/2019 -----------------------------------------  This patient is pending psychiatry evaluation.  He also had pain in his left ankle and an x-ray from last night showing an acute fibular fracture which was splinted.  The patient now complains that the splint is bothering him especially at the back of the leg near his knee and feels it is too long and jutting into his knee.  He also is doubtful that there is actually a fracture, and thinks that he just pulled the ankle.  I reexamined the patient and reviewed to the x-ray from today, in comparison to an x-ray of the tibia and fibula from March 6.  Although there was a chronic healing fracture on the old x-ray, the x-ray from last night is consistent with an acute fracture in the same area.  I explained my recommendation that the splint therefore should stay on.  I was able to cut the Ortho-Glass back about 1 inch posteriorly for better comfort.  However he continued to complain about the splint, insisted that he did not need it and stated he would take it off.  I again explained the x-ray findings and specifically that I had compared to an old image.  I advised that if he removes the splint he is at risk for nonhealing fracture, permanent injury, and permanent disability to the leg.  He expressed understanding.   Dionne Bucy, MD 06/13/19 [phone]
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
## 2019-06-13 NOTE — ED Notes (Signed)
Pt on phone with SW 

## 2019-06-14 ENCOUNTER — Other Ambulatory Visit: Payer: Self-pay

## 2019-06-14 ENCOUNTER — Encounter: Payer: Self-pay | Admitting: Emergency Medicine

## 2019-06-14 ENCOUNTER — Emergency Department
Admission: EM | Admit: 2019-06-14 | Discharge: 2019-06-15 | Disposition: A | Discharge status: Home / Self Care | Payer: Self-pay | Attending: Emergency Medicine | Admitting: Emergency Medicine

## 2019-06-14 ENCOUNTER — Emergency Department: Payer: Self-pay

## 2019-06-14 DIAGNOSIS — M545 Low back pain: Secondary | ICD-10-CM | POA: Insufficient documentation

## 2019-06-14 DIAGNOSIS — W19XXXA Unspecified fall, initial encounter: Secondary | ICD-10-CM

## 2019-06-14 DIAGNOSIS — F329 Major depressive disorder, single episode, unspecified: Secondary | ICD-10-CM | POA: Insufficient documentation

## 2019-06-14 DIAGNOSIS — Y9389 Activity, other specified: Secondary | ICD-10-CM | POA: Insufficient documentation

## 2019-06-14 DIAGNOSIS — W010XXA Fall on same level from slipping, tripping and stumbling without subsequent striking against object, initial encounter: Secondary | ICD-10-CM | POA: Insufficient documentation

## 2019-06-14 DIAGNOSIS — F1994 Other psychoactive substance use, unspecified with psychoactive substance-induced mood disorder: Secondary | ICD-10-CM | POA: Insufficient documentation

## 2019-06-14 DIAGNOSIS — F32A Depression, unspecified: Secondary | ICD-10-CM

## 2019-06-14 DIAGNOSIS — Y92538 Other ambulatory health services establishments as the place of occurrence of the external cause: Secondary | ICD-10-CM | POA: Insufficient documentation

## 2019-06-14 DIAGNOSIS — F17210 Nicotine dependence, cigarettes, uncomplicated: Secondary | ICD-10-CM | POA: Insufficient documentation

## 2019-06-14 DIAGNOSIS — S82832D Other fracture of upper and lower end of left fibula, subsequent encounter for closed fracture with routine healing: Secondary | ICD-10-CM | POA: Insufficient documentation

## 2019-06-14 DIAGNOSIS — M25552 Pain in left hip: Secondary | ICD-10-CM | POA: Insufficient documentation

## 2019-06-14 DIAGNOSIS — I10 Essential (primary) hypertension: Secondary | ICD-10-CM | POA: Insufficient documentation

## 2019-06-14 DIAGNOSIS — Y999 Unspecified external cause status: Secondary | ICD-10-CM | POA: Insufficient documentation

## 2019-06-14 DIAGNOSIS — Z046 Encounter for general psychiatric examination, requested by authority: Secondary | ICD-10-CM | POA: Insufficient documentation

## 2019-06-14 DIAGNOSIS — M25572 Pain in left ankle and joints of left foot: Secondary | ICD-10-CM | POA: Insufficient documentation

## 2019-06-14 DIAGNOSIS — R45851 Suicidal ideations: Secondary | ICD-10-CM | POA: Insufficient documentation

## 2019-06-14 LAB — RESPIRATORY PANEL BY RT PCR (FLU A&B, COVID)
Influenza A by PCR: NEGATIVE
Influenza B by PCR: NEGATIVE
SARS Coronavirus 2 by RT PCR: NEGATIVE

## 2019-06-14 NOTE — ED Provider Notes (Signed)
Emergency Medicine Observation Re-evaluation Note  John [name] is a 39 y.o. male, seen on rounds today.  Pt initially presented to the ED for complaints of Suicidal, Homicidal, and Hallucinations Currently, the patient is resting.Marland Kitchen  Physical Exam  BP 128/81 (BP Location: Left Arm)   Pulse (!) 104   Temp 97.7 F (36.5 C) (Oral)   Resp 16   Ht 1.753 m (5\' 9" )   Wt 73 kg   SpO2 99%   BMI 23.78 kg/m  Physical Exam  ED Course / MDM  EKG:EKG Interpretation  Date/Time:  Sunday June 12 2019 19:42:22 EDT Ventricular Rate:  101 PR Interval:  138 QRS Duration: 96 QT Interval:  326 QTC Calculation: 422 R Axis:   90 Text Interpretation: Sinus tachycardia Rightward axis Borderline ECG When compared with ECG of 30-Apr-2019 00:19, PREVIOUS ECG IS PRESENT Confirmed by UNCONFIRMED, DOCTOR (02-May-2019), editor [id number], Tammy [phone]) on 06/13/2019 10:28:51 AM    I have reviewed the labs performed to date as well as medications administered while in observation.  Recent changes in the last 24 hours include evaluation by telepsych who recommended inpatient psychiatric treatment.  TTS is currently working on getting the patient downstairs.  The telepsych recommendation was for voluntary admission which the patient wants anyway.  They do not feel he needs to be involuntarily committed. Plan  Current plan is for admission to [facility] behavioral medicine.. Patient is not under full IVC at this time.   [facility], MD 06/14/19 [phone]
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
## 2019-06-14 NOTE — ED Notes (Signed)
Clothes returned to patient. Patient states he has no where to go. It was explained to patient that he has been medically cleared and that psych has also cleared him for discharge. Discharge papers included shelters that he can call and go to if he is homeless.

## 2019-06-14 NOTE — ED Notes (Signed)
Pt given a soda per request.

## 2019-06-14 NOTE — ED Notes (Signed)
Psych at bedside to re-assess, plan to discharge with out patient resources.
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
## 2019-06-14 NOTE — ED Notes (Signed)
 of urine emptied from urinal. Pt given breakfast.
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
## 2019-06-14 NOTE — ED Notes (Signed)
Pharmacy called and spoke with tech about not receiving medications due at 1000, ( Depakote ) will be sending up now.

## 2019-06-14 NOTE — ED Notes (Signed)
 of urine emptied from urinal. Pt given drink per request.

## 2019-06-14 NOTE — ED Notes (Signed)
SOC camera to pt, provided crackers, PB and coke per request.

## 2019-06-14 NOTE — ED Notes (Signed)
Assumed care of patient. Patient calm and cooperative awaiting psych eval this morning. Reports continues to feel SI. Safety maintained. Awaiting further plan of care. Will continue to monitor.

## 2019-06-14 NOTE — ED Notes (Signed)
Pt has been been reviewed by Psychiatric Unit charge nurse who has recommended that the pt be reviewed by the psychiatric team in the AM due to inconsistency in the pts presentation. Notes indicate that the pt has denied SI/HI. SWK consult indicates that there may be concerns surrounding secondary gain.

## 2019-06-14 NOTE — ED Notes (Signed)
Pt ambulated to bathroom with assistance using cane. Hand hygiene preformed.

## 2019-06-14 NOTE — ED Notes (Signed)
Pt denies SI or HI at this time. States he did have them earlier

## 2019-06-14 NOTE — Consult Note (Signed)
[facility] Face-to-Face Psychiatry Consult   Reason for Consult:  suicidality Referring Physician:  ED MD Patient Identification: John [name] MRN:  [id number] Principal Diagnosis: Depressive disorder Diagnosis:  Principal Problem:   Depressive disorder   Total Time spent with patient: 15 minutes  Subjective:   John [name] is a 39 y.o. male patient admitted with suicidal thoughts HPI: HPI per ED MD John [name] is a 39 y.o. male with a history as noted below who presents with complaints of depression, feeling suicidal.  Also complains of pain in his left side.  He reports chronic pain status post severe accident 1 year ago.  Apparently had a fall several days ago and since then has had mild left-sided chest discomfort as well as pain in his left lateral ankle.  Denies self injury.  Also reports he recently got out of jail and no longer has a place to live  From Prior eval in May 04, 2019 HPI John [name] is a 39 y.o. male who presents via EMS from [facility], complains of left leg pain. Patient recently admitted from 3/6 to 3/8 for left leg pain (and related injuries, as below) and encephalopathy 2/2 alcohol intoxication. Had left foot/ankle/knee pain as a result of fall, XRs revealed possible left lateral femoral condyle fracture and possible chronic avulsion of medial malleolus. Orthopedics recommendedknee immobilizer and CAM walker boot. Patient was discharged with these as well as a walker and a wheelchair. He was supposed to be discharged to a friend's house, but states this did not work out and has been homeless since then.   Initially complains of left leg pain but then says that his pain is better compared to hospitalization. Refuses to localize the pain, but then states majority of pain in the ankle area. Does not provide further history. Denies any alcohol or drug use. No new trauma   On my evaluation patient is fairly belligerent, cursing and rude. He continuously falls asleep  during questions and yells at me for waking him up to obtain history. He states he is extremely tired because he hasn't slept since discharge 2/2 homelessness.   Caveat: hx limited due to patient lack of cooperation   Past Psychiatric History:  Risk to Self: Suicidal Ideation: No Suicidal Intent: No Is patient at risk for suicide?: Yes Suicidal Plan?: No What has been your use of drugs/alcohol within the last 12 months?: daily use  How many times?: 0 Intentional Self Injurious Behavior: None Risk to Others: Homicidal Ideation: No Thoughts of Harm to Others: No Current Homicidal Intent: No Current Homicidal Plan: No Access to Homicidal Means: No History of harm to others?: No Assessment of Violence: None Noted Does patient have access to weapons?: No Criminal Charges Pending?: No Does patient have a court date: No Prior Inpatient Therapy: Prior Inpatient Therapy: No Prior Outpatient Therapy: Prior Outpatient Therapy: Yes Prior Therapy Dates: 2020-03/2019 Prior Therapy Facilty/Provider(s): While in prison  Reason for Treatment: Bipolar Disorder  Does patient have an ACCT team?: No Does patient have Intensive In-House Services?  : No Does patient have [facility] services? : No Does patient have P4CC services?: No  Past Medical History:  Past Medical History:  Diagnosis Date  . ASD (atrial septal defect)   . Bacterial infection due to H. pylori   . Hypertension   . Kidney stones     Past Surgical History:  Procedure Laterality Date  . ASD REPAIR    . FOOT SURGERY Right   .  KNEE SURGERY Right    Family History: History reviewed. No pertinent family history. 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 Marital status: Single    Spouse name: Not on file  . Number of children: Not on file  . Years of education: Not on file  . Highest education level: Not on file  Occupational History  . Not on file  Tobacco Use  . Smoking status: Current Every Day Smoker    Packs/day: 0.50    Types: Cigarettes  . Smokeless tobacco: Never Used  Substance and Sexual Activity  . 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 Drug use: Not Currently  . Sexual activity: Yes    Birth control/protection: None  Other Topics Concern  . Not on file  Social History Narrative  . Not on file   Social Determinants of Health   Financial Resource Strain:   . Difficulty of Paying Living Expenses:   Food Insecurity:   . Worried About Programme researcher, broadcasting/film/video in the Last Year:   . Barista in the Last Year:   Transportation Needs:   . Freight forwarder (Medical):   Marland Kitchen Lack of Transportation (Non-Medical):   Physical Activity:   . Days of Exercise per Week:   . Minutes of Exercise per Session:   Stress:   . Feeling of Stress :   Social Connections:   . Frequency of Communication with Friends and Family:   . Frequency of Social Gatherings with Friends and Family:   . Attends Religious Services:   . Active Member of Clubs or Organizations:   . Attends Banker Meetings:   Marland Kitchen Marital Status:    Additional Social History:    Allergies:   Allergies  Allergen Reactions  . Penicillins     Did it involve swelling of the face/tongue/throat, SOB, or low BP? No Did it involve sudden or severe rash/hives, skin peeling, or any reaction on the inside of your mouth or nose? No Did you need to seek medical attention at a hospital or doctor's office? Unknown When did it last happen? If all above answers are "NO", may proceed with cephalosporin use.    Labs:  Results for orders placed or  performed during the hospital encounter of 06/12/19 (from the past 48 hour(s))  Comprehensive metabolic panel     Status: Abnormal   Collection Time: 06/12/19  7:53 PM  Result Value Ref Range   Sodium 139 135 - 145 mmol/L   Potassium 3.5 3.5 - 5.1 mmol/L   Chloride 105 98 - 111 mmol/L   CO2 26 22 - 32 mmol/L   Glucose, Bld 91 70 - 99 mg/dL    Comment: Glucose reference range applies only to samples taken after fasting for at least 8 hours.   BUN 15 6 - 20 mg/dL   Creatinine, Ser 4.54 0.61 - 1.24 mg/dL   Calcium 9.3 8.9 - 09.8 mg/dL   Total Protein 8.3 (H) 6.5 - 8.1 g/dL   Albumin 4.4 3.5 - 5.0 g/dL   AST 14 (  L) 15 - 41 U/L   ALT 9 0 - 44 U/L   Alkaline Phosphatase 84 38 - 126 U/L   Total Bilirubin 0.8 0.3 - 1.2 mg/dL   GFR calc non Af Amer >60 >60 mL/min   GFR calc Af Amer >60 >60 mL/min   Anion gap 8 5 - 15    Comment: Performed at [facility], [address] [place], [place] [address]  Ethanol     Status: None   Collection Time: 06/12/19  7:53 PM  Result Value Ref Range   Alcohol, Ethyl (B) <10 <10 mg/dL    Comment: (NOTE) Lowest detectable limit for serum alcohol is 10 mg/dL. For medical purposes only. Performed at [facility], [address] [place], [place] [address]   Salicylate level     Status: Abnormal   Collection Time: 06/12/19  7:53 PM  Result Value Ref Range   Salicylate Lvl <7.0 (L) 7.0 - 30.0 mg/dL    Comment: Performed at [facility], [address] [place], [place] [address]  Acetaminophen level     Status: Abnormal   Collection Time: 06/12/19  7:53 PM  Result Value Ref Range   Acetaminophen (Tylenol), Serum <10 (L) 10 - 30 ug/mL    Comment: (NOTE) Therapeutic concentrations vary significantly. A range of 10-30 ug/mL  may be an effective concentration for many patients. However, some  are best treated at concentrations outside of this range. Acetaminophen concentrations >150 ug/mL at 4 hours after ingestion  and >50  ug/mL at 12 hours after ingestion are often associated with  toxic reactions. Performed at [facility], [address] [place], [place] [address]   cbc     Status: None   Collection Time: 06/12/19  7:53 PM  Result Value Ref Range   WBC 8.3 4.0 - 10.5 K/uL   RBC 5.08 4.22 - 5.81 MIL/uL   Hemoglobin 15.4 13.0 - 17.0 g/dL   HCT 29.545.2 62.139.0 - 30.852.0 %   MCV 89.0 80.0 - 100.0 fL   MCH 30.3 26.0 - 34.0 pg   MCHC 34.1 30.0 - 36.0 g/dL   RDW 65.714.6 84.611.5 - 96.215.5 %   Platelets 294 150 - 400 K/uL   nRBC 0.0 0.0 - 0.2 %    Comment: Performed at [facility], [address] [place], [place] [address]  Urine Drug Screen, Qualitative     Status: None   Collection Time: 06/12/19  7:53 PM  Result Value Ref Range   Tricyclic, Ur Screen NONE DETECTED NONE DETECTED   Amphetamines, Ur Screen NONE DETECTED NONE DETECTED   MDMA (Ecstasy)Ur Screen NONE DETECTED NONE DETECTED   Cocaine Metabolite,Ur [place] NONE DETECTED NONE DETECTED   Opiate, Ur Screen NONE DETECTED NONE DETECTED   Phencyclidine (PCP) Ur S NONE DETECTED NONE DETECTED   Cannabinoid 50 Ng, Ur [place] NONE DETECTED NONE DETECTED   Barbiturates, Ur Screen NONE DETECTED NONE DETECTED   Benzodiazepine, Ur Scrn NONE DETECTED NONE DETECTED   Methadone Scn, Ur NONE DETECTED NONE DETECTED    Comment: (NOTE) Tricyclics + metabolites, urine    Cutoff 1000 ng/mL Amphetamines + metabolites, urine  Cutoff 1000 ng/mL MDMA (Ecstasy), urine              Cutoff 500 ng/mL Cocaine Metabolite, urine          Cutoff 300 ng/mL Opiate + metabolites, urine        Cutoff 300 ng/mL Phencyclidine (PCP), urine  Cutoff 25 ng/mL Cannabinoid, urine                 Cutoff 50 ng/mL Barbiturates + metabolites, urine  Cutoff 200 ng/mL Benzodiazepine, urine              Cutoff 200 ng/mL Methadone, urine                   Cutoff 300 ng/mL The urine drug screen provides only a preliminary, unconfirmed analytical test result and should not be used for  non-medical purposes. Clinical consideration and professional judgment should be applied to any positive drug screen result due to possible interfering substances. A more specific alternate chemical method must be used in order to obtain a confirmed analytical result. Gas chromatography / mass spectrometry (GC/MS) is the preferred confirmat ory method. Performed at [facility], [address] [place], [place] [address]   Troponin I (High Sensitivity)     Status: None   Collection Time: 06/12/19  7:53 PM  Result Value Ref Range   Troponin I (High Sensitivity) 3 <18 ng/L    Comment: (NOTE) Elevated high sensitivity troponin I (hsTnI) values and significant  changes across serial measurements may suggest ACS but many other  chronic and acute conditions are known to elevate hsTnI results.  Refer to the "Links" section for chest pain algorithms and additional  guidance. Performed at [facility], [address] [place], [place] [address]   Troponin I (High Sensitivity)     Status: None   Collection Time: 06/12/19 10:42 PM  Result Value Ref Range   Troponin I (High Sensitivity) <2 <18 ng/L    Comment: (NOTE) Elevated high sensitivity troponin I (hsTnI) values and significant  changes across serial measurements may suggest ACS but many other  chronic and acute conditions are known to elevate hsTnI results.  Refer to the "Links" section for chest pain algorithms and additional  guidance. Performed at [facility], [address] [place], [place] [address]   Respiratory Panel by RT PCR (Flu A&B, Covid) - Nasopharyngeal Swab     Status: None   Collection Time: 06/13/19 11:44 PM   Specimen: Nasopharyngeal Swab  Result Value Ref Range   SARS Coronavirus 2 by RT PCR NEGATIVE NEGATIVE    Comment: (NOTE) SARS-CoV-2 target nucleic acids are NOT DETECTED. The SARS-CoV-2 RNA is generally detectable in upper respiratoy specimens during the acute phase of  infection. The lowest concentration of SARS-CoV-2 viral copies this assay can detect is 131 copies/mL. A negative result does not preclude SARS-Cov-2 infection and should not be used as the sole basis for treatment or other patient management decisions. A negative result may occur with  improper specimen collection/handling, submission of specimen other than nasopharyngeal swab, presence of viral mutation(s) within the areas targeted by this assay, and inadequate number of viral copies (<131 copies/mL). A negative result must be combined with clinical observations, patient history, and epidemiological information. The expected result is Negative. Fact Sheet for Patients:  https://www.moore.com/ Fact Sheet for Healthcare Providers:  https://www.young.biz/ This test is not yet ap proved or cleared by the [place] FDA and  has been authorized for detection and/or diagnosis of SARS-CoV-2 by FDA under an Emergency Use Authorization (EUA). This EUA will remain  in effect (meaning this test can be used) for the duration of the COVID-19 declaration under Section 564(b)(1) of the Act, 21 U.S.C. section 360bbb-3(b)(1), unless the authorization is terminated or revoked sooner.  Influenza A by PCR NEGATIVE NEGATIVE   Influenza B by PCR NEGATIVE NEGATIVE    Comment: (NOTE) The Xpert Xpress SARS-CoV-2/FLU/RSV assay is intended as an aid in  the diagnosis of influenza from Nasopharyngeal swab specimens and  should not be used as a sole basis for treatment. Nasal washings and  aspirates are unacceptable for Xpert Xpress SARS-CoV-2/FLU/RSV  testing. Fact Sheet for Patients: https://www.moore.com/ Fact Sheet for Healthcare Providers: https://www.young.biz/ This test is not yet approved or cleared by the [place] FDA and  has been authorized for detection and/or diagnosis of SARS-CoV-2 by  FDA under an Emergency  Use Authorization (EUA). This EUA will remain  in effect (meaning this test can be used) for the duration of the  Covid-19 declaration under Section 564(b)(1) of the Act, 21  U.S.C. section 360bbb-3(b)(1), unless the authorization is  terminated or revoked. Performed at [facility], [address] [place], [place] [address]     Current Facility-Administered Medications  Medication Dose Route Frequency Provider Last Rate Last Admin  . acetaminophen (TYLENOL) tablet 500 mg  500 mg Oral Q8H PRN Jene Every, MD   500 mg at 06/12/19 2321  . divalproex (DEPAKOTE ER) 24 hr tablet 1,000 mg  1,000 mg Oral Daily Jene Every, MD   1,000 mg at 06/14/19 1316  . gabapentin (NEURONTIN) capsule 300 mg  300 mg Oral Q8H Jene Every, MD   300 mg at 06/14/19 1316  . ibuprofen (ADVIL) tablet 600 mg  600 mg Oral Q6H PRN Dionne Bucy, MD   600 mg at 06/13/19 1601  . melatonin tablet 5 mg  5 mg Oral QHS Jene Every, MD   5 mg at 06/13/19 2140  . traMADol (ULTRAM) tablet 50 mg  50 mg Oral Q8H PRN Jene Every, MD   50 mg at 06/13/19 1128   Current Outpatient Medications  Medication Sig Dispense Refill  . Multiple Vitamin (MULTIVITAMIN WITH MINERALS) TABS tablet Take 1 tablet by mouth daily. (Patient not taking: Reported on 06/13/2019)      Psychiatric Specialty Exam: Physical Exam  Review of Systems  Blood pressure 118/64, pulse 78, temperature 97.7 F (36.5 C), temperature source Oral, resp. rate 18, height  (1.753 m), weight 73 kg, SpO2 98 %.Body mass index is 23.78 kg/m.  General Appearance: Fairly Groomed  Eye Contact:  Good  Speech:  Clear and Coherent  Volume:  Normal  Mood:  Euthymic and Irritable  Affect:  Congruent  Thought Process:  Goal Directed  Orientation:  Full (Time, Place, and Person)  Thought Content:  Logical  Suicidal Thoughts:  No  Homicidal Thoughts:  No  Memory:  Immediate;   Fair  Judgement:  Fair  Insight:  Fair  Psychomotor Activity:   Normal  Concentration:  Concentration: Good  Recall:  Good  Fund of Knowledge:  Good  Language:  Good  Akathisia:  No  Handed:  Ambidextrous  AIMS (if indicated):     Assets:  Others:  None noted  ADL's:  Impaired  Cognition:  WNL  Sleep:      The patient is frustrated that he has no current living arrangement. He has no specific plan for suicide and no active intent. Apparently the stress that led to his original admission was related to the social situation and my moving away from a certain person that stress has resolved.  Almost all of his concerns are related to medical issues and his need to have medical concerns addressed.   Treatment  Plan Summary:  Can seek treatment as outpatient  Dx most consistent with depressive disorder NOS. He is on depakote but I currently see no evidence of mania or bipolar disorder or depression so there is not need for a change in his medication  Disposition: No evidence of imminent risk to self or others at present.   Patient does not meet criteria for psychiatric inpatient admission.  D/C from ED when medically stable Alesia Morin, MD 06/14/2019 4:15 PM

## 2019-06-15 LAB — COMPREHENSIVE METABOLIC PANEL
ALT: 10 U/L (ref 0–44)
AST: 14 U/L — ABNORMAL LOW (ref 15–41)
Albumin: 4.2 g/dL (ref 3.5–5.0)
Alkaline Phosphatase: 79 U/L (ref 38–126)
Anion gap: 10 (ref 5–15)
BUN: 11 mg/dL (ref 6–20)
CO2: 29 mmol/L (ref 22–32)
Calcium: 9.5 mg/dL (ref 8.9–10.3)
Chloride: 102 mmol/L (ref 98–111)
Creatinine, Ser: 0.66 mg/dL (ref 0.61–1.24)
GFR calc Af Amer: >60 mL/min (ref >60)
GFR calc non Af Amer: >60 mL/min (ref >60)
Glucose, Bld: 80 mg/dL (ref 70–99)
Potassium: 3.6 mmol/L (ref 3.5–5.1)
Sodium: 141 mmol/L (ref 135–145)
Total Bilirubin: 0.5 mg/dL (ref 0.3–1.2)
Total Protein: 8.4 g/dL — ABNORMAL HIGH (ref 6.5–8.1)

## 2019-06-15 LAB — CBC WITH DIFFERENTIAL/PLATELET
Abs Immature Granulocytes: 0.03 10*3/uL (ref 0.00–0.07)
Basophils Absolute: 0 10*3/uL (ref 0.0–0.1)
Basophils Relative: 1 %
Eosinophils Absolute: 0 10*3/uL (ref 0.0–0.5)
Eosinophils Relative: 1 %
HCT: 45.7 % (ref 39.0–52.0)
Hemoglobin: 14.6 g/dL (ref 13.0–17.0)
Immature Granulocytes: 0 %
Lymphocytes Relative: 35 %
Lymphs Abs: 2.7 10*3/uL (ref 0.7–4.0)
MCH: 30 pg (ref 26.0–34.0)
MCHC: 31.9 g/dL (ref 30.0–36.0)
MCV: 93.8 fL (ref 80.0–100.0)
Monocytes Absolute: 0.8 10*3/uL (ref 0.1–1.0)
Monocytes Relative: 10 %
Neutro Abs: 4.2 10*3/uL (ref 1.7–7.7)
Neutrophils Relative %: 53 %
Platelets: 291 10*3/uL (ref 150–400)
RBC: 4.87 MIL/uL (ref 4.22–5.81)
RDW: 14.2 % (ref 11.5–15.5)
WBC: 7.8 10*3/uL (ref 4.0–10.5)
nRBC: 0 % (ref 0.0–0.2)

## 2019-06-15 LAB — URINE DRUG SCREEN, QUALITATIVE (ARMC ONLY)
Amphetamines, Ur Screen: NOT DETECTED
Barbiturates, Ur Screen: NOT DETECTED
Benzodiazepine, Ur Scrn: NOT DETECTED
Cannabinoid 50 Ng, Ur ~~LOC~~: NOT DETECTED
Cocaine Metabolite,Ur ~~LOC~~: NOT DETECTED
MDMA (Ecstasy)Ur Screen: NOT DETECTED
Methadone Scn, Ur: NOT DETECTED
Opiate, Ur Screen: NOT DETECTED
Phencyclidine (PCP) Ur S: NOT DETECTED
Tricyclic, Ur Screen: NOT DETECTED

## 2019-06-15 LAB — SALICYLATE LEVEL: Salicylate Lvl: <7 mg/dL — ABNORMAL LOW (ref 7.0–30.0)

## 2019-06-15 LAB — ETHANOL: Alcohol, Ethyl (B): <10 mg/dL (ref <10)

## 2019-06-15 LAB — ACETAMINOPHEN LEVEL: Acetaminophen (Tylenol), Serum: <10 ug/mL — ABNORMAL LOW (ref 10–30)

## 2019-06-15 MED ORDER — MELATONIN 5 MG PO TABS
5.0000 mg | ORAL_TABLET | Freq: Every day | ORAL | Status: DC
  Filled 2019-06-15: qty 1

## 2019-06-15 MED ORDER — MELATONIN 5 MG PO TABS
5.0000 mg | ORAL_TABLET | Freq: Every day | ORAL | Status: DC
  Administered 2019-06-15: 03:00:00 5 mg via ORAL

## 2019-06-15 MED ORDER — DIVALPROEX SODIUM 500 MG PO DR TAB: 500.0000 mg | DELAYED_RELEASE_TABLET | Freq: Two times a day (BID) | ORAL | 0 refills | Status: DC
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
## 2019-06-15 NOTE — ED Notes (Signed)
Ice water given to pt at this time per request.

## 2019-06-15 NOTE — ED Notes (Signed)
Pt assisted to the bathroom and then back to chair.

## 2019-06-15 NOTE — ED Provider Notes (Signed)
The patient has been evaluated at bedside by Dr. Burgess Estelle, psychiatry.  Patient is clinically stable.  Not felt to be a danger to self or others.  No SI or Hi.  No indication for inpatient psychiatric admission at this time.  Appropriate for continued outpatient therapy.    Willy Eddy, MD 06/15/19 1536

## 2019-06-15 NOTE — TOC Transition Note (Addendum)
Transition of Care [facility]) - CM/SW Discharge Note   Patient Details  Name: QUINTEZ MASELLI MRN: [id number] Date of Birth: Apr 02, 1980  [facility] CM/SW Contact:  Marina Goodell Phone Number: [phone] 06/15/2019, 2:21 PM   Clinical Narrative:     Patient has bed offer at [facility] [phone].  This CSW with ED Secretary, patient will need taxi voucher for transport and a mask.  This CSW spoke with EDP, he will send patient script to [facility], spoke with [place] [facility], script will be ready 20-30 minutes after prescription is received.  This CSW will pick up medication once it is ready and bring it to the ED.         Patient Goals and CMS Choice        Discharge Placement  [facility] [phone]                     Discharge Plan and Services                                     Social Determinants of Health (SDOH) Interventions     Readmission Risk Interventions No flowsheet data found.

## 2019-06-15 NOTE — ED Notes (Signed)
Pt moved to 1H due to inapproriate and rude comments to staff and other pts. Pt was cussing and degrading pts in hall. Pt was asked to stop by this nurse multiple times and patient continued to states, "I can do whatever the fuck I want to do, what're you going to do about it?" "Do something." among other things. Charge nurse notified and pt was moved locations away from other pts.

## 2019-06-15 NOTE — ED Provider Notes (Signed)
The patient has been placed in psychiatric observation due to the need to provide a safe environment for the patient while obtaining psychiatric consultation and evaluation, as well as ongoing medical and medication management to treat the patient's condition.  The patient has been placed under full IVC at this time.    Don Perking, [place], MD 06/15/19 John [name]

## 2019-06-15 NOTE — ED Notes (Signed)
Pt given sandwich tray 
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
## 2019-07-12 ENCOUNTER — Ambulatory Visit: Payer: Self-pay | Admitting: Gerontology

## 2019-07-12 ENCOUNTER — Other Ambulatory Visit: Payer: Self-pay

## 2019-07-12 ENCOUNTER — Encounter: Payer: Self-pay | Admitting: Gerontology

## 2019-07-12 VITALS — BP 129/81 | HR 99 | Temp 97.2°F | Ht 69.0 in | Wt 176.0 lb

## 2019-07-12 DIAGNOSIS — M79605 Pain in left leg: Secondary | ICD-10-CM

## 2019-07-12 DIAGNOSIS — IMO0001 Reserved for inherently not codable concepts without codable children: Secondary | ICD-10-CM

## 2019-07-12 DIAGNOSIS — F17200 Nicotine dependence, unspecified, uncomplicated: Secondary | ICD-10-CM

## 2019-07-12 DIAGNOSIS — Z978 Presence of other specified devices: Secondary | ICD-10-CM

## 2019-07-12 DIAGNOSIS — Z8659 Personal history of other mental and behavioral disorders: Secondary | ICD-10-CM

## 2019-07-12 DIAGNOSIS — Z7689 Persons encountering health services in other specified circumstances: Secondary | ICD-10-CM

## 2019-07-12 MED ORDER — GABAPENTIN 300 MG PO CAPS: 300.0000 mg | ORAL_CAPSULE | Freq: Three times a day (TID) | ORAL | 1 refills | Status: DC
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
## 2019-07-18 ENCOUNTER — Other Ambulatory Visit: Payer: Self-pay

## 2019-07-18 ENCOUNTER — Ambulatory Visit: Payer: Self-pay | Admitting: Pharmacy Technician

## 2019-07-18 DIAGNOSIS — Z79899 Other long term (current) drug therapy: Secondary | ICD-10-CM

## 2019-07-18 NOTE — Progress Notes (Signed)
Completed [facility] application and contract.  Patient agreed to all terms of the [facility] contract.    Patient approved to receive medication assistance at [facility] until time for re-certification in 2022, and as long as eligibility criteria continues to be met.    Provided patient with community resource material based on his particular needs.    Ivee Poellnitz J. Vannie Hilgert Care Manager [facility]  

## 2019-07-19 ENCOUNTER — Other Ambulatory Visit: Payer: Self-pay

## 2019-07-19 ENCOUNTER — Ambulatory Visit: Payer: Self-pay | Admitting: Licensed Clinical Social Worker

## 2019-07-19 DIAGNOSIS — Z8659 Personal history of other mental and behavioral disorders: Secondary | ICD-10-CM

## 2019-07-19 DIAGNOSIS — F1010 Alcohol abuse, uncomplicated: Secondary | ICD-10-CM
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
## 2019-07-28 ENCOUNTER — Telehealth: Payer: Self-pay | Admitting: Licensed Clinical Social Worker

## 2019-07-28 NOTE — Telephone Encounter (Signed)
Clinician reached out to the patient that to discuss the case consultation with Dr. Mare Ferrari, MD, psychiatric consultant on Tuesday June 1st @ 9 am. She explained to the patient that due to the psychotropic medications he was prescribed in the past and history of substance abuse they feel that he needs a higher level of care that [facility] is not equipped to provide for him at this point in time. He was referred to [facility] and Substance Abuse [facility].

## 2019-08-04 ENCOUNTER — Emergency Department: Payer: Self-pay

## 2019-08-04 ENCOUNTER — Encounter: Payer: Self-pay | Admitting: Emergency Medicine

## 2019-08-04 ENCOUNTER — Emergency Department
Admission: EM | Admit: 2019-08-04 | Discharge: 2019-08-04 | Disposition: A | Discharge status: Home / Self Care | Payer: Self-pay | Attending: Emergency Medicine | Admitting: Emergency Medicine

## 2019-08-04 ENCOUNTER — Other Ambulatory Visit: Payer: Self-pay

## 2019-08-04 DIAGNOSIS — F17210 Nicotine dependence, cigarettes, uncomplicated: Secondary | ICD-10-CM | POA: Insufficient documentation

## 2019-08-04 DIAGNOSIS — I10 Essential (primary) hypertension: Secondary | ICD-10-CM | POA: Insufficient documentation

## 2019-08-04 DIAGNOSIS — R0789 Other chest pain: Secondary | ICD-10-CM

## 2019-08-04 DIAGNOSIS — Z79899 Other long term (current) drug therapy: Secondary | ICD-10-CM | POA: Insufficient documentation

## 2019-08-04 DIAGNOSIS — M545 Low back pain, unspecified: Secondary | ICD-10-CM

## 2019-08-04 DIAGNOSIS — R071 Chest pain on breathing: Secondary | ICD-10-CM | POA: Insufficient documentation

## 2019-08-04 LAB — CBC
HCT: 46.6 % (ref 39.0–52.0)
Hemoglobin: 15.8 g/dL (ref 13.0–17.0)
MCH: 30.2 pg (ref 26.0–34.0)
MCHC: 33.9 g/dL (ref 30.0–36.0)
MCV: 88.9 fL (ref 80.0–100.0)
Platelets: 280 10*3/uL (ref 150–400)
RBC: 5.24 MIL/uL (ref 4.22–5.81)
RDW: 13.4 % (ref 11.5–15.5)
WBC: 9.1 10*3/uL (ref 4.0–10.5)
nRBC: 0 % (ref 0.0–0.2)

## 2019-08-04 LAB — BASIC METABOLIC PANEL
Anion gap: 11 (ref 5–15)
BUN: 6 mg/dL (ref 6–20)
CO2: 26 mmol/L (ref 22–32)
Calcium: 9 mg/dL (ref 8.9–10.3)
Chloride: 102 mmol/L (ref 98–111)
Creatinine, Ser: 0.7 mg/dL (ref 0.61–1.24)
GFR calc Af Amer: >60 mL/min (ref >60)
GFR calc non Af Amer: >60 mL/min (ref >60)
Glucose, Bld: 110 mg/dL — ABNORMAL HIGH (ref 70–99)
Potassium: 3.3 mmol/L — ABNORMAL LOW (ref 3.5–5.1)
Sodium: 139 mmol/L (ref 135–145)

## 2019-08-04 LAB — FIBRIN DERIVATIVES D-DIMER (ARMC ONLY): Fibrin derivatives D-dimer (ARMC): 931.54 ng/mL (FEU) — ABNORMAL HIGH (ref 0.00–499.00)

## 2019-08-04 LAB — TROPONIN I (HIGH SENSITIVITY)
Troponin I (High Sensitivity): 3 ng/L (ref <18)
Troponin I (High Sensitivity): 3 ng/L (ref <18)

## 2019-08-04 MED ORDER — KETOROLAC TROMETHAMINE 30 MG/ML IJ SOLN
30.0000 mg | Freq: Once | INTRAMUSCULAR | Status: AC
  Administered 2019-08-04: 30 mg via INTRAVENOUS
  Filled 2019-08-04: qty 1

## 2019-08-04 MED ORDER — IOHEXOL 350 MG/ML SOLN
75.0000 mL | Freq: Once | INTRAVENOUS | Status: AC | PRN
  Administered 2019-08-04: 75 mL via INTRAVENOUS

## 2019-08-04 MED ORDER — KETOROLAC TROMETHAMINE 10 MG PO TABS: 10.0000 mg | ORAL_TABLET | Freq: Four times a day (QID) | ORAL | 0 refills | Status: DC | PRN

## 2019-08-04 NOTE — ED Triage Notes (Signed)
Pt BIB EMS with c/o fall yesterday and now having chest pain.  VSS, EKG SR

## 2019-08-04 NOTE — Discharge Instructions (Addendum)
Use the Toradol 1 pill by mouth 4 times a day for the next few days.  Please return if you are worse or no better in the next couple days or follow-up with your doctor.  Make sure you take the Toradol with food because it can upset your stomach.

## 2019-08-04 NOTE — ED Provider Notes (Signed)
[facility] Emergency Department Provider Note   ____________________________________________   First MD Initiated Contact with Patient 08/04/19 [phone]     (approximate)  I have reviewed the triage vital signs and the nursing notes.   HISTORY  Chief Complaint Chest Pain and Back Pain    HPI John [name] is a 39 y.o. male patient reports he fell last night and has pain in his low back especially on the right side.  There is no radiation.  He also says he woke up this morning and has pain in left upper chest hurts when he breathes and when he palpated.  He did not fall and hit himself on the chest.  He is a little bit short of breath because of the pain.  Pain appears to be moderate.  Achy and the chest pain of course is pleuritic in nature.        Past Medical History:  Diagnosis Date  . ASD (atrial septal defect)   . Bacterial infection due to H. pylori   . Hypertension   . Kidney stones     Patient Active Problem List   Diagnosis Date Noted  . Encounter to establish care 07/12/2019  . History of depression 07/12/2019  . Smoking 07/12/2019  . Depressive disorder 06/14/2019  . Sepsis (HCC) 04/30/2019  . Fall 04/30/2019  . Left leg pain 04/30/2019  . Orthopedic hardware present 04/30/2019  . S/P cystoscopy 04/20/19 [facility]. Hx urethroplasty for urethral trauma in 2020 04/30/2019  . Alcohol abuse with intoxication (HCC) 04/30/2019  . Acute metabolic encephalopathy 04/30/2019  . Hypoxia 04/30/2019  . Acute Femoral condyle fracture (HCC) 04/30/2019  . Severe sepsis (HCC) 04/30/2019  . Alcohol abuse 01/14/2018  . Substance induced mood disorder (HCC) 01/14/2018    Past Surgical History:  Procedure Laterality Date  . ASD REPAIR    . FOOT SURGERY Right   . KNEE SURGERY Right     Prior to Admission medications   Medication Sig Start Date End Date Taking? Authorizing Provider  divalproex (DEPAKOTE) 500 MG DR tablet Take 1 tablet (500 mg total)  by mouth 2 (two) times daily. 06/15/19 07/15/19  Shaune Pollack, MD  gabapentin (NEURONTIN) 300 MG capsule Take 1 capsule (300 mg total) by mouth 3 (three) times daily. 07/12/19   Iloabachie, Chioma E, NP  ketorolac (TORADOL) 10 MG tablet Take 1 tablet (10 mg total) by mouth every 6 (six) hours as needed. 08/04/19   Arnaldo Natal, MD    Allergies Penicillins  No family history on file.  Social History Social History   Tobacco Use  . Smoking status: Current Every Day Smoker    Packs/day: 0.50    Types: Cigarettes  . Smokeless tobacco: Never Used  Vaping Use  . Vaping Use: Never used  Substance Use Topics  . 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 Drug use: Not Currently    Review of Systems  Constitutional: No fever/chills Eyes: No visual changes. ENT: No sore throat. Cardiovascular: chest pain. Respiratory: Some shortness of breath. Gastrointestinal: No abdominal pain.  No nausea, no vomiting.  No diarrhea.  No constipation. Genitourinary: Negative for dysuria. Musculoskeletal:  back pain. Skin: Negative for rash. Neurological: Negative for headaches, focal weakness   ____________________________________________   PHYSICAL EXAM:  VITAL SIGNS: ED Triage Vitals [08/04/19 0808]  Enc Vitals Group     BP  Pulse      Resp      Temp      Temp src      SpO2      Weight 165 lb (74.8 kg)     Height 5\' 9"  (1.753 m)     Head Circumference      Peak Flow      Pain Score 10     Pain Loc      Pain Edu?      Excl. in GC?     Constitutional: Alert and oriented looks somewhat uncomfortable Eyes: Conjunctivae are normal. PER. EOMI. Head: Atraumatic. Nose: No congestion/rhinnorhea. Mouth/Throat: Mucous membranes are moist.  Cardiovascular: Normal rate, regular rhythm. Grossly normal heart sounds.  Good peripheral circulation. Respiratory: Normal respiratory effort.  No retractions.  Lungs CTAB.  There is some tenderness on palpation left upper chest Gastrointestinal: Soft and nontender. No distention. No abdominal bruits. No CVA tenderness. Musculoskeletal: No lower extremity tenderness nor edema there is some pain on palpation of the lower L-spine in the paraspinous muscles on the right side Neurologic:  Normal speech and language. No gross focal neurologic deficits are appreciated.  Skin:  Skin is warm, dry and intact. No rash noted.   ____________________________________________   LABS (all labs ordered are listed, but only abnormal results are displayed)  Labs Reviewed  BASIC METABOLIC PANEL - Abnormal; Notable for the following components:      Result Value   Potassium 3.3 (*)    Glucose, Bld 110 (*)    All other components within normal limits  FIBRIN DERIVATIVES D-DIMER ([facility] ONLY) - Abnormal; Notable for the following components:   Fibrin derivatives D-dimer ([facility]) 931.54 (*)    All other components within normal limits  CBC  TROPONIN I (HIGH SENSITIVITY)  TROPONIN I (HIGH SENSITIVITY)   ____________________________________________  EKG EKG read interpreted by me shows normal sinus rhythm rate of 94 Axis appears normal no acute ST-T changes  ____________________________________________  RADIOLOGY  ED MD interpretation: Chest x-ray L-spine and CT chest read by radiology reviewed by me show no acute pathology  Official radiology report(s): DG Chest 2 View  Result Date: 08/04/2019 CLINICAL DATA:  Chest pain.  Recent fall EXAM: CHEST - 2 VIEW COMPARISON:  June 12, 2019 FINDINGS: There is apparent scarring in the left base region, stable. There is no edema or airspace opacity. Heart size and pulmonary vascularity normal. No adenopathy. There is postoperative change in the upper lumbar region. IMPRESSION: Stable scarring left base region. No new opacity. Cardiac silhouette within normal limits. Electronically Signed   By: June 14, 2019 III M.D.    On: 08/04/2019 09:10   DG Lumbar Spine Complete  Result Date: 08/04/2019 CLINICAL DATA:  10/04/2019 last night, low back pain EXAM: LUMBAR SPINE - COMPLETE 4+ VIEW COMPARISON:  06/14/2019 FINDINGS: Five non-rib-bearing lumbar vertebra. Prior posterior fusion T11-L1 with intact pedicle screws and posterior bars. Prior LEFT SI joint fusion. Chronic compression fracture of T12 vertebral body unchanged. Lumbar vertebral body and disc space heights maintained. No acute fracture, subluxation, or bone destruction. Visualized bowel gas pattern normal. IMPRESSION: Prior fusion of T11-L1 and LEFT SI joint. Stable old superior endplate compression fracture T12. Osseous demineralization without acute bony abnormalities. Electronically Signed   By: 06/16/2019 M.D.   On: 08/04/2019 10:48   CT Angio Chest PE W and/or Wo Contrast  Result Date: 08/04/2019 CLINICAL DATA:  Shortness of breath, pleuritic chest pain. Fall last night. EXAM: CT ANGIOGRAPHY CHEST WITH CONTRAST  TECHNIQUE: Multidetector CT imaging of the chest was performed using the standard protocol during bolus administration of intravenous contrast. Multiplanar CT image reconstructions and MIPs were obtained to evaluate the vascular anatomy. CONTRAST:  50mL OMNIPAQUE IOHEXOL 350 MG/ML SOLN COMPARISON:  04/30/2019 FINDINGS: Cardiovascular: No filling defects in the pulmonary arteries to suggest pulmonary emboli. Heart is normal size. Aorta is normal caliber. Mediastinum/Nodes: No mediastinal, hilar, or axillary adenopathy. Lungs/Pleura: Linear areas of scarring or atelectasis in the lungs bilaterally. No effusions. Upper Abdomen: Imaging into the upper abdomen shows no acute findings. Musculoskeletal: Chest wall soft tissues are unremarkable. No acute bony abnormality. Review of the MIP images confirms the above findings. IMPRESSION: No evidence of pulmonary embolus. Linear areas of atelectasis or scarring in the lungs bilaterally, similar to prior study.  Electronically Signed   By: Rolm Baptise M.D.   On: 08/04/2019 11:58    ____________________________________________   PROCEDURES  Procedure(s) performed (including Critical Care):  Procedures   ____________________________________________   INITIAL IMPRESSION / ASSESSMENT AND PLAN / ED COURSE  Patient with a fall low back pain no numbness or weakness.  Old compression fracture on the x-ray.  He had pleuritic chest pain with some shortness of breath but no rib fractures positive D-dimer CT of the chest showed no acute pathology.  Apparently he just has some bruises and muscle strain.  I gave him some Toradol will give him some more Toradol to go home with for chest 3 days so as not to worsen any possible medical conditions.  He will take this with food.             ____________________________________________   FINAL CLINICAL IMPRESSION(S) / ED DIAGNOSES  Final diagnoses:  Right-sided low back pain without sciatica, unspecified chronicity  Chest wall pain     ED Discharge Orders         Ordered    ketorolac (TORADOL) 10 MG tablet  Every 6 hours PRN     Discontinue  Reprint     08/04/19 1224           Note:  This document was prepared using Dragon voice recognition software and may include unintentional dictation errors.    Nena Polio, MD 08/04/19 1229

## 2019-08-04 NOTE — ED Notes (Signed)
Pt visualized ambulatory with slight limp from 5H to the bathroom then to 1H to await SW. Pt states to this RN, "where did he send my medicine?" This RN explained that EDP sent meds to medication management, pt states "oh no I can't go over there", this RN explained EDP could possibly write hard copy of prescriptions. Pt states "when I get discharged do I just go wait in the lobby?" This RN explained after discharge patient would go wait in the lobby and IV would be removed. Pt now stating he is unsure if he can wait for SW due to unknown ETA of SW. Pt ambulatory to chair in 1H with personal belongings. Pt visualized in NAD, VSS. EDP aware of patient moving from [facility] to 1H.

## 2019-08-04 NOTE — ED Notes (Signed)
Patient moved to 1 hall to sit in a chair while waiting for SW to see him.  Patient ambulatory.  Gait steady.  NAD

## 2019-08-04 NOTE — TOC Transition Note (Signed)
Transition of Care [facility]) - CM/SW Discharge Note   Patient Details  Name: John [name] MRN: [id number] Date of Birth: January 17, 1981  [facility]) CM/SW Contact:  Dean Cellar, RN Phone Number: 08/04/2019, 4:10 PM   Clinical Narrative:    Patient given taxi voucher for discharge back to [facility].      Barriers to Discharge: ED No Barriers   Patient Goals and CMS Choice Patient states their goals for this hospitalization and ongoing recovery are:: placement in shelter      Discharge Placement                       Discharge Plan and Services                                     Social Determinants of Health (SDOH) Interventions     Readmission Risk Interventions No flowsheet data found.

## 2019-08-04 NOTE — ED Triage Notes (Signed)
Patient reports he had a mechanical fall yesterday. Now complaining of pain in low back.   Also complaining of chest pain and shortness of breath that started this morning.

## 2019-08-04 NOTE — ED Notes (Signed)
This RN to bedside to attempt to D/C patient, pt awakens from sleep to this RN verbal stimuli. Pt requesting to speak to EDP, this RN had EDP come to bedside to speak with patient. EDP confirmed with patient that nothing was broken, just bruised. Pt repeatedly stating that he would like to be admitted due to pain. Pt states that he cannot walk at this time, requesting to speak with social work, states "I can't walk with this bruising". Charge RN made aware.
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
## 2019-08-04 NOTE — ED Provider Notes (Signed)
Patient lying in bed crossing and crossing his legs with no problem.  He says he hurts too much to walk.  He did not appear to have any leg weakness when I examine him.  He wants to talk to Child psychotherapist.  He wants to see if he can find a place to stay.   Arnaldo Natal, MD 08/04/19 1346

## 2019-08-10 ENCOUNTER — Other Ambulatory Visit: Payer: Self-pay

## 2019-08-10 ENCOUNTER — Ambulatory Visit: Payer: Self-pay | Admitting: Gerontology

## 2019-08-10 ENCOUNTER — Encounter: Payer: Self-pay | Admitting: Gerontology

## 2019-08-10 VITALS — BP 102/69 | HR 86 | Ht 69.0 in | Wt 168.0 lb

## 2019-08-10 DIAGNOSIS — M549 Dorsalgia, unspecified: Secondary | ICD-10-CM | POA: Insufficient documentation

## 2019-08-10 DIAGNOSIS — Z8659 Personal history of other mental and behavioral disorders: Secondary | ICD-10-CM

## 2019-08-10 DIAGNOSIS — Z978 Presence of other specified devices: Secondary | ICD-10-CM

## 2019-08-10 DIAGNOSIS — M79605 Pain in left leg: Secondary | ICD-10-CM

## 2019-08-10 DIAGNOSIS — G8929 Other chronic pain: Secondary | ICD-10-CM | POA: Insufficient documentation

## 2019-08-10 MED ORDER — GABAPENTIN 300 MG PO CAPS: 300.0000 mg | ORAL_CAPSULE | Freq: Three times a day (TID) | ORAL | 2 refills | Status: DC

## 2019-08-10 NOTE — Progress Notes (Signed)
Established Patient Office Visit  Subjective:  Patient ID: John [name], male    DOB: 09-22-1980  Age: 39 y.o. MRN: [id number]  CC:  Chief Complaint  Patient presents with  . Back Pain    HPI [name] presents for follow up of lower back pain and medication refill. He states that he continues to experience constant non radiating sharp 6/10 pain to his left lower back. He states that pain started yesterday after lifting a couch at the store. He states that lying down and taking Gabapentin relieve symptoms, but he was out of gabapentin. He denies bowel or bladder incontinence and saddle anesthesia. He was seen at the ED on 08/04/2019 and was treated with10 mg Toradol every 6 hours for back pain which he has not picked up from the Pharmacy. He also has a history of depression and states that he stopped taking Depakote and has not contacted [facility]. He states that his mood is good , denies suicidal nor homicidal ideation. He requests a work note and overall, he states that he's doing well and offers no further complaint.  Past Medical History:  Diagnosis Date  . ASD (atrial septal defect)   . Bacterial infection due to H. pylori   . Hypertension   . Kidney stones     Past Surgical History:  Procedure Laterality Date  . ASD REPAIR    . FOOT SURGERY Right   . KNEE SURGERY Right     No family history on file.  Social History   Socioeconomic History  . Marital status: Single    Spouse name: Not on file  . Number of children: Not on file  . Years of education: Not on file  . Highest education level: Not on file  Occupational History  . Not on file  Tobacco Use  . Smoking status: Current Every Day Smoker    Types: Cigarettes  . Smokeless tobacco: Never Used  . Tobacco comment: down to 2 cigarettes a day  Vaping Use  . Vaping Use: Never used  Substance and Sexual Activity  . Alcohol use: Not Currently    Comment: Last consumed Friday 06/10/19, pt states he is "an alcoholic"  and "wants to drink"  . Drug use: Not Currently  . Sexual activity: Yes    Birth control/protection: None  Other Topics Concern  . Not on file  Social History Narrative  . Not on file   Social Determinants of Health   Financial Resource Strain: High Risk  . Difficulty of Paying Living Expenses: Very hard  Food Insecurity: No Food Insecurity  . Worried About Charity fundraiser in the Last Year: Never true  . Ran Out of Food in the Last Year: Never true  Transportation Needs: Unmet Transportation Needs  . Lack of Transportation (Medical): Yes  . Lack of Transportation (Non-Medical): Yes  Physical Activity:   . Days of Exercise per Week:   . Minutes of Exercise per Session:   Stress:   . Feeling of Stress :   Social Connections: Moderately Isolated  . Frequency of Communication with Friends and Family: Never  . Frequency of Social Gatherings with Friends and Family: Never  . Attends Religious Services: 1 to 4 times per year  . Active Member of Clubs or Organizations: Yes  . Attends Archivist Meetings: 1 to 4 times per year  . Marital Status: Never married  Intimate Partner Violence: Not At Risk  . Fear of Current or Ex-Partner:  No  . Emotionally Abused: No  . Physically Abused: No  . Sexually Abused: No    Outpatient Medications Prior to Visit  Medication Sig Dispense Refill  . ketorolac (TORADOL) 10 MG tablet Take 1 tablet (10 mg total) by mouth every 6 (six) hours as needed. 12 tablet 0  . gabapentin (NEURONTIN) 300 MG capsule Take 1 capsule (300 mg total) by mouth 3 (three) times daily. 90 capsule 1  . divalproex (DEPAKOTE) 500 MG DR tablet Take 1 tablet (500 mg total) by mouth 2 (two) times daily. (Patient not taking: Reported on 08/10/2019) 60 tablet 0   No facility-administered medications prior to visit.    Allergies  Allergen Reactions  . Penicillins     Did it involve swelling of the face/tongue/throat, SOB, or low BP? No Did it involve sudden or  severe rash/hives, skin peeling, or any reaction on the inside of your mouth or nose? No Did you need to seek medical attention at a hospital or doctor's office? Unknown When did it last happen? If all above answers are "NO", may proceed with cephalosporin use.    ROS Review of Systems  Constitutional: Negative.   Respiratory: Negative.   Cardiovascular: Negative.   Musculoskeletal: Positive for back pain.  Neurological: Negative.       Objective:    Physical Exam Constitutional:      Appearance: Normal appearance.  HENT:     Head: Normocephalic and atraumatic.  Cardiovascular:     Rate and Rhythm: Normal rate and regular rhythm.     Pulses: Normal pulses.     Heart sounds: Normal heart sounds.  Pulmonary:     Effort: Pulmonary effort is normal.     Breath sounds: Normal breath sounds.  Musculoskeletal:        General: Tenderness (to left lower back with palpation) present.  Neurological:     General: No focal deficit present.     Mental Status: He is alert and oriented to person, place, and time. Mental status is at baseline.  Psychiatric:        Mood and Affect: Mood normal.        Behavior: Behavior normal.        Thought Content: Thought content normal.        Judgment: Judgment normal.     BP 102/69 (BP Location: Right Arm, Patient Position: Sitting)   Pulse 86   Ht 5\' 9"  (1.753 m)   Wt 168 lb (76.2 kg)   SpO2 96%   BMI 24.81 kg/m  Wt Readings from Last 3 Encounters:  08/10/19 168 lb (76.2 kg)  08/04/19 165 lb (74.8 kg)  07/12/19 176 lb (79.8 kg)     Health Maintenance Due  Topic Date Due  . Hepatitis C Screening  Never done  . COVID-19 Vaccine (1) Never done  . TETANUS/TDAP  Never done    There are no preventive care reminders to display for this patient.  No results found for: TSH Lab Results  Component Value Date   WBC 9.1 08/04/2019   HGB 15.8 08/04/2019   HCT 46.6 08/04/2019   MCV 88.9 08/04/2019   PLT 280 08/04/2019   Lab  Results  Component Value Date   NA 139 08/04/2019   K 3.3 (L) 08/04/2019   CO2 26 08/04/2019   GLUCOSE 110 (H) 08/04/2019   BUN 6 08/04/2019   CREATININE 0.70 08/04/2019   BILITOT 0.5 06/15/2019   ALKPHOS 79 06/15/2019   AST 14 (L) 06/15/2019  ALT 10 06/15/2019   PROT 8.4 (H) 06/15/2019   ALBUMIN 4.2 06/15/2019   CALCIUM 9.0 08/04/2019   ANIONGAP 11 08/04/2019   No results found for: CHOL No results found for: HDL No results found for: LDLCALC No results found for: TRIG No results found for: CHOLHDL No results found for: QMGQ6P    Assessment & Plan:    1. Left leg pain - He pain is under control and he will continue on current treatment regimen. - gabapentin (NEURONTIN) 300 MG capsule; Take 1 capsule (300 mg total) by mouth 3 (three) times daily.  Dispense: 90 capsule; Refill: 2  2. Orthopedic hardware present - He pain is under control and he will continue on current treatment regimen. - gabapentin (NEURONTIN) 300 MG capsule; Take 1 capsule (300 mg total) by mouth 3 (three) times daily.  Dispense: 90 capsule; Refill: 2  3. History of depression - He was advised to call and schedule an appointment with [facility] and to call the Crisis help line with worsening symptoms.  4. Chronic left-sided low back pain without sciatica - Lower back pain probably due to sprain from lifting a couch. He was advised to apply ice and heat for 15 minutes, provided with a work note to rest for 2 days and no lifting of more than 15 pounds. He will continue on gabapentin and notify clinic for worsening symptoms. - gabapentin (NEURONTIN) 300 MG capsule; Take 1 capsule (300 mg total) by mouth 3 (three) times daily.  Dispense: 90 capsule; Refill: 2    Follow-up: Return in about 29 days (around 09/08/2019), or if symptoms worsen or fail to improve.    Itzayana Pardy Trellis Paganini, NP

## 2019-08-10 NOTE — Patient Instructions (Signed)

## 2019-08-29 ENCOUNTER — Emergency Department
Admission: EM | Admit: 2019-08-29 | Discharge: 2019-09-01 | Disposition: A | Discharge status: Home / Self Care | Payer: Self-pay | Attending: Emergency Medicine | Admitting: Emergency Medicine

## 2019-08-29 ENCOUNTER — Other Ambulatory Visit: Payer: Self-pay

## 2019-08-29 DIAGNOSIS — IMO0001 Reserved for inherently not codable concepts without codable children: Secondary | ICD-10-CM | POA: Diagnosis present

## 2019-08-29 DIAGNOSIS — F1010 Alcohol abuse, uncomplicated: Secondary | ICD-10-CM | POA: Diagnosis present

## 2019-08-29 DIAGNOSIS — W19XXXA Unspecified fall, initial encounter: Secondary | ICD-10-CM | POA: Diagnosis present

## 2019-08-29 DIAGNOSIS — F1994 Other psychoactive substance use, unspecified with psychoactive substance-induced mood disorder: Secondary | ICD-10-CM | POA: Diagnosis present

## 2019-08-29 DIAGNOSIS — N189 Chronic kidney disease, unspecified: Secondary | ICD-10-CM | POA: Insufficient documentation

## 2019-08-29 DIAGNOSIS — F10920 Alcohol use, unspecified with intoxication, uncomplicated: Secondary | ICD-10-CM

## 2019-08-29 DIAGNOSIS — F331 Major depressive disorder, recurrent, moderate: Secondary | ICD-10-CM

## 2019-08-29 DIAGNOSIS — Y9389 Activity, other specified: Secondary | ICD-10-CM | POA: Insufficient documentation

## 2019-08-29 DIAGNOSIS — W260XXA Contact with knife, initial encounter: Secondary | ICD-10-CM | POA: Insufficient documentation

## 2019-08-29 DIAGNOSIS — S60812A Abrasion of left wrist, initial encounter: Secondary | ICD-10-CM | POA: Insufficient documentation

## 2019-08-29 DIAGNOSIS — Y999 Unspecified external cause status: Secondary | ICD-10-CM | POA: Insufficient documentation

## 2019-08-29 DIAGNOSIS — F17210 Nicotine dependence, cigarettes, uncomplicated: Secondary | ICD-10-CM | POA: Insufficient documentation

## 2019-08-29 DIAGNOSIS — F10129 Alcohol abuse with intoxication, unspecified: Secondary | ICD-10-CM | POA: Diagnosis present

## 2019-08-29 DIAGNOSIS — R45851 Suicidal ideations: Secondary | ICD-10-CM | POA: Insufficient documentation

## 2019-08-29 DIAGNOSIS — F17200 Nicotine dependence, unspecified, uncomplicated: Secondary | ICD-10-CM | POA: Diagnosis present

## 2019-08-29 DIAGNOSIS — F329 Major depressive disorder, single episode, unspecified: Secondary | ICD-10-CM | POA: Insufficient documentation

## 2019-08-29 DIAGNOSIS — F32A Depression, unspecified: Secondary | ICD-10-CM | POA: Diagnosis present

## 2019-08-29 DIAGNOSIS — Z20822 Contact with and (suspected) exposure to covid-19: Secondary | ICD-10-CM | POA: Insufficient documentation

## 2019-08-29 DIAGNOSIS — Z8659 Personal history of other mental and behavioral disorders: Secondary | ICD-10-CM

## 2019-08-29 DIAGNOSIS — I129 Hypertensive chronic kidney disease with stage 1 through stage 4 chronic kidney disease, or unspecified chronic kidney disease: Secondary | ICD-10-CM | POA: Insufficient documentation

## 2019-08-29 DIAGNOSIS — Y92009 Unspecified place in unspecified non-institutional (private) residence as the place of occurrence of the external cause: Secondary | ICD-10-CM | POA: Insufficient documentation

## 2019-08-29 LAB — CBC
HCT: 45.6 % (ref 39.0–52.0)
Hemoglobin: 15.7 g/dL (ref 13.0–17.0)
MCH: 30.1 pg (ref 26.0–34.0)
MCHC: 34.4 g/dL (ref 30.0–36.0)
MCV: 87.5 fL (ref 80.0–100.0)
Platelets: 340 10*3/uL (ref 150–400)
RBC: 5.21 MIL/uL (ref 4.22–5.81)
RDW: 13.3 % (ref 11.5–15.5)
WBC: 8.5 10*3/uL (ref 4.0–10.5)
nRBC: 0 % (ref 0.0–0.2)

## 2019-08-29 MED ORDER — LORAZEPAM 2 MG PO TABS: 0.0000 mg | ORAL_TABLET | Freq: Four times a day (QID) | ORAL | Status: AC

## 2019-08-29 MED ORDER — THIAMINE HCL 100 MG PO TABS
100.0000 mg | ORAL_TABLET | Freq: Every day | ORAL | Status: DC
  Administered 2019-08-30 – 2019-09-01 (×3): 100 mg via ORAL
  Filled 2019-08-29 (×3): qty 1

## 2019-08-29 NOTE — ED Triage Notes (Signed)
Pt arrives to ED via [facility] voluntarily for ETOH detox and SI.

## 2019-08-29 NOTE — ED Provider Notes (Signed)
[facility] Emergency Department Provider Note   ____________________________________________   None    (approximate)  I have reviewed the triage vital signs and the nursing notes.   HISTORY  Chief Complaint Psychiatric Evaluation    HPI John [name] is a 39 y.o. male who presents to the ED from home voluntarily seeking alcohol detox and voicing suicidal ideation.  States he tried to cut his wrist earlier tonight with a dirty knife.  Tetanus is not up-to-date.  Denies active HI/AH/VH.  Otherwise voices no medical complaints.      Past Medical History:  Diagnosis Date  . ASD (atrial septal defect)   . Bacterial infection due to H. pylori   . Hypertension   . Kidney stones     Patient Active Problem List   Diagnosis Date Noted  . Chronic back pain 08/10/2019  . Encounter to establish care 07/12/2019  . History of depression 07/12/2019  . Smoking 07/12/2019  . Depressive disorder 06/14/2019  . Sepsis (HCC) 04/30/2019  . Fall 04/30/2019  . Left leg pain 04/30/2019  . Orthopedic hardware present 04/30/2019  . S/P cystoscopy 04/20/19 [facility]. Hx urethroplasty for urethral trauma in 2020 04/30/2019  . Alcohol abuse with intoxication (HCC) 04/30/2019  . Acute metabolic encephalopathy 04/30/2019  . Hypoxia 04/30/2019  . Acute Femoral condyle fracture (HCC) 04/30/2019  . Severe sepsis (HCC) 04/30/2019  . Alcohol abuse 01/14/2018  . Substance induced mood disorder (HCC) 01/14/2018    Past Surgical History:  Procedure Laterality Date  . ASD REPAIR    . FOOT SURGERY Right   . KNEE SURGERY Right     Prior to Admission medications   Medication Sig Start Date End Date Taking? Authorizing Provider  divalproex (DEPAKOTE) 500 MG DR tablet Take 1 tablet (500 mg total) by mouth 2 (two) times daily. Patient not taking: Reported on 08/10/2019 06/15/19 07/15/19  Shaune Pollack, MD  gabapentin (NEURONTIN) 300 MG capsule Take 1 capsule (300 mg total) by mouth  3 (three) times daily. 08/10/19   Iloabachie, Chioma E, NP  ketorolac (TORADOL) 10 MG tablet Take 1 tablet (10 mg total) by mouth every 6 (six) hours as needed. 08/04/19   Arnaldo Natal, MD    Allergies Penicillins  No family history on file.  Social History Social History   Tobacco Use  . Smoking status: Current Every Day Smoker    Types: Cigarettes  . Smokeless tobacco: Never Used  . Tobacco comment: down to 2 cigarettes a day  Vaping Use  . Vaping Use: Never used  Substance Use Topics  . 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 Drug use: Not Currently    Review of Systems  Constitutional: No fever/chills Eyes: No visual changes. ENT: No sore throat. Cardiovascular: Denies chest pain. Respiratory: Denies shortness of breath. Gastrointestinal: No abdominal pain.  No nausea, no vomiting.  No diarrhea.  No constipation. Genitourinary: Negative for dysuria. Musculoskeletal: Negative for back pain. Skin: Negative for rash. Neurological: Negative for headaches, focal weakness or numbness. Psychiatric:  Positive for depression with SI.  ____________________________________________   PHYSICAL EXAM:  VITAL SIGNS: ED Triage Vitals  Enc Vitals Group     BP 08/29/19 2333 110/72     Pulse Rate 08/29/19 2333 (!) 105     Resp 08/29/19 2333 18     Temp 08/29/19 2333 98.4 F (36.9 C)  Temp Source 08/29/19 2333 Oral     SpO2 08/29/19 2333 95 %     Weight 08/29/19 2331 171 lb (77.6 kg)     Height 08/29/19 2331 5\' 10"  (1.778 m)     Head Circumference --      Peak Flow --      Pain Score 08/29/19 2331 10     Pain Loc --      Pain Edu? --      Excl. in GC? --     Constitutional: Alert and oriented.  Disheveled appearing and in no acute distress. Eyes: Conjunctivae are normal. PERRL. EOMI. Head: Atraumatic. Nose: No congestion/rhinnorhea. Mouth/Throat: Mucous membranes are moist.    Neck: No stridor.   Cardiovascular: Normal rate, regular rhythm. Grossly normal heart sounds.  Good peripheral circulation. Respiratory: Normal respiratory effort.  No retractions. Lungs CTAB. Gastrointestinal: Soft and nontender. No distention. No abdominal bruits. No CVA tenderness. Musculoskeletal: Approximately 1 cm superficial abrasion to left wrist without active bleeding or gaping skin.  2+ radial pulse.  Brisk, less than 5-second capillary refill.  No lower extremity tenderness nor edema.  No joint effusions. Neurologic:  Normal speech and language. No gross focal neurologic deficits are appreciated. No gait instability. Skin:  Skin is warm, dry and intact. No rash noted. Psychiatric: Mood and affect are normal. Speech and behavior are normal.  ____________________________________________   LABS (all labs ordered are listed, but only abnormal results are displayed)  Labs Reviewed  ETHANOL - Abnormal; Notable for the following components:      Result Value   Alcohol, Ethyl (B) 225 (*)    All other components within normal limits  CBC  COMPREHENSIVE METABOLIC PANEL  URINE DRUG SCREEN, QUALITATIVE ([facility] ONLY)   ____________________________________________  EKG  None ____________________________________________  RADIOLOGY  ED MD interpretation: None  Official radiology report(s): No results found.  ____________________________________________   PROCEDURES  Procedure(s) performed (including Critical Care):  Procedures   ____________________________________________   INITIAL IMPRESSION / ASSESSMENT AND PLAN / ED COURSE  As part of my medical decision making, I reviewed the following data within the electronic MEDICAL RECORD NUMBER Nursing notes reviewed and incorporated, Labs reviewed, Old chart reviewed, A consult was requested and obtained from this/these consultant(s) Psychiatry and Notes from prior ED visits     John [name] was evaluated in Emergency  Department on 08/30/2019 for the symptoms described in the history of present illness. He was evaluated in the context of the global COVID-19 pandemic, which necessitated consideration that the patient might be at risk for infection with the SARS-CoV-2 virus that causes COVID-19. Institutional protocols and algorithms that pertain to the evaluation of patients at risk for COVID-19 are in a state of rapid change based on information released by regulatory bodies including the CDC and federal and state organizations. These policies and algorithms were followed during the patient's care in the ED.    39 year old male who presents voluntarily for alcohol detox and SI.  Attempted to cut wrist with a dirty knife.  Will update tetanus.  Patient contracts for safety while in the ED.  Will consult psychiatry to evaluate.   Clinical Course as of Aug 29 625  Tue Aug 30, 2019  0135 Patient has been evaluated by psychiatric NP who recommends reassessment in the am. The patient has been placed in psychiatric observation due to the need to provide a safe environment for the patient while obtaining psychiatric consultation and evaluation, as well as ongoing medical and medication  management to treat the patient's condition. The patient has not been placed under full IVC at this time.      [JS]    Clinical Course User Index [JS] Irean Hong, MD     ____________________________________________   FINAL CLINICAL IMPRESSION(S) / ED DIAGNOSES  Final diagnoses:  Moderate episode of recurrent major depressive disorder [facility])     ED Discharge Orders    None       Note:  This document was prepared using Dragon voice recognition software and may include unintentional dictation errors.   Irean Hong, MD 08/30/19 [phone]

## 2019-08-29 NOTE — ED Notes (Signed)

## 2019-08-29 NOTE — ED Notes (Signed)
Pt. Alert and oriented, warm and dry, in no distress. Pt. Denies VH. Pt states having SI with plan to cut wrist and having HI thoughts against woman he knocked on door to her home to get his belongings. Patient states he does hear voiced but dont understand what they are saying. Patient contracts for safety. Patient states he drinks as much as he can every day and today he drank a 12 pack of beer and 1/2 pint of liquor. Pt. Encouraged to let nursing staff know of any concerns or needs.

## 2019-08-29 NOTE — ED Notes (Signed)
Patient brought in voluntarily by Sheriff's Department:  Patient trespassing at a distant relatives house. Sheriff's department informed he had to leave. Patient fell out in a neighbors driveway. Sheriff's called again. ETOH on board. Patient informed Sheriff that he's SI, cut his wrist a few days ago, is homeless and has nowhere to go.

## 2019-08-29 NOTE — ED Notes (Addendum)
Pt. Is being dressed out by this tech, he is provided with hospital attire and his belongings are being placed in bag that was provided.  Belongings include :   Black T-shirt   2 packs of cigarettes   About 4 dollars $   Lighter  Blue jeans   Gum   Black pair of shoes  Brown belt   Key chain with pocket knife  White pair of shoes  Black Wallet in rear [place] ) Pocket

## 2019-08-30 LAB — COMPREHENSIVE METABOLIC PANEL
ALT: 16 U/L (ref 0–44)
AST: 21 U/L (ref 15–41)
Albumin: 4 g/dL (ref 3.5–5.0)
Alkaline Phosphatase: 116 U/L (ref 38–126)
Anion gap: 14 (ref 5–15)
BUN: 6 mg/dL (ref 6–20)
CO2: 24 mmol/L (ref 22–32)
Calcium: 9 mg/dL (ref 8.9–10.3)
Chloride: 103 mmol/L (ref 98–111)
Creatinine, Ser: 0.83 mg/dL (ref 0.61–1.24)
GFR calc Af Amer: >60 mL/min (ref >60)
GFR calc non Af Amer: >60 mL/min (ref >60)
Glucose, Bld: 99 mg/dL (ref 70–99)
Potassium: 3.3 mmol/L — ABNORMAL LOW (ref 3.5–5.1)
Sodium: 141 mmol/L (ref 135–145)
Total Bilirubin: 0.6 mg/dL (ref 0.3–1.2)
Total Protein: 8.2 g/dL — ABNORMAL HIGH (ref 6.5–8.1)

## 2019-08-30 LAB — SARS CORONAVIRUS 2 BY RT PCR (HOSPITAL ORDER, PERFORMED IN ~~LOC~~ HOSPITAL LAB): SARS Coronavirus 2: NEGATIVE

## 2019-08-30 LAB — ETHANOL: Alcohol, Ethyl (B): 225 mg/dL — ABNORMAL HIGH (ref <10)

## 2019-08-30 MED ORDER — MELATONIN 5 MG PO TABS
5.0000 mg | ORAL_TABLET | Freq: Every evening | ORAL | Status: DC | PRN
  Administered 2019-08-30 – 2019-08-31 (×2): 5 mg via ORAL
  Filled 2019-08-30 (×2): qty 1

## 2019-08-30 MED ORDER — GABAPENTIN 300 MG PO CAPS
300.0000 mg | ORAL_CAPSULE | Freq: Three times a day (TID) | ORAL | Status: DC
  Administered 2019-08-30 – 2019-09-01 (×6): 300 mg via ORAL
  Filled 2019-08-30 (×6): qty 1

## 2019-08-30 MED ORDER — TETANUS-DIPHTH-ACELL PERTUSSIS 5-2.5-18.5 LF-MCG/0.5 IM SUSP
0.5000 mL | Freq: Once | INTRAMUSCULAR | Status: AC
  Administered 2019-08-30: 0.5 mL via INTRAMUSCULAR
  Filled 2019-08-30: qty 0.5

## 2019-08-30 MED ORDER — DIVALPROEX SODIUM 500 MG PO DR TAB
500.0000 mg | DELAYED_RELEASE_TABLET | Freq: Two times a day (BID) | ORAL | Status: DC
  Administered 2019-08-30 – 2019-09-01 (×5): 500 mg via ORAL
  Filled 2019-08-30 (×5): qty 1

## 2019-08-30 MED ORDER — IBUPROFEN 800 MG PO TABS
800.0000 mg | ORAL_TABLET | ORAL | Status: AC
  Administered 2019-08-30: 800 mg via ORAL
  Filled 2019-08-30: qty 1
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
## 2019-08-30 NOTE — ED Notes (Signed)
Patient currently resting with eyes closed. Respirations even and non labored. Will continue to monitor.  ?

## 2019-08-30 NOTE — ED Provider Notes (Signed)
@  [email or web address]@  Blood pressure 110/72, pulse (!) 105, temperature 98.4 F (36.9 C), temperature source Oral, resp. rate 18, height 5\' 10"  (1.778 m), weight 77.6 kg, SpO2 95 %. Patient is awake alert and says he feels well.  Conjunctivae are normal.  Mucous membranes look normal.  Patient is breathing normally. The patient is calm and cooperative at this time.  There have been no acute events since the last update.  Awaiting disposition plan from Behavioral Medicine team.   , MD 08/30/19 [id number]

## 2019-08-30 NOTE — ED Notes (Signed)
Pt denies SI/HI/AVH on assessment. Pt states he is homeless, advised awaiting SW consult for placement and he verbalized understanding.

## 2019-08-30 NOTE — ED Notes (Signed)
Patient resting with eyes closed. Respirations even and non labored. Will continue to monitor.  

## 2019-08-30 NOTE — ED Notes (Addendum)
Notation in wrong chart.
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
## 2019-08-30 NOTE — Progress Notes (Signed)
[facility] MD Progress Note  08/30/2019 11:41 AM John [name]  MRN:  [id number] Subjective:   I need rehab  Principal Problem: <principal problem not specified> Diagnosis: Active Problems:   Alcohol abuse   Substance induced mood disorder (HCC)   Fall   Alcohol abuse with intoxication (HCC)   Depressive disorder   History of depression   Smoking  Total Time spent with patient: 25-30 min  Past Psychiatric History:  Waxing and waning ETOH use, history of bipolar disorder, possible seizures homeless at present   Past Medical History:  Past Medical History:  Diagnosis Date  . ASD (atrial septal defect)   . Bacterial infection due to H. pylori   . Hypertension   . Kidney stones     Past Surgical History:  Procedure Laterality Date  . ASD REPAIR    . FOOT SURGERY Right   . KNEE SURGERY Right    Family History: No family history on file. Family Psychiatric  History: parents with history of depression and anxiety   Social History:  Social History   Substance and Sexual Activity  Alcohol Use Not Currently   Comment: Last consumed Friday 06/10/19, pt states he is "an alcoholic" and "wants to drink"     Social History   Substance and Sexual Activity  Drug Use Not Currently    Social History   Socioeconomic History  . Marital status: Single    Spouse name: Not on file  . Number of children: Not on file  . Years of education: Not on file  . Highest education level: Not on file  Occupational History  . Not on file  Tobacco Use  . Smoking status: Current Every Day Smoker    Types: Cigarettes  . Smokeless tobacco: Never Used  . Tobacco comment: down to 2 cigarettes a day  Vaping Use  . Vaping Use: Never used  Substance and Sexual Activity  . 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 Drug use: Not Currently  . Sexual activity: Yes    Birth control/protection: None  Other Topics Concern  .  Not on file  Social History Narrative  . Not on file   Social Determinants of Health   Financial Resource Strain: High Risk  . Difficulty of Paying Living Expenses: Very hard  Food Insecurity: No Food Insecurity  . Worried About Programme researcher, broadcasting/film/video in the Last Year: Never true  . Ran Out of Food in the Last Year: Never true  Transportation Needs: Unmet Transportation Needs  . Lack of Transportation (Medical): Yes  . Lack of Transportation (Non-Medical): Yes  Physical Activity:   . Days of Exercise per Week:   . Minutes of Exercise per Session:   Stress:   . Feeling of Stress :   Social Connections: Moderately Isolated  . Frequency of Communication with Friends and Family: Never  . Frequency of Social Gatherings with Friends and Family: Never  . Attends Religious Services: 1 to 4 times per year  . Active Member of Clubs or Organizations: Yes  . Attends Banker Meetings: 1 to 4 times per year  . Marital Status: Never married   Additional Social History:    Pain Medications: See PTA Prescriptions: See PTA Over the Counter: See PTA                    Sleep: less due to  ETOH effects   Appetite:  Less due to ETOH effects   Current Medications: Current Facility-Administered Medications  Medication Dose Route Frequency Provider Last Rate Last Admin  . divalproex (DEPAKOTE) DR tablet 500 mg  500 mg Oral BID Gillermo Murdoch, NP   500 mg at 08/30/19 1006  . gabapentin (NEURONTIN) capsule 300 mg  300 mg Oral TID Gillermo Murdoch, NP   300 mg at 08/30/19 1006  . LORazepam (ATIVAN) tablet 0-4 mg  0-4 mg Oral Q6H Irean Hong, MD      . thiamine tablet 100 mg  100 mg Oral Daily Irean Hong, MD   100 mg at 08/30/19 1006   Current Outpatient Medications  Medication Sig Dispense Refill  . divalproex (DEPAKOTE) 500 MG DR tablet Take 1 tablet (500 mg total) by mouth 2 (two) times daily. (Patient not taking: Reported on 08/10/2019) 60 tablet 0  . gabapentin  (NEURONTIN) 300 MG capsule Take 1 capsule (300 mg total) by mouth 3 (three) times daily. 90 capsule 2  . ketorolac (TORADOL) 10 MG tablet Take 1 tablet (10 mg total) by mouth every 6 (six) hours as needed. 12 tablet 0    Lab Results:  Results for orders placed or performed during the hospital encounter of 08/29/19 (from the past 48 hour(s))  Comprehensive metabolic panel     Status: Abnormal   Collection Time: 08/29/19 11:36 PM  Result Value Ref Range   Sodium 141 135 - 145 mmol/L   Potassium 3.3 (L) 3.5 - 5.1 mmol/L   Chloride 103 98 - 111 mmol/L   CO2 24 22 - 32 mmol/L   Glucose, Bld 99 70 - 99 mg/dL    Comment: Glucose reference range applies only to samples taken after fasting for at least 8 hours.   BUN 6 6 - 20 mg/dL   Creatinine, Ser 2.95 0.61 - 1.24 mg/dL   Calcium 9.0 8.9 - 28.4 mg/dL   Total Protein 8.2 (H) 6.5 - 8.1 g/dL   Albumin 4.0 3.5 - 5.0 g/dL   AST 21 15 - 41 U/L   ALT 16 0 - 44 U/L   Alkaline Phosphatase 116 38 - 126 U/L   Total Bilirubin 0.6 0.3 - 1.2 mg/dL   GFR calc non Af Amer >60 >60 mL/min   GFR calc Af Amer >60 >60 mL/min   Anion gap 14 5 - 15    Comment: Performed at [facility], [address] [place], [place] [address]  Ethanol     Status: Abnormal   Collection Time: 08/29/19 11:36 PM  Result Value Ref Range   Alcohol, Ethyl (B) 225 (H) <10 mg/dL    Comment: (NOTE) Lowest detectable limit for serum alcohol is 10 mg/dL.  For medical purposes only. Performed at [facility], [address] [place], [place] [address]   cbc     Status: None   Collection Time: 08/29/19 11:36 PM  Result Value Ref Range   WBC 8.5 4.0 - 10.5 K/uL   RBC 5.21 4.22 - 5.81 MIL/uL   Hemoglobin 15.7 13.0 - 17.0 g/dL   HCT 25.3 39 - 52 %   MCV 87.5 80.0 - 100.0 fL   MCH 30.1 26.0 - 34.0 pg   MCHC 34.4 30.0 - 36.0 g/dL   RDW 66.4 40.3 - 47.4 %   Platelets 340 150 - 400 K/uL   nRBC 0.0 0.0 - 0.2 %    Comment: Performed at [facility],  [address] [place], [place] [address]    Blood Alcohol level:  Lab Results  Component Value Date   ETH 225 (H) 08/29/2019   ETH <10 06/15/2019    Metabolic Disorder Labs: No results found for: HGBA1C, MPG No results found for: PROLACTIN No results found for: CHOL, TRIG, HDL, CHOLHDL, VLDL, LDLCALC  Physical Findings: AIMS:  , ,  ,  ,    CIWA:  CIWA-Ar Total: 0 COWS:     Musculoskeletal: Strength & Muscle Tone:  Normal   Gait & Station: lying in bed hallway  Patient leans:  None   Psychiatric Specialty Exam: Physical Exam  Review of Systems  Blood pressure 110/72, pulse (!) 105, temperature 98.4 F (36.9 C), temperature source Oral, resp. rate 18, height 5\' 10"  (1.778 m), weight 77.6 kg, SpO2 95 %.Body mass index is 24.54 kg/m.    Mental Status Exam   He is currently still sleepy from ETOH' Answers questions, oriented to person place and time  Consciousness not clouded or fluctuant No active SI HI or plans Mood normal affect normal  No shakes or movement problems  No frank psychosis or mania Fund of knowledge and intelligence not known Memory --no new change Judgement insight reliability poor  Concentration and focus --lowered for now  Aims not done  ADL's--less due to ETOH Cognition --previously clouded from ETOH  Sleep ---currently sleeping                                                       Treatment Plan Summary: He awaits waking up and telling where he wants to go for shelter or rehab  TTS not available today  SW consult placed for longer term treatment and all if he will go     [place], MD 08/30/2019, 11:41 AM

## 2019-08-30 NOTE — TOC Initial Note (Addendum)
Transition of Care [facility]) - Initial/Assessment Note    Patient Details  Name: John [name] MRN: [id number] Date of Birth: 10/11/1980  [facility]) CM/SW Contact:    Joseph Art, LCSWA Phone Number: 08/30/2019, 12:22 PM  Clinical Narrative:                  Patient presents to [facility] w/ BPD, IVC due to behaviors and S/I.  Patient has been assessed and is psychiatrically cleared.  TOC consult requested for assistance in homeless shelter placement and rehab placement.  Patient has been placed in homeless shelters in the area in the past.  Patient has also been given resources for outpatient rehab in the past.  Patient was placed at [facility] and was told if he did not follow the regulations he would not be allowed to return.  CSW will assist in finding homeless shelter placement.   Expected Discharge Plan: Homeless Shelter Barriers to Discharge: ED Uninsured needing medication assistance, ED Patient Insisting on an Alternate Living Situation/Facility, ED Active Substance Abuse, ED Unsafe disposition, ED Facility/Family Refusing to Allow Patient to Return   Patient Goals and CMS Choice Patient states their goals for this hospitalization and ongoing recovery are:: Would like to go to a homeless shelter or rehab      Expected Discharge Plan and Services Expected Discharge Plan: Homeless Shelter In-house Referral: Clinical Social Work     Living arrangements for the past 2 months: Homeless Shelter, No permanent address                                      Prior Living Arrangements/Services Living arrangements for the past 2 months: Homeless Shelter, No permanent address Lives with:: Other (Comment) Patient language and need for interpreter reviewed:: Yes Do you feel safe going back to the place where you live?: No   Patient is currently homeless  Need for Family Participation in Patient Care: No (Comment) Care giver support system in place?: No  (comment)   Criminal Activity/Legal Involvement Pertinent to Current Situation/Hospitalization: No - Comment as needed  Activities of Daily Living Home Assistive Devices/Equipment: None ADL Screening (condition at time of admission) Patient's cognitive ability adequate to safely complete daily activities?: Yes Does the patient have difficulty seeing, even when wearing glasses/contacts?: No Patient able to express need for assistance with ADLs?: Yes Independently performs ADLs?: No Communication: Independent Dressing (OT): Needs assistance Is this a change from baseline?: Pre-admission baseline Grooming: Independent Feeding: Independent Bathing: Independent Toileting: Independent In/Out Bed: Independent Walks in Home: Independent Does the patient have difficulty walking or climbing stairs?: Yes Weakness of Legs: Left Weakness of Arms/Hands: None  Permission Sought/Granted                  Emotional Assessment Appearance:: Appears stated age Attitude/Demeanor/Rapport: Avoidant, Self-Absorbed Affect (typically observed): Anxious Orientation: : Oriented to Self, Oriented to Place, Oriented to  Time, Oriented to Situation Alcohol / Substance Use: Alcohol Use, Illicit Drugs, Tobacco Use Psych Involvement: Yes (comment) (Patient psychiatrically cleared.)  Admission diagnosis:  Voluntary/ETOH Patient Active Problem List   Diagnosis Date Noted  . Chronic back pain 08/10/2019  . Encounter to establish care 07/12/2019  . History of depression 07/12/2019  . Smoking 07/12/2019  . Depressive disorder 06/14/2019  . Sepsis (HCC) 04/30/2019  . Fall 04/30/2019  . Left leg pain 04/30/2019  . Orthopedic hardware present  04/30/2019  . S/P cystoscopy 04/20/19 [facility]. Hx urethroplasty for urethral trauma in 2020 04/30/2019  . Alcohol abuse with intoxication (HCC) 04/30/2019  . Acute metabolic encephalopathy 04/30/2019  . Hypoxia 04/30/2019  . Acute Femoral condyle fracture (HCC)  04/30/2019  . Severe sepsis (HCC) 04/30/2019  . Alcohol abuse 01/14/2018  . Substance induced mood disorder (HCC) 01/14/2018   PCP:  Patient, No Pcp Per Pharmacy:   [facility] [place], [place] - [address] [place] [address] Phone: [phone] Fax: [phone]     Social Determinants of Health (SDOH) Interventions    Readmission Risk Interventions No flowsheet data found.
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
## 2019-08-30 NOTE — ED Notes (Signed)
Gave patient a snack and drink.

## 2019-08-30 NOTE — ED Notes (Signed)
Pt offered a shower but declined. Pt ambulatory to bathroom.

## 2019-08-30 NOTE — ED Notes (Signed)
Pt asleep, breakfast tray placed on bed.  

## 2019-08-31 NOTE — TOC Progression Note (Addendum)
Transition of Care [facility]) - Progression Note    Patient Details  Name: John [name] MRN: [id number] Date of Birth: 18-May-1980  Transition [facility] Care [facility]) CM/SW Contact  Joseph Art, [place] Phone Number: 08/31/2019, 10:53 AM  Clinical Narrative:     CSW dropped off list for homeless shelters in the [place] area that accept males.  This CSW spoke with the [facility] RN and explained the shelters require the patient to contact them directly to do an assessment for possible placement.  [facility] RN stated she would give the patient the list and allow him use of the phone to contact the shelters.  This CSW spoke with EDP to update her on patient status and placement.  This CSW and EDP spoke about the patient being medically and psychiatrically cleared able to be d/c and no guarantee for homeless shelter. CSW will complete consult.   Expected Discharge Plan: Homeless Shelter Barriers to Discharge: ED Uninsured needing medication assistance, ED Patient Insisting on an Alternate Living Situation/Facility, ED Active Substance Abuse, ED Unsafe disposition, ED Facility/Family Refusing to Allow Patient to Return  Expected Discharge Plan and Services Expected Discharge Plan: Homeless Shelter In-house Referral: Clinical Social Work     Living arrangements for the past 2 months: Homeless Shelter, No permanent address                                       Social Determinants of Health (SDOH) Interventions    Readmission Risk Interventions No flowsheet data found.

## 2019-08-31 NOTE — ED Provider Notes (Addendum)
Emergency Medicine Observation Re-evaluation Note  John [name] is a 39 y.o. male, seen on rounds today.  Pt initially presented to the ED for complaints of Psychiatric Evaluation Currently, the patient is attempting to make phone calls for shelters. Denies any other concerns.   Physical Exam  BP 134/80 (BP Location: Right Arm)   Pulse 81   Temp 98 F (36.7 C) (Oral)   Resp 16   Ht 5\' 10"  (1.778 m)   Wt 77.6 kg   SpO2 97%   BMI 24.54 kg/m  Physical Exam Physical Exam General: No apparent distress HEENT: moist mucous membranes CV: RRR Pulm: Normal WOB GI: soft and non tender MSK: no edema or cyanosis Neuro: face symmetric, moving all extremities    ED Course / MDM  EKG:  Clinical Course as of Aug 30 841  Tue Aug 30, 2019  0135 Patient has been evaluated by psychiatric NP who recommends reassessment in the am. The patient has been placed in psychiatric observation due to the need to provide a safe environment for the patient while obtaining psychiatric consultation and evaluation, as well as ongoing medical and medication management to treat the patient's condition. The patient has not been placed under full IVC at this time.      [JS]    Clinical Course User Index [JS] 0136, MD   I have reviewed the labs performed to date as well as medications administered while in observation.  Recent changes in the last 24 hours include no new labs. CIWA on board.   Plan  Current plan is for SW placement.   Patient is not under full IVC at this time.  Cleared by psych team dc home.    Irean Hong, MD 08/31/19 1101    11/01/19, MD 08/31/19 1426

## 2019-08-31 NOTE — ED Notes (Signed)
Pt denies SI/HI/AVH on assessment. Provided with list from SW to call and interview for bed availability
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
## 2019-08-31 NOTE — Final Progress Note (Signed)
Physician Final Progress Note  Patient ID: John [name] MRN: [id number] DOB/AGE: April 07, 1980 39 y.o.  Admit date: 08/29/2019 Admitting provider: No admitting provider for patient encounter. Discharge date: 08/31/2019   Admission Diagnoses:   ETOH dependence and ETOH intoxication, personality disorder NOS   Discharge Diagnoses:  Active Problems:   Alcohol abuse   Substance induced mood disorder (HCC)   Fall   Alcohol abuse with intoxication (HCC)   Depressive disorder   History of depression   Smoking   Same   Consults:   SW TTS Psych MD ED MD   Significant Findings/ Diagnostic Studies:   BAL was elevated, he is now more sober in general   Procedures:  None   Discharge Condition:  Fair   Disposition: -- Possible Shelter   Diet:  As tolerated   Discharge Activity:  As tolerated   Recommend AA NA related programs    His MS is back at baseline  He is without active SI HI or plans  No other new psychosis or mania Contracts for safety at this time Not clouded or fluctuant  Mood no other new change     Total time spent taking care of this patient:  minutes  Signed: Roselind Messier 08/31/2019, 1:04 PM

## 2019-09-01 ENCOUNTER — Emergency Department (HOSPITAL_COMMUNITY): Payer: Self-pay

## 2019-09-01 ENCOUNTER — Other Ambulatory Visit: Payer: Self-pay

## 2019-09-01 ENCOUNTER — Encounter (HOSPITAL_COMMUNITY): Payer: Self-pay | Admitting: Emergency Medicine

## 2019-09-01 ENCOUNTER — Emergency Department (HOSPITAL_COMMUNITY)
Admission: EM | Admit: 2019-09-01 | Discharge: 2019-09-02 | Disposition: A | Discharge status: Home / Self Care | Payer: Self-pay | Attending: Emergency Medicine | Admitting: Emergency Medicine

## 2019-09-01 DIAGNOSIS — I10 Essential (primary) hypertension: Secondary | ICD-10-CM | POA: Insufficient documentation

## 2019-09-01 DIAGNOSIS — M25552 Pain in left hip: Secondary | ICD-10-CM | POA: Insufficient documentation

## 2019-09-01 DIAGNOSIS — M25559 Pain in unspecified hip: Secondary | ICD-10-CM

## 2019-09-01 DIAGNOSIS — R45851 Suicidal ideations: Secondary | ICD-10-CM | POA: Insufficient documentation

## 2019-09-01 DIAGNOSIS — Z590 Homelessness: Secondary | ICD-10-CM | POA: Insufficient documentation

## 2019-09-01 DIAGNOSIS — R079 Chest pain, unspecified: Secondary | ICD-10-CM | POA: Insufficient documentation

## 2019-09-01 DIAGNOSIS — F17210 Nicotine dependence, cigarettes, uncomplicated: Secondary | ICD-10-CM | POA: Insufficient documentation

## 2019-09-01 LAB — COMPREHENSIVE METABOLIC PANEL
ALT: 18 U/L (ref 0–44)
AST: 17 U/L (ref 15–41)
Albumin: 4 g/dL (ref 3.5–5.0)
Alkaline Phosphatase: 135 U/L — ABNORMAL HIGH (ref 38–126)
Anion gap: 11 (ref 5–15)
BUN: 7 mg/dL (ref 6–20)
CO2: 27 mmol/L (ref 22–32)
Calcium: 9.4 mg/dL (ref 8.9–10.3)
Chloride: 101 mmol/L (ref 98–111)
Creatinine, Ser: 0.73 mg/dL (ref 0.61–1.24)
GFR calc Af Amer: >60 mL/min (ref >60)
GFR calc non Af Amer: >60 mL/min (ref >60)
Glucose, Bld: 92 mg/dL (ref 70–99)
Potassium: 4.4 mmol/L (ref 3.5–5.1)
Sodium: 139 mmol/L (ref 135–145)
Total Bilirubin: 0.5 mg/dL (ref 0.3–1.2)
Total Protein: 8.5 g/dL — ABNORMAL HIGH (ref 6.5–8.1)

## 2019-09-01 LAB — CBC
HCT: 51 % (ref 39.0–52.0)
Hemoglobin: 17 g/dL (ref 13.0–17.0)
MCH: 30 pg (ref 26.0–34.0)
MCHC: 33.3 g/dL (ref 30.0–36.0)
MCV: 90.1 fL (ref 80.0–100.0)
Platelets: 295 10*3/uL (ref 150–400)
RBC: 5.66 MIL/uL (ref 4.22–5.81)
RDW: 13.3 % (ref 11.5–15.5)
WBC: 9.7 10*3/uL (ref 4.0–10.5)
nRBC: 0 % (ref 0.0–0.2)

## 2019-09-01 LAB — RAPID URINE DRUG SCREEN, HOSP PERFORMED
Amphetamines: NOT DETECTED
Barbiturates: NOT DETECTED
Benzodiazepines: NOT DETECTED
Cocaine: NOT DETECTED
Opiates: NOT DETECTED
Tetrahydrocannabinol: NOT DETECTED

## 2019-09-01 LAB — SALICYLATE LEVEL: Salicylate Lvl: <7 mg/dL — ABNORMAL LOW (ref 7.0–30.0)

## 2019-09-01 LAB — ACETAMINOPHEN LEVEL: Acetaminophen (Tylenol), Serum: <10 ug/mL — ABNORMAL LOW (ref 10–30)

## 2019-09-01 LAB — ETHANOL: Alcohol, Ethyl (B): 167 mg/dL — ABNORMAL HIGH (ref <10)

## 2019-09-01 LAB — TROPONIN I (HIGH SENSITIVITY): Troponin I (High Sensitivity): 5 ng/L (ref <18)

## 2019-09-01 NOTE — ED Triage Notes (Signed)
Patient arrived with EMS from street reports left  chest pain today , no SOB , denies emesis or diaphoresis . Patient added suicidal ideation plans to jump in front of cars . Denies hallucinations .

## 2019-09-01 NOTE — ED Notes (Signed)
breakfas provided, patient ate 100%. Call received from [facility] [employer], phone given to patient for intake. Patient reported [facility] did not have available bed for him. Patient was advised he will be discharged today. Patient responded if he has no where to go he will just check himself in again. Dr. Larinda Buttery made aware of patient response to being discharge. Md. At bedside to discuss discharge with patient and to provide other resources for shelter.

## 2019-09-01 NOTE — ED Notes (Signed)

## 2019-09-01 NOTE — ED Provider Notes (Signed)
Emergency Medicine Observation Re-evaluation Note  John [name] is a 39 y.o. male, seen on rounds today.  Pt initially presented to the ED for complaints of Psychiatric Evaluation Currently, the patient is resting in bed, requesting additional assistant with homeless shelters.  Physical Exam  BP 111/74 (BP Location: Right Arm)   Pulse 77   Temp 98 F (36.7 C) (Oral)   Resp 18   Ht 5\' 10"  (1.778 m)   Wt 77.6 kg   SpO2 96%   BMI 24.54 kg/m  Physical Exam   Constitutional: Resting comfortably. Eyes: Conjunctivae are normal. Head: Atraumatic. Nose: No congestion/rhinnorhea. Mouth/Throat: Mucous membranes are moist. Neck: Normal ROM Cardiovascular: No cyanosis noted. Respiratory: Normal respiratory effort. Gastrointestinal: Non-distended. Genitourinary: deferred Musculoskeletal: No lower extremity tenderness nor edema. Neurologic:  Normal speech and language. No gross focal neurologic deficits are appreciated. Skin:  Skin is warm, dry and intact. No rash noted.   ED Course / MDM  EKG:   I have reviewed the labs performed to date as well as medications administered while in observation.  Recent changes in the last 24 hours include patient cleared by psychiatry and provided with resources for homeless shelters by social work. Plan  Current plan is for discharge home with resources for homeless shelters.. Patient is not under full IVC at this time.   , MD 09/01/19 0930

## 2019-09-01 NOTE — TOC Transition Note (Signed)
Transition of Care [facility]) - CM/SW Discharge Note   Patient Details  Name: John [name] MRN: [id number] Date of Birth: Aug 02, 1980  [facility]) CM/SW Contact:  Joseph Art, LCSWA Phone Number: 09/01/2019, 12:00 PM   Clinical Narrative:     Patient will d/c to [facility] via [employer]. EDP/EDStaff notified.  Final next level of care: Homeless Shelter Barriers to Discharge: ED Uninsured needing medication assistance, ED Patient Insisting on an Alternate Living Situation/Facility, ED Active Substance Abuse, ED Unsafe disposition, ED Facility/Family Refusing to Allow Patient to Return   Patient Goals and CMS Choice Patient states their goals for this hospitalization and ongoing recovery are:: Would like to go to a homeless shelter or rehab      Discharge Placement                       Discharge Plan and Services In-house Referral: Clinical Social Work                                   Social Determinants of Health (SDOH) Interventions     Readmission Risk Interventions No flowsheet data found.

## 2019-09-02 ENCOUNTER — Emergency Department (HOSPITAL_COMMUNITY): Payer: Self-pay

## 2019-09-02 NOTE — ED Notes (Signed)
Belongings placed in locker #1 
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
## 2019-09-02 NOTE — ED Notes (Signed)
Pt calling CSW on hospital phone before leaving for D/C

## 2019-09-02 NOTE — ED Provider Notes (Signed)
[facility] EMERGENCY DEPARTMENT Provider Note   CSN: [id number] Arrival date & time: 09/01/19  2115     History Chief Complaint  Patient presents with  . Suicidal  . Chest Pain    John [name] is a 39 y.o. male.  Patient with multiple complaints but ultimately I think he is here because he has nowhere to go.  He states he has chest pain has been going on for multiple days but cannot really describe it any further.  He also states he has left-sided hip pain after a fall earlier today.  He also states he is suicidal when asked to expand upon that in reference to his recent 2-day stay at [facility] he states he is chronically suicidal and this really has not changed.  He has no new active plans.  States he is homeless and is nowhere to stay.  Requested to see a Child psychotherapist.   Chest Pain      Past Medical History:  Diagnosis Date  . ASD (atrial septal defect)   . Bacterial infection due to H. pylori   . Hypertension   . Kidney stones     Patient Active Problem List   Diagnosis Date Noted  . Chronic back pain 08/10/2019  . Encounter to establish care 07/12/2019  . History of depression 07/12/2019  . Smoking 07/12/2019  . Depressive disorder 06/14/2019  . Sepsis (HCC) 04/30/2019  . Fall 04/30/2019  . Left leg pain 04/30/2019  . Orthopedic hardware present 04/30/2019  . S/P cystoscopy 04/20/19 [facility]. Hx urethroplasty for urethral trauma in 2020 04/30/2019  . Alcohol abuse with intoxication (HCC) 04/30/2019  . Acute metabolic encephalopathy 04/30/2019  . Hypoxia 04/30/2019  . Acute Femoral condyle fracture (HCC) 04/30/2019  . Severe sepsis (HCC) 04/30/2019  . Alcohol abuse 01/14/2018  . Substance induced mood disorder (HCC) 01/14/2018    Past Surgical History:  Procedure Laterality Date  . ASD REPAIR    . FOOT SURGERY Right   . KNEE SURGERY Right        No family history on file.  Social History   Tobacco Use  . Smoking status: Current  Every Day Smoker    Types: Cigarettes  . Smokeless tobacco: Never Used  . Tobacco comment: down to 2 cigarettes a day  Vaping Use  . Vaping Use: Never used  Substance Use Topics  . 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 Drug use: Not Currently    Home Medications Prior to Admission medications   Medication Sig Start Date End Date Taking? Authorizing Provider  divalproex (DEPAKOTE) 500 MG DR tablet Take 1 tablet (500 mg total) by mouth 2 (two) times daily. Patient not taking: Reported on 08/10/2019 06/15/19 07/15/19  Shaune Pollack, MD  gabapentin (NEURONTIN) 300 MG capsule Take 1 capsule (300 mg total) by mouth 3 (three) times daily. 08/10/19   Iloabachie, Chioma E, NP  ketorolac (TORADOL) 10 MG tablet Take 1 tablet (10 mg total) by mouth every 6 (six) hours as needed. 08/04/19   Arnaldo Natal, MD    Allergies    Penicillins  Review of Systems   Review of Systems  Cardiovascular: Positive for chest pain.    Physical Exam Updated Vital Signs BP 119/71 (BP Location: Right Arm)   Pulse 87   Temp 98.2 F (36.8 C) (Oral)   Resp 14   Ht 5\' 8"  (1.727  m)   Wt 85 kg   SpO2 97%   BMI 28.49 kg/m   Physical Exam Vitals and nursing note reviewed.  Constitutional:      Appearance: He is well-developed.  HENT:     Head: Normocephalic and atraumatic.     Nose: No congestion or rhinorrhea.     Mouth/Throat:     Mouth: Mucous membranes are moist.     Pharynx: Oropharynx is clear.  Eyes:     Pupils: Pupils are equal, round, and reactive to light.  Cardiovascular:     Rate and Rhythm: Normal rate.  Pulmonary:     Effort: Pulmonary effort is normal. No respiratory distress.  Abdominal:     General: There is no distension or abdominal bruit.  Musculoskeletal:        General: Normal range of motion.     Cervical back: Normal range of motion.  Skin:    General: Skin is warm and dry.   Neurological:     General: No focal deficit present.     Mental Status: He is alert and oriented to person, place, and time.     ED Results / Procedures / Treatments   Labs (all labs ordered are listed, but only abnormal results are displayed) Labs Reviewed  COMPREHENSIVE METABOLIC PANEL - Abnormal; Notable for the following components:      Result Value   Total Protein 8.5 (*)    Alkaline Phosphatase 135 (*)    All other components within normal limits  ETHANOL - Abnormal; Notable for the following components:   Alcohol, Ethyl (B) 167 (*)    All other components within normal limits  SALICYLATE LEVEL - Abnormal; Notable for the following components:   Salicylate Lvl <7.0 (*)    All other components within normal limits  ACETAMINOPHEN LEVEL - Abnormal; Notable for the following components:   Acetaminophen (Tylenol), Serum <10 (*)    All other components within normal limits  CBC  RAPID URINE DRUG SCREEN, HOSP PERFORMED  TROPONIN I (HIGH SENSITIVITY)    EKG EKG Interpretation  Date/Time:  Thursday September 01 2019 21:41:10 EDT Ventricular Rate:  94 PR Interval:  148 QRS Duration: 100 QT Interval:  348 QTC Calculation: 435 R Axis:   109 Text Interpretation: Normal sinus rhythm Rightward axis Borderline ECG No significant change since last tracing Confirmed by Marily Memos [phone]) on 09/02/2019 6:09:52 AM   Radiology DG Chest 2 View  Result Date: 09/01/2019 CLINICAL DATA:  Chest pain EXAM: CHEST - 2 VIEW COMPARISON:  08/04/2019, CT chest 08/04/2019, 06/12/2019, 04/30/2019 FINDINGS: Chronic elevation of left diaphragm with linear scarring. No acute consolidation or effusion. Stable cardiomediastinal silhouette. No pneumothorax. Surgical rod and fixating screws in the thoracolumbar spine. IMPRESSION: No active cardiopulmonary disease. Chronic elevation left diaphragm with presumed scarring at the left base Electronically Signed   By: Jasmine Pang M.D.   On: 09/01/2019 23:01    DG Hip Unilat W or Wo Pelvis 2-3 Views Left  Result Date: 09/02/2019 CLINICAL DATA:  Left chest pain, fall, suicidal ideation EXAM: DG HIP (WITH OR WITHOUT PELVIS) 2-3V LEFT COMPARISON:  Radiograph 06/14/2019 FINDINGS: Postsurgical changes from left SI fusion secured by a partially threaded, cannulated screw as well as a left femoral intramedullary nail and transcervical fixation with posttraumatic deformities at the left lesser trochanter and right superior pubic ramus similar to prior. No acute osseous injury or diastasis of the pelvis is seen. The proximal femora are intact and normally located. Degenerative changes throughout  the lower lumbar spine, hips and pelvis are mild and similar to prior. Mild lateral left hip soft tissue thickening compatible postsurgical scarring. No acute soft tissue abnormality. IMPRESSION: 1. No acute osseous abnormality. 2. Stable postsurgical changes from prior left SI fusion and left femoral intramedullary nail and transcervical fixation. Electronically Signed   By: Kreg Shropshire M.D.   On: 09/02/2019 00:20    Procedures Procedures (including critical care time)  Medications Ordered in ED Medications - No data to display  ED Course  I have reviewed the triage vital signs and the nursing notes.  Pertinent labs & imaging results that were available during my care of the patient were reviewed by me and considered in my medical decision making (see chart for details).    MDM Rules/Calculators/A&P                           Likely malingering but will give benefit of doubt and ask SW to see in AM for substance abuse centers and possible shelters/group homes/etc. Would not IVC at this point unless newly worrisome for SI with more definitive plan rather.  Final Clinical Impression(s) / ED Diagnoses Final diagnoses:  Suicidal thoughts  Hip pain  Chest pain, unspecified type    Rx / DC Orders ED Discharge Orders    None       Oron Westrup, Barbara Cower, MD 09/02/19  [phone]

## 2019-09-02 NOTE — ED Notes (Addendum)
Pt's belongings returned to him for D/C. Pt expressed understanding of D/C paperwork and following up with the CSW. Pt given 2 bus passes to help him get to a shelter to stay at tonight.

## 2019-09-02 NOTE — Discharge Instructions (Signed)
Please contact Ciara at the [facility] - [phone]. Leave a voicemail on the secure line with the following information: identifying information for you including your name, birthday, and the place(s) that you usually sleep at night or can be easily located so that [place] can find you in the community.   [facility] [address] [place], [place] [address] [phone]

## 2019-09-02 NOTE — ED Notes (Signed)
Lunch Tray Ordered @ 1049. °

## 2019-09-05 ENCOUNTER — Emergency Department
Admission: EM | Admit: 2019-09-05 | Discharge: 2019-09-09 | Disposition: A | Discharge status: Other Acute Inpatient Hospital | Payer: Self-pay | Attending: Emergency Medicine | Admitting: Emergency Medicine

## 2019-09-05 ENCOUNTER — Other Ambulatory Visit: Payer: Self-pay

## 2019-09-05 ENCOUNTER — Emergency Department: Payer: Self-pay

## 2019-09-05 DIAGNOSIS — Z8659 Personal history of other mental and behavioral disorders: Secondary | ICD-10-CM

## 2019-09-05 DIAGNOSIS — F32A Depression, unspecified: Secondary | ICD-10-CM | POA: Diagnosis present

## 2019-09-05 DIAGNOSIS — F10129 Alcohol abuse with intoxication, unspecified: Secondary | ICD-10-CM | POA: Diagnosis present

## 2019-09-05 DIAGNOSIS — F329 Major depressive disorder, single episode, unspecified: Secondary | ICD-10-CM | POA: Diagnosis present

## 2019-09-05 DIAGNOSIS — G8929 Other chronic pain: Secondary | ICD-10-CM | POA: Diagnosis present

## 2019-09-05 DIAGNOSIS — R45851 Suicidal ideations: Secondary | ICD-10-CM | POA: Insufficient documentation

## 2019-09-05 DIAGNOSIS — W19XXXA Unspecified fall, initial encounter: Secondary | ICD-10-CM

## 2019-09-05 DIAGNOSIS — M79605 Pain in left leg: Secondary | ICD-10-CM | POA: Diagnosis present

## 2019-09-05 DIAGNOSIS — F10920 Alcohol use, unspecified with intoxication, uncomplicated: Secondary | ICD-10-CM

## 2019-09-05 DIAGNOSIS — F17200 Nicotine dependence, unspecified, uncomplicated: Secondary | ICD-10-CM | POA: Diagnosis present

## 2019-09-05 DIAGNOSIS — I10 Essential (primary) hypertension: Secondary | ICD-10-CM | POA: Insufficient documentation

## 2019-09-05 DIAGNOSIS — M549 Dorsalgia, unspecified: Secondary | ICD-10-CM | POA: Diagnosis present

## 2019-09-05 DIAGNOSIS — Y907 Blood alcohol level of 200-239 mg/100 ml: Secondary | ICD-10-CM | POA: Insufficient documentation

## 2019-09-05 DIAGNOSIS — Z79899 Other long term (current) drug therapy: Secondary | ICD-10-CM | POA: Insufficient documentation

## 2019-09-05 DIAGNOSIS — F17210 Nicotine dependence, cigarettes, uncomplicated: Secondary | ICD-10-CM | POA: Insufficient documentation

## 2019-09-05 DIAGNOSIS — F1994 Other psychoactive substance use, unspecified with psychoactive substance-induced mood disorder: Secondary | ICD-10-CM | POA: Diagnosis present

## 2019-09-05 DIAGNOSIS — F1010 Alcohol abuse, uncomplicated: Secondary | ICD-10-CM | POA: Diagnosis present

## 2019-09-05 DIAGNOSIS — Z20822 Contact with and (suspected) exposure to covid-19: Secondary | ICD-10-CM | POA: Insufficient documentation

## 2019-09-05 DIAGNOSIS — IMO0001 Reserved for inherently not codable concepts without codable children: Secondary | ICD-10-CM | POA: Diagnosis present

## 2019-09-05 LAB — URINE DRUG SCREEN, QUALITATIVE (ARMC ONLY)
Amphetamines, Ur Screen: NOT DETECTED
Barbiturates, Ur Screen: NOT DETECTED
Benzodiazepine, Ur Scrn: NOT DETECTED
Cannabinoid 50 Ng, Ur ~~LOC~~: NOT DETECTED
Cocaine Metabolite,Ur ~~LOC~~: NOT DETECTED
MDMA (Ecstasy)Ur Screen: NOT DETECTED
Methadone Scn, Ur: NOT DETECTED
Opiate, Ur Screen: NOT DETECTED
Phencyclidine (PCP) Ur S: NOT DETECTED
Tricyclic, Ur Screen: NOT DETECTED

## 2019-09-05 LAB — ACETAMINOPHEN LEVEL: Acetaminophen (Tylenol), Serum: <10 ug/mL — ABNORMAL LOW (ref 10–30)

## 2019-09-05 LAB — COMPREHENSIVE METABOLIC PANEL
ALT: 14 U/L (ref 0–44)
AST: 18 U/L (ref 15–41)
Albumin: 4.1 g/dL (ref 3.5–5.0)
Alkaline Phosphatase: 128 U/L — ABNORMAL HIGH (ref 38–126)
Anion gap: 8 (ref 5–15)
BUN: 8 mg/dL (ref 6–20)
CO2: 27 mmol/L (ref 22–32)
Calcium: 8.5 mg/dL — ABNORMAL LOW (ref 8.9–10.3)
Chloride: 102 mmol/L (ref 98–111)
Creatinine, Ser: 0.77 mg/dL (ref 0.61–1.24)
GFR calc Af Amer: >60 mL/min (ref >60)
GFR calc non Af Amer: >60 mL/min (ref >60)
Glucose, Bld: 103 mg/dL — ABNORMAL HIGH (ref 70–99)
Potassium: 3.4 mmol/L — ABNORMAL LOW (ref 3.5–5.1)
Sodium: 137 mmol/L (ref 135–145)
Total Bilirubin: 0.4 mg/dL (ref 0.3–1.2)
Total Protein: 8.3 g/dL — ABNORMAL HIGH (ref 6.5–8.1)

## 2019-09-05 LAB — CBC
HCT: 45 % (ref 39.0–52.0)
Hemoglobin: 15.4 g/dL (ref 13.0–17.0)
MCH: 30.1 pg (ref 26.0–34.0)
MCHC: 34.2 g/dL (ref 30.0–36.0)
MCV: 88.1 fL (ref 80.0–100.0)
Platelets: 238 10*3/uL (ref 150–400)
RBC: 5.11 MIL/uL (ref 4.22–5.81)
RDW: 13.7 % (ref 11.5–15.5)
WBC: 8 10*3/uL (ref 4.0–10.5)
nRBC: 0 % (ref 0.0–0.2)

## 2019-09-05 LAB — ETHANOL: Alcohol, Ethyl (B): 212 mg/dL — ABNORMAL HIGH (ref <10)

## 2019-09-05 LAB — SALICYLATE LEVEL: Salicylate Lvl: <7 mg/dL — ABNORMAL LOW (ref 7.0–30.0)

## 2019-09-05 MED ORDER — THIAMINE HCL 100 MG PO TABS
100.0000 mg | ORAL_TABLET | Freq: Every day | ORAL | Status: DC
  Administered 2019-09-06 – 2019-09-09 (×4): 100 mg via ORAL
  Filled 2019-09-05 (×3): qty 1

## 2019-09-05 MED ORDER — KETOROLAC TROMETHAMINE 10 MG PO TABS
10.0000 mg | ORAL_TABLET | Freq: Four times a day (QID) | ORAL | Status: DC | PRN
  Administered 2019-09-06 – 2019-09-09 (×2): 10 mg via ORAL
  Filled 2019-09-05 (×7): qty 1

## 2019-09-05 MED ORDER — GABAPENTIN 300 MG PO CAPS
300.0000 mg | ORAL_CAPSULE | Freq: Three times a day (TID) | ORAL | Status: DC
  Administered 2019-09-06 – 2019-09-09 (×11): 300 mg via ORAL
  Filled 2019-09-05 (×11): qty 1

## 2019-09-05 MED ORDER — LORAZEPAM 2 MG PO TABS
0.0000 mg | ORAL_TABLET | Freq: Four times a day (QID) | ORAL | Status: AC
  Administered 2019-09-05 – 2019-09-07 (×5): 2 mg via ORAL
  Filled 2019-09-05 (×6): qty 1

## 2019-09-05 MED ORDER — DIVALPROEX SODIUM 500 MG PO DR TAB
500.0000 mg | DELAYED_RELEASE_TABLET | Freq: Two times a day (BID) | ORAL | Status: DC
  Administered 2019-09-06: 500 mg via ORAL
  Filled 2019-09-05: qty 1

## 2019-09-05 MED ORDER — MELATONIN 5 MG PO TABS
5.0000 mg | ORAL_TABLET | Freq: Every day | ORAL | Status: DC
  Administered 2019-09-06 – 2019-09-08 (×3): 5 mg via ORAL
  Filled 2019-09-05 (×3): qty 1

## 2019-09-05 MED ORDER — LORAZEPAM 2 MG/ML IJ SOLN
0.0000 mg | Freq: Four times a day (QID) | INTRAMUSCULAR | Status: AC
  Administered 2019-09-07: 2 mg via INTRAVENOUS

## 2019-09-05 MED ORDER — THIAMINE HCL 100 MG/ML IJ SOLN: 100.0000 mg | Freq: Every day | INTRAMUSCULAR | Status: DC

## 2019-09-05 MED ORDER — IBUPROFEN 800 MG PO TABS
800.0000 mg | ORAL_TABLET | Freq: Once | ORAL | Status: AC
  Administered 2019-09-05: 800 mg via ORAL
  Filled 2019-09-05: qty 1

## 2019-09-05 MED ORDER — LORAZEPAM 2 MG/ML IJ SOLN: 0.0000 mg | Freq: Two times a day (BID) | INTRAMUSCULAR | Status: DC

## 2019-09-05 MED ORDER — LORAZEPAM 2 MG PO TABS
0.0000 mg | ORAL_TABLET | Freq: Two times a day (BID) | ORAL | Status: DC
  Administered 2019-09-09: 1 mg via ORAL
  Filled 2019-09-05: qty 1

## 2019-09-05 NOTE — ED Notes (Signed)
PT  MOVED  TO  [facility]  UNIT  /  VOL  PENDING  CONSULT
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
## 2019-09-05 NOTE — ED Notes (Signed)
Pt dressed out into hospital provided clothing with this tech and [employer] in the rm. Pt belongings consist of a red shirt, black shoes, grey boxers, white socks, blue jean pants, a black wallet, a brown belt, a back pack with a cell phone, clothing and flip flops. Pt has no cash on him. Pt belongings placed into a pt belongings bag and labeled with pt name. Pt has two bags pf belongings. Pt calm and cooperative while dressing out.

## 2019-09-05 NOTE — ED Notes (Signed)
Nurse called report to the [facility], reported to Freddy Jaksch, Patient is transferring to room 4.

## 2019-09-05 NOTE — ED Notes (Addendum)
pt is reporting he fall last night and is having pain to his left hip and knee.  CMS intact. Dr Lenard Lance informed.

## 2019-09-05 NOTE — ED Notes (Signed)
Patient is awake, ask for drinks, he is considerate, and cooperative, will continue to monitor.

## 2019-09-05 NOTE — ED Notes (Signed)
Nurse talked to him about how he was feeling and He states that He feels better that He is here, no thoughts of hurting himself, but if He leaves He thinks those negative thoughts will come back, Nurse will continue to monitor.

## 2019-09-05 NOTE — ED Notes (Signed)
Pt returned from xray and wanded back into the unit by Engineer, materials

## 2019-09-05 NOTE — ED Notes (Signed)
Pt received snack tray and vitals obtained.   lw edt

## 2019-09-05 NOTE — ED Notes (Signed)
VOl, pend psych consult 
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
## 2019-09-05 NOTE — ED Notes (Signed)
Patient transported to X-ray via wheelchair with EDT Vernona Rieger and [employer].
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
## 2019-09-05 NOTE — ED Notes (Signed)
TTS evaluating Patient.

## 2019-09-06 MED ORDER — NICOTINE 21 MG/24HR TD PT24
21.0000 mg | MEDICATED_PATCH | Freq: Every day | TRANSDERMAL | Status: DC
  Administered 2019-09-06 – 2019-09-09 (×4): 21 mg via TRANSDERMAL
  Filled 2019-09-06 (×4): qty 1

## 2019-09-06 MED ORDER — DIVALPROEX SODIUM 500 MG PO DR TAB
1000.0000 mg | DELAYED_RELEASE_TABLET | Freq: Two times a day (BID) | ORAL | Status: DC
  Administered 2019-09-06 – 2019-09-09 (×6): 1000 mg via ORAL
  Filled 2019-09-06 (×6): qty 2

## 2019-09-06 MED ORDER — DULOXETINE HCL 30 MG PO CPEP
30.0000 mg | ORAL_CAPSULE | Freq: Every day | ORAL | Status: DC
  Administered 2019-09-06 – 2019-09-09 (×4): 30 mg via ORAL
  Filled 2019-09-06 (×5): qty 1

## 2019-09-06 MED ORDER — OLANZAPINE 5 MG PO TBDP
5.0000 mg | ORAL_TABLET | Freq: Every day | ORAL | Status: DC
  Administered 2019-09-06 – 2019-09-08 (×3): 5 mg via ORAL
  Filled 2019-09-06 (×5): qty 1

## 2019-09-06 NOTE — ED Notes (Signed)
Pt denies HI/AVH but endorsing SI with no plan. Pt able to contract for safety. Awaiting psych evaluation, TTS has already been in to speak with pt.

## 2019-09-06 NOTE — BH Assessment (Addendum)
TTS completed reassessment. Pt presented calm, pleasant and oriented x 3. Pt reports feeling depressed and to be experiencing body pains (hip & lower back). Pt currently endorses SI stating " I wish I wasn't here" and expressed a plan to do whatever he can do to end his life. Pt denies any current HI/AH/VH and is unable to contract for safety.   Final disposition is pending psych reassessment

## 2019-09-06 NOTE — ED Notes (Signed)
VOL  PENDING  PLACEMENT 

## 2019-09-06 NOTE — Progress Notes (Signed)
Patient ID: John [name], male   DOB: 1980-11-29, 39 y.o.   MRN: [id number]   [name] MD   Patient in [facility] door admit history  Mainly issues of Bipolar Disorder, non compliance, ETOH dependence and personality disorder   He is not homeless but says he is actively suicidal and has a plan to harm self  Part of this is secondary gain and need for admission, however we have to Err on the side of caution.  Remains in house on obs with restarting meds and to see if he will be stable enough to go to [address] shelter and all   Like [facility].  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   MS   About the same as last time  Half asleep  Wakes up  Disheveled unkept  Oriented to person place date and time Not clouded or fluctuant Mood somewhat depressed Affect normal  Not severely anxious Memory remote recent and immediate normal through general questions

## 2019-09-07 NOTE — BH Assessment (Signed)
Writer spoke with the patient to complete an updated/reassessment. Pt was drowsy with slurred speech upon interview. Pt presented with a disheveled appearance and reported feelings of depression. Patient continues to endorse thoughts of SI and denied AV/H.  1:10 PM Pt.has been psych cleared, per psych MD. Pt was given a list of [facility] to contact for placement.

## 2019-09-07 NOTE — ED Notes (Signed)
Pt given meal tray and a cup of sprite.  

## 2019-09-07 NOTE — ED Notes (Signed)
Report received from [facility]. Patient care assumed. Patient/RN introduction complete. Will continue to monitor. Pt sitting on bed, no complaints at this time. States denies any HI but has thoughts of suicide daily per pt. Pt denies any current active thoughts or plan. Pt contracted for safety with this RN and will advise if any active suicidal thoughts. Pt pending disposition at this time per psychiatry and is aware of plan.

## 2019-09-07 NOTE — ED Notes (Signed)
Pt given snack tray at this time.  

## 2019-09-07 NOTE — ED Notes (Signed)
Patient observed with no unusual behavior or acute distress. Patient with no verbalized needs or c/o at this time.... will continue to monitor and follow up as needed. Security staff monitoring patient on camera system.  

## 2019-09-07 NOTE — ED Notes (Signed)
Patient offered shower afer breakfast this morning. Patient refused and stated maybe later today.  °

## 2019-09-07 NOTE — ED Notes (Signed)
Patient observed with no unusual behavior or acute distress. Patient with no verbalized needs or c/o at this time.... will continue to monitor and follow up as needed. Security staff monitoring patient on camera system. Q15 min rounds done by EDT.   

## 2019-09-08 ENCOUNTER — Ambulatory Visit: Payer: Self-pay | Admitting: Gerontology

## 2019-09-08 NOTE — BH Assessment (Signed)
Referral information for Psychiatric Hospitalization faxed to;    [facility] [phone]),   [facility] [phone] or [phone])   [facility] [phone] -or- [phone]),    [facility] [phone]).

## 2019-09-08 NOTE — ED Notes (Signed)
Patient resting comfortably in room. No complaints or concerns voiced. No distress or abnormal behavior noted. Will continue to monitor with security cameras. Q 15 minute rounds continue. 

## 2019-09-08 NOTE — ED Notes (Signed)
Hourly rounding reveals patient in room. No complaints, stable, in no acute distress. Q15 minute rounds and monitoring via Security Cameras to continue. 

## 2019-09-08 NOTE — ED Notes (Signed)
Report to include Situation, Background, Assessment, and Recommendations received from RN. Patient alert and oriented, warm and dry, in no acute distress. Patient denies SI, HI, AVH and pain. Patient made aware of Q15 minute rounds and security cameras for their safety. Patient instructed to come to me with needs or concerns.  

## 2019-09-08 NOTE — BH Assessment (Signed)
Writer spoke with the patient to complete an updated/reassessment. Patient continues to voice SI and symptoms of depression.

## 2019-09-08 NOTE — ED Notes (Signed)
Pt received snack and is resting in bed at this time with no complaints

## 2019-09-08 NOTE — ED Notes (Signed)
Pt reports homicidal ideation towards mother, "that bitch needs to be taken out of her misery"  NP on duty notified via secure message.

## 2019-09-08 NOTE — ED Notes (Signed)
VOL  PENDING  PLACEMENT 

## 2019-09-08 NOTE — ED Provider Notes (Signed)
Emergency Medicine Observation Re-evaluation Note  ABELINO TIPPIN is a 39 y.o. male, seen on rounds today.  Pt initially presented to the ED for complaints of Psychiatric Evaluation Currently, the patient is resting.  Physical Exam  BP 115/79   Pulse 87   Temp 98.6 F (37 C) (Oral)   Resp 19   Ht 5\' 9"  (1.753 m)   Wt 73.9 kg   SpO2 98%   BMI 24.07 kg/m  Physical Exam  Gen: Calm, in NAD, resting in psych scrubs HEENT: NCAT CV: Well perfused Resp: Normal work of breathing, no apparent resp distress Neuro: Non-focal, no apparent deficits, at mental baseline Psych: Calm, resting ED Course / MDM  EKG:    I have reviewed the labs performed to date as well as medications administered while in observation.  Recent changes in the last 24 hours include none. Plan  Current plan is for psych dispo. Patient is not under full IVC at this time.   , MD 09/08/19 [phone]

## 2019-09-09 ENCOUNTER — Other Ambulatory Visit: Payer: Self-pay

## 2019-09-09 ENCOUNTER — Inpatient Hospital Stay
Admission: AD | Admit: 2019-09-09 | Discharge: 2019-09-17 | DRG: 885 | Disposition: A | Discharge status: Home / Self Care | Payer: No Typology Code available for payment source | Source: Intra-hospital | Attending: Internal Medicine | Admitting: Internal Medicine

## 2019-09-09 DIAGNOSIS — F10239 Alcohol dependence with withdrawal, unspecified: Secondary | ICD-10-CM | POA: Diagnosis present

## 2019-09-09 DIAGNOSIS — F4010 Social phobia, unspecified: Secondary | ICD-10-CM | POA: Diagnosis present

## 2019-09-09 DIAGNOSIS — Z20822 Contact with and (suspected) exposure to covid-19: Secondary | ICD-10-CM | POA: Diagnosis present

## 2019-09-09 DIAGNOSIS — F411 Generalized anxiety disorder: Secondary | ICD-10-CM | POA: Diagnosis present

## 2019-09-09 DIAGNOSIS — Z87442 Personal history of urinary calculi: Secondary | ICD-10-CM

## 2019-09-09 DIAGNOSIS — Q211 Atrial septal defect: Secondary | ICD-10-CM

## 2019-09-09 DIAGNOSIS — F609 Personality disorder, unspecified: Secondary | ICD-10-CM | POA: Diagnosis present

## 2019-09-09 DIAGNOSIS — F1020 Alcohol dependence, uncomplicated: Secondary | ICD-10-CM | POA: Diagnosis present

## 2019-09-09 DIAGNOSIS — Z880 Allergy status to penicillin: Secondary | ICD-10-CM | POA: Diagnosis not present

## 2019-09-09 DIAGNOSIS — F319 Bipolar disorder, unspecified: Secondary | ICD-10-CM | POA: Diagnosis present

## 2019-09-09 DIAGNOSIS — I10 Essential (primary) hypertension: Secondary | ICD-10-CM | POA: Diagnosis present

## 2019-09-09 DIAGNOSIS — Z590 Homelessness: Secondary | ICD-10-CM | POA: Diagnosis not present

## 2019-09-09 DIAGNOSIS — F3160 Bipolar disorder, current episode mixed, unspecified: Secondary | ICD-10-CM | POA: Diagnosis present

## 2019-09-09 DIAGNOSIS — F17210 Nicotine dependence, cigarettes, uncomplicated: Secondary | ICD-10-CM | POA: Diagnosis present

## 2019-09-09 DIAGNOSIS — G8929 Other chronic pain: Secondary | ICD-10-CM | POA: Diagnosis present

## 2019-09-09 DIAGNOSIS — F4320 Adjustment disorder, unspecified: Secondary | ICD-10-CM | POA: Diagnosis present

## 2019-09-09 DIAGNOSIS — R45851 Suicidal ideations: Secondary | ICD-10-CM | POA: Diagnosis present

## 2019-09-09 LAB — SARS CORONAVIRUS 2 BY RT PCR (HOSPITAL ORDER, PERFORMED IN ~~LOC~~ HOSPITAL LAB): SARS Coronavirus 2: NEGATIVE

## 2019-09-09 MED ORDER — ALUM & MAG HYDROXIDE-SIMETH 200-200-20 MG/5ML PO SUSP: 30.0000 mL | ORAL | Status: DC | PRN

## 2019-09-09 MED ORDER — THIAMINE HCL 100 MG PO TABS
100.0000 mg | ORAL_TABLET | Freq: Every day | ORAL | Status: DC
  Administered 2019-09-10 – 2019-09-17 (×8): 100 mg via ORAL
  Filled 2019-09-09 (×8): qty 1

## 2019-09-09 MED ORDER — DULOXETINE HCL 30 MG PO CPEP
30.0000 mg | ORAL_CAPSULE | Freq: Every day | ORAL | Status: DC
  Administered 2019-09-10 – 2019-09-11 (×2): 30 mg via ORAL
  Filled 2019-09-09 (×2): qty 1

## 2019-09-09 MED ORDER — ACETAMINOPHEN 325 MG PO TABS
650.0000 mg | ORAL_TABLET | Freq: Four times a day (QID) | ORAL | Status: DC | PRN
  Administered 2019-09-10 – 2019-09-14 (×5): 650 mg via ORAL
  Filled 2019-09-09 (×7): qty 2

## 2019-09-09 MED ORDER — OLANZAPINE 5 MG PO TBDP
5.0000 mg | ORAL_TABLET | Freq: Every day | ORAL | Status: DC
  Administered 2019-09-09 – 2019-09-10 (×2): 5 mg via ORAL
  Filled 2019-09-09 (×3): qty 1

## 2019-09-09 MED ORDER — THIAMINE HCL 100 MG/ML IJ SOLN
100.0000 mg | Freq: Every day | INTRAMUSCULAR | Status: DC
  Filled 2019-09-09: qty 2

## 2019-09-09 MED ORDER — NICOTINE 21 MG/24HR TD PT24
21.0000 mg | MEDICATED_PATCH | Freq: Every day | TRANSDERMAL | Status: DC
  Administered 2019-09-10 – 2019-09-17 (×8): 21 mg via TRANSDERMAL
  Filled 2019-09-09 (×8): qty 1

## 2019-09-09 MED ORDER — MELATONIN 5 MG PO TABS
5.0000 mg | ORAL_TABLET | Freq: Every day | ORAL | Status: DC
  Administered 2019-09-09 – 2019-09-11 (×3): 5 mg via ORAL
  Filled 2019-09-09 (×3): qty 1

## 2019-09-09 MED ORDER — GABAPENTIN 300 MG PO CAPS
300.0000 mg | ORAL_CAPSULE | Freq: Three times a day (TID) | ORAL | Status: DC
  Administered 2019-09-10 – 2019-09-16 (×20): 300 mg via ORAL
  Filled 2019-09-09 (×19): qty 1

## 2019-09-09 MED ORDER — KETOROLAC TROMETHAMINE 10 MG PO TABS
10.0000 mg | ORAL_TABLET | Freq: Four times a day (QID) | ORAL | Status: AC | PRN
  Filled 2019-09-09: qty 1

## 2019-09-09 MED ORDER — MAGNESIUM HYDROXIDE 400 MG/5ML PO SUSP: 30.0000 mL | Freq: Every day | ORAL | Status: DC | PRN

## 2019-09-09 MED ORDER — DIVALPROEX SODIUM 500 MG PO DR TAB
1000.0000 mg | DELAYED_RELEASE_TABLET | Freq: Two times a day (BID) | ORAL | Status: DC
  Administered 2019-09-09 – 2019-09-17 (×17): 1000 mg via ORAL
  Filled 2019-09-09 (×17): qty 2

## 2019-09-09 NOTE — ED Notes (Signed)
Pt. Was given a dinner tray and a drink. °
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
## 2019-09-09 NOTE — BH Assessment (Signed)
PATIENT BED AVAILABLE AFTER 9:30AM  Patient is to be admitted to [facility] by Psychiatric Nurse Practitioner Gillermo Murdoch.  Attending Physician will be Dr. Toni Amend.   Patient has been assigned to room 320, by [facility] Charge Nurse Lamar.     ER staff is aware of the admission:  Carlane ER Secretary    Dr. Larinda Buttery, ER MD   Onalee Hua Patient's Nurse   Rosey Bath Patient Access.

## 2019-09-09 NOTE — ED Notes (Signed)
Pt given warm wipes and clean scrubs.

## 2019-09-09 NOTE — BH Assessment (Addendum)
Referral information for Psychiatric Hospitalization faxed to;    [facility] ([phone]---[phone]), No current intake staff available until after [facility] [phone] or [phone]) No answer   [facility] [phone] -[phone]), Denied due to no insurance   [facility] [phone]). No answer, voicemail was left

## 2019-09-09 NOTE — ED Notes (Signed)
Hourly rounding reveals patient in room. No complaints, stable, in no acute distress. Q15 minute rounds and monitoring via Security Cameras to continue. 

## 2019-09-09 NOTE — Plan of Care (Signed)
NEW ADMISSION:   Problem: Education: Goal: Knowledge of [facility] Education information/materials will improve Outcome: Not Progressing Goal: Emotional status will improve Outcome: Not Progressing Goal: Mental status will improve Outcome: Not Progressing Goal: Verbalization of understanding the information provided will improve Outcome: Not Progressing   Problem: Activity: Goal: Interest or engagement in activities will improve Outcome: Not Progressing Goal: Sleeping patterns will improve Outcome: Not Progressing   Problem: Coping: Goal: Ability to verbalize frustrations and anger appropriately will improve Outcome: Not Progressing Goal: Ability to demonstrate self-control will improve Outcome: Not Progressing   Problem: Health Behavior/Discharge Planning: Goal: Identification of resources available to assist in meeting health care needs will improve Outcome: Not Progressing Goal: Compliance with treatment plan for underlying cause of condition will improve Outcome: Not Progressing   Problem: Physical Regulation: Goal: Ability to maintain clinical measurements within normal limits will improve Outcome: Not Progressing   Problem: Safety: Goal: Periods of time without injury will increase Outcome: Not Progressing   Problem: Education: Goal: Knowledge of disease or condition will improve Outcome: Not Progressing Goal: Understanding of discharge needs will improve Outcome: Not Progressing   Problem: Health Behavior/Discharge Planning: Goal: Ability to identify changes in lifestyle to reduce recurrence of condition will improve Outcome: Not Progressing Goal: Identification of resources available to assist in meeting health care needs will improve Outcome: Not Progressing   Problem: Physical Regulation: Goal: Complications related to the disease process, condition or treatment will be avoided or minimized Outcome: Not Progressing   Problem: Safety: Goal:  Ability to remain free from injury will improve Outcome: Not Progressing   Problem: Education: Goal: Ability to make informed decisions regarding treatment will improve Outcome: Not Progressing   Problem: Coping: Goal: Coping ability will improve Outcome: Not Progressing   Problem: Health Behavior/Discharge Planning: Goal: Identification of resources available to assist in meeting health care needs will improve Outcome: Not Progressing   Problem: Medication: Goal: Compliance with prescribed medication regimen will improve Outcome: Not Progressing   Problem: Self-Concept: Goal: Ability to disclose and discuss suicidal ideas will improve Outcome: Not Progressing Goal: Will verbalize positive feelings about self Outcome: Not Progressing
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
## 2019-09-10 NOTE — H&P (Signed)
Psychiatric Admission Assessment Adult  Patient Identification: John [name] J Plain MRN:  [id number] Date of Evaluation:  09/10/2019 Chief Complaint:  Bipolar 1 disorder (HCC) [F31.9] Principal Diagnosis: <principal problem not specified> Diagnosis:  Active Problems:   Bipolar 1 disorder (HCC) Alcohol dependence Personality disorder NOS --antisocial dependent     History of Present Illness:   Revolving door admits for ETOH however this time feels more depressed and has SI with possible plans  Somewhat unclear his motives but looks more depressed and haggard than usual   Now here for dual diagnosis management Seen by me in Er consult   Ongoing     Associated Signs/Symptoms:   Depressive symptoms, veg signs  Anhedonia, slow movements lack of sleep SI ---mixed with anxiety and depression.   Ongoing sequelae of ETOH recovery      Past Psychiatric History:   Multiple ER visits but no recent recovery or psych admits.  Chronically homeless has burned bridges at many centers for staying for recovery due to personality and issues related   Risk ---vague risk to Self now.  None recently ---ongoing mainly ETOH issues --where he has no motivation for recovery   Past no recent Si attempts  Depression however increasins Homeless feels discouraged     Prior Inpatient Therapy:  none recently  Prior Outpatient Therapy:   none recently   Alcohol Screening: 1. How often do you have a drink containing alcohol?: 2 to 4 times a month 2. How many drinks containing alcohol do you have on a typical day when you are drinking?: 5 or 6 3. How often do you have six or more drinks on one occasion?: Monthly AUDIT-C Score: 6 4. How often during the last year have you found that you were not able to stop drinking once you had started?: Never 5. How often during the last year have you failed to do what was normally expected from you because of drinking?: Never 6. How often during the last year have  you needed a first drink in the morning to get yourself going after a heavy drinking session?: Never 7. How often during the last year have you had a feeling of guilt of remorse after drinking?: Never 8. How often during the last year have you been unable to remember what happened the night before because you had been drinking?: Never 9. Have you or someone else been injured as a result of your drinking?: No 10. Has a relative or friend or a doctor or another health worker been concerned about your drinking or suggested you cut down?: No Alcohol Use Disorder Identification Test Final Score (AUDIT): 6 Alcohol Brief Interventions/Follow-up: AUDIT Score <7 follow-up not indicated Substance Abuse History in the last 12 months:  yes Consequences of Substance Abuse:  Poor coping and social skills    Previous Psychotropic Medications: not recent Psychological Evaluations: not recent Past Medical History:  Past Medical History:  Diagnosis Date   ASD (atrial septal defect)    Bacterial infection due to H. pylori    Hypertension    Kidney stones     Past Surgical History:  Procedure Laterality Date   ASD REPAIR     FOOT SURGERY Right    KNEE SURGERY Right    Family History: History reviewed. No pertinent family history. Family Psychiatric  History:   Estranged from parents who have depression and anxiety    Tobacco Screening: Have you used any form of tobacco in the last 30 days? (Cigarettes, Smokeless Tobacco, Cigars,  and/or Pipes): Yes Tobacco use, Select all that apply: 5 or more cigarettes per day Are you interested in Tobacco Cessation Medications?: No, patient refused Counseled patient on smoking cessation including recognizing danger situations, developing coping skills and basic information about quitting provided: Refused/Declined practical counseling Social History:  Social History   Substance and Sexual Activity  Alcohol Use Not Currently   Comment: Last consumed  Friday 06/10/19, pt states he is "an alcoholic" and "wants to drink"     Social History   Substance and Sexual Activity  Drug Use Not Currently    Additional Social History: Marital status: Single Are you sexually active?: No What is your sexual orientation?: Straight Has your sexual activity been affected by drugs, alcohol, medication, or emotional stress?: Not at this time Does patient have children?: Yes (Twins. Boy and Girl) How many children?: 2 (Daughter passed when she was 78 years old) How is patient's relationship with their children?: Use to see son. Dad moved his son and does not get to see him.                         Allergies:   Allergies  Allergen Reactions   Penicillins     Did it involve swelling of the face/tongue/throat, SOB, or low BP? No Did it involve sudden or severe rash/hives, skin peeling, or any reaction on the inside of your mouth or nose? No Did you need to seek medical attention at a hospital or doctor's office? Unknown When did it last happen? If all above answers are NO, may proceed with cephalosporin use.   Lab Results:  Results for orders placed or performed during the hospital encounter of 09/05/19 (from the past 48 hour(s))  SARS Coronavirus 2 by RT PCR (hospital order, performed in [facility] hospital lab) Nasopharyngeal Nasopharyngeal Swab     Status: None   Collection Time: 09/09/19  1:07 PM   Specimen: Nasopharyngeal Swab  Result Value Ref Range   SARS Coronavirus 2 NEGATIVE NEGATIVE    Comment: (NOTE) SARS-CoV-2 target nucleic acids are NOT DETECTED.  The SARS-CoV-2 RNA is generally detectable in upper and lower respiratory specimens during the acute phase of infection. The lowest concentration of SARS-CoV-2 viral copies this assay can detect is 250 copies / mL. A negative result does not preclude SARS-CoV-2 infection and should not be used as the sole basis for treatment or other patient management decisions.  A  negative result may occur with improper specimen collection / handling, submission of specimen other than nasopharyngeal swab, presence of viral mutation(s) within the areas targeted by this assay, and inadequate number of viral copies (<250 copies / mL). A negative result must be combined with clinical observations, patient history, and epidemiological information.  Fact Sheet for Patients:   BoilerBrush.com.cy  Fact Sheet for Healthcare Providers: https://pope.com/  This test is not yet approved or  cleared by the [place] FDA and has been authorized for detection and/or diagnosis of SARS-CoV-2 by FDA under an Emergency Use Authorization (EUA).  This EUA will remain in effect (meaning this test can be used) for the duration of the COVID-19 declaration under Section 564(b)(1) of the Act, 21 U.S.C. section 360bbb-3(b)(1), unless the authorization is terminated or revoked sooner.  Performed at [facility], [address] [place], [place] [address]     Blood Alcohol level:  Lab Results  Component Value Date   ETH 212 (H) 09/05/2019   ETH 167 (H) 09/01/2019  Metabolic Disorder Labs:  No results found for: HGBA1C, MPG No results found for: PROLACTIN No results found for: CHOL, TRIG, HDL, CHOLHDL, VLDL, LDLCALC  Current Medications: Current Facility-Administered Medications  Medication Dose Route Frequency Provider Last Rate Last Admin   acetaminophen (TYLENOL) tablet 650 mg  650 mg Oral Q6H PRN Gillermo Murdoch, NP       alum & mag hydroxide-simeth (MAALOX/MYLANTA) 200-200-20 MG/5ML suspension 30 mL  30 mL Oral Q4H PRN Gillermo Murdoch, NP       divalproex (DEPAKOTE) DR tablet 1,000 mg  1,000 mg Oral BID Gillermo Murdoch, NP   1,000 mg at 09/10/19 0960   DULoxetine (CYMBALTA) DR capsule 30 mg  30 mg Oral Daily Gillermo Murdoch, NP   30 mg at 09/10/19 4540   gabapentin (NEURONTIN) capsule  300 mg  300 mg Oral TID Gillermo Murdoch, NP   300 mg at 09/10/19 1237   ketorolac (TORADOL) tablet 10 mg  10 mg Oral Q6H PRN Gillermo Murdoch, NP       magnesium hydroxide (MILK OF MAGNESIA) suspension 30 mL  30 mL Oral Daily PRN Gillermo Murdoch, NP       melatonin tablet 5 mg  5 mg Oral QHS Gillermo Murdoch, NP   5 mg at 09/09/19 2112   nicotine (NICODERM CQ - dosed in mg/24 hours) patch 21 mg  21 mg Transdermal Daily Gillermo Murdoch, NP   21 mg at 09/10/19 0822   OLANZapine zydis (ZYPREXA) disintegrating tablet 5 mg  5 mg Oral QHS Gillermo Murdoch, NP   5 mg at 09/09/19 2112   thiamine tablet 100 mg  100 mg Oral Daily Gillermo Murdoch, NP   100 mg at 09/10/19 9811   Or   thiamine (B-1) injection 100 mg  100 mg Intravenous Daily Gillermo Murdoch, NP       PTA Medications: Medications Prior to Admission  Medication Sig Dispense Refill Last Dose   divalproex (DEPAKOTE) 500 MG DR tablet Take 1 tablet (500 mg total) by mouth 2 (two) times daily. 60 tablet 0    gabapentin (NEURONTIN) 300 MG capsule Take 1 capsule (300 mg total) by mouth 3 (three) times daily. (Patient not taking: Reported on 09/02/2019) 90 capsule 2    ketorolac (TORADOL) 10 MG tablet Take 1 tablet (10 mg total) by mouth every 6 (six) hours as needed. (Patient not taking: Reported on 09/02/2019) 12 tablet 0    melatonin 5 MG TABS Take 5 mg by mouth at bedtime. (Patient not taking: Reported on 09/05/2019)        Mental Status  Alert somewhat cooperative slow and heavy Mood depressed affect constricted Anxious  No shakes and tremors for movement Has passive SI no plan No HI Judgement insight reliability all poor Intelligence fund of knowledge below average Oriented times four Consciousness not clouded  Concentration improving Memory --remote recent and immediate okay through general questions Abstraction okay Thought process and content --no frank psychosis or mania Sleep  improving ADL's okay Cognition not at par when intoxicated Assets --seeks help  Liabilities --poor coping and social skills no motivation for real recovery homeless                        Treatment Plan Summary:   Remains on unit for dual diagnosis stabilization ----and recovery   Observation Level/Precautions:  15 minute checks  Laboratory:    Psychotherapy:    Medications:    Consultations:    Discharge Concerns:  Estimated LOS:  7  Other:  Med mgt case mgt groups milieu treatment team    Physician Treatment Plan for Primary Diagnosis: <principal problem not specified> Long Term Goal(s):Short Term Goals:   Balance mood improve coping, motivate for 1/2 way house    Physician Treatment Plan for Secondary Diagnosis: Active Problems:   Bipolar 1 disorder (HCC)  Long Term Goal(s): same   Short Term Goals:  I certify that inpatient services furnished can reasonably be expected to improve the patient's condition.    Roselind Messier, MD 7/17/20211:34 PM

## 2019-09-10 NOTE — [facility] Suicide Risk Assessment (Signed)
[facility] INPATIENT:  Family/Significant Other Suicide Prevention Education  Suicide Prevention Education:  Patient Refusal for Family/Significant Other Suicide Prevention Education: The patient John [name] has refused to provide written consent for family/significant other to be provided Family/Significant Other Suicide Prevention Education during admission and/or prior to discharge.  Physician notified.  CSW reviewed SPE with patient. Patient received a pamphlet regarding risks, warning signs, and when and where to get help.    Carollee Herter  [place] 09/10/2019, 11:33 AM

## 2019-09-10 NOTE — [facility] Suicide Risk Assessment (Signed)
[facility] Admission Suicide Risk Assessment   Nursing information obtained from:  Patient Demographic factors:  Male, Caucasian, Low socioeconomic status, Living alone, Unemployed Current Mental Status:  Suicidal ideation indicated by patient Loss Factors:  Decline in physical health Historical Factors:  Impulsivity Risk Reduction Factors:  NA  Total Time spent with patient:   45 or so min   Principal Problem: <principal problem not specified> Diagnosis:  Active Problems:   Bipolar 1 disorder (HCC)  Subjective Data:   Ongoing chronic pattern of ETOH and depression and homeless disheveled state --no supports place him at risk  Continued Clinical Symptoms:  Alcohol Use Disorder Identification Test Final Score (AUDIT): 6 The "Alcohol Use Disorders Identification Test", Guidelines for Use in Primary Care, Second Edition.  World Science writer [facility]). Score between 0-7:  no or low risk or alcohol related problems. Score between 8-15:  moderate risk of alcohol related problems. Score between 16-19:  high risk of alcohol related problems. Score 20 or above:  warrants further diagnostic evaluation for alcohol dependence and treatment.   CLINICAL FACTORS:   Homeless no supports  ETOH history   --no job or goals  Dual depression issues      Psychiatric Specialty Exam: Physical Exam  Review of Systems  Blood pressure (!) 135/94, pulse 90, temperature 98.7 F (37.1 C), temperature source Oral, resp. rate 14, height 5\' 9"  (1.753 m), weight 73.9 kg, SpO2 98 %.Body mass index is 24.06 kg/m.  Already done in initial eval                                     Already done in [facility] eval                     COGNITIVE FEATURES THAT CONTRIBUTE TO RISK:  See above   Poor coping and social skills lack of motivation for recovery personality traits that manipulate himself and other    SUICIDE RISK:    Low to moderate risk at this time     PLAN OF CARE:    Med mgt, CW interventions, discharge planning treatment team groups  Encourage 1/2 way house at discharge  Sobriety issues       I certify that inpatient services furnished can reasonably be expected to improve the patient's condition.   , MD 09/10/2019, 4:01 PM
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
## 2019-09-10 NOTE — Progress Notes (Signed)
Pt is alert and oriented to person, place, time and situation. Pt is calm, cooperative, denies suicidal and homicidal ideation, denies hallucinations, denies feelings of depression and anxiety. Pt's affect is flat, eye contact is fair, pt is hypo-verbal, spends time resting in his room quietly, is medication compliant, appetite is good, reports he had a good nights sleep. No distress noted, none reported, pt voices no complaints. Will continue to monitor pt per Q15 minute face checks and monitor for safety and progress.

## 2019-09-11 MED ORDER — DULOXETINE HCL 20 MG PO CPEP
40.0000 mg | ORAL_CAPSULE | Freq: Every day | ORAL | Status: DC
  Administered 2019-09-12 – 2019-09-14 (×3): 40 mg via ORAL
  Filled 2019-09-11 (×3): qty 2

## 2019-09-11 MED ORDER — OLANZAPINE 5 MG PO TBDP
7.5000 mg | ORAL_TABLET | Freq: Every day | ORAL | Status: DC
  Administered 2019-09-11 – 2019-09-13 (×3): 7.5 mg via ORAL
  Filled 2019-09-11 (×4): qty 2

## 2019-09-11 NOTE — [facility] Group Notes (Signed)
[facility] LCSW Group Therapy Note  Date/Time:  09/11/2019 1:54 PM- 2:44 PM   Type of Therapy and Topic:  Group Therapy:  Healthy and Unhealthy Supports  Participation Level:  Minimal   Description of Group:  Patients in this group were introduced to the idea of adding a variety of healthy supports to address the various needs in their lives.Patients discussed what additional healthy supports could be helpful in their recovery and wellness after discharge in order to prevent future hospitalizations.   An emphasis was placed on using counselor, doctor, therapy groups, 12-step groups, and problem-specific support groups to expand supports.  They also worked as a group on developing a specific plan for several patients to deal with unhealthy supports through boundary-setting, psychoeducation with loved ones, and even termination of relationships.   Therapeutic Goals:   1)  discuss importance of adding supports to stay well once out of the hospital  2)  compare healthy versus unhealthy supports and identify some examples of each  3)  generate ideas and descriptions of healthy supports that can be added  4)  offer mutual support about how to address unhealthy supports  5)  encourage active participation in and adherence to discharge plan    Summary of Patient Progress:  Patient checked in today stating he had a headache and a craving to drink alcohol. Patient was not able to think of his healthy supports. Patient spoke about how his friends would be unhealthy because they would encourage him to drink. Patient was quiet the rest of group and did not talk about what healthy supports he could add when he is discharged. Patient also could not speak about why supports were important.    Therapeutic Modalities:   Motivational Interviewing Brief Solution-Focused Therapy  Susa Simmonds, Theresia Majors 09/11/2019  3:27 PM

## 2019-09-11 NOTE — Progress Notes (Signed)
Patient ID: John [name], male   DOB: 1980-05-11, 39 y.o.   MRN: [id number]      [facility] MD Progress Note  09/11/2019 1:53 PM John [name]  MRN:  [id number] Principal Psychiatric Diagnosis: <principal problem not specified> Diagnosis: Active Problems:   Bipolar 1 disorder (HCC)   ETOH dependence Personality disorder NOS  PTSD Adjustment disorder Homeless   Total Time spent with patient: DAILY NARRATIVE  Subjective:  I need more time  Objective:  More animated almost at baseline, invested in victim role says he cannot work and go to [employer]  meds being titrated still   Am Depakote level pending    PHYSICAL EXAM  Physical Exam  SYSTEMS REVIEW  Review of Systems  MENTAL STATUS   Alert somewhat cooperative male Oriented to person place and time \\slow  to process Mood depressed affect okay No frank psychosis or mania for thought process and content Judgement insight reliability fair to poor No shakes and tremors for movements ADL's okay Sleep and appetite normal  No active SI or HI now  Contracts for safety  Speech --normal rate tone volume fluency Judgement insight reliability fair to poor Fund of knowledge and intelligence below average Abstraction okay   Cognition --slower than normal  ADL's okay  Movement problems none   Current Medications: Current Facility-Administered Medications  Medication Dose Route Frequency Provider Last Rate Last Admin  . acetaminophen (TYLENOL) tablet 650 mg  650 mg Oral Q6H PRN Gillermo Murdoch, NP   650 mg at 09/10/19 2143  . alum & mag hydroxide-simeth (MAALOX/MYLANTA) 200-200-20 MG/5ML suspension 30 mL  30 mL Oral Q4H PRN Gillermo Murdoch, NP      . divalproex (DEPAKOTE) DR tablet 1,000 mg  1,000 mg Oral BID Gillermo Murdoch, NP   1,000 mg at 09/11/19 0734  . [START ON 09/12/2019] DULoxetine (CYMBALTA) DR capsule 40 mg  40 mg Oral Daily Roselind Messier, MD      . gabapentin (NEURONTIN) capsule 300 mg  300  mg Oral TID Gillermo Murdoch, NP   300 mg at 09/11/19 1238  . magnesium hydroxide (MILK OF MAGNESIA) suspension 30 mL  30 mL Oral Daily PRN Gillermo Murdoch, NP      . melatonin tablet 5 mg  5 mg Oral QHS Gillermo Murdoch, NP   5 mg at 09/10/19 2143  . nicotine (NICODERM CQ - dosed in mg/24 hours) patch 21 mg  21 mg Transdermal Daily Gillermo Murdoch, NP   21 mg at 09/11/19 0736  . OLANZapine zydis (ZYPREXA) disintegrating tablet 7.5 mg  7.5 mg Oral QHS Roselind Messier, MD      . thiamine tablet 100 mg  100 mg Oral Daily Gillermo Murdoch, NP   100 mg at 09/11/19 5681   Or  . thiamine (B-1) injection 100 mg  100 mg Intravenous Daily Gillermo Murdoch, NP        Lab Results: No results found for this or any previous visit (from the past 48 hour(s)).  Blood Alcohol level:  Lab Results  Component Value Date   ETH 212 (H) 09/05/2019   ETH 167 (H) 09/01/2019    Metabolic Disorder Labs: No results found for: HGBA1C, MPG No results found for: PROLACTIN No results found for: CHOL, TRIG, HDL, CHOLHDL, VLDL, LDLCALC   Past Psychiatric History:   Past Medical/Surgical History  Past Medical History:  Diagnosis Date  . ASD (atrial septal defect)   . Bacterial infection due to H. pylori   . Hypertension   .  Kidney stones     Past Surgical History:  Procedure Laterality Date  . ASD REPAIR    . FOOT SURGERY Right   . KNEE SURGERY Right     Family Medical and Psychiatric History:  History reviewed. No pertinent family history.  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 Marital status: Single    Spouse name: Not on file  . Number of children: Not on file  . Years of education: Not on file   . Highest education level: Not on file  Occupational History  . Not on file  Tobacco Use  . Smoking status: Current Every Day Smoker    Types: Cigarettes  . Smokeless tobacco: Never Used  . Tobacco comment: down to 2 cigarettes a day  Vaping Use  . Vaping Use: Never used  Substance and Sexual Activity  . 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 Drug use: Not Currently  . Sexual activity: Yes    Birth control/protection: None  Other Topics Concern  . Not on file  Social History Narrative  . Not on file   Social Determinants of Health   Financial Resource Strain: High Risk  . Difficulty of Paying Living Expenses: Very hard  Food Insecurity: No Food Insecurity  . Worried About Programme researcher, broadcasting/film/video in the Last Year: Never true  . Ran Out of Food in the Last Year: Never true  Transportation Needs: Unmet Transportation Needs  . Lack of Transportation (Medical): Yes  . Lack of Transportation (Non-Medical): Yes  Physical Activity:   . Days of Exercise per Week:   . Minutes of Exercise per Session:   Stress:   . Feeling of Stress :   Social Connections: Moderately Isolated  . Frequency of Communication with Friends and Family: Never  . Frequency of Social Gatherings with Friends and Family: Never  . Attends Religious Services: 1 to 4 times per year  . Active Member of Clubs or Organizations: Yes  . Attends Banker Meetings: 1 to 4 times per year  . 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

## 2019-09-11 NOTE — Progress Notes (Signed)
Pt is alert and oriented to person, place, time and situation. Pt is calm, cooperative, pleasant, polite, denies suicidal and homicidal ideation. Pt is medication complaint. Pt spends a good deal of his day talking with peers in the dayroom and watching tv. No distress noted, none reported, pt voices no complaints. Will continue to monitor pt per Q15 minute face checks and monitor for safety and progress.

## 2019-09-11 NOTE — Plan of Care (Signed)
Cooperative with treatment, he was pleasant on approach , he spent most of the evening in the dayroom  with peers. No behavioral issues to report on shift at this time. He appears to be in bed resting quietly at this time.

## 2019-09-11 NOTE — Tx Team (Signed)
Interdisciplinary Treatment and Diagnostic Plan Update  09/11/2019 Time of Session: 12:06 PM John [name] MRN: [id number]  Principal Diagnosis: <principal problem not specified>  Secondary Diagnoses: Active Problems:   Bipolar 1 disorder (HCC)   Current Medications:  Current Facility-Administered Medications  Medication Dose Route Frequency Provider Last Rate Last Admin  . acetaminophen (TYLENOL) tablet 650 mg  650 mg Oral Q6H PRN Gillermo Murdoch, NP   650 mg at 09/10/19 2143  . alum & mag hydroxide-simeth (MAALOX/MYLANTA) 200-200-20 MG/5ML suspension 30 mL  30 mL Oral Q4H PRN Gillermo Murdoch, NP      . divalproex (DEPAKOTE) DR tablet 1,000 mg  1,000 mg Oral BID Gillermo Murdoch, NP   1,000 mg at 09/11/19 0734  . DULoxetine (CYMBALTA) DR capsule 30 mg  30 mg Oral Daily Gillermo Murdoch, NP   30 mg at 09/11/19 0734  . gabapentin (NEURONTIN) capsule 300 mg  300 mg Oral TID Gillermo Murdoch, NP   300 mg at 09/11/19 1238  . magnesium hydroxide (MILK OF MAGNESIA) suspension 30 mL  30 mL Oral Daily PRN Gillermo Murdoch, NP      . melatonin tablet 5 mg  5 mg Oral QHS Gillermo Murdoch, NP   5 mg at 09/10/19 2143  . nicotine (NICODERM CQ - dosed in mg/24 hours) patch 21 mg  21 mg Transdermal Daily Gillermo Murdoch, NP   21 mg at 09/11/19 0736  . OLANZapine zydis (ZYPREXA) disintegrating tablet 5 mg  5 mg Oral QHS Gillermo Murdoch, NP   5 mg at 09/10/19 2143  . thiamine tablet 100 mg  100 mg Oral Daily Gillermo Murdoch, NP   100 mg at 09/11/19 1062   Or  . thiamine (B-1) injection 100 mg  100 mg Intravenous Daily Gillermo Murdoch, NP       PTA Medications: Medications Prior to Admission  Medication Sig Dispense Refill Last Dose  . divalproex (DEPAKOTE) 500 MG DR tablet Take 1 tablet (500 mg total) by mouth 2 (two) times daily. 60 tablet 0   . gabapentin (NEURONTIN) 300 MG capsule Take 1 capsule (300 mg total) by mouth 3 (three) times daily.  (Patient not taking: Reported on 09/02/2019) 90 capsule 2   . ketorolac (TORADOL) 10 MG tablet Take 1 tablet (10 mg total) by mouth every 6 (six) hours as needed. (Patient not taking: Reported on 09/02/2019) 12 tablet 0   . melatonin 5 MG TABS Take 5 mg by mouth at bedtime. (Patient not taking: Reported on 09/05/2019)       Patient Stressors: Financial difficulties Health problems Medication change or noncompliance Substance abuse Traumatic event  Patient Strengths: Active sense of humor Capable of independent living Communication skills  Treatment Modalities: Medication Management, Group therapy, Case management,  1 to 1 session with clinician, Psychoeducation, Recreational therapy.   Physician Treatment Plan for Primary Diagnosis: <principal problem not specified> Long Term Goal(s):     Short Term Goals:    Medication Management: Evaluate patient's response, side effects, and tolerance of medication regimen.  Therapeutic Interventions: 1 to 1 sessions, Unit Group sessions and Medication administration.  Evaluation of Outcomes: Not Progressing  Physician Treatment Plan for Secondary Diagnosis: Active Problems:   Bipolar 1 disorder (HCC)  Long Term Goal(s):     Short Term Goals:       Medication Management: Evaluate patient's response, side effects, and tolerance of medication regimen.  Therapeutic Interventions: 1 to 1 sessions, Unit Group sessions and Medication administration.  Evaluation of Outcomes: Not Progressing  RN Treatment Plan for Primary Diagnosis: <principal problem not specified> Long Term Goal(s): Knowledge of disease and therapeutic regimen to maintain health will improve  Short Term Goals: Ability to remain free from injury will improve, Ability to verbalize frustration and anger appropriately will improve, Ability to demonstrate self-control, Ability to participate in decision making will improve, Ability to verbalize feelings will improve, Ability to  disclose and discuss suicidal ideas, Ability to identify and develop effective coping behaviors will improve and Compliance with prescribed medications will improve  Medication Management: RN will administer medications as ordered by provider, will assess and evaluate patient's response and provide education to patient for prescribed medication. RN will report any adverse and/or side effects to prescribing provider.  Therapeutic Interventions: 1 on 1 counseling sessions, Psychoeducation, Medication administration, Evaluate responses to treatment, Monitor vital signs and CBGs as ordered, Perform/monitor CIWA, COWS, AIMS and Fall Risk screenings as ordered, Perform wound care treatments as ordered.  Evaluation of Outcomes: Not Progressing   LCSW Treatment Plan for Primary Diagnosis: <principal problem not specified> Long Term Goal(s): Safe transition to appropriate next level of care at discharge, Engage patient in therapeutic group addressing interpersonal concerns.  Short Term Goals: Engage patient in aftercare planning with referrals and resources, Increase social support, Increase ability to appropriately verbalize feelings, Increase emotional regulation, Facilitate acceptance of mental health diagnosis and concerns, Facilitate patient progression through stages of change regarding substance use diagnoses and concerns, Identify triggers associated with mental health/substance abuse issues and Increase skills for wellness and recovery  Therapeutic Interventions: Assess for all discharge needs, 1 to 1 time with Social worker, Explore available resources and support systems, Assess for adequacy in community support network, Educate family and significant other(s) on suicide prevention, Complete Psychosocial Assessment, Interpersonal group therapy.  Evaluation of Outcomes: Not Progressing   Progress in Treatment: Attending groups: Yes. Participating in groups: Yes. Taking medication as  prescribed: Yes. Toleration medication: Yes. Family/Significant other contact made: No, will contact:  None. Patient declined Patient understands diagnosis: Yes. Discussing patient identified problems/goals with staff: Yes. Medical problems stabilized or resolved: No. Denies suicidal/homicidal ideation: No. Issues/concerns per patient self-inventory: No. Other:   New problem(s) identified: No, Describe:  None  New Short Term/Long Term Goal(s): medication stabilization, elimination of SI thoughts, development of comprehensive mental wellness plan   Patient Goals:  "Work on self" Plans to go to [employer], go to [employer], and take classes to get license back.   Discharge Plan or Barriers: The plan is for patient to discharge and seek outpatient therapy and be referred to [facility]. Patient also plans on getting himself back on his feet.   Reason for Continuation of Hospitalization: Depression Suicidal ideation  Estimated Length of Stay: 1-7 Days   Attendees: Patient: 09/11/2019 1:05 PM  Physician: Rodney Langton 09/11/2019 1:05 PM  Nursing:  09/11/2019 1:05 PM  RN Care Manager: 09/11/2019 1:05 PM  Social Worker: Cyril Loosen and Clayton Lola [place]  09/11/2019 1:05 PM  Recreational Therapist:  09/11/2019 1:05 PM  Other:  09/11/2019 1:05 PM  Other:  09/11/2019 1:05 PM  Other: 09/11/2019 1:05 PM    Scribe for Treatment Team: Susa Simmonds, LCSWA 09/11/2019 1:05 PM

## 2019-09-12 LAB — VALPROIC ACID LEVEL: Valproic Acid Lvl: 84 ug/mL (ref 50.0–100.0)

## 2019-09-12 MED ORDER — MELATONIN 5 MG PO TABS
10.0000 mg | ORAL_TABLET | Freq: Every day | ORAL | Status: DC
  Administered 2019-09-12 – 2019-09-16 (×5): 10 mg via ORAL
  Filled 2019-09-12 (×5): qty 2

## 2019-09-12 NOTE — Plan of Care (Signed)
Patient denies SI/HI/AVH, anxiety and depression with this Clinical research associate  Problem: Education: Goal: Emotional status will improve Outcome: Progressing Goal: Mental status will improve Outcome: Progressing

## 2019-09-12 NOTE — Plan of Care (Signed)
°  Problem: Education: °Goal: Knowledge of [facility] Education information/materials will improve °Outcome: Progressing °Goal: Emotional status will improve °Outcome: Progressing °Goal: Mental status will improve °Outcome: Progressing °Goal: Verbalization of understanding the information provided will improve °Outcome: Progressing °  °Problem: Activity: °Goal: Interest or engagement in activities will improve °Outcome: Progressing °Goal: Sleeping patterns will improve °Outcome: Progressing °  °Problem: Coping: °Goal: Ability to verbalize frustrations and anger appropriately will improve °Outcome: Progressing °Goal: Ability to demonstrate self-control will improve °Outcome: Progressing °  °Problem: Health Behavior/Discharge Planning: °Goal: Identification of resources available to assist in meeting health care needs will improve °Outcome: Progressing °Goal: Compliance with treatment plan for underlying cause of condition will improve °Outcome: Progressing °  °Problem: Physical Regulation: °Goal: Ability to maintain clinical measurements within normal limits will improve °Outcome: Progressing °  °Problem: Safety: °Goal: Periods of time without injury will increase °Outcome: Progressing °  °Problem: Education: °Goal: Knowledge of disease or condition will improve °Outcome: Progressing °Goal: Understanding of discharge needs will improve °Outcome: Progressing °  °Problem: Health Behavior/Discharge Planning: °Goal: Ability to identify changes in lifestyle to reduce recurrence of condition will improve °Outcome: Progressing °Goal: Identification of resources available to assist in meeting health care needs will improve °Outcome: Progressing °  °Problem: Physical Regulation: °Goal: Complications related to the disease process, condition or treatment will be avoided or minimized °Outcome: Progressing °  °Problem: Safety: °Goal: Ability to remain free from injury will improve °Outcome: Progressing °  °Problem:  Education: °Goal: Ability to make informed decisions regarding treatment will improve °Outcome: Progressing °  °Problem: Coping: °Goal: Coping ability will improve °Outcome: Progressing °  °Problem: Health Behavior/Discharge Planning: °Goal: Identification of resources available to assist in meeting health care needs will improve °Outcome: Progressing °  °Problem: Medication: °Goal: Compliance with prescribed medication regimen will improve °Outcome: Progressing °  °Problem: Self-Concept: °Goal: Ability to disclose and discuss suicidal ideas will improve °Outcome: Progressing °Goal: Will verbalize positive feelings about self °Outcome: Progressing °  °

## 2019-09-12 NOTE — Progress Notes (Signed)
Patient pleasant during assessment denying SI/HI/AVH, depression and anxiety with this Clinical research associate. Patient compliant with medication administration per MD orders. Patient endorses pain 7/10 for a headache, see MAR. Patient observed by this Clinical research associate interacting appropriately with staff and peers on the unit. Patient given education, support and encouragement to be active in his treatment plan. Patient being monitored Q 15 minutes for safety per unit protocol. Patient remains safe on the unit.

## 2019-09-12 NOTE — Progress Notes (Signed)
Pt is alert and oriented to person, place, time and situation. Pt is calm, cooperative pleasant, visible in the dayroom, social with staff and peers. Pt reports she slept well last night, appetite is good, pt denies suicidal and homicidal ideation, denies hallucinations, denies feelings of depression and anxiety. Pt is medication compliant. Will continue to monitor pt per Q15 minute face checks and monitor for safety and progress.

## 2019-09-12 NOTE — Progress Notes (Signed)
[facility] MD Progress Note  09/12/2019 4:33 PM John [name]  MRN:  [id number] Subjective:  Feeling better slowly  He says  Principal Problem: <principal problem not specified> Diagnosis: Active Problems:   Bipolar 1 disorder (HCC) ETOH dependence  Total Time spent with patient:--25-30   Past Psychiatric History: ----    Medical History:  Past Medical History:  Diagnosis Date  . ASD (atrial septal defect)   . Bacterial infection due to H. pylori   . Hypertension   . Kidney stones     Past Surgical History:  Procedure Laterality Date  . ASD REPAIR    . FOOT SURGERY Right   . KNEE SURGERY Right    Family History: History reviewed. No pertinent family history. 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 Marital status: Single    Spouse name: Not on file  . Number of children: Not on file  . Years of education: Not on file  . Highest education level: Not on file  Occupational History  . Not on file  Tobacco Use  . Smoking status: Current Every Day Smoker    Types: Cigarettes  . Smokeless tobacco: Never Used  . Tobacco comment: down to 2 cigarettes a day  Vaping Use  . Vaping Use: Never used  Substance and Sexual Activity  . 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 Drug use: Not Currently  . Sexual activity: Yes    Birth control/protection: None  Other Topics Concern  . Not on file  Social History Narrative  . Not on file   Social Determinants of Health   Financial Resource Strain: High Risk  . Difficulty of Paying Living Expenses: Very hard  Food Insecurity: No Food Insecurity   . Worried About Programme researcher, broadcasting/film/video in the Last Year: Never true  . Ran Out of Food in the Last Year: Never true  Transportation Needs: Unmet Transportation Needs  . Lack of Transportation (Medical): Yes  . Lack of Transportation (Non-Medical): Yes  Physical Activity:   . Days of Exercise per Week:   . Minutes of Exercise per Session:   Stress:   . Feeling of Stress :   Social Connections: Moderately Isolated  . Frequency of Communication with Friends and Family: Never  . Frequency of Social Gatherings with Friends and Family: Never  . Attends Religious Services: 1 to 4 times per year  . Active Member of Clubs or Organizations: Yes  . Attends Banker Meetings: 1 to 4 times per year  . Marital Status: Never married   Additional Social History:         He needs a program and work program that will allow him some ---time for recovery   Seeks nicotine patch 24 hrs ---  Also will start naltrexone ---today risks and benefits explained   9 days sober so far    His thinking is somewhat okay mood improving recovery in progress   Has not been sober this long --  Sleep:  Normal   Appetite:  Fair   Current Medications: Current Facility-Administered Medications  Medication Dose Route Frequency Provider Last Rate Last Admin  . acetaminophen (TYLENOL) tablet 650 mg  650 mg Oral Q6H PRN Gillermo Murdoch, NP   650 mg at 09/11/19 2206  . alum & mag hydroxide-simeth (MAALOX/MYLANTA) 200-200-20 MG/5ML suspension 30 mL  30 mL Oral Q4H PRN Gillermo Murdoch, NP      . divalproex (DEPAKOTE) DR tablet 1,000 mg  1,000 mg Oral BID Gillermo Murdoch, NP   1,000 mg at 09/12/19 0755  . DULoxetine (CYMBALTA) DR capsule 40 mg  40 mg Oral Daily Roselind Messier, MD   40 mg at 09/12/19 0755  . gabapentin (NEURONTIN) capsule 300 mg  300 mg Oral TID Gillermo Murdoch, NP   300 mg at 09/12/19 1231  . magnesium hydroxide (MILK OF MAGNESIA) suspension 30 mL   30 mL Oral Daily PRN Gillermo Murdoch, NP      . melatonin tablet 10 mg  10 mg Oral QHS Roselind Messier, MD      . nicotine (NICODERM CQ - dosed in mg/24 hours) patch 21 mg  21 mg Transdermal Daily Gillermo Murdoch, NP   21 mg at 09/12/19 0756  . OLANZapine zydis (ZYPREXA) disintegrating tablet 7.5 mg  7.5 mg Oral QHS Roselind Messier, MD   7.5 mg at 09/11/19 2207  . thiamine tablet 100 mg  100 mg Oral Daily Gillermo Murdoch, NP   100 mg at 09/12/19 2831   Or  . thiamine (B-1) injection 100 mg  100 mg Intravenous Daily Gillermo Murdoch, NP        Lab Results:  Results for orders placed or performed during the hospital encounter of 09/09/19 (from the past 48 hour(s))  Valproic acid level     Status: None   Collection Time: 09/12/19  7:23 AM  Result Value Ref Range   Valproic Acid Lvl 84 50.0 - 100.0 ug/mL    Comment: Performed at [facility], [address] [place], [place] [address]    Blood Alcohol level:  Lab Results  Component Value Date   ETH 212 (H) 09/05/2019   ETH 167 (H) 09/01/2019    Metabolic Disorder Labs: No results found for: HGBA1C, MPG No results found for: PROLACTIN No results found for: CHOL, TRIG, HDL, CHOLHDL, VLDL, LDLCALC  Physical Findings: AIMS:  , ,  ,  ,    CIWA:  CIWA-Ar Total: 0 COWS:     Musculoskeletal: Strength & Muscle Tone: normal  Gait & Station: limited from left hip pain  Patient leans:  N/a   Psychiatric Specialty Exam: Physical Exam  Review of Systems  Blood pressure 115/74, pulse 76, temperature 97.8 F (36.6 C), temperature source Oral, resp. rate 17, height 5\' 9"  (1.753 m), weight 73.9 kg, SpO2 97 %.Body mass index is 24.06 kg/m.  Mental Status  Improving with mood  Affect better more animated Less strange less anxious Feels less under influence of post ETOH chronic No shakes and tremors Coming out of general slowness Sleep better  adl's improving, cognition returning meds helping with  mood Thought process and content ---more logical less vague less paucity of content no frank psychosis or mania Memory --improving --remote recent and of knowledge and intelligence generally normal but can improve Judgement insight reliability --poor but slightly better Appetite okay Movements none  Oriented to person place and time Somewhat less victim role and themes More cooperative  SI and HI contracts for  safety                                                     Sleep:  Number of Hours: 7.25     Treatment Plan Summary:  Continues on unit pending recovery house match and med mgt stablity   Roselind Messier, MD 09/12/2019, 4:33 PM

## 2019-09-12 NOTE — Progress Notes (Signed)
Recreation Therapy Notes  INPATIENT RECREATION THERAPY ASSESSMENT  Patient Details Name: John [name] MRN: [id number] DOB: 1980-07-07 Today's Date: 09/12/2019       Information Obtained From: Patient  Able to Participate in Assessment/Interview: Yes  Patient Presentation: Responsive  Reason for Admission (Per Patient): Active Symptoms, Suicidal Ideation  Patient Stressors:    Coping Skills:   Substance Abuse  Leisure Interests (2+):  Ashby Dawes - Lawn care, Associate Professor)  Frequency of Recreation/Participation:    Awareness of Community Resources:     [employer]:     Current Use:    If no, Barriers?:    Expressed Interest in [employer] Information:    [employer] of Residence:  Film/video editor  Patient Main Form of Transportation:    Patient Strengths:  N/A  Patient Identified Areas of Improvement:  N/A  Patient Goal for Hospitalization:  N/A  Current SI (including self-harm):  No  Current HI:  No  Current AVH: No  Staff Intervention Plan: Collaborate with Interdisciplinary Treatment Team, Group Attendance  Consent to Intern Participation: N/A  Shane Badeaux 09/12/2019, 4:12 PM

## 2019-09-12 NOTE — Progress Notes (Signed)
Patient is pleasant and cooperative. Patient denies SI/HI/AH/VH but endorses anxiety and depression.  He received his prescribed meds and tolerated without incident. He is active on the unit and engages well with peers.  He remains safe on the unit with 15 minute safety checks and informed to contact staff with any concerns.  Cleo Butler-Nicholson, LPN

## 2019-09-12 NOTE — Progress Notes (Signed)
Recreation Therapy Notes  Date: 09/12/2019  Time: 9:30 am  Location: Craft-room    Behavioral response: Appropriate  Intervention Topic: Stress Management   Discussion/Intervention:  Group content on today was focused on stress. The group defined stress and way to cope with stress. Participants expressed how they know when they are stresses out. Individuals described the different ways they have to cope with stress. The group stated reasons why it is important to cope with stress. Patient explained what good stress is and some examples. The group participated in the intervention "Stress Management". Individuals were separated into two group and answered questions related to stress.  Clinical Observations/Feedback:  Patient came to group and expressed that he normally deals with his stress by drinking and getting high. Individual was social with peers and staff while participating in the intervention. Participant was pulled from group by registration and never returned.   John [name] LRT/CTRS          John [name] 09/12/2019 1:49 PM

## 2019-09-13 NOTE — Progress Notes (Signed)
Patient pleasant during assessment denying SI/HI/AVH, depression and anxiety with this Clinical research associate. Patient compliant with medication administration per MD orders. Patient observed by this Clinical research associate interacting appropriately with staff and peers on the unit. Patient given education, support and encouragement to be active in his treatment plan. Patient being monitored Q 15 minutes for safety per unit protocol. Patient remains safe on the unit.

## 2019-09-13 NOTE — Progress Notes (Signed)
Recreation Therapy Notes   Date: 09/13/2019  Time: 9:30 am  Location: Craft-room    Behavioral response: Appropriate  Intervention Topic: Values  Discussion/Intervention:  Group content today was focused on values. The group identified what values are and where they come from. Individuals expressed some values and how many they have. Patients described how they go about add or removing values. The group described the importance of having values and how they go about using them in daily life. Patient participated in the intervention "My Values" where they were able to pick out values that were important to them and made a visual aide.  Clinical Observations/Feedback:  Patient came to group late due to unknown reasons. He identified his values as trust, self-control, respect and honesty. Individual was social with peers and staff while participating in the intervention.  Sharmel Ballantine LRT/CTRS         Krystn Dermody 09/13/2019 1:49 PM

## 2019-09-13 NOTE — Plan of Care (Signed)
Patient denies SI/HI/AVH and is calm and appropriate on the unit   Problem: Education: Goal: Emotional status will improve Outcome: Progressing Goal: Mental status will improve Outcome: Progressing

## 2019-09-13 NOTE — Plan of Care (Signed)
Patient is appropriate with staff & peers.Patient stated that he is anxious about where to go from here but she is getting better with his depression.Denies SI,HI and AVH.Compliant with medications.Attended groups.Appetite and energy level good.Support and encouragement given.

## 2019-09-13 NOTE — Progress Notes (Signed)
Patient ID: John [name], male   DOB: 10/31/1980, 39 y.o.   MRN: [id number]   [facility] MD Progress Note  09/13/2019 3:59 PM John [name]  MRN:  [id number] Principal Psychiatric Diagnosis: <principal problem not specified> Diagnosis: Active Problems:   Bipolar 1 disorder (HCC)   Total Time spent with patient:  25   ETOH dependence  Personality disorder       DAILY NARRATIVE  Subjective:  I am feeling better and clearer   Objective:  He actually looks better, brigher more animated, cleaner less in a fog   Says he is calling places for living  [facility] he follows through   Physical Findings: AIMS:  , ,  ,  ,    CIWA:  CIWA-Ar Total: 0 COWS:     PHYSICAL EXAM  Physical Exam  SYSTEMS REVIEW  Review of Systems  MENTAL STATUS  Pertinents only   Alert as above Mood improving Affect better Less in a daze and fog No active SI HI or plans hygiene grooming better -- No psychosis or mania Contracts for safety  NO side effects or medical   Concentration and attention okay Better Consciousness not as clouded          Current Medications: Current Facility-Administered Medications  Medication Dose Route Frequency Provider Last Rate Last Admin  . acetaminophen (TYLENOL) tablet 650 mg  650 mg Oral Q6H PRN Gillermo Murdoch, NP   650 mg at 09/12/19 2118  . alum & mag hydroxide-simeth (MAALOX/MYLANTA) 200-200-20 MG/5ML suspension 30 mL  30 mL Oral Q4H PRN Gillermo Murdoch, NP      . divalproex (DEPAKOTE) DR tablet 1,000 mg  1,000 mg Oral BID Gillermo Murdoch, NP   1,000 mg at 09/13/19 0818  . DULoxetine (CYMBALTA) DR capsule 40 mg  40 mg Oral Daily Roselind Messier, MD   40 mg at 09/13/19 0819  . gabapentin (NEURONTIN) capsule 300 mg  300 mg Oral TID Gillermo Murdoch, NP   300 mg at 09/13/19 1150  . magnesium hydroxide (MILK OF MAGNESIA) suspension 30 mL  30 mL Oral Daily PRN Gillermo Murdoch, NP      . melatonin tablet 10 mg  10 mg Oral QHS Roselind Messier, MD   10 mg at 09/12/19 2115  . nicotine (NICODERM CQ - dosed in mg/24 hours) patch 21 mg  21 mg Transdermal Daily Gillermo Murdoch, NP   21 mg at 09/13/19 0818  . OLANZapine zydis (ZYPREXA) disintegrating tablet 7.5 mg  7.5 mg Oral QHS Roselind Messier, MD   7.5 mg at 09/12/19 2115  . thiamine tablet 100 mg  100 mg Oral Daily Gillermo Murdoch, NP   100 mg at 09/13/19 0818   Or  . thiamine (B-1) injection 100 mg  100 mg Intravenous Daily Gillermo Murdoch, NP        Lab Results:  Results for orders placed or performed during the hospital encounter of 09/09/19 (from the past 48 hour(s))  Valproic acid level     Status: None   Collection Time: 09/12/19  7:23 AM  Result Value Ref Range   Valproic Acid Lvl 84 50.0 - 100.0 ug/mL    Comment: Performed at [facility], [address] [place], [place] [address]    Blood Alcohol level:  Lab Results  Component Value Date   ETH 212 (H) 09/05/2019   ETH 167 (H) 09/01/2019    Metabolic Disorder Labs: No results found for: HGBA1C, MPG No results found for: PROLACTIN No results  found for: CHOL, TRIG, HDL, CHOLHDL, VLDL, LDLCALC   Past Psychiatric History:   Past Medical/Surgical History  Past Medical History:  Diagnosis Date  . ASD (atrial septal defect)   . Bacterial infection due to H. pylori   . Hypertension   . Kidney stones     Past Surgical History:  Procedure Laterality Date  . ASD REPAIR    . FOOT SURGERY Right   . KNEE SURGERY Right     Family Medical and Psychiatric History:  History reviewed. No pertinent family history.  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 Marital status: Single    Spouse name: Not on file   . Number of children: Not on file  . Years of education: Not on file  . Highest education level: Not on file  Occupational History  . Not on file  Tobacco Use  . Smoking status: Current Every Day Smoker    Types: Cigarettes  . Smokeless tobacco: Never Used  . Tobacco comment: down to 2 cigarettes a day  Vaping Use  . Vaping Use: Never used  Substance and Sexual Activity  . 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 Drug use: Not Currently  . Sexual activity: Yes    Birth control/protection: None  Other Topics Concern  . Not on file  Social History Narrative  . Not on file   Social Determinants of Health   Financial Resource Strain: High Risk  . Difficulty of Paying Living Expenses: Very hard  Food Insecurity: No Food Insecurity  . Worried About Programme researcher, broadcasting/film/video in the Last Year: Never true  . Ran Out of Food in the Last Year: Never true  Transportation Needs: Unmet Transportation Needs  . Lack of Transportation (Medical): Yes  . Lack of Transportation (Non-Medical): Yes  Physical Activity:   . Days of Exercise per Week:   . Minutes of Exercise per Session:   Stress:   . Feeling of Stress :   Social Connections: Moderately Isolated  . Frequency of Communication with Friends and Family: Never  . Frequency of Social Gatherings with Friends and Family: Never  . Attends Religious Services: 1 to 4 times per year  . Active Member of Clubs or Organizations: Yes  . Attends Banker Meetings: 1 to 4 times per year  . Marital Status: Never married                            Summary:  Gradual recovery dual diagnosis hopefull he wil lget to right program soon this time and not be revolving door of admits   Prescription Plan:  Same meds   Estimated Length of Stay:  5-7   Roselind Messier, MD 09/13/2019, 3:59 PM

## 2019-09-13 NOTE — BHH Counselor (Signed)
CSW met with patient to discuss aftercare plans.  CSW explained that role of CSW includes referrals for SA treatment.  Pt declined referrals at this time, stating that he wanted to call the places on his own and let CSW know where he would like to be referred.  Assunta Curtis, MSW, LCSW 09/13/2019 3:14 PM

## 2019-09-14 MED ORDER — NORTRIPTYLINE HCL 10 MG PO CAPS
10.0000 mg | ORAL_CAPSULE | Freq: Every day | ORAL | Status: DC
  Administered 2019-09-14 – 2019-09-15 (×2): 10 mg via ORAL
  Filled 2019-09-14 (×3): qty 1

## 2019-09-14 MED ORDER — OLANZAPINE 5 MG PO TBDP
15.0000 mg | ORAL_TABLET | Freq: Every day | ORAL | Status: DC
  Administered 2019-09-14 – 2019-09-16 (×3): 15 mg via ORAL
  Filled 2019-09-14 (×3): qty 3

## 2019-09-14 MED ORDER — IBUPROFEN 600 MG PO TABS
600.0000 mg | ORAL_TABLET | Freq: Four times a day (QID) | ORAL | Status: DC | PRN
  Administered 2019-09-14 – 2019-09-15 (×2): 600 mg via ORAL
  Filled 2019-09-14 (×2): qty 1

## 2019-09-14 NOTE — Progress Notes (Signed)
D- Patient alert and oriented. Affect/mood is worried and cooperative. Pt denies SI, HI, AVH, and pain. Pt went outside for groups.   A- Scheduled medications administered to patient, per MD orders. Support and encouragement provided.  Routine safety checks conducted every 15 minutes.  Patient informed to notify staff with problems or concerns.  R- No adverse drug reactions noted. Patient contracts for safety at this time. Patient compliant with medications and treatment plan. Patient receptive, calm, and cooperative. Patient interacts well with others on the unit.  Patient remains safe at this time.   John Mayers RN

## 2019-09-14 NOTE — Progress Notes (Signed)
PT Cancellation Note  Patient Details Name: John [name] MRN: [id number] DOB: 1980/03/12   Cancelled Treatment:    Reason Eval/Treat Not Completed: Other (comment). Consult received. Observed this patient ambulating independently on unit while PT was working with another patient. Pt able to turn head and demonstrate safe gait without LOB noted. Pt ambulated multiple laps on unit without AD. Discussed with RN. Not appropriate for acute rehab needs at this time. Will dc current order at this time. Please re-order if needs arise.    Pietra Zuluaga 09/14/2019, 4:21 PM  Elizabeth Palau, PT, DPT [phone]

## 2019-09-14 NOTE — Progress Notes (Signed)
Patient ID: John [name], male   DOB: 12-24-1980, 39 y.o.   MRN: [id number]   Brief Progress note  Patient is gradually better We hope he follows through with half way houses His mood, affect are improved He is oriented times four  He is not psychotic or manic He contracts for safety  He is less anxious No movements His left leg he is requesting PT  He is asking for phone time to make his calls nursing notified to allow him   He has no other new medical  Meds modifed --where he asks for ibuprofen and not other pain meds  He has sex side effects with cymbalta so I changed to HS nortryptiline to see  Depakote level okay  Possible Discharge as soon as he gets placement

## 2019-09-14 NOTE — Progress Notes (Addendum)
Recreation Therapy Notes  Date: 09/14/2019  Time: 9:30 am  Location: Court yard   Behavioral response: Appropriate  Intervention Topic: Leisure  Discussion/Intervention:  Group content today was focused on leisure. The group defined what leisure is and some positive leisure activities they participate in. Individuals identified the difference between good and bad leisure. Participants expressed how they feel after participating in the leisure of their choice. The group discussed how they go about picking a leisure activity and if others are involved in their leisure activities. The patient stated how many leisure activities they have to choose from and reasons why it is important to have leisure time. Individuals participated in the intervention Exploration of Leisure where they had a chance to identify new leisure activities as well as benefits of leisure. Clinical Observations/Feedback:  Patient came to group and explained that his leisure involves spending time outside and listening to music. Individual was social with peers and staff while participating in the intervention.  Stephon Weathers LRT/CTRS           Tamu Golz 09/14/2019 12:36 PM

## 2019-09-14 NOTE — Progress Notes (Signed)
Pt was spoken to about making sexual comments to staff. I told the patient that it was disrespectful. He verbally contracted not to anymore. Staff has been notified. Torrie Mayers RN

## 2019-09-14 NOTE — Plan of Care (Signed)
Pt rates depression and anxiety both at 5/10. Pt denies SI, HI and AVH. Pt was educated on care plan and verbalizes understandng. Pt was encouraged to attend groups. Torrie Mayers.  Problem: Education: Goal: Knowledge of [facility] Education information/materials will improve Outcome: Progressing Goal: Emotional status will improve Outcome: Progressing Goal: Mental status will improve Outcome: Progressing Goal: Verbalization of understanding the information provided will improve Outcome: Progressing   Problem: Activity: Goal: Interest or engagement in activities will improve Outcome: Progressing Goal: Sleeping patterns will improve Outcome: Progressing   Problem: Coping: Goal: Ability to verbalize frustrations and anger appropriately will improve Outcome: Progressing Goal: Ability to demonstrate self-control will improve Outcome: Progressing   Problem: Health Behavior/Discharge Planning: Goal: Identification of resources available to assist in meeting health care needs will improve Outcome: Progressing Goal: Compliance with treatment plan for underlying cause of condition will improve Outcome: Progressing   Problem: Physical Regulation: Goal: Ability to maintain clinical measurements within normal limits will improve Outcome: Progressing   Problem: Safety: Goal: Periods of time without injury will increase Outcome: Progressing   Problem: Education: Goal: Knowledge of disease or condition will improve Outcome: Progressing Goal: Understanding of discharge needs will improve Outcome: Progressing   Problem: Health Behavior/Discharge Planning: Goal: Ability to identify changes in lifestyle to reduce recurrence of condition will improve Outcome: Progressing Goal: Identification of resources available to assist in meeting health care needs will improve Outcome: Progressing   Problem: Physical Regulation: Goal: Complications related to the disease process, condition  or treatment will be avoided or minimized Outcome: Progressing   Problem: Safety: Goal: Ability to remain free from injury will improve Outcome: Progressing   Problem: Education: Goal: Ability to make informed decisions regarding treatment will improve Outcome: Progressing   Problem: Coping: Goal: Coping ability will improve Outcome: Progressing   Problem: Health Behavior/Discharge Planning: Goal: Identification of resources available to assist in meeting health care needs will improve Outcome: Progressing   Problem: Medication: Goal: Compliance with prescribed medication regimen will improve Outcome: Progressing   Problem: Self-Concept: Goal: Ability to disclose and discuss suicidal ideas will improve Outcome: Progressing Goal: Will verbalize positive feelings about self Outcome: Progressing

## 2019-09-14 NOTE — BHH Counselor (Signed)
CSW provided the patient with a list of [address] in [place] and [place].  Penni Homans, MSW, LCSW 09/14/2019 2:02 PM

## 2019-09-15 NOTE — Progress Notes (Signed)
Patient ID: John [name], male   DOB: 10-19-1980, 39 y.o.   MRN: [id number]   [facility] MD Progress Note  09/15/2019 1:18 PM John [name]  MRN:  [id number] Principal Psychiatric Diagnosis: <principal problem not specified> Diagnosis: Active Problems:   Bipolar 1 disorder (HCC)   Total Time spent with patient: 25-30  DAILY NARRATIVE  We discussed his life history, issues in jail sobriety growing up ---issues with homelessness --overall work history and all  Hopefully he will go to [address] house soon and stay stable   meds seem to help him in general for now     Subjective:  He is noticing much better cognition and mood   Objective:  Looks cleaner brighter more clarity   Physical Findings: AIMS:  , ,  ,  ,    CIWA:  CIWA-Ar Total: 0 COWS:     PHYSICAL EXAM  Physical Exam  SYSTEMS REVIEW  Review of Systems  MENTAL STATUS  Pertinents  ADL's  Okay  Cognition improving Sleep okay Movements none Rapport and eye contact better Mood and affect improving Less anxious  Less vague and general More animated  No frank psychosis or mania No active SI HI or plans  Attention and concentration better Sensorium not clouded or fluctuant    Risk to Self: What has been your use of drugs/alcohol within the last 12 months?: Alcohol./. Drinks anything he can get his hands on and afford Risk to Others:  none     Current Medications: Current Facility-Administered Medications  Medication Dose Route Frequency Provider Last Rate Last Admin  . acetaminophen (TYLENOL) tablet 650 mg  650 mg Oral Q6H PRN John Murdoch, NP   650 mg at 09/14/19 1645  . alum & mag hydroxide-simeth (MAALOX/MYLANTA) 200-200-20 MG/5ML suspension 30 mL  30 mL Oral Q4H PRN John Murdoch, NP      . divalproex (DEPAKOTE) DR tablet 1,000 mg  1,000 mg Oral BID John Murdoch, NP   1,000 mg at 09/15/19 0742  . gabapentin (NEURONTIN) capsule 300 mg  300 mg Oral TID John Murdoch, NP    300 mg at 09/15/19 1309  . ibuprofen (ADVIL) tablet 600 mg  600 mg Oral Q6H PRN John Messier, MD   600 mg at 09/14/19 2128  . magnesium hydroxide (MILK OF MAGNESIA) suspension 30 mL  30 mL Oral Daily PRN John Murdoch, NP      . melatonin tablet 10 mg  10 mg Oral QHS John Messier, MD   10 mg at 09/14/19 2129  . nicotine (NICODERM CQ - dosed in mg/24 hours) patch 21 mg  21 mg Transdermal Daily John Murdoch, NP   21 mg at 09/15/19 0743  . nortriptyline (PAMELOR) capsule 10 mg  10 mg Oral QHS John Messier, MD   10 mg at 09/14/19 2129  . OLANZapine zydis (ZYPREXA) disintegrating tablet 15 mg  15 mg Oral QHS John Messier, MD   15 mg at 09/14/19 2129  . thiamine tablet 100 mg  100 mg Oral Daily John Murdoch, NP   100 mg at 09/15/19 9628   Or  . thiamine (B-1) injection 100 mg  100 mg Intravenous Daily John Murdoch, NP        Lab Results: No results found for this or any previous visit (from the past 48 hour(s)).  Blood Alcohol level:  Lab Results  Component Value Date   ETH 212 (H) 09/05/2019   ETH 167 (H) 09/01/2019    Metabolic Disorder Labs: No  results found for: HGBA1C, MPG No results found for: PROLACTIN No results found for: CHOL, TRIG, HDL, CHOLHDL, VLDL, LDLCALC   Past Psychiatric History:   Past Medical/Surgical History  Past Medical History:  Diagnosis Date  . ASD (atrial septal defect)   . Bacterial infection due to H. pylori   . Hypertension   . Kidney stones     Past Surgical History:  Procedure Laterality Date  . ASD REPAIR    . FOOT SURGERY Right   . KNEE SURGERY Right     Family Medical and Psychiatric History:  History reviewed. No pertinent family history.  Social History:   Social History   Substance and Sexual Activity  Alcohol Use Not Currently   Comment: Last consumed Friday 06/10/19, pt states he is "an alcoholic" and "wants to drink"     Social History   Substance and Sexual Activity  Drug Use  Not Currently    Social History   Socioeconomic History  . Marital status: Single    Spouse name: Not on file  . Number of children: Not on file  . Years of education: Not on file  . Highest education level: Not on file  Occupational History  . Not on file  Tobacco Use  . Smoking status: Current Every Day Smoker    Types: Cigarettes  . Smokeless tobacco: Never Used  . Tobacco comment: down to 2 cigarettes a day  Vaping Use  . Vaping Use: Never used  Substance and Sexual Activity  . 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 Drug use: Not Currently  . Sexual activity: Yes    Birth control/protection: None  Other Topics Concern  . Not on file  Social History Narrative  . Not on file   Social Determinants of Health   Financial Resource Strain: High Risk  . Difficulty of Paying Living Expenses: Very hard  Food Insecurity: No Food Insecurity  . Worried About Programme researcher, broadcasting/film/video in the Last Year: Never true  . Ran Out of Food in the Last Year: Never true  Transportation Needs: Unmet Transportation Needs  . Lack of Transportation (Medical): Yes  . Lack of Transportation (Non-Medical): Yes  Physical Activity:   . Days of Exercise per Week:   . Minutes of Exercise per Session:   Stress:   . Feeling of Stress :   Social Connections: Moderately Isolated  . Frequency of Communication with Friends and Family: Never  . Frequency of Social Gatherings with Friends and Family: Never  . Attends Religious Services: 1 to 4 times per year  . Active Member of Clubs or Organizations: Yes  . Attends Banker Meetings: 1 to 4 times per year  . Marital Status: Never married                            Summary:  Caucasian male relatively stable now awaiting [address] house confirmation ---homeless--would not be productive discharging as he would end up intoxicated and back in frequent ER  cycle visits   Hopefully he will do well in half way house and not sabotage   Prescription Plan:  Same meds for now   Estimated Length of Stay:  5-7 days    John Messier, MD 09/15/2019, 1:18 PM

## 2019-09-15 NOTE — Progress Notes (Signed)
Patient pleasant and cooperative in no distress. Denies SI, HI, AVH. Complains of Headache. Prn given with good relief. Patient slept well this shift in no distress. Encouragement and support provided. Safety checks maintained. Medications given as prescribed. Pt receptive and remains safe on unit with q 15 min checks.

## 2019-09-15 NOTE — Plan of Care (Signed)
  Problem: Education: Goal: Verbalization of understanding the information provided will improve Outcome: Progressing   Problem: Activity: Goal: Interest or engagement in activities will improve Outcome: Progressing Goal: Sleeping patterns will improve Outcome: Progressing   Problem: Safety: Goal: Periods of time without injury will increase Outcome: Progressing

## 2019-09-15 NOTE — Plan of Care (Signed)
Pt denies depression, anxiety, SI, HI and AVS. Pt was educated on care plan and verbalizes understanding. Pt was encouraged to attend groups. Pt has a goal to wash clothes. Torrie Mayers RN Problem: Education: Goal: Knowledge of  General Education information/materials will improve Outcome: Progressing Goal: Emotional status will improve Outcome: Progressing Goal: Mental status will improve Outcome: Progressing Goal: Verbalization of understanding the information provided will improve Outcome: Progressing   Problem: Activity: Goal: Interest or engagement in activities will improve Outcome: Progressing Goal: Sleeping patterns will improve Outcome: Progressing   Problem: Coping: Goal: Ability to verbalize frustrations and anger appropriately will improve Outcome: Progressing Goal: Ability to demonstrate self-control will improve Outcome: Progressing   Problem: Health Behavior/Discharge Planning: Goal: Identification of resources available to assist in meeting health care needs will improve Outcome: Progressing Goal: Compliance with treatment plan for underlying cause of condition will improve Outcome: Progressing   Problem: Physical Regulation: Goal: Ability to maintain clinical measurements within normal limits will improve Outcome: Progressing   Problem: Safety: Goal: Periods of time without injury will increase Outcome: Progressing   Problem: Education: Goal: Knowledge of disease or condition will improve Outcome: Progressing Goal: Understanding of discharge needs will improve Outcome: Progressing   Problem: Health Behavior/Discharge Planning: Goal: Ability to identify changes in lifestyle to reduce recurrence of condition will improve Outcome: Progressing Goal: Identification of resources available to assist in meeting health care needs will improve Outcome: Progressing   Problem: Physical Regulation: Goal: Complications related to the disease process,  condition or treatment will be avoided or minimized Outcome: Progressing   Problem: Safety: Goal: Ability to remain free from injury will improve Outcome: Progressing   Problem: Education: Goal: Ability to make informed decisions regarding treatment will improve Outcome: Progressing   Problem: Coping: Goal: Coping ability will improve Outcome: Progressing   Problem: Health Behavior/Discharge Planning: Goal: Identification of resources available to assist in meeting health care needs will improve Outcome: Progressing   Problem: Medication: Goal: Compliance with prescribed medication regimen will improve Outcome: Progressing   Problem: Self-Concept: Goal: Ability to disclose and discuss suicidal ideas will improve Outcome: Progressing Goal: Will verbalize positive feelings about self Outcome: Progressing

## 2019-09-15 NOTE — Progress Notes (Signed)
Recreation Therapy Notes  Date: 09/15/2019  Time: 9:30 am  Location: Court yard   Behavioral response: Appropriate   Intervention Topic: Animal Assisted Therapy   Discussion/Intervention:  Animal Assisted Therapy took place today during group.  Animal Assisted Therapy is the planned inclusion of an animal in a patient's treatment plan. The patients were able to engage in therapy with an animal during group. Participants were educated on what a service dog is and the different between a support dog and a service dog. Patient were informed on the many animal needs there are and how their needs are similar. Individuals were enlightened on the process to get a service animal or support animal. Patients got the opportunity to pet the animal and were offered emotional support from the animal and staff.  Clinical Observations/Feedback:  Patient came to group and was on topic and was focused on what peers and staff had to say. Participant shared their experiences and history with animals. Individual was social with peers, staff and animal while participating in group.  Tarrell Debes LRT/CTRS          Jafeth Mustin 09/15/2019 11:55 AM

## 2019-09-15 NOTE — Progress Notes (Signed)
BRIEF PHARMACY NOTE   This patient attended and participated in Medication Management Group counseling led by [facility] staff pharmacist.  This interactive class reviews basic information about prescription medications and education on personal responsibility in medication management.  The class also includes general knowledge of 3 main classes of behavioral medications, including antipsychotics, antidepressants, and mood stabilizers.     Patient behavior was appropriate for group setting.   Educational materials sourced from:  "Medication Do's and Don'ts" from WWW.MED-PASS.COM   "Mental Health Medications" from National Institute of Mental Health Https://www.nimh.nih.gov/health/topics/mental-health-medications/index.shtml#part 149856    Hitesh Fouche M Stiles Maxcy, PharmD, BCPS Clinical Pharmacist 09/15/2019 3:16 PM  

## 2019-09-15 NOTE — Progress Notes (Signed)
D- Patient alert and oriented. Affect/mood is apprehensive, calm and cooperative. Pt denies SI, HI, AVH, and pain. Pt says that he is feeling better. Patient went to all of the groups. Pt says that he is waiting back to hear from two places to dc to.   A- Scheduled medications administered to patient, per MD orders. Support and encouragement provided.  Routine safety checks conducted every 15 minutes.  Patient informed to notify staff with problems or concerns.  R- No adverse drug reactions noted. Patient contracts for safety at this time. Patient compliant with medications and treatment plan. Patient receptive, calm, and cooperative. Patient interacts well with others on the unit.  Patient remains safe at this time.  Torrie Mayers RN

## 2019-09-15 NOTE — BHH Counselor (Addendum)
CSW met with the patient again to go over aftercare.  Patient remains hesitant to identify possible aftercare referrals.   CSW obtained the information to send [facility] referral.  CSW reviewdd [facility], [facility] and [facility] program with the patient.  Patient declined [facility] and [facility] at this time, however, did agree to a [facility] referral.    [place] faxed the referrals for [facility] and [facility] and received confirmation the fax was successful.  Assunta Curtis, MSW, LCSW 09/15/2019 10:56 AM

## 2019-09-16 MED ORDER — NORTRIPTYLINE HCL 25 MG PO CAPS
25.0000 mg | ORAL_CAPSULE | Freq: Every day | ORAL | Status: DC
  Administered 2019-09-16: 25 mg via ORAL
  Filled 2019-09-16 (×2): qty 1

## 2019-09-16 MED ORDER — GABAPENTIN 300 MG PO CAPS
600.0000 mg | ORAL_CAPSULE | Freq: Three times a day (TID) | ORAL | Status: DC
  Administered 2019-09-16 – 2019-09-17 (×4): 600 mg via ORAL
  Filled 2019-09-16 (×4): qty 2

## 2019-09-16 NOTE — BHH Group Notes (Signed)
[facility] Group Notes:  (Nursing/MHT/Case Management/Adjunct)  Date:  09/16/2019  Time:  10:19 PM  Type of Therapy:  Group Therapy  Participation Level:  Active  Participation Quality:  Appropriate and Attentive  Affect:  Appropriate  Cognitive:  Alert and Appropriate  Insight:  Appropriate  Engagement in Group:  Engaged  Modes of Intervention:  Goals/Wrap up group  Summary of Progress/Problems: MHT conducted goals and wrap up group encouraging patient to participate in setting goals daily in order increase self-fulfillment. Patient first stated that he did not have a goal for the day but then revised his statement by stating that his goal for the day is to relax and take it easy focusing on being more positive. Patient agrees that he met this goal for the day and is going to continue working towards this goal even outside of the facility, also patient states that he will try and develop a new goal to work on for tomorrow. Patient also expresses at this point and time he is doing okay mentally and physically  John [name] 09/16/2019, 10:19 PM

## 2019-09-16 NOTE — BHH Counselor (Signed)
CSW followed up with patient who reports that the [facility] will take a vote on Sunday.  He reports apprehension on going to the home due to financial constraints.   CSW called [facility] to check on status of referral.  CSW was informed that patient needs to call between 8 and 8:30AM daily, including weekends, to check on bed availability.  Penni Homans, MSW, LCSW 09/16/2019 12:31 PM

## 2019-09-16 NOTE — BHH Counselor (Signed)
CSW provided pt with information on [facility].  Pt agreed to call.  Pt requested information on [facility] and [facility].  CSW provided patient with the requested information.  Pt reports he will follow up with [facility] this evening.  Penni Homans, MSW, LCSW 09/16/2019 2:32 PM

## 2019-09-16 NOTE — Progress Notes (Signed)
[facility] MD Progress Note  09/16/2019 2:57 PM DAIMIEN PATMON  MRN:  [id number] Subjective:   I may go soon to [facility]   Principal Problem: <principal problem not specified> Diagnosis: Active Problems:   Bipolar 1 disorder (HCC) ETOH -- Dependence  Chronic Pain Personality Disorder NOS   Total Time spent with patient:  15 -20   Past Psychiatric History: already written Past Medical History:  Past Medical History:  Diagnosis Date  . ASD (atrial septal defect)   . Bacterial infection due to H. pylori   . Hypertension   . Kidney stones     Past Surgical History:  Procedure Laterality Date  . ASD REPAIR    . FOOT SURGERY Right   . KNEE SURGERY Right    Family History: History reviewed. No pertinent family history. 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 Marital status: Single    Spouse name: Not on file  . Number of children: Not on file  . Years of education: Not on file  . Highest education level: Not on file  Occupational History  . Not on file  Tobacco Use  . Smoking status: Current Every Day Smoker    Types: Cigarettes  . Smokeless tobacco: Never Used  . Tobacco comment: down to 2 cigarettes a day  Vaping Use  . Vaping Use: Never used  Substance and Sexual Activity  . 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 Drug use: Not Currently  . Sexual activity: Yes    Birth control/protection: None  Other Topics Concern  . Not on file  Social History Narrative  . Not on file   Social Determinants of Health   Financial Resource Strain: High Risk  . Difficulty of  Paying Living Expenses: Very hard  Food Insecurity: No Food Insecurity  . Worried About Programme researcher, broadcasting/film/video in the Last Year: Never true  . Ran Out of Food in the Last Year: Never true  Transportation Needs: Unmet Transportation Needs  . Lack of Transportation (Medical): Yes  . Lack of Transportation (Non-Medical): Yes  Physical Activity:   . Days of Exercise per Week:   . Minutes of Exercise per Session:   Stress:   . Feeling of Stress :   Social Connections: Moderately Isolated  . Frequency of Communication with Friends and Family: Never  . Frequency of Social Gatherings with Friends and Family: Never  . Attends Religious Services: 1 to 4 times per year  . Active Member of Clubs or Organizations: Yes  . Attends Banker Meetings: 1 to 4 times per year  . Marital Status: Never married   Additional Social History:   His [place] Dad said he will pay for [facility] pick him and take him to pharmacy either tomorrow night or Sunday.   Patient has done his best although is repetitive and needs at times  Mood anxiety and sleep better overall No other new mood swings Less focus on ETOH and all so far Feels this is a good med combination  No other new medical problems or side effects                         Sleep: better overall now with meds and balance and structure and no ETOH  Appetite:  Improved  Current Medications: Current Facility-Administered Medications  Medication Dose Route Frequency Provider Last Rate Last Admin  . acetaminophen (TYLENOL) tablet 650 mg  650 mg Oral Q6H PRN Gillermo Murdoch, NP   650 mg at 09/14/19 1645  . alum & mag hydroxide-simeth (MAALOX/MYLANTA) 200-200-20 MG/5ML suspension 30 mL  30 mL Oral Q4H PRN Gillermo Murdoch, NP      . divalproex (DEPAKOTE) DR tablet 1,000 mg  1,000 mg Oral BID Gillermo Murdoch, NP   1,000 mg at 09/16/19 2952  . gabapentin (NEURONTIN) capsule 600 mg  600 mg Oral TID Roselind Messier, MD       . ibuprofen (ADVIL) tablet 600 mg  600 mg Oral Q6H PRN Roselind Messier, MD   600 mg at 09/15/19 2130  . magnesium hydroxide (MILK OF MAGNESIA) suspension 30 mL  30 mL Oral Daily PRN Gillermo Murdoch, NP      . melatonin tablet 10 mg  10 mg Oral QHS Roselind Messier, MD   10 mg at 09/15/19 2130  . nicotine (NICODERM CQ - dosed in mg/24 hours) patch 21 mg  21 mg Transdermal Daily Gillermo Murdoch, NP   21 mg at 09/16/19 8413  . nortriptyline (PAMELOR) capsule 25 mg  25 mg Oral QHS Roselind Messier, MD      . OLANZapine zydis [facility]) disintegrating tablet 15 mg  15 mg Oral QHS Roselind Messier, MD   15 mg at 09/15/19 2130  . thiamine tablet 100 mg  100 mg Oral Daily Gillermo Murdoch, NP   100 mg at 09/16/19 2440   Or  . thiamine (B-1) injection 100 mg  100 mg Intravenous Daily Gillermo Murdoch, NP        Lab Results: No results found for this or any previous visit (from the past 48 hour(s)).  Blood Alcohol level:  Lab Results  Component Value Date   ETH 212 (H) 09/05/2019   ETH 167 (H) 09/01/2019    Metabolic Disorder Labs: No results found for: HGBA1C, MPG No results found for: PROLACTIN No results found for: CHOL, TRIG, HDL, CHOLHDL, VLDL, LDLCALC  Physical Findings: AIMS:  , ,  ,  ,    CIWA:  CIWA-Ar Total: 0 COWS:     Musculoskeletal: Strength & Muscle Tone: normal  Gait & Station: left sided p ain and needs PT for ambulation Patient leans:   Psychiatric Specialty Exam: Physical Exam  Review of Systems  Blood pressure 93/68, pulse 81, temperature 97.6 F (36.4 C), temperature source Oral, resp. rate 17, height 5\' 9"  (1.753 m), weight 73.9 kg, SpO2 91 %.Body mass index is 24.06 kg/m.    Mental Status  Sleep --improved for all three cycles Cognition better with no ETOH structure and meds ADL's improved  Movements --none for now Appearance approving Rapport and eye contact normal Speech normal rate tone volume fluency Concentration and attention  improved Consciousness not clouded or fluctuant Thought process and content normal; Memory improved for remote recent and immediate Fund of knowledge average Intelligence average to above average SI and HI --contracts for safety Judgement insight reliability improving  Treatment Plan Summary:  Still with med adjustments possible discharge to [facility] ----Sat Sunday   Roselind Messier, MD 09/16/2019, 2:57 PM

## 2019-09-16 NOTE — Plan of Care (Signed)
Patient is cooperative with treatment.  He denies SI,HI and AVH. Patient had questions about medication changes but he was medication compliant and he affect is appropriate.Visible in the milieu talking with staff and peers.Support and encouragement given. He is currently in bed resting quietly at this time.

## 2019-09-16 NOTE — Plan of Care (Signed)
°  Problem: Group Participation °Goal: STG - Patient will engage in groups without prompting or encouragement from LRT x3 group sessions within 5 recreation therapy group sessions °Description: STG - Patient will engage in groups without prompting or encouragement from LRT x3 group sessions within 5 recreation therapy group sessions °Outcome: Progressing °  °

## 2019-09-16 NOTE — Progress Notes (Signed)
Pt redirected from making inappropriate comments to staff, states, "I need something from you, but you ain't gonna be able to give it to me in here." Pt then stares inappropriately at male staff.

## 2019-09-16 NOTE — BHH Counselor (Signed)
Patient denied at [facility] per Hollace Hayward.  She reports, "the major issue appears to be mental health and not substance related.  I have concerns about the level of rage at the surface level and may not be safe to put him in the home with 8 others".    CSW received an additional voicemail that stated "perhaps if he completed a 28 day program he would be more appropriate.  His alcohol does not seem severe enough".   Penni Homans, MSW, LCSW 09/16/2019 11:42 AM

## 2019-09-17 MED ORDER — NALTREXONE HCL 50 MG PO TABS: 50.0000 mg | ORAL_TABLET | Freq: Every day | ORAL | 1 refills | Status: DC

## 2019-09-17 MED ORDER — OLANZAPINE 15 MG PO TBDP: 15.0000 mg | ORAL_TABLET | Freq: Every day | ORAL | 1 refills | Status: DC

## 2019-09-17 MED ORDER — NORTRIPTYLINE HCL 25 MG PO CAPS: 25.0000 mg | ORAL_CAPSULE | Freq: Every day | ORAL | 1 refills | Status: DC

## 2019-09-17 MED ORDER — MELATONIN 10 MG PO TABS: 10.0000 mg | ORAL_TABLET | Freq: Every day | ORAL | 1 refills | Status: DC

## 2019-09-17 MED ORDER — NALTREXONE HCL 50 MG PO TABS
25.0000 mg | ORAL_TABLET | Freq: Every day | ORAL | Status: DC
  Administered 2019-09-17: 25 mg via ORAL
  Filled 2019-09-17 (×2): qty 1

## 2019-09-17 MED ORDER — GABAPENTIN 300 MG PO CAPS: 600.0000 mg | ORAL_CAPSULE | Freq: Three times a day (TID) | ORAL | 1 refills | Status: DC

## 2019-09-17 MED ORDER — NALTREXONE HCL 50 MG PO TABS: 50.0000 mg | ORAL_TABLET | Freq: Every day | ORAL | Status: DC

## 2019-09-17 MED ORDER — DIVALPROEX SODIUM 500 MG PO DR TAB: 1000.0000 mg | DELAYED_RELEASE_TABLET | Freq: Two times a day (BID) | ORAL | 1 refills | Status: DC

## 2019-09-17 MED ORDER — NALTREXONE HCL 50 MG PO TABS: 25.0000 mg | ORAL_TABLET | Freq: Every day | ORAL | 0 refills | Status: DC

## 2019-09-17 MED ORDER — THIAMINE HCL 100 MG PO TABS: 100.0000 mg | ORAL_TABLET | Freq: Every day | ORAL | 1 refills | Status: DC

## 2019-09-17 NOTE — Plan of Care (Signed)
°  Problem: Education: °Goal: Knowledge of [facility] Education information/materials will improve °Outcome: Progressing °Goal: Emotional status will improve °Outcome: Progressing °Goal: Mental status will improve °Outcome: Progressing °Goal: Verbalization of understanding the information provided will improve °Outcome: Progressing °  °Problem: Activity: °Goal: Interest or engagement in activities will improve °Outcome: Progressing °Goal: Sleeping patterns will improve °Outcome: Progressing °  °Problem: Coping: °Goal: Ability to verbalize frustrations and anger appropriately will improve °Outcome: Progressing °Goal: Ability to demonstrate self-control will improve °Outcome: Progressing °  °Problem: Health Behavior/Discharge Planning: °Goal: Identification of resources available to assist in meeting health care needs will improve °Outcome: Progressing °Goal: Compliance with treatment plan for underlying cause of condition will improve °Outcome: Progressing °  °Problem: Physical Regulation: °Goal: Ability to maintain clinical measurements within normal limits will improve °Outcome: Progressing °  °Problem: Safety: °Goal: Periods of time without injury will increase °Outcome: Progressing °  °Problem: Education: °Goal: Knowledge of disease or condition will improve °Outcome: Progressing °Goal: Understanding of discharge needs will improve °Outcome: Progressing °  °Problem: Health Behavior/Discharge Planning: °Goal: Ability to identify changes in lifestyle to reduce recurrence of condition will improve °Outcome: Progressing °Goal: Identification of resources available to assist in meeting health care needs will improve °Outcome: Progressing °  °Problem: Physical Regulation: °Goal: Complications related to the disease process, condition or treatment will be avoided or minimized °Outcome: Progressing °  °Problem: Safety: °Goal: Ability to remain free from injury will improve °Outcome: Progressing °  °Problem:  Education: °Goal: Ability to make informed decisions regarding treatment will improve °Outcome: Progressing °  °Problem: Coping: °Goal: Coping ability will improve °Outcome: Progressing °  °Problem: Health Behavior/Discharge Planning: °Goal: Identification of resources available to assist in meeting health care needs will improve °Outcome: Progressing °  °Problem: Medication: °Goal: Compliance with prescribed medication regimen will improve °Outcome: Progressing °  °Problem: Self-Concept: °Goal: Ability to disclose and discuss suicidal ideas will improve °Outcome: Progressing °Goal: Will verbalize positive feelings about self °Outcome: Progressing °  °

## 2019-09-17 NOTE — Plan of Care (Signed)
Pt denies depression, anxiety, SI, HI and AVH. Pt was educated on care plan and verbalizes understanding. Torrie Mayers RN Problem: Education: Goal: Knowledge of [facility] Education information/materials will improve Outcome: Progressing Goal: Emotional status will improve Outcome: Progressing Goal: Mental status will improve Outcome: Progressing Goal: Verbalization of understanding the information provided will improve Outcome: Progressing   Problem: Activity: Goal: Interest or engagement in activities will improve Outcome: Progressing Goal: Sleeping patterns will improve Outcome: Progressing   Problem: Coping: Goal: Ability to verbalize frustrations and anger appropriately will improve Outcome: Progressing Goal: Ability to demonstrate self-control will improve Outcome: Progressing   Problem: Health Behavior/Discharge Planning: Goal: Identification of resources available to assist in meeting health care needs will improve Outcome: Progressing Goal: Compliance with treatment plan for underlying cause of condition will improve Outcome: Progressing   Problem: Physical Regulation: Goal: Ability to maintain clinical measurements within normal limits will improve Outcome: Progressing   Problem: Safety: Goal: Periods of time without injury will increase Outcome: Progressing   Problem: Education: Goal: Knowledge of disease or condition will improve Outcome: Progressing Goal: Understanding of discharge needs will improve Outcome: Progressing   Problem: Health Behavior/Discharge Planning: Goal: Ability to identify changes in lifestyle to reduce recurrence of condition will improve Outcome: Progressing Goal: Identification of resources available to assist in meeting health care needs will improve Outcome: Progressing   Problem: Physical Regulation: Goal: Complications related to the disease process, condition or treatment will be avoided or minimized Outcome:  Progressing   Problem: Safety: Goal: Ability to remain free from injury will improve Outcome: Progressing   Problem: Education: Goal: Ability to make informed decisions regarding treatment will improve Outcome: Progressing   Problem: Coping: Goal: Coping ability will improve Outcome: Progressing   Problem: Health Behavior/Discharge Planning: Goal: Identification of resources available to assist in meeting health care needs will improve Outcome: Progressing   Problem: Medication: Goal: Compliance with prescribed medication regimen will improve Outcome: Progressing   Problem: Self-Concept: Goal: Ability to disclose and discuss suicidal ideas will improve Outcome: Progressing Goal: Will verbalize positive feelings about self Outcome: Progressing

## 2019-09-17 NOTE — Progress Notes (Signed)
Pt is ready to be discharged. Pt cannot find a personal ride to leave. The staff is trying to find someone to transport. Pt is irritated and upset that the dc is a process. Torrie Mayers RN

## 2019-09-17 NOTE — BHH Suicide Risk Assessment (Signed)
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 rate 18, height 5\' 9"  (1.753 m), weight 73.9 kg, SpO2 97 %.Body mass index is 24.06 kg/m.    Mental status  Already written into discharge summary the same day                                                    Mental Status Per Nursing Assessment::   On Admission:  Suicidal ideation indicated by patient  At discharge he contracted for safety      Demographic Factors:  Estranged from family, homeless, no job or income, no social support revolving door ETOH admits ---  Loss Factors:  Abandonment feelings from mom and dad Loss of full regular ambulation--   Historical Factors: Revolving door admits to ER  Dual diagnosis of bipolar disorder   Risk Reduction Factors:   Kept in hospital longer ---now will be in [facility], several meds working together for his mood and sobriety --referral to clinics AA and therapy   Continued Clinical Symptoms:   Partial remission of bipolar, anxiety  Sober for now --longest in a long while   Cognitive Features That Contribute To Risk:   Unknown stages of cognition change and or dementia from ETOH or other factors ---poor decision making and all when intoxicated and its sequelae    Suicide Risk:   Low to low moderate at discharge Due to chronic issues, however contracts for  safety and feels better with medications and pending support at [facility]    Follow-up Information    [facility] Follow up.   Specialty: Addiction Medicine Contact information: [address] [place] [phone]               Plan Of Care/Follow-up recommendations:   [facility], vocational counseling, psych med mgt outpatient ---continued program including AA    [phone], MD 09/17/2019, 1:27 PM

## 2019-09-17 NOTE — Progress Notes (Signed)
°  [facility] Adult Case Management Discharge Plan :  Will you be returning to the same living situation after discharge:  No. Patient states he will be staying with his cousin until he goes to the [facility] on 09/18/19.  At discharge, do you have transportation home?: No. Patient will be taking a cab to his cousins home. [address] [place], [place] [address]  Do you have the ability to pay for your medications: No.  Release of information consent forms completed and in the chart;  Patient's signature needed at discharge.  Patient to Follow up at: [facility] [address] [place], [place] [address].    Next level of care provider has access to [facility] Link:no  Safety Planning and Suicide Prevention discussed: Yes,  Discussed with patient   Have you used any form of tobacco in the last 30 days? (Cigarettes, Smokeless Tobacco, Cigars, and/or Pipes): Yes  Has patient been referred to the Quitline?: Patient refused referral  Patient has been referred for addiction treatment: Yes. Patient had a referral to [place]. Patient plans on going to the [facility]. CSW contacted [facility] and patient has not setup his interview to be accepted into the home. Patient stated his grandfather did everything for him, but was unable to provide a number for the grandfather to verify.   Susa Simmonds, LCSWA 09/17/2019, 4:53 PM

## 2019-09-17 NOTE — BHH Group Notes (Signed)
LCSW Group Therapy 09/17/19 2:15-2:50 PM   Type of Therapy and Topic:  Group Therapy:  Setting Goals   Participation Level:  Active   Description of Group: In this process group, patients discussed using strengths to work toward goals and address challenges.  Patients identified two positive things about themselves and one goal they were working on.  Patients were given the opportunity to share openly and support each other's plan for self-empowerment.  The group discussed the value of gratitude and were encouraged to have a daily reflection of positive characteristics or circumstances.  Patients were encouraged to identify a plan to utilize their strengths to work on current challenges and goals.   Therapeutic Goals 1. Patient will verbalize personal strengths/positive qualities and relate how these can assist with achieving desired personal goals 2. Patients will verbalize affirmation of peers plans for personal change and goal setting 3. Patients will explore the value of gratitude and positive focus as related to successful achievement of goals 4. Patients will verbalize a plan for regular reinforcement of personal positive qualities and circumstances.   Summary of Patient Progress: Patient checked into group feeling good. Patient identified the definition of goals. Patients was given the opportunity to share openly and support other group members' plan for self-empowerment. Patient verbalized personal strength and how they relate to achieving the desired goal. Patient was able to identify positive goals to work towards when she returns home. Patient was able to tell the group his specific goals to remaining sober and walking better. Patient was able to identify time frames and spoke positivity about his discharge plan and goals.        Therapeutic Modalities Cognitive Behavioral Therapy Motivational Interviewing

## 2019-09-17 NOTE — Discharge Summary (Signed)
Physician Discharge Summary Note  Patient:  John [name] is an 39 y.o., male MRN:  [id number] DOB:  April 10, 1980 Patient phone:  [phone] (home)  Patient address:   [address] [place] [address],  Total Time spent with patient: greater than one hour   Date of Admission:  09/09/2019 Date of Discharge:  09/17/19  Reason for Admission:  Worsening depression mixed bipolar traits against background of revolving door admits for ETOH dependence, intoxication and withdrawals, not able to contract for safety   Principal Problem:   Bipolar mixed  ETOH dependence, withdrawal intoxication Generalized anxiety  Chronic pain  Adjustment Disorder mixed emotions conduct Phase of life problems     Discharge Diagnoses:  Same    Past Psychiatric History revolving door ER admits and such for ETOH intoxication and management --dual diagnosis problems   Past Medical History:  Past Medical History:  Diagnosis Date  . ASD (atrial septal defect)   . Bacterial infection due to H. pylori   . Hypertension   . Kidney stones     Past Surgical History:  Procedure Laterality Date  . ASD REPAIR    . FOOT SURGERY Right   . KNEE SURGERY Right    Family History: History reviewed. No pertinent family history. Family Psychiatric  History:   Discussed before  Social History:  Social History   Substance and Sexual Activity  Alcohol Use Not Currently   Comment: Last consumed Friday 06/10/19, pt states he is "an alcoholic" and "wants to drink"     Social History   Substance and Sexual Activity  Drug Use Not Currently    Social History   Socioeconomic History  . Marital status: Single    Spouse name: Not on file  . Number of children: Not on file  . Years of education: Not on file  . Highest education level: Not on file  Occupational History  . Not on file  Tobacco Use  . Smoking status: Current Every Day Smoker    Types: Cigarettes  . Smokeless tobacco: Never Used  . Tobacco  comment: down to 2 cigarettes a day  Vaping Use  . Vaping Use: Never used  Substance and Sexual Activity  . 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 Drug use: Not Currently  . Sexual activity: Yes    Birth control/protection: None  Other Topics Concern  . Not on file  Social History Narrative  . Not on file   Social Determinants of Health   Financial Resource Strain: High Risk  . Difficulty of Paying Living Expenses: Very hard  Food Insecurity: No Food Insecurity  . Worried About Programme researcher, broadcasting/film/video in the Last Year: Never true  . Ran Out of Food in the Last Year: Never true  Transportation Needs: Unmet Transportation Needs  . Lack of Transportation (Medical): Yes  . Lack of Transportation (Non-Medical): Yes  Physical Activity:   . Days of Exercise per Week:   . Minutes of Exercise per Session:   Stress:   . Feeling of Stress :   Social Connections: Moderately Isolated  . Frequency of Communication with Friends and Family: Never  . Frequency of Social Gatherings with Friends and Family: Never  . Attends Religious Services: 1 to 4 times per year  . Active Member of Clubs or Organizations: Yes  . Attends Banker Meetings: 1 to 4 times per year  .  Marital Status: Never married    Hospital Course:  Patient admitted for 8 days     Physical Findings: AIMS:  , ,  ,  ,   Not formally done by me  CIWA:  CIWA-Ar Total: 0 COWS:     Musculoskeletal: Strength & Muscle Tone: same Gait & Station: same  Patient leans:   Psychiatric Specialty Exam: Physical Exam  Review of Systems  Blood pressure 110/77, pulse 84, temperature 97.7 F (36.5 C), temperature source Oral, resp. rate 18, height 5\' 9"  (1.753 m), weight 73.9 kg, SpO2 97 %.Body mass index is 24.06 kg/m.  Mental Status  Has improved with appearance and Self care Feels better, more animated and focused \\Concentration  are  improved, Affect and mood better More stable ---Consciousness not clouded or fluctuant-- No frank psychosis for Thought process or content  Not as anxious,  Movements normal  Ambulation --left leg injury causes limp  PT did not finish or complete their eval -------Fund of knowledge and intelligence average to above ------Judgement insight reliability fair  SI and HI --now contracts for safey   Sleep improved ADL's better less needy  Cognition gradually better with sobriety ROM --okay   Speech normal rate tone volume fluency Abstraction improving \\Memory  remote recent and immediate okay through general questions                                                         Have you used any form of tobacco in the last 30 days? (Cigarettes, Smokeless Tobacco, Cigars, and/or Pipes): Yes  Has this patient used any form of tobacco in the last 30 days? (Cigarettes, Smokeless Tobacco, Cigars, and/or Pipes) Yes,   None   Blood Alcohol level:  Lab Results  Component Value Date   ETH 212 (H) 09/05/2019   ETH 167 (H) 09/01/2019    Metabolic Disorder Labs:  No results found for: HGBA1C, MPG No results found for: PROLACTIN No results found for: CHOL, TRIG, HDL, CHOLHDL, VLDL, LDLCALC  See Psychiatric Specialty Exam and Suicide Risk Assessment completed by Attending Physician prior to discharge.  Discharge destination:   [place] house via [place] [facility]   Is patient on multiple antipsychotic therapies at discharge:  None      Has Patient had three or more failed trials of antipsychotic monotherapy by history:  Not known   Recommended Plan for Multiple Antipsychotic Therapies:  Outpatient community health possible ACT team    Allergies as of 09/17/2019      Reactions   Penicillins    Did it involve swelling of the face/tongue/throat, SOB, or low BP? No Did it involve sudden or severe rash/hives, skin peeling, or any reaction on the inside of your mouth or nose?  No Did you need to seek medical attention at a hospital or doctor's office? Unknown When did it last happen? If all above answers are "NO", may proceed with cephalosporin use.      Medication List    STOP taking these medications   ketorolac 10 MG tablet Commonly known as: TORADOL     TAKE these medications     Indication  divalproex 500 MG DR tablet Commonly known as: Depakote Take 2 tablets (1,000 mg total) by mouth 2 (two) times daily. What changed: how much to take  Indication: Depressive Phase of Manic-Depression,  Manic Phase of Manic-Depression   gabapentin 300 MG capsule Commonly known as: NEURONTIN Take 2 capsules (600 mg total) by mouth 3 (three) times daily. What changed: how much to take  Indication: Abuse or Misuse of Alcohol, Alcohol Withdrawal Syndrome, Disease of the Peripheral Nerves, Social Anxiety Disorder   Melatonin 10 MG Tabs Take 10 mg by mouth at bedtime. What changed:   medication strength  how much to take  Indication: Depression, Trouble Sleeping   naltrexone 50 MG tablet Commonly known as: DEPADE Take 1 tablet (50 mg total) by mouth daily. 1/2 tab for 5-7 days with meals then one whole tab with meals daily  Indication: Abuse or Misuse of Alcohol, Opioid Dependence   nortriptyline 25 MG capsule Commonly known as: PAMELOR Take 1 capsule (25 mg total) by mouth at bedtime.  Indication: Chronic Pain, Depression, Depressive Phase of Manic-Depression, Trouble Sleeping, Lower Backache, Neuropathic Pain   olanzapine zydis 15 MG disintegrating tablet Commonly known as: ZYPREXA Take 1 tablet (15 mg total) by mouth at bedtime.  Indication: MIXED BIPOLAR AFFECTIVE DISORDER   thiamine 100 MG tablet Take 1 tablet (100 mg total) by mouth daily. Start taking on: September 18, 2019  Indication: Alcohol-Induced Persisting Amnestic Disorder, Deficiency of Vitamin B1       Follow-up Information    [facility] Follow up.    Specialty: Addiction Medicine Contact information: [address] [place] [address] [phone]               Follow-up recommendations:   Follow [facility] guidelines Take meds regularly    Comments:   No active SI HI or plans at discharge   Signed: Roselind Messier, MD 09/17/2019, 1:35 PM

## 2019-09-17 NOTE — BHH Counselor (Signed)
5:00 PM   CSW spoke with patient about the information she received about the [facility] and an interview was needed. CSW also told patient he wouldn't be able to just show up at the house until the house speaks about him living in the home. Patient stated he does not know anything about that and that his grandfather handled everything and that he will be going tomorrow. CSW told patient the house manager was not familiar with his name and that he needs to complete the interview first. Patient stated he is fine and he is definitely going tomorrow. Patient was not able to give CSW a number for his grandfather to verify who he spoke too at the [facility]. CSW and Nursing staff had difficulty getting an address and name of the cousins home he would be staying with. Patient stated he believes his cousins name is John [name].

## 2019-09-17 NOTE — BHH Counselor (Signed)
Phone Call  [place], [address] [place], [place] [address] 4:47 PM  CSW contacted the [facility] to follow-up with patients care when discharged. CSW spoke with Susy Frizzle the [place]ar[place] manager and he stated he is not aware of the patient coming to the [facility] tomorrow. Matt stated that he would have to setup an interview first so the house can vote on him staying. Susy Frizzle stated they talk about paying rent and finances. Susy Frizzle stated that he can do a phone interview or in person interview. Matt stated he is available tonight before 6:00 PM.

## 2019-09-17 NOTE — Progress Notes (Signed)
Pt denies SI, HI and AVH. Pt received home meds, belongings, prescriptions and dc packet. Pt was educated on dc plan and verbalizes understanding. Pt was dc to [facility]. Torrie Mayers RN

## 2019-09-17 NOTE — Tx Team (Signed)
Interdisciplinary Treatment and Diagnostic Plan Update 09/17/2019 Time of Session: 11:39 AM John [name] MRN: [id number]  Principal Diagnosis: <principal problem not specified>  Secondary Diagnoses: Active Problems:   Bipolar 1 disorder (HCC)   Current Medications:           Current Facility-Administered Medications  Medication Dose Route Frequency Provider Last Rate Last Admin  . acetaminophen (TYLENOL) tablet 650 mg  650 mg Oral Q6H PRN Gillermo Murdoch, NP   650 mg at 09/10/19 2143  . alum & mag hydroxide-simeth (MAALOX/MYLANTA) 200-200-20 MG/5ML suspension 30 mL  30 mL Oral Q4H PRN Gillermo Murdoch, NP      . divalproex (DEPAKOTE) DR tablet 1,000 mg  1,000 mg Oral BID Gillermo Murdoch, NP   1,000 mg at 09/11/19 0734  . DULoxetine (CYMBALTA) DR capsule 30 mg  30 mg Oral Daily Gillermo Murdoch, NP   30 mg at 09/11/19 0734  . gabapentin (NEURONTIN) capsule 300 mg  300 mg Oral TID Gillermo Murdoch, NP   300 mg at 09/11/19 1238  . magnesium hydroxide (MILK OF MAGNESIA) suspension 30 mL  30 mL Oral Daily PRN Gillermo Murdoch, NP      . melatonin tablet 5 mg  5 mg Oral QHS Gillermo Murdoch, NP   5 mg at 09/10/19 2143  . nicotine (NICODERM CQ - dosed in mg/24 hours) patch 21 mg  21 mg Transdermal Daily Gillermo Murdoch, NP   21 mg at 09/11/19 0736  . OLANZapine zydis (ZYPREXA) disintegrating tablet 5 mg  5 mg Oral QHS Gillermo Murdoch, NP   5 mg at 09/10/19 2143  . thiamine tablet 100 mg  100 mg Oral Daily Gillermo Murdoch, NP   100 mg at 09/11/19 2423   Or  . thiamine (B-1) injection 100 mg  100 mg Intravenous Daily Gillermo Murdoch, NP       PTA Medications:        Medications Prior to Admission  Medication Sig Dispense Refill Last Dose  . divalproex (DEPAKOTE) 500 MG DR tablet Take 1 tablet (500 mg total) by mouth 2 (two) times daily. 60 tablet 0   . gabapentin (NEURONTIN) 300 MG capsule Take 1 capsule (300 mg total) by mouth 3  (three) times daily. (Patient not taking: Reported on 09/02/2019) 90 capsule 2   . ketorolac (TORADOL) 10 MG tablet Take 1 tablet (10 mg total) by mouth every 6 (six) hours as needed. (Patient not taking: Reported on 09/02/2019) 12 tablet 0   . melatonin 5 MG TABS Take 5 mg by mouth at bedtime. (Patient not taking: Reported on 09/05/2019)       Patient Stressors: Financial difficulties Health problems Medication change or noncompliance Substance abuse Traumatic event  Patient Strengths: Active sense of humor Capable of independent living Communication skills  Treatment Modalities: Medication Management, Group therapy, Case management,  1 to 1 session with clinician, Psychoeducation, Recreational therapy.   Physician Treatment Plan for Primary Diagnosis: <principal problem not specified> Long Term Goal(s):     Short Term Goals:    Medication Management: Evaluate patient's response, side effects, and tolerance of medication regimen.  Therapeutic Interventions: 1 to 1 sessions, Unit Group sessions and Medication administration.  Evaluation of Outcomes: Progressing  Physician Treatment Plan for Secondary Diagnosis: Active Problems:   Bipolar 1 disorder (HCC)  Long Term Goal(s):     Short Term Goals:       Medication Management: Evaluate patient's response, side effects, and tolerance of medication regimen.  Therapeutic Interventions: 1 to 1  sessions, Unit Group sessions and Medication administration.  Evaluation of Outcomes: Progressing   RN Treatment Plan for Primary Diagnosis: <principal problem not specified> Long Term Goal(s): Knowledge of disease and therapeutic regimen to maintain health will improve  Short Term Goals: Ability to remain free from injury will improve, Ability to verbalize frustration and anger appropriately will improve, Ability to demonstrate self-control, Ability to participate in decision making will improve, Ability to verbalize  feelings will improve, Ability to disclose and discuss suicidal ideas, Ability to identify and develop effective coping behaviors will improve and Compliance with prescribed medications will improve  Medication Management: RN will administer medications as ordered by provider, will assess and evaluate patient's response and provide education to patient for prescribed medication. RN will report any adverse and/or side effects to prescribing provider.  Therapeutic Interventions: 1 on 1 counseling sessions, Psychoeducation, Medication administration, Evaluate responses to treatment, Monitor vital signs and CBGs as ordered, Perform/monitor CIWA, COWS, AIMS and Fall Risk screenings as ordered, Perform wound care treatments as ordered.  Evaluation of Outcomes: Progressing   LCSW Treatment Plan for Primary Diagnosis: <principal problem not specified> Long Term Goal(s): Safe transition to appropriate next level of care at discharge, Engage patient in therapeutic group addressing interpersonal concerns.  Short Term Goals: Engage patient in aftercare planning with referrals and resources, Increase social support, Increase ability to appropriately verbalize feelings, Increase emotional regulation, Facilitate acceptance of mental health diagnosis and concerns, Facilitate patient progression through stages of change regarding substance use diagnoses and concerns, Identify triggers associated with mental health/substance abuse issues and Increase skills for wellness and recovery  Therapeutic Interventions: Assess for all discharge needs, 1 to 1 time with Social worker, Explore available resources and support systems, Assess for adequacy in community support network, Educate family and significant other(s) on suicide prevention, Complete Psychosocial Assessment, Interpersonal group therapy.  Evaluation of Outcomes: Progressing   Progress in Treatment: Attending groups: Yes. Participating in groups:  Yes. Taking medication as prescribed: Yes. Toleration medication: Yes. Family/Significant other contact made: No, will contact:  None. Patient declined Patient understands diagnosis: Yes. Discussing patient identified problems/goals with staff: Yes. Medical problems stabilized or resolved: No. Denies suicidal/homicidal ideation: No. Issues/concerns per patient self-inventory: No. Other:   New problem(s) identified: No, Describe:  None  New Short Term/Long Term Goal(s): medication stabilization, elimination of SI thoughts, development of comprehensive mental wellness plan   Patient Goals:  "Work on self" Plans to go to [employer], go to [employer], and take classes to get license back. Patient spoke about his grandfather contributing money for him to go to the [employer]. Patient would also like outpatient physical therapy.   Discharge Plan or Barriers: The plan is for patient to discharge and seek outpatient therapy and be referred to [facility]. Patient also plans on getting himself back on his feet.   Reason for Continuation of Hospitalization: Depression Suicidal ideation  Estimated Length of Stay: 1-7 Days   Attendees: Patient: 09/17/2019 11:39 AM  Physician: Rodney Langton 09/17/2019 11:39 AM  Nursing:  09/17/2019 11:39 AM  RN Care Manager: Meagan 09/17/2019 11:39 AM  Social Worker:  Susa Simmonds  09/17/2019 11:39 AM  Recreational Therapist:  09/17/2019 11:39 AM  Other:  09/17/2019 11:39 AM  Other:  09/17/2019 11:39 AM  Other: 09/17/2019 11:39 AM    Scribe for Treatment Team: Susa Simmonds, LCSWA 09/17/2019 1:18 PM

## 2019-09-17 NOTE — Progress Notes (Signed)
Patient is pleasant and easy to engage. Received meds and tolerated without incident. He has been very active on the unit engaging well with others peers. He attended group and actively participated.  He denied SI/HI/AH/VH depression and anxiety on this encounter.  He remains safe with 15 minute safety rounds and informed to contact staff with any concerns.    Cleo Butler-Nichoson, LPN

## 2019-09-19 ENCOUNTER — Emergency Department
Admission: EM | Admit: 2019-09-19 | Discharge: 2019-09-22 | Disposition: A | Discharge status: Other Acute Inpatient Hospital | Payer: Self-pay | Attending: Emergency Medicine | Admitting: Emergency Medicine

## 2019-09-19 ENCOUNTER — Other Ambulatory Visit: Payer: Self-pay

## 2019-09-19 DIAGNOSIS — F1010 Alcohol abuse, uncomplicated: Secondary | ICD-10-CM

## 2019-09-19 DIAGNOSIS — Z79899 Other long term (current) drug therapy: Secondary | ICD-10-CM | POA: Insufficient documentation

## 2019-09-19 DIAGNOSIS — F1994 Other psychoactive substance use, unspecified with psychoactive substance-induced mood disorder: Secondary | ICD-10-CM | POA: Diagnosis present

## 2019-09-19 DIAGNOSIS — M79605 Pain in left leg: Secondary | ICD-10-CM | POA: Diagnosis present

## 2019-09-19 DIAGNOSIS — G8929 Other chronic pain: Secondary | ICD-10-CM | POA: Diagnosis present

## 2019-09-19 DIAGNOSIS — R45851 Suicidal ideations: Secondary | ICD-10-CM

## 2019-09-19 DIAGNOSIS — M549 Dorsalgia, unspecified: Secondary | ICD-10-CM | POA: Diagnosis present

## 2019-09-19 DIAGNOSIS — IMO0001 Reserved for inherently not codable concepts without codable children: Secondary | ICD-10-CM | POA: Diagnosis present

## 2019-09-19 DIAGNOSIS — F17200 Nicotine dependence, unspecified, uncomplicated: Secondary | ICD-10-CM | POA: Diagnosis present

## 2019-09-19 DIAGNOSIS — Z20822 Contact with and (suspected) exposure to covid-19: Secondary | ICD-10-CM | POA: Insufficient documentation

## 2019-09-19 DIAGNOSIS — F10129 Alcohol abuse with intoxication, unspecified: Secondary | ICD-10-CM | POA: Diagnosis present

## 2019-09-19 DIAGNOSIS — F17210 Nicotine dependence, cigarettes, uncomplicated: Secondary | ICD-10-CM | POA: Insufficient documentation

## 2019-09-19 DIAGNOSIS — F319 Bipolar disorder, unspecified: Secondary | ICD-10-CM | POA: Diagnosis present

## 2019-09-19 DIAGNOSIS — I10 Essential (primary) hypertension: Secondary | ICD-10-CM | POA: Insufficient documentation

## 2019-09-19 DIAGNOSIS — Q211 Atrial septal defect: Secondary | ICD-10-CM | POA: Insufficient documentation

## 2019-09-19 LAB — COMPREHENSIVE METABOLIC PANEL
ALT: 15 U/L (ref 0–44)
AST: 20 U/L (ref 15–41)
Albumin: 3.8 g/dL (ref 3.5–5.0)
Alkaline Phosphatase: 78 U/L (ref 38–126)
Anion gap: 10 (ref 5–15)
BUN: 5 mg/dL — ABNORMAL LOW (ref 6–20)
CO2: 28 mmol/L (ref 22–32)
Calcium: 9.2 mg/dL (ref 8.9–10.3)
Chloride: 106 mmol/L (ref 98–111)
Creatinine, Ser: 0.85 mg/dL (ref 0.61–1.24)
GFR calc Af Amer: >60 mL/min (ref >60)
GFR calc non Af Amer: >60 mL/min (ref >60)
Glucose, Bld: 113 mg/dL — ABNORMAL HIGH (ref 70–99)
Potassium: 3.2 mmol/L — ABNORMAL LOW (ref 3.5–5.1)
Sodium: 144 mmol/L (ref 135–145)
Total Bilirubin: 0.6 mg/dL (ref 0.3–1.2)
Total Protein: 7.5 g/dL (ref 6.5–8.1)

## 2019-09-19 LAB — CBC
HCT: 41.3 % (ref 39.0–52.0)
Hemoglobin: 13.6 g/dL (ref 13.0–17.0)
MCH: 30.3 pg (ref 26.0–34.0)
MCHC: 32.9 g/dL (ref 30.0–36.0)
MCV: 92 fL (ref 80.0–100.0)
Platelets: 196 10*3/uL (ref 150–400)
RBC: 4.49 MIL/uL (ref 4.22–5.81)
RDW: 15 % (ref 11.5–15.5)
WBC: 7.7 10*3/uL (ref 4.0–10.5)
nRBC: 0 % (ref 0.0–0.2)

## 2019-09-19 LAB — URINE DRUG SCREEN, QUALITATIVE (ARMC ONLY)
Amphetamines, Ur Screen: NOT DETECTED
Barbiturates, Ur Screen: NOT DETECTED
Benzodiazepine, Ur Scrn: NOT DETECTED
Cannabinoid 50 Ng, Ur ~~LOC~~: NOT DETECTED
Cocaine Metabolite,Ur ~~LOC~~: NOT DETECTED
MDMA (Ecstasy)Ur Screen: NOT DETECTED
Methadone Scn, Ur: NOT DETECTED
Opiate, Ur Screen: NOT DETECTED
Phencyclidine (PCP) Ur S: NOT DETECTED
Tricyclic, Ur Screen: POSITIVE — AB

## 2019-09-19 LAB — ETHANOL: Alcohol, Ethyl (B): 185 mg/dL — ABNORMAL HIGH (ref <10)

## 2019-09-19 LAB — ACETAMINOPHEN LEVEL: Acetaminophen (Tylenol), Serum: <10 ug/mL — ABNORMAL LOW (ref 10–30)

## 2019-09-19 LAB — SARS CORONAVIRUS 2 BY RT PCR (HOSPITAL ORDER, PERFORMED IN ~~LOC~~ HOSPITAL LAB): SARS Coronavirus 2: NEGATIVE

## 2019-09-19 LAB — SALICYLATE LEVEL: Salicylate Lvl: <7 mg/dL — ABNORMAL LOW (ref 7.0–30.0)

## 2019-09-19 MED ORDER — ACETAMINOPHEN 325 MG PO TABS
650.0000 mg | ORAL_TABLET | Freq: Four times a day (QID) | ORAL | Status: DC | PRN
  Administered 2019-09-19 – 2019-09-21 (×3): 650 mg via ORAL
  Filled 2019-09-19 (×2): qty 2

## 2019-09-19 MED ORDER — POTASSIUM CHLORIDE CRYS ER 20 MEQ PO TBCR
20.0000 meq | EXTENDED_RELEASE_TABLET | Freq: Once | ORAL | Status: AC
  Administered 2019-09-19: 20 meq via ORAL
  Filled 2019-09-19: qty 1

## 2019-09-19 NOTE — ED Notes (Signed)
Pt brought into ED BHU via sally port and wand with metal detector for safety by [employer] officer. Patient oriented to unit/care area: Pt informed of unit policies and procedures.  Informed that, for their safety, care areas are designed for safety and monitored by security cameras at all times. Patient verbalizes understanding, and verbal contract for safety obtained.Pt shown to their room.  

## 2019-09-19 NOTE — ED Notes (Signed)
Patient states he has been drinking all day.
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
## 2019-09-20 MED ORDER — GABAPENTIN 300 MG PO CAPS
600.0000 mg | ORAL_CAPSULE | Freq: Three times a day (TID) | ORAL | Status: DC
  Administered 2019-09-20 – 2019-09-21 (×6): 600 mg via ORAL
  Filled 2019-09-20 (×6): qty 2

## 2019-09-20 MED ORDER — THIAMINE HCL 100 MG PO TABS
100.0000 mg | ORAL_TABLET | Freq: Every day | ORAL | Status: DC
  Administered 2019-09-20 – 2019-09-21 (×2): 100 mg via ORAL
  Filled 2019-09-20 (×2): qty 1

## 2019-09-20 MED ORDER — NALTREXONE HCL 50 MG PO TABS
25.0000 mg | ORAL_TABLET | Freq: Every day | ORAL | Status: DC
  Filled 2019-09-20 (×2): qty 1

## 2019-09-20 MED ORDER — OLANZAPINE 5 MG PO TBDP
15.0000 mg | ORAL_TABLET | Freq: Every day | ORAL | Status: DC
  Administered 2019-09-21: 15 mg via ORAL
  Filled 2019-09-20 (×3): qty 1

## 2019-09-20 MED ORDER — DIVALPROEX SODIUM 500 MG PO DR TAB
1000.0000 mg | DELAYED_RELEASE_TABLET | Freq: Two times a day (BID) | ORAL | Status: DC
  Administered 2019-09-20 – 2019-09-21 (×4): 1000 mg via ORAL
  Filled 2019-09-20 (×4): qty 2

## 2019-09-20 MED ORDER — MELATONIN 5 MG PO TABS
10.0000 mg | ORAL_TABLET | Freq: Every day | ORAL | Status: DC
  Administered 2019-09-20 – 2019-09-21 (×2): 10 mg via ORAL
  Filled 2019-09-20 (×2): qty 2

## 2019-09-20 NOTE — BH Assessment (Signed)
Assessment Note  John [name] is an 39 y.o. male who presents to the ED via [facility]. Per the initial triage note, "Patient arrived by [facility]. Per EMS patient was climbing fence and cut left wrist. When EMS arrived patient told them, he wanted to die and was having SI thoughts.  Patient currently denies SI, AVH but is having HI thoughts toward a "black guy at [employer]". Patient contracts for safety with this Clinical research associate.  Bandage removed by this Clinical research associate. Patient has a LAC that is about 2 inches cross ways in length across wrist. No bleeding noted. Area cleaned and covered with a bandage".   Writer was able to assess patient, however, patient provided limited information. Patient reported "I turned to the alcohol. It's the damn alcohol". Patient denies SI/HI/AH/VH. Patient didn't provide further information to this Clinical research associate.   This case was staffed with Annice Pih, NP. Patient should be reassessed on day shift to see if he is appropriate for admission.    Diagnosis: Alcohol Use D/O, BiPolar D/O  Past Medical History:  Past Medical History:  Diagnosis Date  . ASD (atrial septal defect)   . Bacterial infection due to H. pylori   . Hypertension   . Kidney stones     Past Surgical History:  Procedure Laterality Date  . ASD REPAIR    . FOOT SURGERY Right   . KNEE SURGERY Right     Family History: History reviewed. No pertinent family history.  Social History:  reports that he has been smoking cigarettes. He has never used smokeless tobacco. He reports previous alcohol use. He reports previous drug use.  Additional Social History:     CIWA: CIWA-Ar BP: (!) 169/100 Pulse Rate: 100 COWS:    Allergies:  Allergies  Allergen Reactions  . Penicillins     Did it involve swelling of the face/tongue/throat, SOB, or low BP? No Did it involve sudden or severe rash/hives, skin peeling, or any reaction on the inside of your mouth or nose? No Did you need to seek  medical attention at a hospital or doctor's office? Unknown When did it last happen? If all above answers are "NO", may proceed with cephalosporin use.    Home Medications: (Not in a hospital admission)   OB/GYN Status:  No LMP for male patient.  General Assessment Data Assessment unable to be completed: Yes Admission Status: Voluntary         Mental Status Report Appearance/Hygiene: Unremarkable Eye Contact: Good Motor Activity: Freedom of movement Speech: Logical/coherent Level of Consciousness: Alert Mood: Anxious, Irritable Affect: Apathetic                Advance Directives (For Healthcare) Does Patient Have a Medical Advance Directive?: No Would patient like information on creating a medical advance directive?: No - Patient declined          Disposition:     On Site Evaluation by:   Reviewed with Physician:    Willene Hatchet, Msc.,[facility] 09/20/2019 3:38 AM

## 2019-09-20 NOTE — ED Notes (Signed)
IVC/  PENDING  PLACEMENT 
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
## 2019-09-20 NOTE — ED Notes (Addendum)
Pt co headache and ask for medication. Rates pain 8/10.

## 2019-09-21 NOTE — BH Assessment (Signed)
Referral information for Psychiatric Hospitalization faxed to;   [facility] [phone]),   . [facility] [phone]), unable to be admitted due to IVC  . [facility] [phone]),  . [facility] [phone]), Unable to go due to IVC  . [facility] [phone] or [phone])  . [facility] [phone]),   . [facility] [phone] -or- [phone]),   . [facility] [phone])  . [facility] [phone]), Patient have no insurance and will need to pay a several thousand dollar deposit.

## 2019-09-21 NOTE — BH Assessment (Signed)
Patient has been accepted to [facility].  Patient assigned to the adult campus Accepting physician is Dr. Estill Cotta.  Call report to [phone] or [phone].  Representative was [employer].   ER Staff is aware of it:  Lu[place], ER Secretary  Dr. Larinda Buttery, ER MD   Bed will be available tomorrow (09/22/2019), after 7am.   Address: [address] [place], [place] [address]

## 2019-09-21 NOTE — ED Provider Notes (Signed)
Emergency Medicine Observation Re-evaluation Note  John [name] is a 39 y.o. male, seen on rounds today.  Pt initially presented to the ED for complaints of Mental Health Problem Currently, the patient is calm, resting comfortably.  Physical Exam  BP (!) 115/89 (BP Location: Right Arm)   Pulse 63   Temp 98.7 F (37.1 C) (Oral)   Resp 18   SpO2 96%  Physical Exam  GEN: No acute distress, resting comfortably HEENT: NCAT Neck: Supple Lungs: Normal WOB, no resp distress Neuro: AOx3, no focal deficits Psych: Calm  ED Course / MDM  EKG:    I have reviewed the labs performed to date as well as medications administered while in observation.  Recent changes in the last 24 hours include none, labs reviewed. Plan  Current plan is for psych evaluation and disposition. Patient is under full IVC at this time.   Shaune Pollack, MD 09/21/19 1109

## 2019-09-21 NOTE — ED Notes (Signed)
IVC to go to [facility] 7/29 am

## 2019-09-21 NOTE — ED Notes (Signed)
Patient changed his mind now he wants a shower, shower supplies given to patient at this time.
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
## 2019-09-22 NOTE — ED Provider Notes (Signed)
Emergency Medicine Observation Re-evaluation Note  John [name] is a 39 y.o. male, seen on rounds today.  Pt initially presented to the ED for complaints of Mental Health Problem Currently, the patient is sitting on edge of bed in NAD.  Physical Exam  BP 115/84 (BP Location: Right Arm)   Pulse 96   Temp 98.7 F (37.1 C) (Oral)   Resp 18   SpO2 96%  Physical Exam  Gen:  no acute distress Resp:  no difficulty breathing, no accessory muscle usage Neuro:  moving all four extremities Psych:  calm and cooperative   ED Course / MDM  EKG:    I have reviewed the labs performed to date as well as medications administered while in observation.  Recent changes in the last 24 hours include none. Plan  Current plan is for psych/SW dispo. Patient is under full IVC at this time.   Willy Eddy, MD 09/22/19 [phone]

## 2019-10-04 ENCOUNTER — Emergency Department: Payer: Self-pay

## 2019-10-04 ENCOUNTER — Other Ambulatory Visit: Payer: Self-pay

## 2019-10-04 ENCOUNTER — Encounter: Payer: Self-pay | Admitting: Emergency Medicine

## 2019-10-04 DIAGNOSIS — W502XXA Accidental twist by another person, initial encounter: Secondary | ICD-10-CM | POA: Insufficient documentation

## 2019-10-04 DIAGNOSIS — I10 Essential (primary) hypertension: Secondary | ICD-10-CM | POA: Insufficient documentation

## 2019-10-04 DIAGNOSIS — S93402A Sprain of unspecified ligament of left ankle, initial encounter: Secondary | ICD-10-CM | POA: Insufficient documentation

## 2019-10-04 DIAGNOSIS — Z79891 Long term (current) use of opiate analgesic: Secondary | ICD-10-CM | POA: Insufficient documentation

## 2019-10-04 DIAGNOSIS — Y9389 Activity, other specified: Secondary | ICD-10-CM | POA: Insufficient documentation

## 2019-10-04 DIAGNOSIS — Y9289 Other specified places as the place of occurrence of the external cause: Secondary | ICD-10-CM | POA: Insufficient documentation

## 2019-10-04 DIAGNOSIS — Z79899 Other long term (current) drug therapy: Secondary | ICD-10-CM | POA: Insufficient documentation

## 2019-10-04 DIAGNOSIS — Y998 Other external cause status: Secondary | ICD-10-CM | POA: Insufficient documentation

## 2019-10-04 MED ORDER — IBUPROFEN 800 MG PO TABS
800.0000 mg | ORAL_TABLET | Freq: Once | ORAL | Status: AC
  Administered 2019-10-04: 800 mg via ORAL
  Filled 2019-10-04: qty 1

## 2019-10-04 NOTE — ED Triage Notes (Addendum)
Pt to triage via w/c with no distress noted; st lost his balance "twisting" left ankle; denies any other c/o or injuries; +ETOH

## 2019-10-05 ENCOUNTER — Emergency Department
Admission: EM | Admit: 2019-10-05 | Discharge: 2019-10-06 | Disposition: A | Discharge status: Home / Self Care | Payer: Self-pay | Attending: Student in an Organized Health Care Education/Training Program | Admitting: Student in an Organized Health Care Education/Training Program

## 2019-10-05 DIAGNOSIS — S93402A Sprain of unspecified ligament of left ankle, initial encounter: Secondary | ICD-10-CM

## 2019-10-05 DIAGNOSIS — T1490XA Injury, unspecified, initial encounter: Secondary | ICD-10-CM

## 2019-10-05 NOTE — Consult Note (Signed)
[facility] Face-to-Face Psychiatry Consult   Reason for Consult:  Revolving door admits for issues with ETOH and homelessness Referring Physician:  ER MD  Patient Identification: John [name] MRN:  [id number] Principal Diagnosis:   ETOH dependence  Major depression moderate Personality disorder  Cocaine dependence At times malingering      Diagnosis:   Same   Total Time spent with patient:  20-30    Subjective:   John [name] is a 39 y.o. male patient admitted with  Same issues revolving door admits for ETOH dependence, withdrawal and intoxication     Spent time in our unit for psych then failed and sabotaged [facility] ---then returned then referred out to [facility] for two weeks ---went to enabling dysfunctional cousin and then again now relapsed.   He is currently not suicidal or homicidal but might change his story when he sees that he has to go a shelter.    Referred to [facility] for Social transition because he is med cleared and Psych cleared   HPI:  As above   Past Psychiatric History:    TNTC visits to our ER  ---last two episodes was admitted to our psych and [facility] where he gains momentum but then immediately relapses at discharge.    No other new medical except minor ankle issue already seen by ER MD   BAL was 185  Does not need CIWA this time   Risk to Self: Suicidal Ideation: No Suicidal Intent: No Is patient at risk for suicide?: No Suicidal Plan?: No Specify Current Suicidal Plan: None Noted Access to Means: No What has been your use of drugs/alcohol within the last 12 months?: Alcohol and Cocaine How many times?: 0 Other Self Harm Risks: None noted Triggers for Past Attempts: None known Intentional Self Injurious Behavior: None Risk to Others: Homicidal Ideation: No Thoughts of Harm to Others: No Current Homicidal Intent: No Current Homicidal Plan: No Access to Homicidal Means: No Identified Victim: None Noted History of harm to others?: No Assessment of  Violence: None Noted Violent Behavior Description: None Noted Does patient have access to weapons?: No Criminal Charges Pending?: No Describe Pending Criminal Charges: None noted Does patient have a court date: No Prior Inpatient Therapy: Prior Inpatient Therapy: Yes Prior Therapy Dates: 09/01/19 Prior Therapy Facilty/Provider(s): [facility] Reason for Treatment: Bipolar I; ETOH abuse Prior Outpatient Therapy: Prior Outpatient Therapy: No Does patient have an ACCT team?: No Does patient have Intensive In-House Services?  : No Does patient have [facility] services? : No Does patient have P4CC services?: No  Past Medical History:  Past Medical History:  Diagnosis Date  . ASD (atrial septal defect)   . Bacterial infection due to H. pylori   . Hypertension   . Kidney stones     Past Surgical History:  Procedure Laterality Date  . ASD REPAIR    . FOOT SURGERY Right   . KNEE SURGERY Right    Family History: No family history on file. Family Psychiatric  History: Mom with bipolar disorder substance dependence    Social History: --homeless revolving --door admits    Social History   Substance and Sexual Activity  Alcohol Use Yes   Comment: Last consumed Friday 06/10/19, pt states he is "an alcoholic" and "wants to drink"     Social History   Substance and Sexual Activity  Drug Use Not Currently    Social History   Socioeconomic History  . Marital status: Single    Spouse name:  Not on file  . Number of children: Not on file  . Years of education: Not on file  . Highest education level: Not on file  Occupational History  . Not on file  Tobacco Use  . Smoking status: Current Every Day Smoker    Types: Cigarettes  . Smokeless tobacco: Never Used  . Tobacco comment: down to 2 cigarettes a day  Vaping Use  . Vaping Use: Never used  Substance and Sexual Activity  . Alcohol use: Yes    Comment: Last consumed Friday 06/10/19, pt states he is "an alcoholic" and "wants to  drink"  . Drug use: Not Currently  . Sexual activity: Yes    Birth control/protection: None  Other Topics Concern  . Not on file  Social History Narrative  . Not on file   Social Determinants of Health   Financial Resource Strain: High Risk  . Difficulty of Paying Living Expenses: Very hard  Food Insecurity: No Food Insecurity  . Worried About Programme researcher, broadcasting/film/video in the Last Year: Never true  . Ran Out of Food in the Last Year: Never true  Transportation Needs: Unmet Transportation Needs  . Lack of Transportation (Medical): Yes  . Lack of Transportation (Non-Medical): Yes  Physical Activity:   . Days of Exercise per Week:   . Minutes of Exercise per Session:   Stress:   . Feeling of Stress :   Social Connections: Moderately Isolated  . Frequency of Communication with Friends and Family: Never  . Frequency of Social Gatherings with Friends and Family: Never  . Attends Religious Services: 1 to 4 times per year  . Active Member of Clubs or Organizations: Yes  . Attends Banker Meetings: 1 to 4 times per year  . Marital Status: Never married   Additional Social History:---none     Allergies:   Allergies  Allergen Reactions  . Penicillins     Did it involve swelling of the face/tongue/throat, SOB, or low BP? No Did it involve sudden or severe rash/hives, skin peeling, or any reaction on the inside of your mouth or nose? No Did you need to seek medical attention at a hospital or doctor's office? Unknown When did it last happen? If all above answers are "NO", may proceed with cephalosporin use.    Labs: No results found for this or any previous visit (from the past 48 hour(s)).  No current facility-administered medications for this encounter.   Current Outpatient Medications  Medication Sig Dispense Refill  . divalproex (DEPAKOTE) 500 MG DR tablet Take 2 tablets (1,000 mg total) by mouth 2 (two) times daily. 120 tablet 1  . gabapentin (NEURONTIN) 300  MG capsule Take 2 capsules (600 mg total) by mouth 3 (three) times daily. 180 capsule 1  . melatonin 10 MG TABS Take 10 mg by mouth at bedtime. 60 tablet 1  . naltrexone (DEPADE) 50 MG tablet Take 1 tablet (50 mg total) by mouth daily. 1/2 tab for 5-7 days with meals then one whole tab with meals daily 30 tablet 1  . naltrexone (DEPADE) 50 MG tablet Take 0.5 tablets (25 mg total) by mouth daily with breakfast. 5 tablet 0  . naltrexone (DEPADE) 50 MG tablet Take 1 tablet (50 mg total) by mouth daily with breakfast. 30 tablet 1  . nortriptyline (PAMELOR) 25 MG capsule Take 1 capsule (25 mg total) by mouth at bedtime. 30 capsule 1  . OLANZapine zydis (ZYPREXA) 15 MG disintegrating tablet Take 1 tablet (  15 mg total) by mouth at bedtime. 90 tablet 1  . thiamine 100 MG tablet Take 1 tablet (100 mg total) by mouth daily. 30 tablet 1    Musculoskeletal: Strength & Muscle Tone: normal  Gait & Station: normal  Patient leans: normal   Psychiatric Specialty Exam: Physical Exam  Review of Systems  Blood pressure 120/88, pulse 80, temperature 98 F (36.7 C), temperature source Oral, resp. rate 16, height 5\' 10"  (1.778 m), weight 82.1 kg, SpO2 97 %.Body mass index is 25.97 kg/m.  Mental Status   Alert cooperative, usual baseline Rapport and eye contact normal  Ruddy complexion, disheveled state attitude These revolving door visits have become part of his culture Oriented to person place date and time Not clouded or fluctuant Concentration and attention normal Thought process and content --- Normal no frank psychosis or mania No frank depression or anxiety No frank SI HI or plans  Contracts for safety but he may change his story if he realizes he has to to a shelter or go homeless No movement problems Memory remote recent and immediate intact Fund of knowledge intelligence, cognition fund of knowledge all reducing over months and years  Judgement insight reliability all very  poor Abstract ---at times okay sometimes concrete  Cognition --at present okay Recall normal Aims not done  Assets not clear  Liabilities --all aspects of addictive personality Leans --n/a  ADL's no new change                                                        Treatment Plan Summary:  Patient with chronic ongoing -ETOH dependence, withdrawal ---at times mood problems currently stable and needs disposition   Can malinger when he knows that he has to go to a shelter or half way house  We gave him two long chances last time with long stay at [facility] and also our [facility] unit But he grossly sabotaged that disposition to [facility] by deceiving his Grandfather who was going to pay the fees there and going with a ETOH ic cousin to go drink  He then at that time came back and again since he done okay and we did not him to waste his gained efforts with some sobriety and meds --sent him to [facility] where again was discharged to the enabling dysfunctional cousin and he went on a binge  Currently [facility] AND MEDICALLY cleared Awaiting SW disposition            Disposition:  Shelter or [address] house   [facility], MD 10/05/2019 3:59 PM

## 2019-10-05 NOTE — ED Notes (Signed)
Pt given meal tray.

## 2019-10-05 NOTE — ED Notes (Signed)
Resting at presents    Awaiting TTS consult

## 2019-10-05 NOTE — ED Notes (Addendum)
BLOOD LAB AND UA SENT TO LAB.

## 2019-10-05 NOTE — ED Provider Notes (Signed)
[facility] Emergency Department Provider Note   ____________________________________________   First MD Initiated Contact with Patient 10/05/19 0719     (approximate)  I have reviewed the triage vital signs and the nursing notes.   HISTORY  Chief Complaint Ankle Pain    HPI John [name] is a 39 y.o. male patient presents with left ankle pain secondary to a twisting incident which occurred last night. Patient state he lost his balance secondary to EtOH. Denies any other injury. Patient also relates suicidal ideation but no definitive plan. Rates his ankle pain is a 10/10. Described the pain as "achy". No palliative measure for complaint. Patient has a history of internal fixation distal tibia.         Past Medical History:  Diagnosis Date  . ASD (atrial septal defect)   . Bacterial infection due to H. pylori   . Hypertension   . Kidney stones     Patient Active Problem List   Diagnosis Date Noted  . Bipolar 1 disorder (HCC) 09/09/2019  . Chronic back pain 08/10/2019  . Encounter to establish care 07/12/2019  . History of depression 07/12/2019  . Smoking 07/12/2019  . Depressive disorder 06/14/2019  . Sepsis (HCC) 04/30/2019  . Fall 04/30/2019  . Left leg pain 04/30/2019  . Orthopedic hardware present 04/30/2019  . S/P cystoscopy 04/20/19 [facility]. Hx urethroplasty for urethral trauma in 2020 04/30/2019  . Alcohol abuse with intoxication (HCC) 04/30/2019  . Acute metabolic encephalopathy 04/30/2019  . Hypoxia 04/30/2019  . Acute Femoral condyle fracture (HCC) 04/30/2019  . Severe sepsis (HCC) 04/30/2019  . Alcohol abuse 01/14/2018  . Substance induced mood disorder (HCC) 01/14/2018    Past Surgical History:  Procedure Laterality Date  . ASD REPAIR    . FOOT SURGERY Right   . KNEE SURGERY Right     Prior to Admission medications   Medication Sig Start Date End Date Taking? Authorizing Provider  divalproex (DEPAKOTE) 500 MG DR tablet  Take 2 tablets (1,000 mg total) by mouth 2 (two) times daily. 09/17/19   Roselind Messier, MD  gabapentin (NEURONTIN) 300 MG capsule Take 2 capsules (600 mg total) by mouth 3 (three) times daily. 09/17/19   Roselind Messier, MD  melatonin 10 MG TABS Take 10 mg by mouth at bedtime. 09/17/19   Roselind Messier, MD  naltrexone (DEPADE) 50 MG tablet Take 1 tablet (50 mg total) by mouth daily. 1/2 tab for 5-7 days with meals then one whole tab with meals daily 09/17/19   Roselind Messier, MD  naltrexone (DEPADE) 50 MG tablet Take 0.5 tablets (25 mg total) by mouth daily with breakfast. 09/17/19   Roselind Messier, MD  naltrexone (DEPADE) 50 MG tablet Take 1 tablet (50 mg total) by mouth daily with breakfast. 09/22/19   Roselind Messier, MD  nortriptyline (PAMELOR) 25 MG capsule Take 1 capsule (25 mg total) by mouth at bedtime. 09/17/19   Roselind Messier, MD  OLANZapine zydis (ZYPREXA) 15 MG disintegrating tablet Take 1 tablet (15 mg total) by mouth at bedtime. 09/17/19   Roselind Messier, MD  thiamine 100 MG tablet Take 1 tablet (100 mg total) by mouth daily. 09/18/19   Roselind Messier, MD    Allergies Penicillins  No family history on file.  Social History Social History   Tobacco Use  . Smoking status: Current Every Day Smoker    Types: Cigarettes  . Smokeless tobacco: Never Used  . Tobacco comment: down to 2 cigarettes a day  Vaping  Use  . Vaping Use: Never used  Substance Use Topics  . 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 Drug use: Not Currently    Review of Systems Constitutional: No fever/chills Eyes: No visual changes. ENT: No sore throat. Cardiovascular: Denies chest pain. Respiratory: Denies shortness of breath. Gastrointestinal: No abdominal pain.  No nausea, no vomiting.  No diarrhea.  No constipation. Genitourinary: Negative for dysuria. Musculoskeletal: Left ankle pain.. Skin: Negative for  rash. Neurological: Negative for headaches, focal weakness or numbness. Psychiatric:  Bipolar, depression, and substance abuse. Endocrine:  Hypertension. Allergic/Immunilogical: Penicillin. ____________________________________________   PHYSICAL EXAM:  VITAL SIGNS: ED Triage Vitals  Enc Vitals Group     BP 10/04/19 2150 (!) 120/92     Pulse Rate 10/04/19 2150 98     Resp 10/04/19 2150 18     Temp 10/04/19 2150 98 F (36.7 C)     Temp Source 10/04/19 2150 Oral     SpO2 10/04/19 2150 99 %     Weight 10/04/19 2148 181 lb (82.1 kg)     Height 10/04/19 2148 5\' 10"  (1.778 m)     Head Circumference --      Peak Flow --      Pain Score 10/04/19 2148 10     Pain Loc --      Pain Edu? --      Excl. in GC? --     Constitutional: Alert and oriented. Well appearing and in no acute distress. Hematological/Lymphatic/Immunilogical: No cervical lymphadenopathy. Cardiovascular: Normal rate, regular rhythm. Grossly normal heart sounds.  Good peripheral circulation. Respiratory: Normal respiratory effort.  No retractions. Lungs CTAB. Musculoskeletal: No obvious deformity to the left ankle. Patient is moderate guarding palpation medial aspect of the left ankle.  Neurologic:  Normal speech and language. No gross focal neurologic deficits are appreciated. No gait instability. Skin:  Skin is warm, dry and intact. No rash noted. No abrasion or ecchymosis. Psychiatric: Mood and affect are normal. Speech and behavior are normal.  ____________________________________________   LABS (all labs ordered are listed, but only abnormal results are displayed)  Labs Reviewed - No data to display ____________________________________________  EKG   ____________________________________________  RADIOLOGY  ED MD interpretation:    Official radiology report(s): DG Ankle Complete Left  Result Date: 10/04/2019 CLINICAL DATA:  Loss of balance twisting left ankle EXAM: LEFT ANKLE COMPLETE - 3+ VIEW  COMPARISON:  None. FINDINGS: The patient is status post ORIF with intramedullary rod fixation of the tibia. There is diffuse osteopenia. The ankle mortise however appears to be congruent. Posttraumatic arthropathy noted at the tibiotalar joint. A small ankle joint effusion is noted. IMPRESSION: No definite acute osseous abnormality. Electronically Signed   By: 12/04/2019 M.D.   On: 10/04/2019 22:26    ____________________________________________   PROCEDURES  Procedure(s) performed (including Critical Care):  Procedures   ____________________________________________   INITIAL IMPRESSION / ASSESSMENT AND PLAN / ED COURSE  As part of my medical decision making, I reviewed the following data within the electronic MEDICAL RECORD NUMBER     Patient presents with left ankle pain secondary to a twisting incident. Patient also voices suicidal ideation but no definite pain. Discussed x-ray findings with patient. Patient placed in a ankle splint and will reconsult psych for further evaluation.    John [name] was evaluated in Emergency Department on 10/05/2019 for the symptoms described in the history of present  illness. He was evaluated in the context of the global COVID-19 pandemic, which necessitated consideration that the patient might be at risk for infection with the SARS-CoV-2 virus that causes COVID-19. Institutional protocols and algorithms that pertain to the evaluation of patients at risk for COVID-19 are in a state of rapid change based on information released by regulatory bodies including the CDC and federal and state organizations. These policies and algorithms were followed during the patient's care in the ED.       ____________________________________________   FINAL CLINICAL IMPRESSION(S) / ED DIAGNOSES  Final diagnoses:  Sprain of left ankle, unspecified ligament, initial encounter     ED Discharge Orders    None       Note:  This document was prepared using Dragon  voice recognition software and may include unintentional dictation errors.    Joni Reining, PA-C 10/05/19 [id number]    Willy Eddy, MD 10/06/19 [phone]

## 2019-10-05 NOTE — BH Assessment (Signed)
Assessment Note  John [name] is an 39 y.o. male. Pt presented to the ED voluntarily. Per triage note, pt presented with no distress noted; sts lost his balance "twisting" left ankle; denies any other c/o or injuries; +ETOH.  Patient was sleeping in bed upon this writers arrival. The patient had a disheveled appearance and was drowsy. The patient reported feelings of shakiness/withdrawal. Patient states that she he was recently discharged from [facility]; however, he did not follow up with his medications or get his medications. The patient had a flat and sullen affect while explaining his current circumstances. The patient stated that hed lived with his cousin for less than a week, however he left due to a heated argument with his cousin. Patient is not currently endorsing SI, HI, or AV/H.  Patient did admit to recently using cocaine in addition to alcohol.   Diagnosis: MDD, recurrent, moderate  Past Medical History:  Past Medical History:  Diagnosis Date   ASD (atrial septal defect)    Bacterial infection due to H. pylori    Hypertension    Kidney stones     Past Surgical History:  Procedure Laterality Date   ASD REPAIR     FOOT SURGERY Right    KNEE SURGERY Right     Family History: No family history on file.  Social History:  reports that he has been smoking cigarettes. He has never used smokeless tobacco. He reports current alcohol use. He reports previous drug use.  Additional Social History:  Alcohol / Drug Use Pain Medications: See PTA Prescriptions: See PTA History of alcohol / drug use?: Yes Negative Consequences of Use: Personal relationships Withdrawal Symptoms: Diarrhea, Fever / Chills Substance #1 Name of Substance 1: Alcohol Substance #2 Name of Substance 2: Cocaine  CIWA: CIWA-Ar BP: 120/88 Pulse Rate: 80 COWS:    Allergies:  Allergies  Allergen Reactions   Penicillins     Did it involve swelling of the face/tongue/throat, SOB, or low BP?  No Did it involve sudden or severe rash/hives, skin peeling, or any reaction on the inside of your mouth or nose? No Did you need to seek medical attention at a hospital or doctor's office? Unknown When did it last happen? If all above answers are NO, may proceed with cephalosporin use.    Home Medications: (Not in a hospital admission)   OB/GYN Status:  No LMP for male patient.  General Assessment Data Location of Assessment: [facility] ED TTS Assessment: In system Is this a Tele or Face-to-Face Assessment?: Face-to-Face Is this an Initial Assessment or a Re-assessment for this encounter?: Initial Assessment Patient Accompanied by:: N/A Language Other than English: No Living Arrangements: Homeless/Shelter What gender do you identify as?: Male Date Telepsych consult ordered in [facility]: 10/05/19 Time Telepsych consult ordered in [facility]: 0803 Marital status: Single Maiden name: n/a Pregnancy Status: No Living Arrangements: Other (Comment) Can pt return to current living arrangement?: No Admission Status: Voluntary Is patient capable of signing voluntary admission?: Yes Referral Source: Self/Family/Friend Insurance type: None  Medical Screening Exam [facility] Walk-in ONLY) Medical Exam completed: Yes  Crisis Care Plan Living Arrangements: Other (Comment) Legal Guardian: Other: (Self) Name of Psychiatrist: None reported  Name of Therapist: None reported   Education Status Is patient currently in school?: No Highest grade of school patient has completed: 10th and recieved GED Is the patient employed, unemployed or receiving disability?: Unemployed  Risk to self with the past 6 months Suicidal Ideation: No Has patient been a risk to  self within the past 6 months prior to admission? : Yes Suicidal Intent: No Has patient had any suicidal intent within the past 6 months prior to admission? : Yes Is patient at risk for suicide?: No Suicidal Plan?: No Has patient had any suicidal  plan within the past 6 months prior to admission? : No Specify Current Suicidal Plan: None Noted Access to Means: No What has been your use of drugs/alcohol within the last 12 months?: Alcohol and Cocaine Previous Attempts/Gestures: No How many times?: 0 Other Self Harm Risks: None noted Triggers for Past Attempts: None known Intentional Self Injurious Behavior: None Family Suicide History: Unknown Recent stressful life event(s): Conflict (Comment) (Argument with live-in cousin) Persecutory voices/beliefs?: No Depression: Yes Depression Symptoms: Feeling worthless/self pity Substance abuse history and/or treatment for substance abuse?: Yes Suicide prevention information given to non-admitted patients: Not applicable  Risk to Others within the past 6 months Homicidal Ideation: No Does patient have any lifetime risk of violence toward others beyond the six months prior to admission? : No Thoughts of Harm to Others: No Current Homicidal Intent: No Current Homicidal Plan: No Access to Homicidal Means: No Identified Victim: None Noted History of harm to others?: No Assessment of Violence: None Noted Violent Behavior Description: None Noted Does patient have access to weapons?: No Criminal Charges Pending?: No Describe Pending Criminal Charges: None noted Does patient have a court date: No Is patient on probation?: No  Psychosis Hallucinations: None noted Delusions: None noted  Mental Status Report Appearance/Hygiene: Disheveled, In scrubs Eye Contact: Good Motor Activity: Freedom of movement Speech: Logical/coherent Level of Consciousness: Drowsy Mood: Empty, Sullen, Worthless, low self-esteem Affect: Apathetic Anxiety Level: Minimal Thought Processes: Coherent, Relevant Judgement: Impaired Orientation: Appropriate for developmental age Obsessive Compulsive Thoughts/Behaviors: None  Cognitive Functioning Concentration: Good Memory: Recent Intact, Remote Impaired Is  patient IDD: No Insight: Poor Impulse Control: Good Appetite: Fair Have you had any weight changes? : No Change Sleep: Unable to Assess Vegetative Symptoms: None  ADLScreening [facility]) Patient's cognitive ability adequate to safely complete daily activities?: Yes Patient able to express need for assistance with ADLs?: Yes Independently performs ADLs?: Yes (appropriate for developmental age)  Prior Inpatient Therapy Prior Inpatient Therapy: Yes Prior Therapy Dates: 09/01/19 Prior Therapy Facilty/Provider(s): [facility] Reason for Treatment: Bipolar I; ETOH abuse  Prior Outpatient Therapy Prior Outpatient Therapy: No Does patient have an ACCT team?: No Does patient have Intensive In-House Services?  : No Does patient have [facility] services? : No Does patient have P4CC services?: No  ADL Screening (condition at time of admission) Patient's cognitive ability adequate to safely complete daily activities?: Yes Is the patient deaf or have difficulty hearing?: No Does the patient have difficulty seeing, even when wearing glasses/contacts?: No Does the patient have difficulty concentrating, remembering, or making decisions?: No Patient able to express need for assistance with ADLs?: Yes Does the patient have difficulty dressing or bathing?: No Independently performs ADLs?: Yes (appropriate for developmental age) Communication: Independent Dressing (OT): Independent Grooming: Independent Feeding: Independent Bathing: Independent Toileting: Independent In/Out Bed: Independent Walks in Home: Independent Does the patient have difficulty walking or climbing stairs?: No Weakness of Legs: None Weakness of Arms/Hands: None  Home Assistive Devices/Equipment Home Assistive Devices/Equipment: None  Therapy Consults (therapy consults require a physician order) PT Evaluation Needed: No OT Evalulation Needed: No SLP Evaluation Needed: No Abuse/Neglect Assessment  (Assessment to be complete while patient is alone) Physical Abuse: Denies Verbal Abuse: Denies Sexual Abuse: Denies Exploitation of  patient/patient's resources: Denies Self-Neglect: Denies Values / Beliefs Cultural Requests During Hospitalization: None Spiritual Requests During Hospitalization: None Consults Spiritual Care Consult Needed: No Transition of Care Team Consult Needed: No Advance Directives (For Healthcare) Does Patient Have a Medical Advance Directive?: No Would patient like information on creating a medical advance directive?: No - Patient declined          Disposition: Per psych MD, Dr. Smith Robert, Currently [facility] AND MEDICALLY cleared Awaiting SW disposition.   Disposition Initial Assessment Completed for this Encounter: Yes  On Site Evaluation by:   Reviewed with Physician:    Foy Guadalajara 10/05/2019 5:13 PM

## 2019-10-05 NOTE — ED Notes (Signed)
See triage note  Presents with left foot/ankle pain  States he twisted his ankle after stepping in a pot hole  No swelling noted  Good pulses  Also states he is having S/I thoughts w/o plan  Wants to speak to some one

## 2019-10-05 NOTE — ED Notes (Signed)
This tech and Brett,EDT assisted pt to changed into psych scrubs. Pts belongings include Wallace Cullens shirt, blue jeans, brown belt, blue underwear, white socks, black slides, cigarette half box, black wallet, lighter, $1x2= $2 and green bookback.

## 2019-10-05 NOTE — ED Notes (Signed)

## 2019-10-05 NOTE — Discharge Instructions (Signed)
Follow discharge care instruction Wear ankle splint for 3 to 5 days as needed.

## 2019-10-06 NOTE — ED Notes (Signed)
Pt given belongings to change into before d/c.

## 2019-10-06 NOTE — ED Notes (Signed)
Patient up to restroom.

## 2019-10-06 NOTE — TOC Initial Note (Signed)
Transition of Care [facility]) - Initial/Assessment Note    Patient Details  Name: John [name] MRN: [id number] Date of Birth: 1980/11/20  Transition of Care [facility]) CM/SW Contact:    Spiceland Cellar, RN Phone Number: 10/06/2019, 9:05 AM  Clinical Narrative:                 Patient well known to [facility] and has numerous copies of resources available. Patient unable to utilize local homeless shelters due to noncompliance with rules. Only available option is allied churches and no guarantee of bed availability. Will provide additional copy of resources for patient to follow up on.         Patient Goals and CMS Choice        Expected Discharge Plan and Services                                                Prior Living Arrangements/Services                       Activities of Daily Living Home Assistive Devices/Equipment: None ADL Screening (condition at time of admission) Patient's cognitive ability adequate to safely complete daily activities?: Yes Is the patient deaf or have difficulty hearing?: No Does the patient have difficulty seeing, even when wearing glasses/contacts?: No Does the patient have difficulty concentrating, remembering, or making decisions?: No Patient able to express need for assistance with ADLs?: Yes Does the patient have difficulty dressing or bathing?: No Independently performs ADLs?: Yes (appropriate for developmental age) Communication: Independent Dressing (OT): Independent Grooming: Independent Feeding: Independent Bathing: Independent Toileting: Independent In/Out Bed: Independent Walks in Home: Independent Does the patient have difficulty walking or climbing stairs?: No Weakness of Legs: None Weakness of Arms/Hands: None  Permission Sought/Granted                  Emotional Assessment              Admission diagnosis:  Mental Health  EMS Patient Active Problem List   Diagnosis Date Noted  . Bipolar 1  disorder (HCC) 09/09/2019  . Chronic back pain 08/10/2019  . Encounter to establish care 07/12/2019  . History of depression 07/12/2019  . Smoking 07/12/2019  . Depressive disorder 06/14/2019  . Sepsis (HCC) 04/30/2019  . Fall 04/30/2019  . Left leg pain 04/30/2019  . Orthopedic hardware present 04/30/2019  . S/P cystoscopy 04/20/19 [facility]. Hx urethroplasty for urethral trauma in 2020 04/30/2019  . Alcohol abuse with intoxication (HCC) 04/30/2019  . Acute metabolic encephalopathy 04/30/2019  . Hypoxia 04/30/2019  . Acute Femoral condyle fracture (HCC) 04/30/2019  . Severe sepsis (HCC) 04/30/2019  . Alcohol abuse 01/14/2018  . Substance induced mood disorder (HCC) 01/14/2018   PCP:  Patient, No Pcp Per Pharmacy:   [facility] - [place], [place] - [address] [place] [address] Phone: [phone] Fax: [phone]     Social Determinants of Health (SDOH) Interventions    Readmission Risk Interventions No flowsheet data found.

## 2019-10-06 NOTE — TOC Transition Note (Signed)
Transition of Care [facility]) - CM/SW Discharge Note   Patient Details  Name: John [name] MRN: [id number] Date of Birth: October 21, 1980  Transition of Care [facility]) CM/SW Contact:  Marina Goodell Phone Number: [phone] 10/06/2019, 2:31 PM   Clinical Narrative:     CSW picked up patient's [facility] from [facility] and gave them to patient.  RN/CM contacted [facility] [employer] for transport to cousin's home.  Patient was sitting outside of ED lobby.        Patient Goals and CMS Choice        Discharge Placement                       Discharge Plan and Services                                     Social Determinants of Health (SDOH) Interventions     Readmission Risk Interventions No flowsheet data found.

## 2019-10-06 NOTE — TOC Progression Note (Addendum)
Transition of Care [facility]) - Progression Note    Patient Details  Name: John [name] MRN: [id number] Date of Birth: 1980/11/05  Transition of Care [facility]) CM/SW Contact  Marina Goodell Phone Number: [phone] 10/06/2019, 11:56 AM  Clinical Narrative:     CSW spoke with patient and gave him resource list.  CSW asked patient if he would rather go to [employer] in [place] or [address] in [place].  Patient stated he wanted to go to his cousin's house where his stuff is and then he'll walk to DSS.  CSW explained to patient that transportation would on ly be able to take him to one location.  Patient verbalized understanding. CSW called and left voicemail for Hinda Lenis (cousin) [phone].  CSW spoke with EDP for status update.  CSW let the patient know that I will try to contact Ag[facility] and if I was unable to contact them could arrange transportation to a homeless shelter or DSS. Patient verbalized understanding.          Expected Discharge Plan and Services                                                 Social Determinants of Health (SDOH) Interventions    Readmission Risk Interventions No flowsheet data found.

## 2019-10-07 ENCOUNTER — Emergency Department
Admission: EM | Admit: 2019-10-07 | Discharge: 2019-10-08 | Disposition: A | Discharge status: Home / Self Care | Payer: Self-pay | Attending: Emergency Medicine | Admitting: Emergency Medicine

## 2019-10-07 ENCOUNTER — Other Ambulatory Visit: Payer: Self-pay

## 2019-10-07 ENCOUNTER — Emergency Department: Payer: Self-pay

## 2019-10-07 DIAGNOSIS — R000 Tachycardia, unspecified: Secondary | ICD-10-CM | POA: Insufficient documentation

## 2019-10-07 DIAGNOSIS — F1010 Alcohol abuse, uncomplicated: Secondary | ICD-10-CM | POA: Insufficient documentation

## 2019-10-07 DIAGNOSIS — F17210 Nicotine dependence, cigarettes, uncomplicated: Secondary | ICD-10-CM | POA: Insufficient documentation

## 2019-10-07 DIAGNOSIS — Z79899 Other long term (current) drug therapy: Secondary | ICD-10-CM | POA: Insufficient documentation

## 2019-10-07 DIAGNOSIS — Z20822 Contact with and (suspected) exposure to covid-19: Secondary | ICD-10-CM | POA: Insufficient documentation

## 2019-10-07 DIAGNOSIS — I10 Essential (primary) hypertension: Secondary | ICD-10-CM | POA: Insufficient documentation

## 2019-10-07 DIAGNOSIS — Z765 Malingerer [conscious simulation]: Secondary | ICD-10-CM

## 2019-10-07 DIAGNOSIS — R45851 Suicidal ideations: Secondary | ICD-10-CM | POA: Insufficient documentation

## 2019-10-07 DIAGNOSIS — R0602 Shortness of breath: Secondary | ICD-10-CM

## 2019-10-07 LAB — ACETAMINOPHEN LEVEL: Acetaminophen (Tylenol), Serum: <10 ug/mL — ABNORMAL LOW (ref 10–30)

## 2019-10-07 LAB — CBC
HCT: 42.4 % (ref 39.0–52.0)
Hemoglobin: 14.3 g/dL (ref 13.0–17.0)
MCH: 30.2 pg (ref 26.0–34.0)
MCHC: 33.7 g/dL (ref 30.0–36.0)
MCV: 89.6 fL (ref 80.0–100.0)
Platelets: 234 10*3/uL (ref 150–400)
RBC: 4.73 MIL/uL (ref 4.22–5.81)
RDW: 14.4 % (ref 11.5–15.5)
WBC: 8.5 10*3/uL (ref 4.0–10.5)
nRBC: 0 % (ref 0.0–0.2)

## 2019-10-07 LAB — COMPREHENSIVE METABOLIC PANEL
ALT: 15 U/L (ref 0–44)
AST: 22 U/L (ref 15–41)
Albumin: 4.3 g/dL (ref 3.5–5.0)
Alkaline Phosphatase: 78 U/L (ref 38–126)
Anion gap: 10 (ref 5–15)
BUN: 9 mg/dL (ref 6–20)
CO2: 25 mmol/L (ref 22–32)
Calcium: 8.6 mg/dL — ABNORMAL LOW (ref 8.9–10.3)
Chloride: 104 mmol/L (ref 98–111)
Creatinine, Ser: 0.81 mg/dL (ref 0.61–1.24)
GFR calc Af Amer: >60 mL/min (ref >60)
GFR calc non Af Amer: >60 mL/min (ref >60)
Glucose, Bld: 108 mg/dL — ABNORMAL HIGH (ref 70–99)
Potassium: 3.6 mmol/L (ref 3.5–5.1)
Sodium: 139 mmol/L (ref 135–145)
Total Bilirubin: 0.8 mg/dL (ref 0.3–1.2)
Total Protein: 8.3 g/dL — ABNORMAL HIGH (ref 6.5–8.1)

## 2019-10-07 LAB — URINE DRUG SCREEN, QUALITATIVE (ARMC ONLY)
Amphetamines, Ur Screen: NOT DETECTED
Barbiturates, Ur Screen: NOT DETECTED
Benzodiazepine, Ur Scrn: NOT DETECTED
Cannabinoid 50 Ng, Ur ~~LOC~~: NOT DETECTED
Cocaine Metabolite,Ur ~~LOC~~: NOT DETECTED
MDMA (Ecstasy)Ur Screen: NOT DETECTED
Methadone Scn, Ur: NOT DETECTED
Opiate, Ur Screen: NOT DETECTED
Phencyclidine (PCP) Ur S: NOT DETECTED
Tricyclic, Ur Screen: NOT DETECTED

## 2019-10-07 LAB — SARS CORONAVIRUS 2 BY RT PCR (HOSPITAL ORDER, PERFORMED IN ~~LOC~~ HOSPITAL LAB): SARS Coronavirus 2: NEGATIVE

## 2019-10-07 LAB — MAGNESIUM: Magnesium: 2 mg/dL (ref 1.7–2.4)

## 2019-10-07 LAB — SALICYLATE LEVEL: Salicylate Lvl: <7 mg/dL — ABNORMAL LOW (ref 7.0–30.0)

## 2019-10-07 LAB — ETHANOL: Alcohol, Ethyl (B): 177 mg/dL — ABNORMAL HIGH (ref <10)

## 2019-10-07 LAB — VALPROIC ACID LEVEL: Valproic Acid Lvl: 40 ug/mL — ABNORMAL LOW (ref 50.0–100.0)

## 2019-10-07 MED ORDER — MELATONIN 5 MG PO TABS
5.0000 mg | ORAL_TABLET | Freq: Every evening | ORAL | Status: DC | PRN
  Administered 2019-10-07: 5 mg via ORAL
  Filled 2019-10-07: qty 1

## 2019-10-07 MED ORDER — DIVALPROEX SODIUM 500 MG PO DR TAB
1000.0000 mg | DELAYED_RELEASE_TABLET | Freq: Every day | ORAL | Status: DC
  Administered 2019-10-07: 1000 mg via ORAL
  Filled 2019-10-07: qty 2

## 2019-10-07 NOTE — ED Notes (Signed)
Pt making inappropriate gestures towards male staff.

## 2019-10-07 NOTE — ED Notes (Signed)
First Nurse Note: Pt in lobby yelling and cursing. Pt informed that we cannot have him yelling and cursing in the lobby. Pt walked outside with wheelchair.

## 2019-10-07 NOTE — ED Notes (Signed)
Hourly rounding reveals patient sleeping in room. No complaints, stable, in no acute distress. Q15 minute rounds and monitoring via Security Cameras to continue. 

## 2019-10-07 NOTE — ED Notes (Signed)
INVOLUNTARY with all papers on chart/awaiting TTS/PSYCH consult ?

## 2019-10-07 NOTE — ED Notes (Signed)
First Nurse Note: Pt to ED via EMS for ETOH intoxication. Pt states that he wants to jump in front of a truck. Pt is homeless and has been outside all day. Pt is in NAD.

## 2019-10-07 NOTE — ED Notes (Addendum)
Pt given turkey sandwich tray and orange juice; no other needs voiced at this time. Will continue to monitor Q15 minute rounds. 

## 2019-10-07 NOTE — ED Notes (Signed)
Hourly rounding reveals patient awake in room. No complaints, stable, in no acute distress. Q15 minute rounds and monitoring via Security Cameras to continue. 

## 2019-10-07 NOTE — ED Triage Notes (Addendum)
Pt arrived via ems from a business, pt states that he is intoxicated that he has an alcohol problem, and states that he is also suicidal, thinking about walking out into traffic, knife given to security

## 2019-10-07 NOTE — ED Notes (Signed)
Report to include Situation, Background, Assessment, and Recommendations received from Amy B. RN. Patient alert and oriented, warm and dry, in no acute distress. Patient denies SI, HI, AVH and pain. Patient made aware of Q15 minute rounds and security cameras for their safety. Patient instructed to come to me with needs or concerns. 
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
## 2019-10-08 ENCOUNTER — Encounter: Payer: Self-pay | Admitting: Emergency Medicine

## 2019-10-08 ENCOUNTER — Emergency Department
Admission: EM | Admit: 2019-10-08 | Discharge: 2019-10-08 | Disposition: A | Discharge status: Other Acute Inpatient Hospital | Payer: Self-pay | Attending: Emergency Medicine | Admitting: Emergency Medicine

## 2019-10-08 DIAGNOSIS — F17210 Nicotine dependence, cigarettes, uncomplicated: Secondary | ICD-10-CM | POA: Insufficient documentation

## 2019-10-08 DIAGNOSIS — R45851 Suicidal ideations: Secondary | ICD-10-CM | POA: Diagnosis not present

## 2019-10-08 DIAGNOSIS — F1010 Alcohol abuse, uncomplicated: Secondary | ICD-10-CM

## 2019-10-08 DIAGNOSIS — Z765 Malingerer [conscious simulation]: Secondary | ICD-10-CM | POA: Diagnosis not present

## 2019-10-08 DIAGNOSIS — Z79899 Other long term (current) drug therapy: Secondary | ICD-10-CM | POA: Insufficient documentation

## 2019-10-08 DIAGNOSIS — F10920 Alcohol use, unspecified with intoxication, uncomplicated: Secondary | ICD-10-CM | POA: Insufficient documentation

## 2019-10-08 DIAGNOSIS — I10 Essential (primary) hypertension: Secondary | ICD-10-CM | POA: Insufficient documentation

## 2019-10-08 LAB — URINE DRUG SCREEN, QUALITATIVE (ARMC ONLY)
Amphetamines, Ur Screen: NOT DETECTED
Barbiturates, Ur Screen: NOT DETECTED
Benzodiazepine, Ur Scrn: NOT DETECTED
Cannabinoid 50 Ng, Ur ~~LOC~~: NOT DETECTED
Cocaine Metabolite,Ur ~~LOC~~: NOT DETECTED
MDMA (Ecstasy)Ur Screen: NOT DETECTED
Methadone Scn, Ur: NOT DETECTED
Opiate, Ur Screen: NOT DETECTED
Phencyclidine (PCP) Ur S: NOT DETECTED
Tricyclic, Ur Screen: NOT DETECTED

## 2019-10-08 NOTE — ED Notes (Signed)
2/2 belongings bags not including what patient has on left at nurses' station due to have pocket knife in it. Green bag with personal belongings and second bag from previous admission including clothes.

## 2019-10-08 NOTE — ED Notes (Signed)
Hourly rounding reveals patient sleeping in room. No complaints, stable, in no acute distress. Q15 minute rounds and monitoring via Security Cameras to continue. 

## 2019-10-08 NOTE — ED Provider Notes (Signed)
Emergency Medicine Observation Re-evaluation Note  John [name] is a 39 y.o. male, seen on rounds today.  Pt initially presented to the ED for complaints of Alcohol Intoxication and Suicidal Currently, the patient is resting.  Physical Exam  BP 103/82 (BP Location: Left Arm)   Pulse 84   Temp 97.7 F (36.5 C) (Oral)   Resp 18   Ht 5\' 10"  (1.778 m)   Wt 82.1 kg   SpO2 96%   BMI 25.97 kg/m  Physical Exam Constitutional:      General: He is not in acute distress. HENT:     Head: Atraumatic.  Eyes:     Extraocular Movements: Extraocular movements intact.     Pupils: Pupils are equal, round, and reactive to light.  Cardiovascular:     Rate and Rhythm: Normal rate.  Pulmonary:     Effort: Pulmonary effort is normal.  Abdominal:     General: There is no distension.  Skin:    General: Skin is warm and dry.  Neurological:     General: No focal deficit present.     ED Course / MDM  EKG:    I have reviewed the labs performed to date as well as medications administered while in observation.  Recent changes in the last 24 hours include continued bed search. Plan  Current plan is for inpatient detox and psychiatric care for suicidality. Patient is under full IVC at this time.   , MD 10/08/19 [phone]

## 2019-10-08 NOTE — Consult Note (Signed)
[facility] Face-to-Face Psychiatry Consult   Reason for Consult:  Psych evaluation Referring Physician:  Dr. Katrinka Blazing Patient Identification: John [name] MRN:  [id number] Principal Diagnosis: <principal problem not specified> Diagnosis:  Active Problems:   Malingerer   Total Time spent with patient: 45 minutes  Subjective:  " The shelters are full and I'm having thoughts of suicide"   HPI:  John [name], 39 y.o., male patient seen via telepsych by this provider; and chart reviewed 10/08/19.  On evaluation John [name] reports  That he has been having a difficult time and needs help with his drinking. Patient was discharged on 10/06/19 with same presentation.  Patient was given resources to various treatment facilities as well as shelters.  Patient became irate and began yelling when he was told that he may have to go to a shelter because he doesn't meet criteria for hospitalization.  10/05/19 Per, Dr. Smith Robert, admitted with  Same issues revolving door admits for ETOH dependence, withdrawal and intoxication.   Spent time in our unit for psych then failed and sabotaged [facility] ---then returned then referred out to [facility] for two weeks ---went to enabling dysfunctional cousin and then again now relapsed.  He is currently not suicidal or homicidal but might change his story when he sees that he has to go a shelter.   Referred to [facility] for Social transition because he is med cleared and Psych cleared July 2021 Per, Dr. Smith Robert, pt has had multiple ER visits but no recent recovery or psych admits.  Chronically homeless has burned bridges at many centers for staying for recovery due to personality and issues related  Risk ---vague risk to Self now.  None recently ---ongoing mainly ETOH issues --where he has no motivation for recovery Past no recent Si attempts  Depression however increasins Homeless feels discouraged   During evaluation John [name] is laying in bed he is alert/oriented x 4;  agitated/cooperative (at first); and mood congruent with affect.  Patient is speaking in a clear tone at moderate volume, and normal pace; with fair eye contact.  His thought process is coherent and relevant and focused on staying in the hospital for secondary gain; There is no indication that he is currently responding to internal/external stimuli or experiencing delusional thought content.  Patient was calm until he was told that he would be given additional resources to a shelter.  Disposition:  Patient psychiatrically cleared  Past Psychiatric History: Alcohol abuse, Bipolar disorder  Risk to Self:  yes  Risk to Others:  no Prior Inpatient Therapy:  yes Prior Outpatient Therapy:  no  Past Medical History:  Past Medical History:  Diagnosis Date  . ASD (atrial septal defect)   . Bacterial infection due to H. pylori   . Hypertension   . Kidney stones     Past Surgical History:  Procedure Laterality Date  . ASD REPAIR    . FOOT SURGERY Right   . KNEE SURGERY Right    Family History: No family history on file. Family Psychiatric  History: unknown Social History:  Social History   Substance and Sexual Activity  Alcohol Use Yes   Comment: Last consumed Friday 06/10/19, pt states he is "an alcoholic" and "wants to drink"     Social History   Substance and Sexual Activity  Drug Use Not Currently    Social History   Socioeconomic History  . Marital status: Single    Spouse name: Not on file  . Number of  children: Not on file  . Years of education: Not on file  . Highest education level: Not on file  Occupational History  . Not on file  Tobacco Use  . Smoking status: Current Every Day Smoker    Types: Cigarettes  . Smokeless tobacco: Never Used  . Tobacco comment: down to 2 cigarettes a day  Vaping Use  . Vaping Use: Never used  Substance and Sexual Activity  . Alcohol use: Yes    Comment: Last consumed Friday 06/10/19, pt states he is "an alcoholic" and "wants to  drink"  . Drug use: Not Currently  . Sexual activity: Yes    Birth control/protection: None  Other Topics Concern  . Not on file  Social History Narrative  . Not on file   Social Determinants of Health   Financial Resource Strain: High Risk  . Difficulty of Paying Living Expenses: Very hard  Food Insecurity: No Food Insecurity  . Worried About Programme researcher, broadcasting/film/videounning Out of Food in the Last Year: Never true  . Ran Out of Food in the Last Year: Never true  Transportation Needs: Unmet Transportation Needs  . Lack of Transportation (Medical): Yes  . Lack of Transportation (Non-Medical): Yes  Physical Activity:   . Days of Exercise per Week:   . Minutes of Exercise per Session:   Stress:   . Feeling of Stress :   Social Connections: Moderately Isolated  . Frequency of Communication with Friends and Family: Never  . Frequency of Social Gatherings with Friends and Family: Never  . Attends Religious Services: 1 to 4 times per year  . Active Member of Clubs or Organizations: Yes  . Attends BankerClub or Organization Meetings: 1 to 4 times per year  . Marital Status: Never married   Additional Social History:    Allergies:   Allergies  Allergen Reactions  . Penicillins     Did it involve swelling of the face/tongue/throat, SOB, or low BP? No Did it involve sudden or severe rash/hives, skin peeling, or any reaction on the inside of your mouth or nose? No Did you need to seek medical attention at a hospital or doctor's office? Unknown When did it last happen? If all above answers are "NO", may proceed with cephalosporin use.    Labs:  Results for orders placed or performed during the hospital encounter of 10/07/19 (from the past 48 hour(s))  Comprehensive metabolic panel     Status: Abnormal   Collection Time: 10/07/19  1:36 PM  Result Value Ref Range   Sodium 139 135 - 145 mmol/L   Potassium 3.6 3.5 - 5.1 mmol/L   Chloride 104 98 - 111 mmol/L   CO2 25 22 - 32 mmol/L   Glucose, Bld 108  (H) 70 - 99 mg/dL    Comment: Glucose reference range applies only to samples taken after fasting for at least 8 hours.   BUN 9 6 - 20 mg/dL   Creatinine, Ser 1.610.81 0.61 - 1.24 mg/dL   Calcium 8.6 (L) 8.9 - 10.3 mg/dL   Total Protein 8.3 (H) 6.5 - 8.1 g/dL   Albumin 4.3 3.5 - 5.0 g/dL   AST 22 15 - 41 U/L   ALT 15 0 - 44 U/L   Alkaline Phosphatase 78 38 - 126 U/L   Total Bilirubin 0.8 0.3 - 1.2 mg/dL   GFR calc non Af Amer >60 >60 mL/min   GFR calc Af Amer >60 >60 mL/min   Anion gap 10 5 - 15  Comment: Performed at [facility], [address] [place], [place] [address]  Ethanol     Status: Abnormal   Collection Time: 10/07/19  1:36 PM  Result Value Ref Range   Alcohol, Ethyl (B) 177 (H) <10 mg/dL    Comment: (NOTE) Lowest detectable limit for serum alcohol is 10 mg/dL.  For medical purposes only. Performed at [facility], [address] [place], [place] [address]   cbc     Status: None   Collection Time: 10/07/19  1:36 PM  Result Value Ref Range   WBC 8.5 4.0 - 10.5 K/uL   RBC 4.73 4.22 - 5.81 MIL/uL   Hemoglobin 14.3 13.0 - 17.0 g/dL   HCT 53.6 39 - 52 %   MCV 89.6 80.0 - 100.0 fL   MCH 30.2 26.0 - 34.0 pg   MCHC 33.7 30.0 - 36.0 g/dL   RDW 64.4 03.4 - 74.2 %   Platelets 234 150 - 400 K/uL   nRBC 0.0 0.0 - 0.2 %    Comment: Performed at [facility], [address] [place], [place] [address]  Urine Drug Screen, Qualitative     Status: None   Collection Time: 10/07/19  1:36 PM  Result Value Ref Range   Tricyclic, Ur Screen NONE DETECTED NONE DETECTED   Amphetamines, Ur Screen NONE DETECTED NONE DETECTED   MDMA (Ecstasy)Ur Screen NONE DETECTED NONE DETECTED   Cocaine Metabolite,Ur [place] NONE DETECTED NONE DETECTED   Opiate, Ur Screen NONE DETECTED NONE DETECTED   Phencyclidine (PCP) Ur S NONE DETECTED NONE DETECTED   Cannabinoid 50 Ng, Ur [place] NONE DETECTED NONE DETECTED   Barbiturates, Ur Screen NONE DETECTED NONE DETECTED    Benzodiazepine, Ur Scrn NONE DETECTED NONE DETECTED   Methadone Scn, Ur NONE DETECTED NONE DETECTED    Comment: (NOTE) Tricyclics + metabolites, urine    Cutoff 1000 ng/mL Amphetamines + metabolites, urine  Cutoff 1000 ng/mL MDMA (Ecstasy), urine              Cutoff 500 ng/mL Cocaine Metabolite, urine          Cutoff 300 ng/mL Opiate + metabolites, urine        Cutoff 300 ng/mL Phencyclidine (PCP), urine         Cutoff 25 ng/mL Cannabinoid, urine                 Cutoff 50 ng/mL Barbiturates + metabolites, urine  Cutoff 200 ng/mL Benzodiazepine, urine              Cutoff 200 ng/mL Methadone, urine                   Cutoff 300 ng/mL  The urine drug screen provides only a preliminary, unconfirmed analytical test result and should not be used for non-medical purposes. Clinical consideration and professional judgment should be applied to any positive drug screen result due to possible interfering substances. A more specific alternate chemical method must be used in order to obtain a confirmed analytical result. Gas chromatography / mass spectrometry (GC/MS) is the preferred confirm atory method. Performed at [facility], [address] [place], [place] [address]   Valproic acid level     Status: Abnormal   Collection Time: 10/07/19  1:36 PM  Result Value Ref Range   Valproic Acid Lvl 40 (L) 50.0 - 100.0 ug/mL    Comment: Performed at [facility], [address] [place], [place] [address]  Magnesium  Status: None   Collection Time: 10/07/19  1:36 PM  Result Value Ref Range   Magnesium 2.0 1.7 - 2.4 mg/dL    Comment: Performed at [facility], [address] [place], [place] [address]  Salicylate level     Status: Abnormal   Collection Time: 10/07/19  3:37 PM  Result Value Ref Range   Salicylate Lvl <7.0 (L) 7.0 - 30.0 mg/dL    Comment: Performed at [facility], [address] [place], [place] [address]  Acetaminophen level      Status: Abnormal   Collection Time: 10/07/19  3:37 PM  Result Value Ref Range   Acetaminophen (Tylenol), Serum <10 (L) 10 - 30 ug/mL    Comment: (NOTE) Therapeutic concentrations vary significantly. A range of 10-30 ug/mL  may be an effective concentration for many patients. However, some  are best treated at concentrations outside of this range. Acetaminophen concentrations >150 ug/mL at 4 hours after ingestion  and >50 ug/mL at 12 hours after ingestion are often associated with  toxic reactions.  Performed at [facility], [address] [place], [place] [address]   SARS Coronavirus 2 by RT PCR (hospital order, performed in [facility] hospital lab) Nasopharyngeal Nasopharyngeal Swab     Status: None   Collection Time: 10/07/19  4:08 PM   Specimen: Nasopharyngeal Swab  Result Value Ref Range   SARS Coronavirus 2 NEGATIVE NEGATIVE    Comment: (NOTE) SARS-CoV-2 target nucleic acids are NOT DETECTED.  The SARS-CoV-2 RNA is generally detectable in upper and lower respiratory specimens during the acute phase of infection. The lowest concentration of SARS-CoV-2 viral copies this assay can detect is 250 copies / mL. A negative result does not preclude SARS-CoV-2 infection and should not be used as the sole basis for treatment or other patient management decisions.  A negative result may occur with improper specimen collection / handling, submission of specimen other than nasopharyngeal swab, presence of viral mutation(s) within the areas targeted by this assay, and inadequate number of viral copies (<250 copies / mL). A negative result must be combined with clinical observations, patient history, and epidemiological information.  Fact Sheet for Patients:   BoilerBrush.com.cy  Fact Sheet for Healthcare Providers: https://pope.com/  This test is not yet approved or  cleared by the [place] FDA and has been authorized  for detection and/or diagnosis of SARS-CoV-2 by FDA under an Emergency Use Authorization (EUA).  This EUA will remain in effect (meaning this test can be used) for the duration of the COVID-19 declaration under Section 564(b)(1) of the Act, 21 U.S.C. section 360bbb-3(b)(1), unless the authorization is terminated or revoked sooner.  Performed at [facility], [address], [place], [place] [address]     Current Facility-Administered Medications  Medication Dose Route Frequency Provider Last Rate Last Admin  . divalproex (DEPAKOTE) DR tablet 1,000 mg  1,000 mg Oral QHS Gilles Chiquito, MD   1,000 mg at 10/07/19 2103  . melatonin tablet 5 mg  5 mg Oral QHS PRN Gilles Chiquito, MD   5 mg at 10/07/19 2103   Current Outpatient Medications  Medication Sig Dispense Refill  . divalproex (DEPAKOTE) 500 MG DR tablet Take 2 tablets (1,000 mg total) by mouth 2 (two) times daily. (Patient taking differently: Take 1,000 mg by mouth at bedtime. ) 120 tablet 1  . gabapentin (NEURONTIN) 300 MG capsule Take 2 capsules (600 mg total) by mouth 3 (three) times daily. (Patient taking differently: Take  300 mg by mouth 3 (three) times daily. ) 180 capsule 1  . melatonin 10 MG TABS Take 10 mg by mouth at bedtime. (Patient not taking: Reported on 10/07/2019) 60 tablet 1  . naltrexone (DEPADE) 50 MG tablet Take 1 tablet (50 mg total) by mouth daily. 1/2 tab for 5-7 days with meals then one whole tab with meals daily (Patient not taking: Reported on 10/07/2019) 30 tablet 1  . naltrexone (DEPADE) 50 MG tablet Take 0.5 tablets (25 mg total) by mouth daily with breakfast. (Patient not taking: Reported on 10/07/2019) 5 tablet 0  . naltrexone (DEPADE) 50 MG tablet Take 1 tablet (50 mg total) by mouth daily with breakfast. (Patient not taking: Reported on 10/07/2019) 30 tablet 1  . nortriptyline (PAMELOR) 25 MG capsule Take 1 capsule (25 mg total) by mouth at bedtime. (Patient not taking: Reported on 10/07/2019) 30  capsule 1  . OLANZapine zydis (ZYPREXA) 15 MG disintegrating tablet Take 1 tablet (15 mg total) by mouth at bedtime. (Patient not taking: Reported on 10/07/2019) 90 tablet 1  . thiamine 100 MG tablet Take 1 tablet (100 mg total) by mouth daily. (Patient not taking: Reported on 10/07/2019) 30 tablet 1    Musculoskeletal: Strength & Muscle Tone: within normal limits Gait & Station: normal Patient leans: N/A  Psychiatric Specialty Exam: Physical Exam Vitals and nursing note reviewed.  HENT:     Head: Normocephalic.     Nose: Nose normal.  Eyes:     Pupils: Pupils are equal, round, and reactive to light.  Pulmonary:     Effort: Pulmonary effort is normal.  Musculoskeletal:        General: Normal range of motion.     Cervical back: Normal range of motion.  Skin:    General: Skin is warm and dry.  Neurological:     General: No focal deficit present.     Mental Status: He is alert and oriented to person, place, and time. Mental status is at baseline.  Psychiatric:        Attention and Perception: Attention and perception normal.        Mood and Affect: Mood is anxious. Affect is labile and angry.        Speech: Speech normal.        Behavior: Behavior is agitated. Behavior is cooperative.        Thought Content: Thought content normal.        Cognition and Memory: Cognition normal.        Judgment: Judgment normal.     Review of Systems  Psychiatric/Behavioral: Positive for agitation, behavioral problems and dysphoric mood. Negative for self-injury.  All other systems reviewed and are negative.   Blood pressure 103/82, pulse 84, temperature 97.7 F (36.5 C), temperature source Oral, resp. rate 18, height  (1.778 m), weight 82.1 kg, SpO2 96 %.Body mass index is 25.97 kg/m.  General Appearance: Casual  Eye Contact:  Fair  Speech:  Normal Rate  Volume:  Normal  Mood:  Dysphoric  Affect:  Congruent  Thought Process:  Coherent and Descriptions of Associations: Intact   Orientation:  Full (Time, Place, and Person)  Thought Content:  WDL  Suicidal Thoughts:  statements made for secondry gain  Homicidal Thoughts:  No  Memory:  Immediate;   Good  Judgement:  Intact  Insight:  Fair  Psychomotor Activity:  Normal  Concentration:  Attention Span: Fair  Recall:  [employer] of Knowledge:  Fair  Language:  Fair  Akathisia:  NA  Handed:  Right  AIMS (if indicated):     Assets:  Communication Skills Resilience  ADL's:  Intact  Cognition:  WNL  Sleep:        Disposition: No evidence of imminent risk to self or others at present.   Patient does not meet criteria for psychiatric inpatient admission. Discussed crisis plan, support from social network, calling 911, coming to the Emergency Department, and calling Suicide Hotline.  Jearld Lesch, NP 10/08/2019 12:13 AM

## 2019-10-08 NOTE — BH Assessment (Signed)
Assessment Note  John [name] is an 39 y.o. male. Triage Note: Pt arrived via ems from a business, pt states that he is intoxicated that he has an alcohol problem, and states that he is also suicidal, thinking about walking out into traffic, knife given to security   Patient was sleeping upon this writers arrival.  Patient was initially calm and cooperative with the psych team. The patient reported that he became suicidal after drinking in excess and expressed that he needs to stop that. The patient expressed that he needs help with detox and rehab. Denies any hallucinations, or homicidal ideations. When asked why he didnt utilize the resources provided a day ago, patient stated, The shelters were full and I lacked transportation to get to the other places on the list. The patient became increasingly labile when confronted by his failure to follow up with resources on several occasions. Patient became irate and stated Well fuck it Ill go kill my Goddamn self. Patient terminated the interview by shutting down.    Diagnosis: 291.89  Alcohol-induced bipolar and related disorder  Past Medical History:  Past Medical History:  Diagnosis Date   ASD (atrial septal defect)    Bacterial infection due to H. pylori    Hypertension    Kidney stones     Past Surgical History:  Procedure Laterality Date   ASD REPAIR     FOOT SURGERY Right    KNEE SURGERY Right     Family History: No family history on file.  Social History:  reports that he has been smoking cigarettes. He has never used smokeless tobacco. He reports current alcohol use. He reports previous drug use.  Additional Social History:  Alcohol / Drug Use Pain Medications: See PTA Prescriptions: See PTA History of alcohol / drug use?: Yes Negative Consequences of Use: Personal relationships, Financial Withdrawal Symptoms: Agitation Substance #1 Name of Substance 1: Alcohol Substance #2 Name of Substance 2:  Cocaine  CIWA: CIWA-Ar BP: 103/82 Pulse Rate: 84 Nausea and Vomiting: no nausea and no vomiting Tactile Disturbances: none Tremor: no tremor Auditory Disturbances: not present Paroxysmal Sweats: no sweat visible Visual Disturbances: not present Anxiety: no anxiety, at ease Headache, Fullness in Head: none present Agitation: normal activity Orientation and Clouding of Sensorium: oriented and can do serial additions CIWA-Ar Total: 0 COWS:    Allergies:  Allergies  Allergen Reactions   Penicillins     Did it involve swelling of the face/tongue/throat, SOB, or low BP? No Did it involve sudden or severe rash/hives, skin peeling, or any reaction on the inside of your mouth or nose? No Did you need to seek medical attention at a hospital or doctor's office? Unknown When did it last happen? If all above answers are NO, may proceed with cephalosporin use.    Home Medications: (Not in a hospital admission)   OB/GYN Status:  No LMP for male patient.  General Assessment Data Location of Assessment: [facility] TTS Assessment: In system Is this a Tele or Face-to-Face Assessment?: Face-to-Face Is this an Initial Assessment or a Re-assessment for this encounter?: Initial Assessment Patient Accompanied by:: N/A Language Other than English: No Living Arrangements: Homeless/Shelter What gender do you identify as?: Male Date Telepsych consult ordered in [facility]: 10/07/19 Time Telepsych consult ordered in [facility]: 1512 Marital status: Single Maiden name: n/a Pregnancy Status: No Living Arrangements: Other (Comment) Can pt return to current living arrangement?: Yes Admission Status: Voluntary Is patient capable of signing voluntary admission?: Yes Referral Source: Self/Family/Friend  Insurance type: None  Medical Screening Exam [facility] Walk-in ONLY) Medical Exam completed: Yes  Crisis Care Plan Living Arrangements: Other (Comment) Legal Guardian: Other: (Self) Name of Psychiatrist:  None reported  Name of Therapist: None reported   Education Status Is patient currently in school?: No Highest grade of school patient has completed: 10th and recieved GED Is the patient employed, unemployed or receiving disability?: Unemployed  Risk to self with the past 6 months Suicidal Ideation: No Has patient been a risk to self within the past 6 months prior to admission? : Yes Suicidal Intent: No Has patient had any suicidal intent within the past 6 months prior to admission? : Yes Is patient at risk for suicide?: No Suicidal Plan?: No Has patient had any suicidal plan within the past 6 months prior to admission? : No Specify Current Suicidal Plan: n/a Access to Means: No What has been your use of drugs/alcohol within the last 12 months?: Alchohol Previous Attempts/Gestures: No How many times?: 0 Other Self Harm Risks: n/a Triggers for Past Attempts: None known Intentional Self Injurious Behavior: None Family Suicide History: Unknown Recent stressful life event(s): Conflict (Comment) Persecutory voices/beliefs?: No Depression: Yes Depression Symptoms: Feeling angry/irritable, Feeling worthless/self pity Substance abuse history and/or treatment for substance abuse?: Yes Suicide prevention information given to non-admitted patients: Not applicable  Risk to Others within the past 6 months Homicidal Ideation: No Does patient have any lifetime risk of violence toward others beyond the six months prior to admission? : No Thoughts of Harm to Others: No Current Homicidal Intent: No Current Homicidal Plan: No Access to Homicidal Means: No Identified Victim: n/a History of harm to others?: No Assessment of Violence: None Noted Violent Behavior Description: n/a Does patient have access to weapons?: No Criminal Charges Pending?: No Describe Pending Criminal Charges: n/a Does patient have a court date: No Court Date:  (n/a) Is patient on probation?:  No  Psychosis Hallucinations: None noted Delusions: None noted  Mental Status Report Appearance/Hygiene: Disheveled, In scrubs Eye Contact: Fair Motor Activity: Freedom of movement Speech: Argumentative Level of Consciousness: Drowsy Mood: Labile, Worthless, low self-esteem, Threatening Affect: Labile Anxiety Level: Minimal Thought Processes: Relevant, Coherent Judgement: Impaired Orientation: Place, Person, Time, Situation Obsessive Compulsive Thoughts/Behaviors: None  Cognitive Functioning Concentration: Normal Memory: Recent Intact, Remote Intact Is patient IDD: No Insight: Poor Impulse Control: Poor Appetite: Fair Have you had any weight changes? : No Change Sleep: Unable to Assess Vegetative Symptoms: None  ADLScreening [facility] Services) Patient's cognitive ability adequate to safely complete daily activities?: Yes Patient able to express need for assistance with ADLs?: Yes Independently performs ADLs?: Yes (appropriate for developmental age)  Prior Inpatient Therapy Prior Inpatient Therapy: Yes Prior Therapy Dates: 09/01/19 Prior Therapy Facilty/Provider(s): [facility] Reason for Treatment: Bipolar I; ETOH abuse  Prior Outpatient Therapy Prior Outpatient Therapy: No Does patient have an ACCT team?: No Does patient have Intensive In-House Services?  : No Does patient have [facility] services? : No Does patient have P4CC services?: No  ADL Screening (condition at time of admission) Patient's cognitive ability adequate to safely complete daily activities?: Yes Is the patient deaf or have difficulty hearing?: No Does the patient have difficulty seeing, even when wearing glasses/contacts?: No Does the patient have difficulty concentrating, remembering, or making decisions?: No Patient able to express need for assistance with ADLs?: Yes Does the patient have difficulty dressing or bathing?: No Independently performs ADLs?: Yes (appropriate for developmental  age) Communication: Independent Dressing (OT): Independent Is this a  change from baseline?: Pre-admission baseline Grooming: Independent Feeding: Independent Bathing: Independent Toileting: Independent In/Out Bed: Independent Walks in Home: Independent Does the patient have difficulty walking or climbing stairs?: No Weakness of Legs: None Weakness of Arms/Hands: None  Home Assistive Devices/Equipment Home Assistive Devices/Equipment: None  Therapy Consults (therapy consults require a physician order) PT Evaluation Needed: No OT Evalulation Needed: No SLP Evaluation Needed: No Abuse/Neglect Assessment (Assessment to be complete while patient is alone) Physical Abuse: Denies Verbal Abuse: Denies Sexual Abuse: Denies Exploitation of patient/patient's resources: Denies Self-Neglect: Denies Values / Beliefs Cultural Requests During Hospitalization: None Spiritual Requests During Hospitalization: None Consults Spiritual Care Consult Needed: No Transition of Care Team Consult Needed: No Advance Directives (For Healthcare) Does Patient Have a Medical Advance Directive?: No Would patient like information on creating a medical advance directive?: No - Patient declined          Disposition: Per Rashaun, NP patient is malingering and does not meet inpatient criteria. Patient can be discharged.  Disposition Initial Assessment Completed for this Encounter: Yes  On Site Evaluation by:   Reviewed with Physician:    Foy Guadalajara 10/08/2019 2:29 AM

## 2019-10-08 NOTE — ED Triage Notes (Addendum)
Pt to ED via [facility] for alcohol intoxication. Pt was here last night and discharged this morning. Pt states that he has been drinking again today. Pt is in NAD.   Pt has a laceration on his left wrist. Pt state states that he cut his wrist in an attempt to harm himself.

## 2019-10-08 NOTE — ED Notes (Signed)
Report to include Situation, Background, Assessment, and Recommendations received from Gwenlyn Found RN. Patient alert and oriented, warm and dry, in no acute distress. Patient made aware of Q15 minute rounds and Psychologist, counselling presence for their safety. Patient instructed to come to me with needs or concerns.

## 2019-10-08 NOTE — ED Notes (Addendum)
Hourly rounding reveals patient awake in hall bed. No complaints, stable, in no acute distress. Q15 minute rounds and monitoring via Rover and Officer to continue.  

## 2019-10-08 NOTE — Discharge Instructions (Addendum)
Please seek medical attention for any high fevers, chest pain, shortness of breath, change in behavior, persistent vomiting, bloody stool or any other new or concerning symptoms.  

## 2019-10-08 NOTE — ED Notes (Signed)
Pt given phone to attempt to obtain ride home.  Pt asking about inpatient detox, informed he has been released by psych at this time.  Pt to attempt to find a ride.  Will discharge to lobby if unable to obtain ride.

## 2019-10-08 NOTE — ED Provider Notes (Signed)
[facility] Emergency Department Provider Note    ____________________________________________   I have reviewed the triage vital signs and the nursing notes.   HISTORY  Chief Complaint Alcohol Intoxication   History limited by: Not Limited   HPI John [name] is a 39 y.o. male who presents to the emergency department today because of concern for alcohol abuse. The patient states that he would like to go to a detox facility. The patient states that he did cut his left wrist when he was more intoxicated. Was discharged from the ER earlier today.  Records reviewed. Per medical record review patient has a history of recent frequent ER visits for alcohol abuse.   Past Medical History:  Diagnosis Date   ASD (atrial septal defect)    Bacterial infection due to H. pylori    Hypertension    Kidney stones     Patient Active Problem List   Diagnosis Date Noted   Suicidal ideation    Malingerer 10/07/2019   Bipolar 1 disorder (HCC) 09/09/2019   Chronic back pain 08/10/2019   Encounter to establish care 07/12/2019   History of depression 07/12/2019   Smoking 07/12/2019   Depressive disorder 06/14/2019   Sepsis (HCC) 04/30/2019   Fall 04/30/2019   Left leg pain 04/30/2019   Orthopedic hardware present 04/30/2019   S/P cystoscopy 04/20/19 [facility]. Hx urethroplasty for urethral trauma in 2020 04/30/2019   Alcohol abuse with intoxication (HCC) 04/30/2019   Acute metabolic encephalopathy 04/30/2019   Hypoxia 04/30/2019   Acute Femoral condyle fracture (HCC) 04/30/2019   Severe sepsis (HCC) 04/30/2019   Alcohol abuse 01/14/2018   Substance induced mood disorder (HCC) 01/14/2018    Past Surgical History:  Procedure Laterality Date   ASD REPAIR     FOOT SURGERY Right    KNEE SURGERY Right     Prior to Admission medications   Medication Sig Start Date End Date Taking? Authorizing Provider  divalproex (DEPAKOTE) 500 MG DR tablet  Take 2 tablets (1,000 mg total) by mouth 2 (two) times daily. Patient taking differently: Take 1,000 mg by mouth at bedtime.  09/17/19   Roselind Messier, MD  gabapentin (NEURONTIN) 300 MG capsule Take 2 capsules (600 mg total) by mouth 3 (three) times daily. Patient taking differently: Take 300 mg by mouth 3 (three) times daily.  09/17/19   Roselind Messier, MD  melatonin 10 MG TABS Take 10 mg by mouth at bedtime. Patient not taking: Reported on 10/07/2019 09/17/19   Roselind Messier, MD  naltrexone (DEPADE) 50 MG tablet Take 1 tablet (50 mg total) by mouth daily. 1/2 tab for 5-7 days with meals then one whole tab with meals daily Patient not taking: Reported on 10/07/2019 09/17/19   Roselind Messier, MD  naltrexone (DEPADE) 50 MG tablet Take 0.5 tablets (25 mg total) by mouth daily with breakfast. Patient not taking: Reported on 10/07/2019 09/17/19   Roselind Messier, MD  naltrexone (DEPADE) 50 MG tablet Take 1 tablet (50 mg total) by mouth daily with breakfast. Patient not taking: Reported on 10/07/2019 09/22/19   Roselind Messier, MD  nortriptyline (PAMELOR) 25 MG capsule Take 1 capsule (25 mg total) by mouth at bedtime. Patient not taking: Reported on 10/07/2019 09/17/19   Roselind Messier, MD  OLANZapine zydis (ZYPREXA) 15 MG disintegrating tablet Take 1 tablet (15 mg total) by mouth at bedtime. Patient not taking: Reported on 10/07/2019 09/17/19   Roselind Messier, MD  thiamine 100 MG tablet Take 1 tablet (100 mg total) by  mouth daily. Patient not taking: Reported on 10/07/2019 09/18/19   Roselind Messier, MD    Allergies Penicillins  No family history on file.  Social History Social History   Tobacco Use   Smoking status: Current Every Day Smoker    Types: Cigarettes   Smokeless tobacco: Never Used   Tobacco comment: down to 2 cigarettes a day  Vaping Use   Vaping Use: Never used  Substance Use Topics   Alcohol use: Yes    Comment: Last consumed Friday 06/10/19, pt states he is "an  alcoholic" and "wants to drink"   Drug use: Not Currently    Review of Systems Constitutional: No fever/chills Eyes: No visual changes. ENT: No sore throat. Cardiovascular: Denies chest pain. Respiratory: Denies shortness of breath. Gastrointestinal: No abdominal pain.  No nausea, no vomiting.  No diarrhea.   Genitourinary: Negative for dysuria. Musculoskeletal: Negative for back pain. Skin: Positive for superficial laceration to left wrist. Neurological: Negative for headaches, focal weakness or numbness.  ____________________________________________   PHYSICAL EXAM:  VITAL SIGNS: ED Triage Vitals  Enc Vitals Group     BP 10/08/19 1809 115/77     Pulse Rate 10/08/19 1755 (!) 106     Resp 10/08/19 1755 16     Temp 10/08/19 1809 98 F (36.7 C)     Temp src --      SpO2 10/08/19 1755 95 %     Weight --      Height --      Head Circumference --      Peak Flow --      Pain Score 10/08/19 1755 9   Constitutional: Alert and oriented.  Eyes: Conjunctivae are normal.  ENT      Head: Normocephalic and atraumatic.      Nose: No congestion/rhinnorhea.      Mouth/Throat: Mucous membranes are moist.      Neck: No stridor. Hematological/Lymphatic/Immunilogical: No cervical lymphadenopathy. Cardiovascular: Normal rate, regular rhythm.  No murmurs, rubs, or gallops.  Respiratory: Normal respiratory effort without tachypnea nor retractions. Breath sounds are clear and equal bilaterally. No wheezes/rales/rhonchi. Gastrointestinal: Soft and non tender. No rebound. No guarding.  Genitourinary: Deferred Musculoskeletal: Normal range of motion in all extremities. No lower extremity edema. Neurologic:  Normal speech and language. No gross focal neurologic deficits are appreciated.  Skin:  Superficial laceration to left wrist.  Psychiatric: Mood and affect are normal. Speech and behavior are normal. Patient exhibits appropriate insight and  judgment.  ____________________________________________    LABS (pertinent positives/negatives)  None  ____________________________________________   EKG  None  ____________________________________________    RADIOLOGY  None  ____________________________________________   PROCEDURES  Procedures  ____________________________________________   INITIAL IMPRESSION / ASSESSMENT AND PLAN / ED COURSE  Pertinent labs & imaging results that were available during my care of the patient were reviewed by me and considered in my medical decision making (see chart for details).   Patient presented to the emergency department today stating that he drink alcohol and that he cut himself.  On exam patient is awake and alert.  Does not appear significantly intoxicated.  He does not appear depressed.  Per chart review he has been to the emergency department recently numerous times.  At this point do not feel patient is truly a danger to himself.  Has recently been evaluated by psychology who did not deem him a danger to himself.  Patient was observed in the emergency department for a couple of hours and and further sobriety.  [facility] was contacted and unfortunately did not have any beds for detox.  Will give patient [facility] and [facility] information.  ____________________________________________   FINAL CLINICAL IMPRESSION(S) / ED DIAGNOSES  Final diagnoses:  Alcoholic intoxication without complication (HCC)     Note: This dictation was prepared with Dragon dictation. Any transcriptional errors that result from this process are unintentional     Phineas Semen, MD 10/08/19 2015

## 2019-10-08 NOTE — ED Notes (Signed)
breakfast given.

## 2019-10-08 NOTE — ED Notes (Signed)
Pt has been drinking. Pt is inappropriate with women and therefore Les, EDT is dressing pt out. Pt cooperative. Lac on FA is not bleeding at this time. Lac goes across wrist.

## 2019-10-11 ENCOUNTER — Telehealth: Payer: Self-pay | Admitting: General Practice

## 2019-10-11 NOTE — Telephone Encounter (Addendum)
Patient is no longer at the rehab facility as he has relapsed... person I spoke to said that he's been bouncing around hospitals and living on the streets.... he also said he'd contact us if Copper came back to make him an appt  ----- Message from Rolm Gala, NP sent at 10/11/2019  8:46 AM EDT ----- Pls call and schedule an in clinic F/U appointment, and make a telephone note. Thank you

## 2019-10-25 ENCOUNTER — Emergency Department: Payer: Self-pay

## 2019-10-25 ENCOUNTER — Other Ambulatory Visit: Payer: Self-pay

## 2019-10-25 ENCOUNTER — Emergency Department
Admission: EM | Admit: 2019-10-25 | Discharge: 2019-10-27 | Disposition: A | Discharge status: Home / Self Care | Payer: Self-pay | Attending: Emergency Medicine | Admitting: Emergency Medicine

## 2019-10-25 DIAGNOSIS — F1994 Other psychoactive substance use, unspecified with psychoactive substance-induced mood disorder: Secondary | ICD-10-CM | POA: Insufficient documentation

## 2019-10-25 DIAGNOSIS — Z79899 Other long term (current) drug therapy: Secondary | ICD-10-CM | POA: Insufficient documentation

## 2019-10-25 DIAGNOSIS — F1010 Alcohol abuse, uncomplicated: Secondary | ICD-10-CM

## 2019-10-25 DIAGNOSIS — R0789 Other chest pain: Secondary | ICD-10-CM | POA: Insufficient documentation

## 2019-10-25 DIAGNOSIS — R45851 Suicidal ideations: Secondary | ICD-10-CM

## 2019-10-25 DIAGNOSIS — F1020 Alcohol dependence, uncomplicated: Secondary | ICD-10-CM | POA: Insufficient documentation

## 2019-10-25 DIAGNOSIS — Z720 Tobacco use: Secondary | ICD-10-CM

## 2019-10-25 DIAGNOSIS — R079 Chest pain, unspecified: Secondary | ICD-10-CM

## 2019-10-25 DIAGNOSIS — I10 Essential (primary) hypertension: Secondary | ICD-10-CM | POA: Insufficient documentation

## 2019-10-25 DIAGNOSIS — F17210 Nicotine dependence, cigarettes, uncomplicated: Secondary | ICD-10-CM | POA: Insufficient documentation

## 2019-10-25 DIAGNOSIS — F319 Bipolar disorder, unspecified: Secondary | ICD-10-CM

## 2019-10-25 DIAGNOSIS — Z20822 Contact with and (suspected) exposure to covid-19: Secondary | ICD-10-CM | POA: Insufficient documentation

## 2019-10-25 LAB — CBC WITH DIFFERENTIAL/PLATELET
Abs Immature Granulocytes: 0.04 10*3/uL (ref 0.00–0.07)
Basophils Absolute: 0 10*3/uL (ref 0.0–0.1)
Basophils Relative: 0 %
Eosinophils Absolute: 0 10*3/uL (ref 0.0–0.5)
Eosinophils Relative: 0 %
HCT: 44.8 % (ref 39.0–52.0)
Hemoglobin: 15.5 g/dL (ref 13.0–17.0)
Immature Granulocytes: 0 %
Lymphocytes Relative: 24 %
Lymphs Abs: 2.3 10*3/uL (ref 0.7–4.0)
MCH: 31.2 pg (ref 26.0–34.0)
MCHC: 34.6 g/dL (ref 30.0–36.0)
MCV: 90.1 fL (ref 80.0–100.0)
Monocytes Absolute: 0.7 10*3/uL (ref 0.1–1.0)
Monocytes Relative: 8 %
Neutro Abs: 6.4 10*3/uL (ref 1.7–7.7)
Neutrophils Relative %: 68 %
Platelets: 277 10*3/uL (ref 150–400)
RBC: 4.97 MIL/uL (ref 4.22–5.81)
RDW: 14.1 % (ref 11.5–15.5)
WBC: 9.5 10*3/uL (ref 4.0–10.5)
nRBC: 0 % (ref 0.0–0.2)

## 2019-10-25 LAB — COMPREHENSIVE METABOLIC PANEL
ALT: 14 U/L (ref 0–44)
AST: 20 U/L (ref 15–41)
Albumin: 4.2 g/dL (ref 3.5–5.0)
Alkaline Phosphatase: 110 U/L (ref 38–126)
Anion gap: 13 (ref 5–15)
BUN: 8 mg/dL (ref 6–20)
CO2: 21 mmol/L — ABNORMAL LOW (ref 22–32)
Calcium: 8.5 mg/dL — ABNORMAL LOW (ref 8.9–10.3)
Chloride: 105 mmol/L (ref 98–111)
Creatinine, Ser: 0.69 mg/dL (ref 0.61–1.24)
GFR calc Af Amer: >60 mL/min (ref >60)
GFR calc non Af Amer: >60 mL/min (ref >60)
Glucose, Bld: 89 mg/dL (ref 70–99)
Potassium: 3 mmol/L — ABNORMAL LOW (ref 3.5–5.1)
Sodium: 139 mmol/L (ref 135–145)
Total Bilirubin: 1 mg/dL (ref 0.3–1.2)
Total Protein: 8.4 g/dL — ABNORMAL HIGH (ref 6.5–8.1)

## 2019-10-25 LAB — ETHANOL: Alcohol, Ethyl (B): 202 mg/dL — ABNORMAL HIGH (ref <10)

## 2019-10-25 LAB — TROPONIN I (HIGH SENSITIVITY): Troponin I (High Sensitivity): 3 ng/L (ref <18)

## 2019-10-25 MED ORDER — ALUM & MAG HYDROXIDE-SIMETH 200-200-20 MG/5ML PO SUSP
30.0000 mL | Freq: Once | ORAL | Status: AC
  Administered 2019-10-25: 30 mL via ORAL
  Filled 2019-10-25: qty 30

## 2019-10-25 NOTE — ED Triage Notes (Signed)
Pt in with with co chest pain since 1100 this am states worse when coughs and takes a deep breath, also co suicidal ideation x 2 months. Pt has also been drinking all day.

## 2019-10-25 NOTE — ED Notes (Signed)
Pt returned from xray

## 2019-10-25 NOTE — ED Notes (Signed)
Pt to xray in wheelchair with xray tech.

## 2019-10-25 NOTE — ED Provider Notes (Signed)
[facility] Emergency Department Provider Note  ____________________________________________   First MD Initiated Contact with Patient 10/25/19 2231     (approximate)  I have reviewed the triage vital signs and the nursing notes.   HISTORY  Chief Complaint Chest Pain and Suicidal   HPI John [name] is a 39 y.o. male with a past medical history of HTN, bipolar disorder, depression, polysubstance abuse including daily EtOH use, multiple visits for SI, and tobacco abuse who presents for assessment of 2 chief complaints.  First patient states he has developed some chest pain around 11 AM today.  It is substernal and pressure-like.  It is nonradiating.  No prior similar episodes.  No clear alleviating or aggravating factors.  Patient endorses cough over the last several days and chronic diarrhea but no headache, earache, sore throat, fevers, vomiting, shortness of breath, abdominal pain, extremity pain, rash, or other recent injuries or falls.  States he had 2 shots of liquor today does smoke cigarettes daily.  States he drinks every day.  Did not attempt to harm himself today.  No HI or hallucinations.         Past Medical History:  Diagnosis Date  . ASD (atrial septal defect)   . Bacterial infection due to H. pylori   . Hypertension   . Kidney stones     Patient Active Problem List   Diagnosis Date Noted  . Suicidal ideation   . Malingerer 10/07/2019  . Bipolar 1 disorder (HCC) 09/09/2019  . Chronic back pain 08/10/2019  . Encounter to establish care 07/12/2019  . History of depression 07/12/2019  . Smoking 07/12/2019  . Depressive disorder 06/14/2019  . Sepsis (HCC) 04/30/2019  . Fall 04/30/2019  . Left leg pain 04/30/2019  . Orthopedic hardware present 04/30/2019  . S/P cystoscopy 04/20/19 [facility]. Hx urethroplasty for urethral trauma in 2020 04/30/2019  . Alcohol abuse with intoxication (HCC) 04/30/2019  . Acute metabolic encephalopathy 04/30/2019   . Hypoxia 04/30/2019  . Acute Femoral condyle fracture (HCC) 04/30/2019  . Severe sepsis (HCC) 04/30/2019  . Alcohol abuse 01/14/2018  . Substance induced mood disorder (HCC) 01/14/2018    Past Surgical History:  Procedure Laterality Date  . ASD REPAIR    . FOOT SURGERY Right   . KNEE SURGERY Right     Prior to Admission medications   Medication Sig Start Date End Date Taking? Authorizing Provider  divalproex (DEPAKOTE) 500 MG DR tablet Take 2 tablets (1,000 mg total) by mouth 2 (two) times daily. Patient not taking: Reported on 10/08/2019 09/17/19   Roselind Messier, MD  gabapentin (NEURONTIN) 300 MG capsule Take 2 capsules (600 mg total) by mouth 3 (three) times daily. Patient not taking: Reported on 10/08/2019 09/17/19   Roselind Messier, MD  melatonin 10 MG TABS Take 10 mg by mouth at bedtime. Patient not taking: Reported on 10/07/2019 09/17/19   Roselind Messier, MD  naltrexone (DEPADE) 50 MG tablet Take 1 tablet (50 mg total) by mouth daily. 1/2 tab for 5-7 days with meals then one whole tab with meals daily Patient not taking: Reported on 10/07/2019 09/17/19   Roselind Messier, MD  naltrexone (DEPADE) 50 MG tablet Take 0.5 tablets (25 mg total) by mouth daily with breakfast. Patient not taking: Reported on 10/07/2019 09/17/19   Roselind Messier, MD  naltrexone (DEPADE) 50 MG tablet Take 1 tablet (50 mg total) by mouth daily with breakfast. Patient not taking: Reported on 10/07/2019 09/22/19   Roselind Messier, MD  nortriptyline (  PAMELOR) 25 MG capsule Take 1 capsule (25 mg total) by mouth at bedtime. Patient not taking: Reported on 10/07/2019 09/17/19   Roselind Messier, MD  OLANZapine zydis (ZYPREXA) 15 MG disintegrating tablet Take 1 tablet (15 mg total) by mouth at bedtime. Patient not taking: Reported on 10/07/2019 09/17/19   Roselind Messier, MD  thiamine 100 MG tablet Take 1 tablet (100 mg total) by mouth daily. Patient not taking: Reported on 10/07/2019 09/18/19   Roselind Messier, MD     Allergies Penicillins  No family history on file.  Social History Social History   Tobacco Use  . Smoking status: Current Every Day Smoker    Types: Cigarettes  . Smokeless tobacco: Never Used  . Tobacco comment: down to 2 cigarettes a day  Vaping Use  . Vaping Use: Never used  Substance Use Topics  . 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 Drug use: Not Currently    Review of Systems  Review of Systems  Constitutional: Negative for chills and fever.  HENT: Negative for sore throat.   Eyes: Negative for pain.  Respiratory: Negative for cough and stridor.   Cardiovascular: Positive for chest pain.  Gastrointestinal: Positive for diarrhea. Negative for vomiting.  Skin: Negative for rash.  Neurological: Negative for seizures, loss of consciousness and headaches.  Psychiatric/Behavioral: Positive for depression, substance abuse and suicidal ideas.  All other systems reviewed and are negative.     ____________________________________________   PHYSICAL EXAM:  VITAL SIGNS: ED Triage Vitals  Enc Vitals Group     BP      Pulse      Resp      Temp      Temp src      SpO2      Weight      Height      Head Circumference      Peak Flow      Pain Score      Pain Loc      Pain Edu?      Excl. in GC?    Vitals:   10/25/19 2302  BP: 109/83  Pulse: 85  Resp: 17  Temp: 98.9 F (37.2 C)  SpO2: 96%   Physical Exam Vitals and nursing note reviewed.  Constitutional:      Appearance: He is well-developed.  HENT:     Head: Normocephalic and atraumatic.  Eyes:     Conjunctiva/sclera: Conjunctivae normal.  Cardiovascular:     Rate and Rhythm: Normal rate and regular rhythm.     Heart sounds: No murmur heard.   Pulmonary:     Effort: Pulmonary effort is normal. No respiratory distress.     Breath sounds: Normal breath sounds.  Abdominal:     Palpations: Abdomen is soft.      Tenderness: There is no abdominal tenderness.  Musculoskeletal:     Cervical back: Neck supple.  Skin:    General: Skin is warm and dry.  Neurological:     Mental Status: He is alert.  Psychiatric:        Speech: Speech is slurred.        Thought Content: Thought content includes suicidal ideation.      ____________________________________________   LABS (all labs ordered are listed, but only abnormal results are displayed)  Labs Reviewed  SARS CORONAVIRUS 2 BY RT PCR (HOSPITAL ORDER, PERFORMED IN [facility])  CBC  WITH DIFFERENTIAL/PLATELET  COMPREHENSIVE METABOLIC PANEL  ETHANOL  SALICYLATE LEVEL  ACETAMINOPHEN LEVEL  URINE DRUG SCREEN, QUALITATIVE ([facility] ONLY)  TROPONIN I (HIGH SENSITIVITY)   ____________________________________________  EKG  Sinus rhythm with a ventricular rate of 84, normal axis, unremarkable intervals, nonspecific changes in V3 which is somewhat of a poor tracing as well as nonspecific ST changes in lead II with no other clear evidence of acute ischemia or other significant underlying arrhythmia. ____________________________________________  RADIOLOGY  ED MD interpretation: No evidence of pneumonia, thorax, edema, or other acute intrathoracic process.  Official radiology report(s): DG Chest 2 View  Result Date: 10/25/2019 CLINICAL DATA:  Chest pain, worse with inspiration, cough EXAM: CHEST - 2 VIEW COMPARISON:  10/07/2019 FINDINGS: Frontal and lateral views of the chest demonstrate a stable cardiac silhouette. Persistent bibasilar scarring or atelectasis. Chronic elevation of the left hemidiaphragm. No airspace disease, effusion, or pneumothorax. Stable postsurgical changes at the thoracolumbar junction. No acute bony abnormalities. IMPRESSION: 1. Stable exam, no acute process. Electronically Signed   By: Sharlet SalinaMichael  Brown M.D.   On: 10/25/2019 22:57    ____________________________________________   PROCEDURES  Procedure(s) performed  (including Critical Care):  Procedures   ____________________________________________   INITIAL IMPRESSION / ASSESSMENT AND PLAN / ED COURSE        Patient presents with left to history exam for assessment of 2 chief complaints.  First he complains of some chest pain.  In addition he is complaining of SI in the setting of EtOH use.  Patient is afebrile hemodynamically stable on arrival.  With regard to his complaint of chest pain.  EKG is reassuring we will plan to obtain a delta troponin.  Low suspicion for PE as patient has no complaints of shortness of breath and has not tachypneic, hypoxic, or otherwise showing evidence of any respiratory distress.  Chest x-ray does not show evidence of acute pneumonia, pneumothorax, rib fracture, widening this time, edema, or other acute thoracic process.  Presentation is not consistent with dissection.  We will plan to obtain routine labs to assess for evidence of significant anemia or significant metabolic derangement.  Patient given Maalox for possible reflux in the setting of significant alcohol abuse.  With regard to patient's complaint of suicidal ideation without a plan.  On review of records this does appear to be of very chronic complaint for this patient and discuss further discussion with him has not significantly different today than it has been over the last several weeks.  Patient states he has not been able to get into an inpatient bed since he was last seen in the ED 2 weeks ago for his alcohol detox.  It does seem that if he was able to get help with his detox this would significantly improve his depression.  Consult placed for social worker to assess for detox resources.  Care of patient assumed by oncoming provider at approximately 2200.  Plan is to follow-up labs including delta troponin and reassess patient as he is somewhat intoxicated on initial exam.  In addition patient will likely require resources for alcohol abuse and outpatient  resources for his chronic depression and suicidality.  I do not believe he is in imminent danger to himself at this time and does not require IVC at this time.   ____________________________________________   FINAL CLINICAL IMPRESSION(S) / ED DIAGNOSES  Final diagnoses:  Suicidal ideation  Chest pain, unspecified type  ETOH abuse  Tobacco abuse    Medications  alum & mag hydroxide-simeth (MAALOX/MYLANTA) 200-200-20  MG/5ML suspension 30 mL (has no administration in time range)     ED Discharge Orders    None       Note:  This document was prepared using Dragon voice recognition software and may include unintentional dictation errors.   Gilles Chiquito, MD 10/25/19 [phone]

## 2019-10-26 LAB — URINE DRUG SCREEN, QUALITATIVE (ARMC ONLY)
Amphetamines, Ur Screen: NOT DETECTED
Barbiturates, Ur Screen: NOT DETECTED
Benzodiazepine, Ur Scrn: NOT DETECTED
Cannabinoid 50 Ng, Ur ~~LOC~~: NOT DETECTED
Cocaine Metabolite,Ur ~~LOC~~: NOT DETECTED
MDMA (Ecstasy)Ur Screen: NOT DETECTED
Methadone Scn, Ur: NOT DETECTED
Opiate, Ur Screen: NOT DETECTED
Phencyclidine (PCP) Ur S: NOT DETECTED
Tricyclic, Ur Screen: NOT DETECTED

## 2019-10-26 LAB — TROPONIN I (HIGH SENSITIVITY): Troponin I (High Sensitivity): 4 ng/L (ref <18)

## 2019-10-26 LAB — ACETAMINOPHEN LEVEL: Acetaminophen (Tylenol), Serum: <10 ug/mL — ABNORMAL LOW (ref 10–30)

## 2019-10-26 LAB — SARS CORONAVIRUS 2 BY RT PCR (HOSPITAL ORDER, PERFORMED IN ~~LOC~~ HOSPITAL LAB): SARS Coronavirus 2: NEGATIVE

## 2019-10-26 LAB — SALICYLATE LEVEL: Salicylate Lvl: <7 mg/dL — ABNORMAL LOW (ref 7.0–30.0)

## 2019-10-26 MED ORDER — GABAPENTIN 300 MG PO CAPS
300.0000 mg | ORAL_CAPSULE | Freq: Three times a day (TID) | ORAL | Status: DC
  Administered 2019-10-26 – 2019-10-27 (×4): 300 mg via ORAL
  Filled 2019-10-26 (×4): qty 1

## 2019-10-26 MED ORDER — LORAZEPAM 1 MG PO TABS: 1.0000 mg | ORAL_TABLET | Freq: Three times a day (TID) | ORAL | Status: DC

## 2019-10-26 MED ORDER — LORAZEPAM 1 MG PO TABS: 1.0000 mg | ORAL_TABLET | Freq: Two times a day (BID) | ORAL | Status: DC

## 2019-10-26 MED ORDER — THIAMINE HCL 100 MG/ML IJ SOLN
100.0000 mg | Freq: Once | INTRAMUSCULAR | Status: AC
  Administered 2019-10-26: 100 mg via INTRAMUSCULAR

## 2019-10-26 MED ORDER — POTASSIUM CHLORIDE CRYS ER 20 MEQ PO TBCR
40.0000 meq | EXTENDED_RELEASE_TABLET | Freq: Once | ORAL | Status: AC
  Administered 2019-10-26: 40 meq via ORAL
  Filled 2019-10-26: qty 2

## 2019-10-26 MED ORDER — LORAZEPAM 1 MG PO TABS
1.0000 mg | ORAL_TABLET | Freq: Four times a day (QID) | ORAL | Status: AC
  Administered 2019-10-26 – 2019-10-27 (×4): 1 mg via ORAL
  Filled 2019-10-26 (×4): qty 1

## 2019-10-26 MED ORDER — LOPERAMIDE HCL 2 MG PO CAPS
2.0000 mg | ORAL_CAPSULE | ORAL | Status: DC | PRN
  Filled 2019-10-26: qty 2

## 2019-10-26 MED ORDER — ADULT MULTIVITAMIN W/MINERALS CH
1.0000 | ORAL_TABLET | Freq: Every day | ORAL | Status: DC
  Administered 2019-10-26 – 2019-10-27 (×2): 1 via ORAL
  Filled 2019-10-26 (×2): qty 1

## 2019-10-26 MED ORDER — HYDROXYZINE HCL 25 MG PO TABS: 25.0000 mg | ORAL_TABLET | Freq: Four times a day (QID) | ORAL | Status: DC | PRN

## 2019-10-26 MED ORDER — LORAZEPAM 1 MG PO TABS: 1.0000 mg | ORAL_TABLET | Freq: Four times a day (QID) | ORAL | Status: DC | PRN

## 2019-10-26 MED ORDER — THIAMINE HCL 100 MG PO TABS
100.0000 mg | ORAL_TABLET | Freq: Every day | ORAL | Status: DC
  Administered 2019-10-27: 100 mg via ORAL
  Filled 2019-10-26: qty 1

## 2019-10-26 MED ORDER — ONDANSETRON 4 MG PO TBDP
4.0000 mg | ORAL_TABLET | Freq: Four times a day (QID) | ORAL | Status: DC | PRN
  Filled 2019-10-26: qty 1

## 2019-10-26 MED ORDER — LORAZEPAM 1 MG PO TABS: 1.0000 mg | ORAL_TABLET | Freq: Every day | ORAL | Status: DC

## 2019-10-26 MED ORDER — HALOPERIDOL 5 MG PO TABS
5.0000 mg | ORAL_TABLET | Freq: Two times a day (BID) | ORAL | Status: DC
  Administered 2019-10-26 – 2019-10-27 (×3): 5 mg via ORAL
  Filled 2019-10-26 (×2): qty 1

## 2019-10-26 NOTE — ED Notes (Signed)
Pt ambulatory to restroom. Pt holding wall to ensure safety. Gait remains unsteady at this time.

## 2019-10-26 NOTE — ED Notes (Signed)
Pt brought into ED BHU via sally port and wand with metal detector for safety by [place] Security officer. Patient oriented to unit/care area: Pt informed of unit policies and procedures.  Informed that, for their safety, care areas are designed for safety and monitored by security cameras at all times. Patient verbalizes understanding, and verbal contract for safety obtained.Pt shown to their room.  

## 2019-10-26 NOTE — ED Notes (Signed)
Assumed care of patient patient from home with c/o of SI, patient sleeping comfortably awaiting re-eval this morning and further plan of care.

## 2019-10-26 NOTE — ED Notes (Signed)
Report to include Situation, Background, Assessment, and Recommendations received from Jeannette RN. Patient alert and oriented, warm and dry, in no acute distress. Patient denies SI, HI, AVH and pain. Patient made aware of Q15 minute rounds and Rover and Officer presence for their safety. Patient instructed to come to me with needs or concerns.   

## 2019-10-26 NOTE — ED Notes (Addendum)
Pt. Was dressed out by this tech and was provided with hospital attire (burgundy scrub top and bottom) belongings were placed in bag with name and label. 2 bags out of 2   Belongings are :   Software engineer of white socks  Black and white boxers  [employer]  Wallet inside jean pocket  Personal green bookbag

## 2019-10-26 NOTE — ED Notes (Signed)
Dinner tray provided

## 2019-10-26 NOTE — ED Notes (Signed)
Patient woke up for lunch ate and went back to sleep.

## 2019-10-26 NOTE — ED Notes (Signed)
Hourly rounding reveals patient in room. No complaints, stable, in no acute distress. Q15 minute rounds and monitoring via Security Cameras to continue. 

## 2019-10-26 NOTE — BH Assessment (Signed)
Assessment Note  John [name] is an 39 y.o. male presenting to [facility] ED voluntarily for chest pain and SI. Per triage note Pt in with with co chest pain since 1100 this am states worse when coughs and takes a deep breath, also co suicidal ideation x 2 months. Pt has also been drinking all day. Patient BAL upon arrival is 50. Patient frequently vists the ED with similar presentation. Patient at one point in 08/2019 and was accepted to [facility], he then returned on 10/05/19 and was Psyc cleared to engage with Social Work with assisting him in finding a homeless shelter. Patient was given numerous resources in which he did no follow up with. During assessment patient appeared alert and oriented x4, visibly intoxicated, and calm and cooperative. When asked why patient was presenting to the ED he reported "chest pain and suicidal" "it's been bad these last several days." Patient reported the amounts of alcohol he drinks "I drink like a fish every day." Patient continues to report SI with a plan "I'll jump off a bridge." Patient also reports HI "yeah if I could I would hurt my mother" and endorses AH "the voices tell me to hurt myself and others." Patient seems to be manipulating his answers as if to say things that writer would want to hear. Patient does not appear to be responding to iny internal or external stimuli.   Per Psyc NP Nira Conn patient to be observed overnight and reassess in the morning  Diagnosis: Substance-Induced Mood Disorder, Alcohol Use Disorder Severe  Past Medical History:  Past Medical History:  Diagnosis Date  . ASD (atrial septal defect)   . Bacterial infection due to H. pylori   . Hypertension   . Kidney stones     Past Surgical History:  Procedure Laterality Date  . ASD REPAIR    . FOOT SURGERY Right   . KNEE SURGERY Right     Family History: No family history on file.  Social History:  reports that he has been smoking cigarettes. He has never used smokeless  tobacco. He reports current alcohol use. He reports previous drug use.  Additional Social History:  Alcohol / Drug Use Pain Medications: See MAR Prescriptions: See MAR Over the Counter: See MAR History of alcohol / drug use?: Yes Substance #1 Name of Substance 1: Alcohol  CIWA: CIWA-Ar BP: 109/83 Pulse Rate: 85 COWS:    Allergies:  Allergies  Allergen Reactions  . Penicillins     Did it involve swelling of the face/tongue/throat, SOB, or low BP? No Did it involve sudden or severe rash/hives, skin peeling, or any reaction on the inside of your mouth or nose? No Did you need to seek medical attention at a hospital or doctor's office? Unknown When did it last happen? If all above answers are "NO", may proceed with cephalosporin use.    Home Medications: (Not in a hospital admission)   OB/GYN Status:  No LMP for male patient.  General Assessment Data Location of Assessment: [facility] ED TTS Assessment: In system Is this a Tele or Face-to-Face Assessment?: Face-to-Face Is this an Initial Assessment or a Re-assessment for this encounter?: Initial Assessment Patient Accompanied by:: N/A Language Other than English: No Living Arrangements: Homeless/Shelter What gender do you identify as?: Male Marital status: Single Pregnancy Status: No Living Arrangements: Other (Comment) Can pt return to current living arrangement?: Yes Admission Status: Voluntary Is patient capable of signing voluntary admission?: Yes Referral Source: Self/Family/Friend Insurance type: None  Medical  Screening Exam [facility] Walk-in ONLY) Medical Exam completed: Yes  Crisis Care Plan Living Arrangements: Other (Comment) Legal Guardian: Other: (Self) Name of Psychiatrist: None Name of Therapist: None  Education Status Is patient currently in school?: No Highest grade of school patient has completed: 10th and recieved GED Is the patient employed, unemployed or receiving disability?: Unemployed  Risk  to self with the past 6 months Suicidal Ideation: Yes-Currently Present Has patient been a risk to self within the past 6 months prior to admission? : Yes Suicidal Intent: Yes-Currently Present Has patient had any suicidal intent within the past 6 months prior to admission? : Yes Is patient at risk for suicide?: Yes Suicidal Plan?: Yes-Currently Present Has patient had any suicidal plan within the past 6 months prior to admission? : Yes Specify Current Suicidal Plan: "to jump off a bridge" Access to Means: Yes Specify Access to Suicidal Means: Patient has access to roads What has been your use of drugs/alcohol within the last 12 months?: Alcohol Abuse Previous Attempts/Gestures: No How many times?: 0 Other Self Harm Risks: None Triggers for Past Attempts: None known Intentional Self Injurious Behavior: None Family Suicide History: Unknown Recent stressful life event(s): Other (Comment) (Homeless) Persecutory voices/beliefs?: No Depression: Yes Depression Symptoms: Isolating, Loss of interest in usual pleasures, Feeling worthless/self pity, Feeling angry/irritable Substance abuse history and/or treatment for substance abuse?: Yes Suicide prevention information given to non-admitted patients: Not applicable  Risk to Others within the past 6 months Homicidal Ideation: Yes-Currently Present Does patient have any lifetime risk of violence toward others beyond the six months prior to admission? : No Thoughts of Harm to Others: No-Not Currently Present/Within Last 6 Months Current Homicidal Intent: No-Not Currently/Within Last 6 Months Current Homicidal Plan: No-Not Currently/Within Last 6 Months Access to Homicidal Means:  (Unknown) Identified Victim: "If I could my momma" History of harm to others?: No Assessment of Violence: None Noted Violent Behavior Description: None Does patient have access to weapons?: No Criminal Charges Pending?: No Describe Pending Criminal Charges:  None Does patient have a court date: No Court Date:  (Patient reports sometime in September) Is patient on probation?: Unknown  Psychosis Delusions: None noted  Mental Status Report Appearance/Hygiene: In scrubs Eye Contact: Good Motor Activity: Freedom of movement Speech: Logical/coherent Level of Consciousness: Alert Mood: Pleasant Affect: Appropriate to circumstance Anxiety Level: Moderate Thought Processes: Coherent Judgement: Unimpaired Orientation: Person, Place, Situation, Time, Appropriate for developmental age Obsessive Compulsive Thoughts/Behaviors: None  Cognitive Functioning Concentration: Normal Memory: Recent Intact, Remote Intact Is patient IDD: No Insight: Fair Impulse Control: Fair Appetite: Poor Have you had any weight changes? : No Change Sleep: Decreased Total Hours of Sleep: 0 Vegetative Symptoms: None  ADLScreening [facility] Assessment Services) Patient's cognitive ability adequate to safely complete daily activities?: Yes Patient able to express need for assistance with ADLs?: Yes Independently performs ADLs?: Yes (appropriate for developmental age)  Prior Inpatient Therapy Prior Inpatient Therapy: Yes Prior Therapy Dates: 09/01/19 Prior Therapy Facilty/Provider(s): [facility] Reason for Treatment: Bipolar I; ETOH abuse  Prior Outpatient Therapy Prior Outpatient Therapy: No Does patient have an ACCT team?: No Does patient have Intensive In-House Services?  : No Does patient have [facility] services? : No Does patient have P4CC services?: No  ADL Screening (condition at time of admission) Patient's cognitive ability adequate to safely complete daily activities?: Yes Is the patient deaf or have difficulty hearing?: No Does the patient have difficulty seeing, even when wearing glasses/contacts?: No Does the patient have difficulty concentrating, remembering,  or making decisions?: No Patient able to express need for assistance with ADLs?: Yes Does  the patient have difficulty dressing or bathing?: No Independently performs ADLs?: Yes (appropriate for developmental age) Does the patient have difficulty walking or climbing stairs?: No Weakness of Legs: None Weakness of Arms/Hands: None  Home Assistive Devices/Equipment Home Assistive Devices/Equipment: None  Therapy Consults (therapy consults require a physician order) PT Evaluation Needed: No OT Evalulation Needed: No SLP Evaluation Needed: No Abuse/Neglect Assessment (Assessment to be complete while patient is alone) Abuse/Neglect Assessment Can Be Completed: Yes Physical Abuse: Denies Verbal Abuse: Denies Sexual Abuse: Denies Exploitation of patient/patient's resources: Denies Self-Neglect: Denies Values / Beliefs Cultural Requests During Hospitalization: None Spiritual Requests During Hospitalization: None Consults Spiritual Care Consult Needed: No Transition of Care Team Consult Needed: No            Disposition: Per Psyc NP Nira Conn patient to be observed overnight and reassess in the morning Disposition Initial Assessment Completed for this Encounter: Yes  On Site Evaluation by:   Reviewed with Physician:    Benay Pike MS LCASA 10/26/2019 12:46 AM

## 2019-10-26 NOTE — BH Assessment (Signed)
Writer spoke with the patient to complete an updated/reassessment. Patient was oriented x4 and drowsy upon interview. Patient passively endorsed SI but did not articulate a plan. Patient also reports hearing voices and described them as faint whispers. Per psych NP Celso Amy, pt to be observed overnight and discharged on 10/27/19.

## 2019-10-26 NOTE — ED Provider Notes (Signed)
-----------------------------------------   5:59 AM on 10/26/2019 -----------------------------------------  Patient is feeling better and is medically cleared.  We are getting another troponin for the sake of completion but I have a very low suspicion of acute medical issue.  His potassium is 3.0 and I ordered potassium 40 mEq by mouth for repletion.  He may moved to the [facility] for reassessment this morning.  He was seen by TTS last night and they recommended reassessment in the morning.  The patient has been placed in psychiatric observation due to the need to provide a safe environment for the patient while obtaining psychiatric consultation and evaluation, as well as ongoing medical and medication management to treat the patient's condition.  The patient has not been placed under full IVC at this time.    Loleta Rose, MD 10/26/19 0600

## 2019-10-27 ENCOUNTER — Emergency Department
Admission: EM | Admit: 2019-10-27 | Discharge: 2019-10-27 | Disposition: A | Discharge status: Home / Self Care | Payer: Self-pay | Attending: Emergency Medicine | Admitting: Emergency Medicine

## 2019-10-27 ENCOUNTER — Encounter: Payer: Self-pay | Admitting: Emergency Medicine

## 2019-10-27 ENCOUNTER — Other Ambulatory Visit: Payer: Self-pay

## 2019-10-27 DIAGNOSIS — F319 Bipolar disorder, unspecified: Secondary | ICD-10-CM

## 2019-10-27 DIAGNOSIS — Y999 Unspecified external cause status: Secondary | ICD-10-CM | POA: Insufficient documentation

## 2019-10-27 DIAGNOSIS — Y939 Activity, unspecified: Secondary | ICD-10-CM | POA: Insufficient documentation

## 2019-10-27 DIAGNOSIS — W19XXXA Unspecified fall, initial encounter: Secondary | ICD-10-CM | POA: Insufficient documentation

## 2019-10-27 DIAGNOSIS — S7002XA Contusion of left hip, initial encounter: Secondary | ICD-10-CM | POA: Insufficient documentation

## 2019-10-27 DIAGNOSIS — S93402A Sprain of unspecified ligament of left ankle, initial encounter: Secondary | ICD-10-CM | POA: Insufficient documentation

## 2019-10-27 DIAGNOSIS — I10 Essential (primary) hypertension: Secondary | ICD-10-CM | POA: Insufficient documentation

## 2019-10-27 DIAGNOSIS — F17210 Nicotine dependence, cigarettes, uncomplicated: Secondary | ICD-10-CM | POA: Insufficient documentation

## 2019-10-27 DIAGNOSIS — Y9289 Other specified places as the place of occurrence of the external cause: Secondary | ICD-10-CM | POA: Insufficient documentation

## 2019-10-27 MED ORDER — IBUPROFEN 600 MG PO TABS
600.0000 mg | ORAL_TABLET | Freq: Four times a day (QID) | ORAL | Status: DC | PRN
  Administered 2019-10-27: 600 mg via ORAL
  Filled 2019-10-27: qty 1

## 2019-10-27 MED ORDER — IBUPROFEN 600 MG PO TABS
600.0000 mg | ORAL_TABLET | Freq: Four times a day (QID) | ORAL | 0 refills | Status: DC | PRN
Start: 2019-10-27 — End: 2020-10-10

## 2019-10-27 MED ORDER — HYDROXYZINE HCL 25 MG PO TABS: 25.0000 mg | ORAL_TABLET | Freq: Four times a day (QID) | ORAL | 0 refills | Status: DC | PRN

## 2019-10-27 MED ORDER — HALOPERIDOL 5 MG PO TABS: 5.0000 mg | ORAL_TABLET | Freq: Two times a day (BID) | ORAL | 1 refills | Status: DC

## 2019-10-27 MED ORDER — GABAPENTIN 300 MG PO CAPS
300.0000 mg | ORAL_CAPSULE | Freq: Three times a day (TID) | ORAL | 1 refills | Status: DC
Start: 2019-10-27 — End: 2020-10-10

## 2019-10-27 NOTE — ED Notes (Signed)
This RN obtained pt's DC paperwork and went to complete pt's triage then DC. This RN asked Dr. Cyril Loosen about pt stating that he was suicidal to this RN. Dr Cyril Loosen stated this pt had been evaluated and DC by psychiatry early today and was not a suicide risk.

## 2019-10-27 NOTE — ED Notes (Signed)
Patient placed in Triage core.  Belongings in backpack kept with First Nurse for safety.

## 2019-10-27 NOTE — ED Provider Notes (Signed)
[facility] Emergency Department Provider Note   ____________________________________________    I have reviewed the triage vital signs and the nursing notes.   HISTORY  Chief Complaint Fall     HPI John [name] is a 39 y.o. male with a history of suicidal ideation, malingering, bipolar disorder presents after a reported fall.  Patient reports he tripped and fell injuring his left ankle and his left hip.  He is able to ambulate.  Review of medical records demonstrates the patient was discharged today after being evaluated by psychiatry and cleared for discharge.  Past Medical History:  Diagnosis Date  . ASD (atrial septal defect)   . Bacterial infection due to H. pylori   . Hypertension   . Kidney stones     Patient Active Problem List   Diagnosis Date Noted  . Suicidal ideation   . Malingerer 10/07/2019  . Bipolar 1 disorder (HCC) 09/09/2019  . Chronic back pain 08/10/2019  . Encounter to establish care 07/12/2019  . History of depression 07/12/2019  . Smoking 07/12/2019  . Depressive disorder 06/14/2019  . Sepsis (HCC) 04/30/2019  . Fall 04/30/2019  . Left leg pain 04/30/2019  . Orthopedic hardware present 04/30/2019  . S/P cystoscopy 04/20/19 [facility]. Hx urethroplasty for urethral trauma in 2020 04/30/2019  . Alcohol abuse with intoxication (HCC) 04/30/2019  . Acute metabolic encephalopathy 04/30/2019  . Hypoxia 04/30/2019  . Acute Femoral condyle fracture (HCC) 04/30/2019  . Severe sepsis (HCC) 04/30/2019  . Alcohol abuse 01/14/2018  . Substance induced mood disorder (HCC) 01/14/2018    Past Surgical History:  Procedure Laterality Date  . ASD REPAIR    . FOOT SURGERY Right   . KNEE SURGERY Right     Prior to Admission medications   Medication Sig Start Date End Date Taking? Authorizing Provider  gabapentin (NEURONTIN) 300 MG capsule Take 1 capsule (300 mg total) by mouth 3 (three) times daily. 10/27/19   Charm Rings, NP   haloperidol (HALDOL) 5 MG tablet Take 1 tablet (5 mg total) by mouth 2 (two) times daily. 10/27/19   Charm Rings, NP  hydrOXYzine (ATARAX/VISTARIL) 25 MG tablet Take 1 tablet (25 mg total) by mouth every 6 (six) hours as needed (anxiety/agitation or CIWA < or = 10). 10/27/19   Charm Rings, NP  ibuprofen (ADVIL) 600 MG tablet Take 1 tablet (600 mg total) by mouth every 6 (six) hours as needed for moderate pain. 10/27/19   Charm Rings, NP     Allergies Penicillins  History reviewed. No pertinent family history.  Social History Social History   Tobacco Use  . Smoking status: Current Every Day Smoker    Types: Cigarettes  . Smokeless tobacco: Never Used  . Tobacco comment: down to 2 cigarettes a day  Vaping Use  . Vaping Use: Never used  Substance Use Topics  . 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 Drug use: Not Currently    Review of Systems  Constitutional: No dizziness      Musculoskeletal: Left ankle and left hip pain Skin: Negative for laceration or abrasion Neurological: Negative for headaches     ____________________________________________   PHYSICAL EXAM:  VITAL SIGNS: ED Triage Vitals  Enc Vitals Group     BP 10/27/19 1951 138/65     Pulse Rate 10/27/19 1951 96     Resp 10/27/19 1951 18  Temp 10/27/19 1951 98.4 F (36.9 C)     Temp Source 10/27/19 1951 Oral     SpO2 10/27/19 1951 98 %     Weight 10/27/19 2006 79.8 kg (176 lb)     Height 10/27/19 2006 1.753 m (5\' 9" )     Head Circumference --      Peak Flow --      Pain Score 10/27/19 2006 8     Pain Loc --      Pain Edu? --      Excl. in GC? --      Constitutional: Alert and oriented.  Eyes: Conjunctivae are normal.  Head: Atraumatic. Nose: No congestion/rhinnorhea. Mouth/Throat: Mucous membranes are moist.   Cardiovascular: Normal rate, regular rhythm.  Respiratory: Normal respiratory effort.  No  retractions.  Musculoskeletal: No ankle swelling, full range of motion, no bony abnormalities.  Full flexion at the hip, able to ambulate well without difficulty..   Neurologic:  Normal speech and language. No gross focal neurologic deficits are appreciated.   Skin:  Skin is warm, dry and intact. No rash noted.   ____________________________________________   LABS (all labs ordered are listed, but only abnormal results are displayed)  Labs Reviewed - No data to display ____________________________________________  EKG   ____________________________________________  RADIOLOGY  None ____________________________________________   PROCEDURES  Procedure(s) performed: No  Procedures   Critical Care performed: No ____________________________________________   INITIAL IMPRESSION / ASSESSMENT AND PLAN / ED COURSE  Pertinent labs & imaging results that were available during my care of the patient were reviewed by me and considered in my medical decision making (see chart for details).  Patient with mechanical fall as described above, reassuring exam consistent with possibly mild ankle sprain and left hip contusion.  Ambulating well, no indication for imaging at this time.  Patient with chronic suicidality, evaluated by psychiatry earlier today and cleared for discharge, no indication for repeat evaluation today appropriate for discharge with outpatient follow-up   ____________________________________________   FINAL CLINICAL IMPRESSION(S) / ED DIAGNOSES  Final diagnoses:  Sprain of left ankle, unspecified ligament, initial encounter  Contusion of left hip, initial encounter      NEW MEDICATIONS STARTED DURING THIS VISIT:  Discharge Medication List as of 10/27/2019  7:53 PM       Note:  This document was prepared using Dragon voice recognition software and may include unintentional dictation errors.   12/27/2019, MD 10/27/19 2032

## 2019-10-27 NOTE — ED Notes (Signed)
Hourly rounding reveals patient in room. No complaints, stable, in no acute distress. Q15 minute rounds and monitoring via Security Cameras to continue. 

## 2019-10-27 NOTE — ED Notes (Signed)
This RN verified once more that it was safe for this pt to be DC w/ Dr Cyril Loosen and he stated yes it was.

## 2019-10-27 NOTE — ED Notes (Signed)
This RN read [facility] assessment notes from earlier this afternoon and pt denied HI/SI to [facility] provider.

## 2019-10-27 NOTE — ED Triage Notes (Signed)
First Nurse Note:  Larey Seat while walking by the circle k.  C/O left hip, left ankle pain and wanting to kill himself.  Patient is AAOx3.  Skin warma nd dry.  Ambulates with easy and steady gait.  NAD
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
## 2019-10-27 NOTE — ED Provider Notes (Signed)
-----------------------------------------   11:58 AM on 10/27/2019 -----------------------------------------  Patient has been seen and evaluated by psychiatry.  They believe the patient safe for discharge home from a psychiatric standpoint.  I have personally seen and evaluated the patient this morning, his only medical complaint is of lower back pain but discussed nonnarcotic treatment.  Patient will be discharged home.   Minna Antis, MD 10/27/19 1158

## 2019-10-27 NOTE — BH Assessment (Signed)
Writer spoke with the patient to complete an updated/reassessment. Patient was sleeping upon this writer's arrival. Patient denies SI/HI and AV/H. Per Haig Prophet, patient does not meet inpatient criteria and can be discharged.

## 2019-10-27 NOTE — ED Provider Notes (Signed)
Emergency Medicine Observation Re-evaluation Note  John [name] is a 39 y.o. male, seen on rounds today.  Pt initially presented to the ED for complaints of Chest Pain and Suicidal Currently, the patient is calm cooperative, no distress.  Was sleeping comfortably but awakens easily to voice and answers questions appropriately.  Physical Exam  BP 117/73 (BP Location: Right Arm)   Pulse 70   Temp 98.6 F (37 C) (Oral)   Resp 18   Ht 5\' 9"  (1.753 m)   Wt 79.8 kg   SpO2 99%   BMI 25.99 kg/m  Physical Exam General: Calm cooperative, lying in bed, no distress. Cardiac: Regular rate and rhythm around 70 bpm without obvious murmur Lungs: Regular respiratory rate, clear bilaterally without wheeze rales rhonchi Psych: Calm, cooperative, no distress  ED Course / MDM    I have reviewed the labs performed to date as well as medications administered while in observation.  No new labs over the past 24 hours.  Plan  Current plan is for psychiatry to reevaluate today to decide upon disposition. Patient is not under full IVC at this time.   , MD 10/27/19 0930

## 2019-10-27 NOTE — ED Triage Notes (Signed)
Pt arrived via [facility] after a fall walking by [address]. Pt c/o left hip and ankle pain. Pt states that he wants to kill himself. Pt was tx and DC for SI yesterday.

## 2019-10-27 NOTE — Discharge Instructions (Signed)
You have been seen in the emergency department for a  psychiatric concern. You have been evaluated both medically as well as psychiatrically. Please follow-up with your outpatient resources provided. Return to the emergency department for any worsening symptoms, or any thoughts of hurting yourself or anyone else so that we may attempt to help you.  [facility] ([facility]) & [facility]  [place] Address: [address] [place], [place] [address] Hours:  Open ? Closes 5PM Phone: [phone]  [facility] Address: [address], [place], [place] [address] Hours:  Open ? Closes 5PM Phone: [phone]

## 2019-11-01 ENCOUNTER — Other Ambulatory Visit: Payer: Self-pay

## 2019-11-01 ENCOUNTER — Encounter: Payer: Self-pay | Admitting: *Deleted

## 2019-11-01 DIAGNOSIS — Y906 Blood alcohol level of 120-199 mg/100 ml: Secondary | ICD-10-CM | POA: Insufficient documentation

## 2019-11-01 DIAGNOSIS — I10 Essential (primary) hypertension: Secondary | ICD-10-CM | POA: Insufficient documentation

## 2019-11-01 DIAGNOSIS — Z20822 Contact with and (suspected) exposure to covid-19: Secondary | ICD-10-CM | POA: Insufficient documentation

## 2019-11-01 DIAGNOSIS — W01198A Fall on same level from slipping, tripping and stumbling with subsequent striking against other object, initial encounter: Secondary | ICD-10-CM | POA: Insufficient documentation

## 2019-11-01 DIAGNOSIS — Y939 Activity, unspecified: Secondary | ICD-10-CM | POA: Insufficient documentation

## 2019-11-01 DIAGNOSIS — Y999 Unspecified external cause status: Secondary | ICD-10-CM | POA: Insufficient documentation

## 2019-11-01 DIAGNOSIS — R45851 Suicidal ideations: Secondary | ICD-10-CM | POA: Insufficient documentation

## 2019-11-01 DIAGNOSIS — F1010 Alcohol abuse, uncomplicated: Secondary | ICD-10-CM | POA: Insufficient documentation

## 2019-11-01 DIAGNOSIS — Y929 Unspecified place or not applicable: Secondary | ICD-10-CM | POA: Insufficient documentation

## 2019-11-01 DIAGNOSIS — F17210 Nicotine dependence, cigarettes, uncomplicated: Secondary | ICD-10-CM | POA: Insufficient documentation

## 2019-11-01 LAB — ACETAMINOPHEN LEVEL: Acetaminophen (Tylenol), Serum: <10 ug/mL — ABNORMAL LOW (ref 10–30)

## 2019-11-01 LAB — SALICYLATE LEVEL: Salicylate Lvl: <7 mg/dL — ABNORMAL LOW (ref 7.0–30.0)

## 2019-11-01 LAB — CBC
HCT: 45.4 % (ref 39.0–52.0)
Hemoglobin: 16 g/dL (ref 13.0–17.0)
MCH: 31.6 pg (ref 26.0–34.0)
MCHC: 35.2 g/dL (ref 30.0–36.0)
MCV: 89.7 fL (ref 80.0–100.0)
Platelets: 237 10*3/uL (ref 150–400)
RBC: 5.06 MIL/uL (ref 4.22–5.81)
RDW: 14.1 % (ref 11.5–15.5)
WBC: 6.1 10*3/uL (ref 4.0–10.5)
nRBC: 0 % (ref 0.0–0.2)

## 2019-11-01 LAB — COMPREHENSIVE METABOLIC PANEL
ALT: 16 U/L (ref 0–44)
AST: 22 U/L (ref 15–41)
Albumin: 4.2 g/dL (ref 3.5–5.0)
Alkaline Phosphatase: 95 U/L (ref 38–126)
Anion gap: 6 (ref 5–15)
BUN: 8 mg/dL (ref 6–20)
CO2: 28 mmol/L (ref 22–32)
Calcium: 9.2 mg/dL (ref 8.9–10.3)
Chloride: 104 mmol/L (ref 98–111)
Creatinine, Ser: 0.73 mg/dL (ref 0.61–1.24)
GFR calc Af Amer: >60 mL/min (ref >60)
GFR calc non Af Amer: >60 mL/min (ref >60)
Glucose, Bld: 95 mg/dL (ref 70–99)
Potassium: 3.2 mmol/L — ABNORMAL LOW (ref 3.5–5.1)
Sodium: 138 mmol/L (ref 135–145)
Total Bilirubin: 0.6 mg/dL (ref 0.3–1.2)
Total Protein: 8.1 g/dL (ref 6.5–8.1)

## 2019-11-01 LAB — ETHANOL: Alcohol, Ethyl (B): 131 mg/dL — ABNORMAL HIGH (ref <10)

## 2019-11-01 NOTE — ED Notes (Signed)
Wallace Cullens sneakers Blue jeans Midwife Plaid boxers White socks Green book Financial trader green watch

## 2019-11-01 NOTE — ED Triage Notes (Signed)
Pt brought in via ems with SI.  Pt fell and hit head.  etoh use today.  Denies drug use  Pt calm and cooperative in triage.

## 2019-11-02 ENCOUNTER — Emergency Department
Admission: EM | Admit: 2019-11-02 | Discharge: 2019-11-02 | Disposition: A | Discharge status: Home / Self Care | Payer: Self-pay | Attending: Emergency Medicine | Admitting: Emergency Medicine

## 2019-11-02 ENCOUNTER — Emergency Department: Payer: Self-pay

## 2019-11-02 DIAGNOSIS — F1010 Alcohol abuse, uncomplicated: Secondary | ICD-10-CM

## 2019-11-02 DIAGNOSIS — R45851 Suicidal ideations: Secondary | ICD-10-CM

## 2019-11-02 LAB — SARS CORONAVIRUS 2 BY RT PCR (HOSPITAL ORDER, PERFORMED IN ~~LOC~~ HOSPITAL LAB): SARS Coronavirus 2: NEGATIVE

## 2019-11-02 MED ORDER — IBUPROFEN 600 MG PO TABS: 600.0000 mg | ORAL_TABLET | Freq: Three times a day (TID) | ORAL | Status: DC | PRN

## 2019-11-02 MED ORDER — HALOPERIDOL 5 MG PO TABS: 5.0000 mg | ORAL_TABLET | Freq: Every day | ORAL | Status: DC

## 2019-11-02 MED ORDER — DULOXETINE HCL 30 MG PO CPEP
30.0000 mg | ORAL_CAPSULE | Freq: Every day | ORAL | Status: DC
  Administered 2019-11-02: 30 mg via ORAL
  Filled 2019-11-02: qty 1

## 2019-11-02 MED ORDER — THIAMINE HCL 100 MG PO TABS: 100.0000 mg | ORAL_TABLET | Freq: Every day | ORAL | Status: DC

## 2019-11-02 MED ORDER — LORAZEPAM 2 MG PO TABS: 0.0000 mg | ORAL_TABLET | Freq: Four times a day (QID) | ORAL | Status: DC

## 2019-11-02 MED ORDER — IBUPROFEN 600 MG PO TABS: 600.0000 mg | ORAL_TABLET | Freq: Four times a day (QID) | ORAL | Status: DC | PRN

## 2019-11-02 MED ORDER — LORAZEPAM 2 MG/ML IJ SOLN: 0.0000 mg | Freq: Four times a day (QID) | INTRAMUSCULAR | Status: DC

## 2019-11-02 MED ORDER — LORAZEPAM 2 MG PO TABS: 0.0000 mg | ORAL_TABLET | Freq: Two times a day (BID) | ORAL | Status: DC

## 2019-11-02 MED ORDER — THIAMINE HCL 100 MG/ML IJ SOLN: 100.0000 mg | Freq: Every day | INTRAMUSCULAR | Status: DC

## 2019-11-02 MED ORDER — MELATONIN 5 MG PO TABS
10.0000 mg | ORAL_TABLET | Freq: Every day | ORAL | Status: DC
  Filled 2019-11-02: qty 2

## 2019-11-02 MED ORDER — ONDANSETRON HCL 4 MG PO TABS: 4.0000 mg | ORAL_TABLET | Freq: Three times a day (TID) | ORAL | Status: DC | PRN

## 2019-11-02 MED ORDER — ALUM & MAG HYDROXIDE-SIMETH 200-200-20 MG/5ML PO SUSP: 30.0000 mL | Freq: Four times a day (QID) | ORAL | Status: DC | PRN

## 2019-11-02 MED ORDER — HALOPERIDOL 5 MG PO TABS: 5.0000 mg | ORAL_TABLET | Freq: Two times a day (BID) | ORAL | Status: DC

## 2019-11-02 MED ORDER — GABAPENTIN 300 MG PO CAPS
300.0000 mg | ORAL_CAPSULE | Freq: Three times a day (TID) | ORAL | Status: DC
  Administered 2019-11-02: 300 mg via ORAL
  Filled 2019-11-02: qty 1

## 2019-11-02 MED ORDER — BENZTROPINE MESYLATE 1 MG PO TABS: 0.5000 mg | ORAL_TABLET | Freq: Two times a day (BID) | ORAL | Status: DC

## 2019-11-02 MED ORDER — LORAZEPAM 2 MG/ML IJ SOLN: 0.0000 mg | Freq: Two times a day (BID) | INTRAMUSCULAR | Status: DC

## 2019-11-02 MED ORDER — ACETAMINOPHEN 325 MG PO TABS: 650.0000 mg | ORAL_TABLET | ORAL | Status: DC | PRN

## 2019-11-02 NOTE — Discharge Instructions (Addendum)
Please seek medical attention and help for any thoughts about wanting to harm yourself, harm others, any concerning change in behavior, severe depression, inappropriate drug use or any other new or concerning symptoms. ° °

## 2019-11-02 NOTE — Progress Notes (Addendum)
Patient ID: John [name], male   DOB: 06-Sep-1980, 39 y.o.   MRN: [id number]   Brief Psych Note  Patient seen already by NP last Pm   Well known to Psych and Medicine now on CIWA protocol and then to shelter  CiWA written and also home meds   No other new change for now mental status about the same   No active SI HI or plans  No other new medical problems or side effects Home meds reactivated and modified     History of ETOH dependence Bipolar depressive phase ETOH intoxication and withdrawals Personality disorder NOS    [name] MD

## 2019-11-02 NOTE — BH Assessment (Signed)
Assessment Note  John [name] is an 39 y.o. male. Per triage note: Pt brought in via ems with SI.  Pt fell and hit head.  etoh use today.  Denies drug use  Pt calm and cooperative in triage.  Pt presented to the voluntarily endorsing SI and falling. Pt was resting quietly upon this writers arrival. When asked what had brought him into the ED the patient stated, I drank too much yesterday, and I fell. Pt had disheveled appearance and was calm and cooperative. The patient had logical/coherent speech. The patient explained that he currently lives in a hotel with his friend.  The patient endorsed symptoms of depression and SI without a plan. The pt reported that he is not connected to a psychiatrist or therapist. Denies any hallucinations, suicidal/homicidal ideations, or paranoia. The patient expressed a desire to get on medications.   Diagnosis: Bipolar; depression  Past Medical History:  Past Medical History:  Diagnosis Date   ASD (atrial septal defect)    Bacterial infection due to H. pylori    Hypertension    Kidney stones     Past Surgical History:  Procedure Laterality Date   ASD REPAIR     FOOT SURGERY Right    KNEE SURGERY Right     Family History: No family history on file.  Social History:  reports that he has been smoking cigarettes. He has never used smokeless tobacco. He reports current alcohol use. He reports previous drug use.  Additional Social History:  Alcohol / Drug Use Pain Medications: See PTA Prescriptions: See PTA History of alcohol / drug use?: Yes Substance #1 Name of Substance 1: Alcohol  CIWA: CIWA-Ar BP: 121/72 Pulse Rate: 92 COWS:    Allergies:  Allergies  Allergen Reactions   Penicillins     Did it involve swelling of the face/tongue/throat, SOB, or low BP? No Did it involve sudden or severe rash/hives, skin peeling, or any reaction on the inside of your mouth or nose? No Did you need to seek medical attention at a hospital or  doctor's office? Unknown When did it last happen? If all above answers are NO, may proceed with cephalosporin use.    Home Medications: (Not in a hospital admission)   OB/GYN Status:  No LMP for male patient.  General Assessment Data Location of Assessment: [facility] ED TTS Assessment: In system Is this a Tele or Face-to-Face Assessment?: Face-to-Face Is this an Initial Assessment or a Re-assessment for this encounter?: Initial Assessment Patient Accompanied by:: N/A Language Other than English: No Living Arrangements: Homeless/Shelter What gender do you identify as?: Male Date Telepsych consult ordered in [facility]: 11/02/19 Time Telepsych consult ordered in [facility]: 1519 Marital status: Single Pregnancy Status: No Living Arrangements: Other (Comment) Can pt return to current living arrangement?: Yes Admission Status: Voluntary Is patient capable of signing voluntary admission?: Yes Referral Source: Self/Family/Friend Insurance type: None  Medical Screening Exam [facility] Walk-in ONLY) Medical Exam completed: Yes  Crisis Care Plan Living Arrangements: Other (Comment) Legal Guardian: Other: (Self) Name of Psychiatrist: None Name of Therapist: None  Education Status Is patient currently in school?: No Highest grade of school patient has completed: 10th and recieved GED Is the patient employed, unemployed or receiving disability?: Unemployed  Risk to self with the past 6 months Suicidal Ideation: Yes-Currently Present Has patient been a risk to self within the past 6 months prior to admission? : Yes Suicidal Intent: No Has patient had any suicidal intent within the past 6 months prior  to admission? : Yes Is patient at risk for suicide?: No Suicidal Plan?: No Has patient had any suicidal plan within the past 6 months prior to admission? : Yes Specify Current Suicidal Plan: None noted Access to Means: No Specify Access to Suicidal Means: n/a What has been your use of  drugs/alcohol within the last 12 months?: Alcohol Previous Attempts/Gestures: No How many times?: 0 Other Self Harm Risks: n/a Triggers for Past Attempts: None known Intentional Self Injurious Behavior: None Family Suicide History: Unknown Recent stressful life event(s): Conflict (Comment) Persecutory voices/beliefs?: No Depression: Yes Depression Symptoms: Feeling worthless/self pity Substance abuse history and/or treatment for substance abuse?: Yes Suicide prevention information given to non-admitted patients: Not applicable  Risk to Others within the past 6 months Homicidal Ideation: No Does patient have any lifetime risk of violence toward others beyond the six months prior to admission? : No Thoughts of Harm to Others: No Current Homicidal Intent: No Current Homicidal Plan: No Access to Homicidal Means: No Identified Victim: n/a History of harm to others?: No Assessment of Violence: None Noted Violent Behavior Description: n/a Does patient have access to weapons?: No Criminal Charges Pending?: No Describe Pending Criminal Charges: none Does patient have a court date: No Is patient on probation?: No  Psychosis Hallucinations: None noted Delusions: None noted  Mental Status Report Appearance/Hygiene: In scrubs Eye Contact: Good Motor Activity: Freedom of movement Speech: Logical/coherent Level of Consciousness: Alert Mood: Helpless, Depressed Affect: Appropriate to circumstance Anxiety Level: None Thought Processes: Coherent Judgement: Unimpaired Orientation: Person, Place, Situation, Time, Appropriate for developmental age Obsessive Compulsive Thoughts/Behaviors: None  Cognitive Functioning Concentration: Normal Memory: Recent Intact, Remote Intact Is patient IDD: No Insight: Fair Impulse Control: Fair Appetite: Fair Have you had any weight changes? : No Change Sleep: No Change Total Hours of Sleep: 0 Vegetative Symptoms: None  ADLScreening [facility]  Assessment Services) Patient's cognitive ability adequate to safely complete daily activities?: Yes Patient able to express need for assistance with ADLs?: Yes Independently performs ADLs?: Yes (appropriate for developmental age)  Prior Inpatient Therapy Prior Inpatient Therapy: Yes Prior Therapy Dates: 09/01/19 Prior Therapy Facilty/Provider(s): [facility] Reason for Treatment: Bipolar I; ETOH abuse  Prior Outpatient Therapy Prior Outpatient Therapy: No Does patient have an ACCT team?: No Does patient have Intensive In-House Services?  : No Does patient have [facility] services? : No Does patient have P4CC services?: No  ADL Screening (condition at time of admission) Patient's cognitive ability adequate to safely complete daily activities?: Yes Is the patient deaf or have difficulty hearing?: No Does the patient have difficulty seeing, even when wearing glasses/contacts?: No Does the patient have difficulty concentrating, remembering, or making decisions?: No Patient able to express need for assistance with ADLs?: Yes Does the patient have difficulty dressing or bathing?: No Independently performs ADLs?: Yes (appropriate for developmental age) Communication: Independent Dressing (OT): Independent Grooming: Independent Feeding: Independent Bathing: Independent Toileting: Independent In/Out Bed: Independent Walks in Home: Independent Does the patient have difficulty walking or climbing stairs?: No Weakness of Legs: None Weakness of Arms/Hands: None  Home Assistive Devices/Equipment Home Assistive Devices/Equipment: None  Therapy Consults (therapy consults require a physician order) PT Evaluation Needed: No OT Evalulation Needed: No SLP Evaluation Needed: No Abuse/Neglect Assessment (Assessment to be complete while patient is alone) Physical Abuse: Denies Verbal Abuse: Denies Sexual Abuse: Denies Exploitation of patient/patient's resources: Denies Self-Neglect:  Denies Values / Beliefs Cultural Requests During Hospitalization: None Spiritual Requests During Hospitalization: None Consults Spiritual Care Consult Needed: No Transition  of Care Team Consult Needed: No Advance Directives (For Healthcare) Does Patient Have a Medical Advance Directive?: No          Disposition: Per psych MD Dr. Smith Robert, pt can be discharged after CIWA clearance.  Disposition Initial Assessment Completed for this Encounter: Yes  On Site Evaluation by:   Reviewed with Physician:    Foy Guadalajara 11/02/2019 4:41 PM

## 2019-11-02 NOTE — Consult Note (Signed)
  This note is in error  Current Facility-Administered Medications  Medication Dose Route Frequency Provider Last Rate Last Admin  . acetaminophen (TYLENOL) tablet 650 mg  650 mg Oral Q4H PRN Roselind Messier, MD      . alum & mag hydroxide-simeth (MAALOX/MYLANTA) 200-200-20 MG/5ML suspension 30 mL  30 mL Oral Q6H PRN Roselind Messier, MD      . ibuprofen (ADVIL) tablet 600 mg  600 mg Oral Q8H PRN Sharman Cheek, MD      . LORazepam (ATIVAN) injection 0-4 mg  0-4 mg Intravenous Q6H Roselind Messier, MD       Or  . LORazepam (ATIVAN) tablet 0-4 mg  0-4 mg Oral Q6H Roselind Messier, MD      . Melene Muller ON 11/04/2019] LORazepam (ATIVAN) injection 0-4 mg  0-4 mg Intravenous Q12H Roselind Messier, MD       Or  . Melene Muller ON 11/04/2019] LORazepam (ATIVAN) tablet 0-4 mg  0-4 mg Oral Q12H Roselind Messier, MD      . ondansetron [facility]) tablet 4 mg  4 mg Oral Q8H PRN Sharman Cheek, MD      . thiamine tablet 100 mg  100 mg Oral Daily Roselind Messier, MD       Or  . thiamine (B-1) injection 100 mg  100 mg Intravenous Daily Roselind Messier, MD       Current Outpatient Medications  Medication Sig Dispense Refill  . gabapentin (NEURONTIN) 300 MG capsule Take 1 capsule (300 mg total) by mouth 3 (three) times daily. 90 capsule 1  . haloperidol (HALDOL) 5 MG tablet Take 1 tablet (5 mg total) by mouth 2 (two) times daily. 60 tablet 1  . hydrOXYzine (ATARAX/VISTARIL) 25 MG tablet Take 1 tablet (25 mg total) by mouth every 6 (six) hours as needed (anxiety/agitation or CIWA < or = 10). 30 tablet 0  . ibuprofen (ADVIL) 600 MG tablet Take 1 tablet (600 mg total) by mouth every 6 (six) hours as needed for moderate pain. 30 tablet 0

## 2019-11-02 NOTE — ED Provider Notes (Addendum)
[facility] Emergency Department Provider Note  ____________________________________________  Time seen: Approximately 9:18 AM  I have reviewed the triage vital signs and the nursing notes.   HISTORY  Chief Complaint Suicidal thoughts   HPI John [name] is a 39 y.o. male with h/o htn, depression who c/o SI x 2 days. Reports he ran out of his depression medicine 3 days ago.  Has a plan to hang himself. Reports past suicide attempt by cutting L wrist.  Reports he had been cutting back on alcohol use, but has been drinking more heavily the last 24 hours.  Denies HI/AH/VH.   Last night while drinking, he tried to sit down on a bench but missed and fell to the ground, hitting the back of his head. Denies LOC. No headache, neck pain, or other acute pain complaints.    Pt is currently homeless.     Past Medical History:  Diagnosis Date  . ASD (atrial septal defect)   . Bacterial infection due to H. pylori   . Hypertension   . Kidney stones      Patient Active Problem List   Diagnosis Date Noted  . Suicidal ideation   . Malingerer 10/07/2019  . Bipolar 1 disorder (HCC) 09/09/2019  . Chronic back pain 08/10/2019  . Encounter to establish care 07/12/2019  . History of depression 07/12/2019  . Smoking 07/12/2019  . Depressive disorder 06/14/2019  . Sepsis (HCC) 04/30/2019  . Fall 04/30/2019  . Left leg pain 04/30/2019  . Orthopedic hardware present 04/30/2019  . S/P cystoscopy 04/20/19 [facility]. Hx urethroplasty for urethral trauma in 2020 04/30/2019  . Alcohol abuse with intoxication (HCC) 04/30/2019  . Acute metabolic encephalopathy 04/30/2019  . Hypoxia 04/30/2019  . Acute Femoral condyle fracture (HCC) 04/30/2019  . Severe sepsis (HCC) 04/30/2019  . Alcohol abuse 01/14/2018  . Substance induced mood disorder (HCC) 01/14/2018     Past Surgical History:  Procedure Laterality Date  . ASD REPAIR    . FOOT SURGERY Right   . KNEE SURGERY Right       Prior to Admission medications   Medication Sig Start Date End Date Taking? Authorizing Provider  gabapentin (NEURONTIN) 300 MG capsule Take 1 capsule (300 mg total) by mouth 3 (three) times daily. 10/27/19   Charm Rings, NP  haloperidol (HALDOL) 5 MG tablet Take 1 tablet (5 mg total) by mouth 2 (two) times daily. 10/27/19   Charm Rings, NP  hydrOXYzine (ATARAX/VISTARIL) 25 MG tablet Take 1 tablet (25 mg total) by mouth every 6 (six) hours as needed (anxiety/agitation or CIWA < or = 10). 10/27/19   Charm Rings, NP  ibuprofen (ADVIL) 600 MG tablet Take 1 tablet (600 mg total) by mouth every 6 (six) hours as needed for moderate pain. 10/27/19   Charm Rings, NP     Allergies Penicillins   No family history on file.  Social History Social History   Tobacco Use  . Smoking status: Current Every Day Smoker    Types: Cigarettes  . Smokeless tobacco: Never Used  . Tobacco comment: down to 2 cigarettes a day  Vaping Use  . Vaping Use: Never used  Substance Use Topics  . 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 Drug use: Not Currently    Review of Systems  Constitutional:   No fever or chills.  ENT:   No sore throat.  No rhinorrhea. Cardiovascular:   No chest pain or syncope. Respiratory:   No dyspnea or cough. Gastrointestinal:   Negative for abdominal pain, vomiting and diarrhea.  Musculoskeletal:   Negative for focal pain or swelling All other systems reviewed and are negative except as documented above in ROS and HPI.  ____________________________________________   PHYSICAL EXAM:  VITAL SIGNS: ED Triage Vitals  Enc Vitals Group     BP 11/01/19 2155 116/76     Pulse Rate 11/01/19 2155 88     Resp 11/01/19 2155 20     Temp 11/01/19 2155 98.3 F (36.8 C)     Temp Source 11/01/19 2155 Oral     SpO2 11/01/19 2155 95 %     Weight 11/01/19 2150 176 lb (79.8 kg)     Height 11/01/19 2150  5\' 9"  (1.753 m)     Head Circumference --      Peak Flow --      Pain Score 11/01/19 2150 8     Pain Loc --      Pain Edu? --      Excl. in GC? --     Vital signs reviewed, nursing assessments reviewed.   Constitutional:   Alert and oriented. Non-toxic appearance. Eyes:   Conjunctivae are normal. EOMI. PERRL. ENT      Head:   Normocephalic and atraumatic.      Nose:   Wearing a mask.      Mouth/Throat:   Wearing a mask.      Neck:   No meningismus. Full ROM. No c-spine tenderness. Hematological/Lymphatic/Immunilogical:   No cervical lymphadenopathy. Cardiovascular:   RRR. Symmetric bilateral radial and DP pulses.  No murmurs. Cap refill less than 2 seconds. Respiratory:   Normal respiratory effort without tachypnea/retractions. Breath sounds are clear and equal bilaterally. No wheezes/rales/rhonchi. Gastrointestinal:   Soft and nontender. Non distended. There is no CVA tenderness.  No rebound, rigidity, or guarding. Musculoskeletal:   Normal range of motion in all extremities. No joint effusions.  No lower extremity tenderness.  No edema. Neurologic:   Normal speech and language.  Motor grossly intact. No acute focal neurologic deficits are appreciated.  Skin:    Skin is warm, dry and intact. No rash noted.  No petechiae, purpura, or bullae. Old healed scar over the left radial wrist.   ____________________________________________    LABS (pertinent positives/negatives) (all labs ordered are listed, but only abnormal results are displayed) Labs Reviewed  COMPREHENSIVE METABOLIC PANEL - Abnormal; Notable for the following components:      Result Value   Potassium 3.2 (*)    All other components within normal limits  ETHANOL - Abnormal; Notable for the following components:   Alcohol, Ethyl (B) 131 (*)    All other components within normal limits  SALICYLATE LEVEL - Abnormal; Notable for the following components:   Salicylate Lvl <7.0 (*)    All other components within  normal limits  ACETAMINOPHEN LEVEL - Abnormal; Notable for the following components:   Acetaminophen (Tylenol), Serum <10 (*)    All other components within normal limits  SARS CORONAVIRUS 2 BY RT PCR (HOSPITAL ORDER, PERFORMED IN [facility])  CBC  URINE DRUG SCREEN, QUALITATIVE ([facility] ONLY)   ____________________________________________   EKG    ____________________________________________    RADIOLOGY  CT Head Wo Contrast  Result Date: 11/02/2019 CLINICAL DATA:  Fall, hit head EXAM: CT HEAD WITHOUT CONTRAST TECHNIQUE: Contiguous axial images were obtained from the base of the skull  through the vertex without intravenous contrast. COMPARISON:  05/04/2019 FINDINGS: Brain: No acute intracranial abnormality. Specifically, no hemorrhage, hydrocephalus, mass lesion, acute infarction, or significant intracranial injury. Vascular: No hyperdense vessel or unexpected calcification. Skull: No acute calvarial abnormality. Sinuses/Orbits: Visualized paranasal sinuses and mastoids clear. Orbital soft tissues unremarkable. Other: None IMPRESSION: Normal study. Electronically Signed   By: Charlett Nose M.D.   On: 11/02/2019 00:44    ____________________________________________   PROCEDURES Procedures  ____________________________________________    CLINICAL IMPRESSION / ASSESSMENT AND PLAN / ED COURSE  Medications ordered in the ED: Medications - No data to display  Pertinent labs & imaging results that were available during my care of the patient were reviewed by me and considered in my medical decision making (see chart for details).  JAION LAGRANGE was evaluated in Emergency Department on 11/02/2019 for the symptoms described in the history of present illness. He was evaluated in the context of the global COVID-19 pandemic, which necessitated consideration that the patient might be at risk for infection with the SARS-CoV-2 virus that causes COVID-19. Institutional protocols  and algorithms that pertain to the evaluation of patients at risk for COVID-19 are in a state of rapid change based on information released by regulatory bodies including the CDC and federal and state organizations. These policies and algorithms were followed during the patient's care in the ED.   Pt p/w SI with plan. High risk due to sex, alcohol abuse, prior attempt. Will consult psychiatry for eval.   The patient has been placed in psychiatric observation due to the need to provide a safe environment for the patient while obtaining psychiatric consultation and evaluation, as well as ongoing medical and medication management to treat the patient's condition.  The patient has not been placed under full IVC at this time.       ____________________________________________   FINAL CLINICAL IMPRESSION(S) / ED DIAGNOSES    Final diagnoses:  Alcohol abuse  Suicidal thoughts     ED Discharge Orders    None      Portions of this note were generated with dragon dictation software. Dictation errors may occur despite best attempts at proofreading.   Sharman Cheek, MD 11/02/19 [id number]    Sharman Cheek, MD 11/02/19 [phone]

## 2019-11-02 NOTE — ED Notes (Signed)
Pt states that he fell while he was drinking and has been having suicidal thoughts. Pt states that he does have a plan but does not elaborate. Pt calm, cooperative, and pleasant at this time. Pt requesting a breakfast tray.

## 2019-11-02 NOTE — ED Notes (Signed)
Dr Smith Robert and TTS at bedside

## 2020-09-30 ENCOUNTER — Other Ambulatory Visit: Payer: Self-pay

## 2020-09-30 ENCOUNTER — Encounter: Payer: Self-pay | Admitting: Emergency Medicine

## 2020-09-30 ENCOUNTER — Emergency Department
Admission: EM | Admit: 2020-09-30 | Discharge: 2020-09-30 | Disposition: A | Discharge status: Home / Self Care | Payer: Medicaid Other | Attending: Student in an Organized Health Care Education/Training Program | Admitting: Student in an Organized Health Care Education/Training Program

## 2020-09-30 ENCOUNTER — Emergency Department: Payer: Medicaid Other

## 2020-09-30 DIAGNOSIS — Y30XXXA Falling, jumping or pushed from a high place, undetermined intent, initial encounter: Secondary | ICD-10-CM | POA: Insufficient documentation

## 2020-09-30 DIAGNOSIS — M25552 Pain in left hip: Secondary | ICD-10-CM | POA: Diagnosis present

## 2020-09-30 DIAGNOSIS — Z20822 Contact with and (suspected) exposure to covid-19: Secondary | ICD-10-CM | POA: Diagnosis not present

## 2020-09-30 DIAGNOSIS — I10 Essential (primary) hypertension: Secondary | ICD-10-CM | POA: Diagnosis not present

## 2020-09-30 DIAGNOSIS — R45851 Suicidal ideations: Secondary | ICD-10-CM | POA: Insufficient documentation

## 2020-09-30 DIAGNOSIS — F17210 Nicotine dependence, cigarettes, uncomplicated: Secondary | ICD-10-CM | POA: Insufficient documentation

## 2020-09-30 DIAGNOSIS — Y9339 Activity, other involving climbing, rappelling and jumping off: Secondary | ICD-10-CM | POA: Insufficient documentation

## 2020-09-30 DIAGNOSIS — Y92410 Unspecified street and highway as the place of occurrence of the external cause: Secondary | ICD-10-CM | POA: Insufficient documentation

## 2020-09-30 DIAGNOSIS — R0789 Other chest pain: Secondary | ICD-10-CM | POA: Insufficient documentation

## 2020-09-30 LAB — COMPREHENSIVE METABOLIC PANEL
ALT: 18 U/L (ref 0–44)
AST: 31 U/L (ref 15–41)
Albumin: 4 g/dL (ref 3.5–5.0)
Alkaline Phosphatase: 75 U/L (ref 38–126)
Anion gap: 9 (ref 5–15)
BUN: 7 mg/dL (ref 6–20)
CO2: 27 mmol/L (ref 22–32)
Calcium: 9 mg/dL (ref 8.9–10.3)
Chloride: 102 mmol/L (ref 98–111)
Creatinine, Ser: 0.72 mg/dL (ref 0.61–1.24)
GFR, Estimated: >60 mL/min (ref >60)
Glucose, Bld: 93 mg/dL (ref 70–99)
Potassium: 3.6 mmol/L (ref 3.5–5.1)
Sodium: 138 mmol/L (ref 135–145)
Total Bilirubin: 1.3 mg/dL — ABNORMAL HIGH (ref 0.3–1.2)
Total Protein: 7.4 g/dL (ref 6.5–8.1)

## 2020-09-30 LAB — RESP PANEL BY RT-PCR (FLU A&B, COVID) ARPGX2
Influenza A by PCR: NEGATIVE
Influenza B by PCR: NEGATIVE
SARS Coronavirus 2 by RT PCR: NEGATIVE

## 2020-09-30 LAB — URINE DRUG SCREEN, QUALITATIVE (ARMC ONLY)
Amphetamines, Ur Screen: NOT DETECTED
Barbiturates, Ur Screen: NOT DETECTED
Benzodiazepine, Ur Scrn: NOT DETECTED
Cannabinoid 50 Ng, Ur ~~LOC~~: NOT DETECTED
Cocaine Metabolite,Ur ~~LOC~~: NOT DETECTED
MDMA (Ecstasy)Ur Screen: NOT DETECTED
Methadone Scn, Ur: NOT DETECTED
Opiate, Ur Screen: NOT DETECTED
Phencyclidine (PCP) Ur S: NOT DETECTED
Tricyclic, Ur Screen: NOT DETECTED

## 2020-09-30 LAB — CBC
HCT: 46.5 % (ref 39.0–52.0)
Hemoglobin: 16.3 g/dL (ref 13.0–17.0)
MCH: 31.4 pg (ref 26.0–34.0)
MCHC: 35.1 g/dL (ref 30.0–36.0)
MCV: 89.6 fL (ref 80.0–100.0)
Platelets: 255 10*3/uL (ref 150–400)
RBC: 5.19 MIL/uL (ref 4.22–5.81)
RDW: 13.9 % (ref 11.5–15.5)
WBC: 10.6 10*3/uL — ABNORMAL HIGH (ref 4.0–10.5)
nRBC: 0 % (ref 0.0–0.2)

## 2020-09-30 LAB — SALICYLATE LEVEL: Salicylate Lvl: <7 mg/dL — ABNORMAL LOW (ref 7.0–30.0)

## 2020-09-30 LAB — ACETAMINOPHEN LEVEL: Acetaminophen (Tylenol), Serum: <10 ug/mL — ABNORMAL LOW (ref 10–30)

## 2020-09-30 LAB — ETHANOL: Alcohol, Ethyl (B): <10 mg/dL (ref <10)

## 2020-09-30 MED ORDER — IBUPROFEN 600 MG PO TABS
600.0000 mg | ORAL_TABLET | Freq: Once | ORAL | Status: AC
  Administered 2020-09-30: 600 mg via ORAL
  Filled 2020-09-30: qty 1

## 2020-09-30 NOTE — ED Notes (Addendum)
Pt belongings includes Blue bag  Black tshirt Blue jeans [employer] belt [employer] given to security front desk.

## 2020-09-30 NOTE — ED Triage Notes (Signed)
Pt also reports intermittent thoughts of self harm and reports being homeless on the street is hard
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
## 2020-10-02 ENCOUNTER — Emergency Department: Payer: Medicaid Other

## 2020-10-02 ENCOUNTER — Other Ambulatory Visit: Payer: Self-pay

## 2020-10-02 ENCOUNTER — Encounter: Payer: Self-pay | Admitting: *Deleted

## 2020-10-02 ENCOUNTER — Emergency Department
Admission: EM | Admit: 2020-10-02 | Discharge: 2020-10-02 | Disposition: A | Discharge status: Home / Self Care | Payer: Medicaid Other | Attending: Emergency Medicine | Admitting: Emergency Medicine

## 2020-10-02 DIAGNOSIS — Y92481 Parking lot as the place of occurrence of the external cause: Secondary | ICD-10-CM | POA: Insufficient documentation

## 2020-10-02 DIAGNOSIS — W19XXXA Unspecified fall, initial encounter: Secondary | ICD-10-CM | POA: Diagnosis not present

## 2020-10-02 DIAGNOSIS — M545 Low back pain, unspecified: Secondary | ICD-10-CM | POA: Diagnosis not present

## 2020-10-02 DIAGNOSIS — R079 Chest pain, unspecified: Secondary | ICD-10-CM | POA: Diagnosis present

## 2020-10-02 DIAGNOSIS — M25552 Pain in left hip: Secondary | ICD-10-CM | POA: Insufficient documentation

## 2020-10-02 DIAGNOSIS — Z5321 Procedure and treatment not carried out due to patient leaving prior to being seen by health care provider: Secondary | ICD-10-CM | POA: Diagnosis not present

## 2020-10-02 DIAGNOSIS — F17200 Nicotine dependence, unspecified, uncomplicated: Secondary | ICD-10-CM | POA: Diagnosis not present

## 2020-10-02 LAB — BASIC METABOLIC PANEL
Anion gap: 7 (ref 5–15)
BUN: 7 mg/dL (ref 6–20)
CO2: 28 mmol/L (ref 22–32)
Calcium: 8.5 mg/dL — ABNORMAL LOW (ref 8.9–10.3)
Chloride: 106 mmol/L (ref 98–111)
Creatinine, Ser: 0.98 mg/dL (ref 0.61–1.24)
GFR, Estimated: >60 mL/min (ref >60)
Glucose, Bld: 103 mg/dL — ABNORMAL HIGH (ref 70–99)
Potassium: 3.8 mmol/L (ref 3.5–5.1)
Sodium: 141 mmol/L (ref 135–145)

## 2020-10-02 LAB — CBC
HCT: 46.6 % (ref 39.0–52.0)
Hemoglobin: 16 g/dL (ref 13.0–17.0)
MCH: 31 pg (ref 26.0–34.0)
MCHC: 34.3 g/dL (ref 30.0–36.0)
MCV: 90.3 fL (ref 80.0–100.0)
Platelets: 225 10*3/uL (ref 150–400)
RBC: 5.16 MIL/uL (ref 4.22–5.81)
RDW: 14.6 % (ref 11.5–15.5)
WBC: 7.7 10*3/uL (ref 4.0–10.5)
nRBC: 0 % (ref 0.0–0.2)

## 2020-10-02 LAB — TROPONIN I (HIGH SENSITIVITY): Troponin I (High Sensitivity): 4 ng/L (ref <18)
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
## 2020-10-02 NOTE — ED Triage Notes (Signed)
Pt reports central chest pain that started "last week". Pt also reported he fell this morning and hurt his left hip more and having some lower back pain. Last ETOH reported as "I had one this morning"

## 2020-10-02 NOTE — ED Notes (Addendum)
Pt called multiple times by Xray no answer. Pt not outside.

## 2020-10-06 ENCOUNTER — Emergency Department: Payer: Medicaid Other

## 2020-10-06 ENCOUNTER — Other Ambulatory Visit: Payer: Self-pay

## 2020-10-06 ENCOUNTER — Emergency Department
Admission: EM | Admit: 2020-10-06 | Discharge: 2020-10-07 | Disposition: A | Discharge status: Home / Self Care | Payer: Medicaid Other | Attending: Emergency Medicine | Admitting: Emergency Medicine

## 2020-10-06 DIAGNOSIS — R45851 Suicidal ideations: Secondary | ICD-10-CM | POA: Insufficient documentation

## 2020-10-06 DIAGNOSIS — F1994 Other psychoactive substance use, unspecified with psychoactive substance-induced mood disorder: Secondary | ICD-10-CM | POA: Diagnosis present

## 2020-10-06 DIAGNOSIS — M545 Low back pain, unspecified: Secondary | ICD-10-CM | POA: Diagnosis not present

## 2020-10-06 DIAGNOSIS — F1010 Alcohol abuse, uncomplicated: Secondary | ICD-10-CM | POA: Diagnosis present

## 2020-10-06 DIAGNOSIS — F1924 Other psychoactive substance dependence with psychoactive substance-induced mood disorder: Secondary | ICD-10-CM | POA: Insufficient documentation

## 2020-10-06 DIAGNOSIS — I10 Essential (primary) hypertension: Secondary | ICD-10-CM | POA: Insufficient documentation

## 2020-10-06 DIAGNOSIS — Y908 Blood alcohol level of 240 mg/100 ml or more: Secondary | ICD-10-CM | POA: Diagnosis not present

## 2020-10-06 DIAGNOSIS — F1020 Alcohol dependence, uncomplicated: Secondary | ICD-10-CM | POA: Diagnosis not present

## 2020-10-06 DIAGNOSIS — Z765 Malingerer [conscious simulation]: Secondary | ICD-10-CM

## 2020-10-06 DIAGNOSIS — F17210 Nicotine dependence, cigarettes, uncomplicated: Secondary | ICD-10-CM | POA: Insufficient documentation

## 2020-10-06 DIAGNOSIS — F1099 Alcohol use, unspecified with unspecified alcohol-induced disorder: Secondary | ICD-10-CM | POA: Diagnosis present

## 2020-10-06 DIAGNOSIS — R4781 Slurred speech: Secondary | ICD-10-CM | POA: Diagnosis not present

## 2020-10-06 DIAGNOSIS — Z20822 Contact with and (suspected) exposure to covid-19: Secondary | ICD-10-CM | POA: Diagnosis not present

## 2020-10-06 DIAGNOSIS — M549 Dorsalgia, unspecified: Secondary | ICD-10-CM

## 2020-10-06 DIAGNOSIS — Z5900 Homelessness unspecified: Secondary | ICD-10-CM

## 2020-10-06 LAB — URINALYSIS, COMPLETE (UACMP) WITH MICROSCOPIC
Bacteria, UA: NONE SEEN
Bilirubin Urine: NEGATIVE
Glucose, UA: NEGATIVE mg/dL
Hgb urine dipstick: NEGATIVE
Ketones, ur: NEGATIVE mg/dL
Leukocytes,Ua: NEGATIVE
Nitrite: NEGATIVE
Protein, ur: NEGATIVE mg/dL
Specific Gravity, Urine: 1.001 — ABNORMAL LOW (ref 1.005–1.030)
Squamous Epithelial / HPF: NONE SEEN (ref 0–5)
pH: 6 (ref 5.0–8.0)

## 2020-10-06 LAB — URINE DRUG SCREEN, QUALITATIVE (ARMC ONLY)
Amphetamines, Ur Screen: NOT DETECTED
Barbiturates, Ur Screen: NOT DETECTED
Benzodiazepine, Ur Scrn: NOT DETECTED
Cannabinoid 50 Ng, Ur ~~LOC~~: NOT DETECTED
Cocaine Metabolite,Ur ~~LOC~~: NOT DETECTED
MDMA (Ecstasy)Ur Screen: NOT DETECTED
Methadone Scn, Ur: NOT DETECTED
Opiate, Ur Screen: NOT DETECTED
Phencyclidine (PCP) Ur S: NOT DETECTED
Tricyclic, Ur Screen: NOT DETECTED

## 2020-10-06 LAB — CBC WITH DIFFERENTIAL/PLATELET
Abs Immature Granulocytes: 0.03 10*3/uL (ref 0.00–0.07)
Basophils Absolute: 0.1 10*3/uL (ref 0.0–0.1)
Basophils Relative: 1 %
Eosinophils Absolute: 0.1 10*3/uL (ref 0.0–0.5)
Eosinophils Relative: 1 %
HCT: 49.5 % (ref 39.0–52.0)
Hemoglobin: 16.9 g/dL (ref 13.0–17.0)
Immature Granulocytes: 0 %
Lymphocytes Relative: 37 %
Lymphs Abs: 3.1 10*3/uL (ref 0.7–4.0)
MCH: 30.7 pg (ref 26.0–34.0)
MCHC: 34.1 g/dL (ref 30.0–36.0)
MCV: 90 fL (ref 80.0–100.0)
Monocytes Absolute: 0.6 10*3/uL (ref 0.1–1.0)
Monocytes Relative: 7 %
Neutro Abs: 4.6 10*3/uL (ref 1.7–7.7)
Neutrophils Relative %: 54 %
Platelets: 225 10*3/uL (ref 150–400)
RBC: 5.5 MIL/uL (ref 4.22–5.81)
RDW: 14.8 % (ref 11.5–15.5)
WBC: 8.5 10*3/uL (ref 4.0–10.5)
nRBC: 0 % (ref 0.0–0.2)

## 2020-10-06 LAB — COMPREHENSIVE METABOLIC PANEL
ALT: 23 U/L (ref 0–44)
AST: 33 U/L (ref 15–41)
Albumin: 3.7 g/dL (ref 3.5–5.0)
Alkaline Phosphatase: 71 U/L (ref 38–126)
Anion gap: 9 (ref 5–15)
BUN: 6 mg/dL (ref 6–20)
CO2: 30 mmol/L (ref 22–32)
Calcium: 8.4 mg/dL — ABNORMAL LOW (ref 8.9–10.3)
Chloride: 105 mmol/L (ref 98–111)
Creatinine, Ser: 0.75 mg/dL (ref 0.61–1.24)
GFR, Estimated: >60 mL/min (ref >60)
Glucose, Bld: 95 mg/dL (ref 70–99)
Potassium: 3.4 mmol/L — ABNORMAL LOW (ref 3.5–5.1)
Sodium: 144 mmol/L (ref 135–145)
Total Bilirubin: 0.7 mg/dL (ref 0.3–1.2)
Total Protein: 6.9 g/dL (ref 6.5–8.1)

## 2020-10-06 LAB — RESP PANEL BY RT-PCR (FLU A&B, COVID) ARPGX2
Influenza A by PCR: NEGATIVE
Influenza B by PCR: NEGATIVE
SARS Coronavirus 2 by RT PCR: NEGATIVE

## 2020-10-06 LAB — LIPASE, BLOOD: Lipase: 51 U/L (ref 11–51)

## 2020-10-06 LAB — MAGNESIUM: Magnesium: 2.2 mg/dL (ref 1.7–2.4)

## 2020-10-06 LAB — SALICYLATE LEVEL: Salicylate Lvl: <7 mg/dL — ABNORMAL LOW (ref 7.0–30.0)

## 2020-10-06 LAB — ETHANOL: Alcohol, Ethyl (B): 319 mg/dL (ref <10)

## 2020-10-06 LAB — ACETAMINOPHEN LEVEL: Acetaminophen (Tylenol), Serum: <10 ug/mL — ABNORMAL LOW (ref 10–30)

## 2020-10-06 MED ORDER — LORAZEPAM 2 MG/ML IJ SOLN
0.0000 mg | Freq: Two times a day (BID) | INTRAMUSCULAR | Status: DC
Start: 2020-10-09 — End: 2020-10-07

## 2020-10-06 MED ORDER — LACTATED RINGERS IV BOLUS
1000.0000 mL | Freq: Once | INTRAVENOUS | Status: AC
  Administered 2020-10-06: 1000 mL via INTRAVENOUS

## 2020-10-06 MED ORDER — IBUPROFEN 600 MG PO TABS: 600.0000 mg | ORAL_TABLET | Freq: Four times a day (QID) | ORAL | Status: DC | PRN

## 2020-10-06 MED ORDER — MELATONIN 5 MG PO TABS
10.0000 mg | ORAL_TABLET | Freq: Every day | ORAL | Status: DC
  Administered 2020-10-06: 10 mg via ORAL
  Filled 2020-10-06: qty 2

## 2020-10-06 MED ORDER — LORAZEPAM 2 MG PO TABS: 0.0000 mg | ORAL_TABLET | Freq: Four times a day (QID) | ORAL | Status: DC

## 2020-10-06 MED ORDER — LORAZEPAM 2 MG PO TABS: 0.0000 mg | ORAL_TABLET | Freq: Two times a day (BID) | ORAL | Status: DC

## 2020-10-06 MED ORDER — THIAMINE HCL 100 MG/ML IJ SOLN: 100.0000 mg | Freq: Every day | INTRAMUSCULAR | Status: DC

## 2020-10-06 MED ORDER — LORAZEPAM 2 MG/ML IJ SOLN: 0.0000 mg | Freq: Four times a day (QID) | INTRAMUSCULAR | Status: DC

## 2020-10-06 MED ORDER — THIAMINE HCL 100 MG PO TABS
100.0000 mg | ORAL_TABLET | Freq: Every day | ORAL | Status: DC
  Administered 2020-10-06: 100 mg via ORAL
  Filled 2020-10-06: qty 1

## 2020-10-06 NOTE — ED Triage Notes (Addendum)
Pt states that he is homeless and has some back pain. Pt admits to having "three beers". Slurring speech and making advances towards staff.

## 2020-10-06 NOTE — ED Notes (Signed)
Patient appears intoxicated and making inappropriate sexual statements to staff. C/o back pain. Will not give an answer to questions asked. Patient repeatedly asking for food
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
## 2020-10-06 NOTE — ED Notes (Signed)
Patient resting comfortably in bed. IV fluids running per order. Snacks have been given per request. Patient alert and oriented X 4. Denies pain, SI/HI and AVH. Calm and cooperative. Will continue to monitor for the remainder of the shift.

## 2020-10-06 NOTE — ED Notes (Signed)
MD Katrinka Blazing made aware of ETOH 319 result called to this RN by lab
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
## 2020-10-06 NOTE — ED Notes (Signed)
Pt was asked by this tech and April from security if pt had any weapons on him at this time. Pt said no initially, then stated he did have a gun on him. Pt laughed and said he was only joking.

## 2020-10-06 NOTE — ED Notes (Signed)
Pt belongings checked by this tech and April from security. Backback had clothes, toiletry's including shaving razors, toothbrush, electric shaver, phone, charging cable, two drinks, head of a watch, and 2x tiger figurines. No weapons found in backpack. All items replaced in bag.

## 2020-10-07 DIAGNOSIS — Z765 Malingerer [conscious simulation]: Secondary | ICD-10-CM

## 2020-10-07 NOTE — Consult Note (Signed)
[facility] Face-to-Face Psychiatry Consult   Reason for Consult:  Psychiatric Evaluation Referring Physician:  Dr. Vicente Males Patient Identification: John [name] MRN:  [id number] Principal Diagnosis: Malingerer Diagnosis:  Principal Problem:   Malingerer Active Problems:   Alcohol abuse   Substance induced mood disorder (HCC)   Total Time spent with patient: 1 hour  Subjective:   John [name] is a 40 y.o. male patient admitted with Pt states that he is homeless and has some back pain. Pt admits to having "three beers". Slurring speech and making advances towards staff.  HPI:  John [name], 40 y.o., male patient presented to [facility].  Patient seen  by TTS and this provider; chart reviewed and consulted with Dr. Vicente Males on 10/07/20.  On evaluation John [name] reports chronic suicidally. When asked if patient was suicidal or homicidal, patient replies "yup". Patient ongoing endorsement of suicidal ideation shows clear evidence of secondary gain of unmet needs for housing, that is representative of limited and often- maladaptive coping skills and rather than an indicator of imminent risk of death.  Evidence indicates that subsequent suicide attempts by patients who made contingent suicide threats (defined as threatened suicide or exaggerated suicidality) are uncommon in both groups.  Hospitalization should not be used as a substitute for social services, substance abuse treatment, and legal assistance for patients who make contingent suicide threats (Characteristics and six-month outcome of patients who use suicide threats to seek hospital admission.    Per TTS, pt presented with an irritable mood and a labile affect. Pt was guarded and vague with responses to assessment questions.  Pt's speech is slurred, but intelligible. Pt presented with goal-oriented. Pt did not appear to be responding to internal/external stimuli. Pt was expressed thoughts of SI with a plan to walk into moving traffic. Pt  explained that he'd presented to the hospital due to ETOH, secondary to worsening depression. Pt explained that his family relationships are strained and that he does not have support system. Pt explained that he is recently homeless since graduating from the [address] [employer] [place]. Pt explained that he lost his bed due to having to return to [facility] for court. Pt reported issues with sleep; but denied appetite disturbance. The patient denied current HI or AV/H.   Inpatient hospitalization is not indicated.   TTS is providing patient outpatient resources for substance abuse treatment.   Recommendations:  Patient is psychiatrically cleared. Discharge when medically cleared  Past Psychiatric History: Substance abuse  Risk to Self:   Risk to Others:   Prior Inpatient Therapy:   Prior Outpatient Therapy:    Past Medical History:  Past Medical History:  Diagnosis Date   ASD (atrial septal defect)    Bacterial infection due to H. pylori    Hypertension    Kidney stones     Past Surgical History:  Procedure Laterality Date   ASD REPAIR     FOOT SURGERY Right    KNEE SURGERY Right    Family History: History reviewed. No pertinent family history. Family Psychiatric  History: unknown Social History:  Social History   Substance and Sexual Activity  Alcohol Use Yes   Comment: Last consumed Friday 06/10/19, pt states he is "an alcoholic" and "wants to drink"     Social History   Substance and Sexual Activity  Drug Use Not Currently    Social History   Socioeconomic History   Marital status: Single    Spouse name: Not on file  Number of children: Not on file   Years of education: Not on file   Highest education level: Not on file  Occupational History   Not on file  Tobacco Use   Smoking status: Every Day    Types: Cigarettes   Smokeless tobacco: Never   Tobacco comments:    down to 2 cigarettes a day  Vaping Use   Vaping Use: Never used  Substance and Sexual  Activity   Alcohol use: Yes    Comment: Last consumed Friday 06/10/19, pt states he is "an alcoholic" and "wants to drink"   Drug use: Not Currently   Sexual activity: Yes    Birth control/protection: None  Other Topics Concern   Not on file  Social History Narrative   Not on file   Social Determinants of Health   Financial Resource Strain: Not on file  Food Insecurity: Not on file  Transportation Needs: Not on file  Physical Activity: Not on file  Stress: Not on file  Social Connections: Not on file   Additional Social History:    Allergies:   Allergies  Allergen Reactions   Penicillins     Did it involve swelling of the face/tongue/throat, SOB, or low BP? No Did it involve sudden or severe rash/hives, skin peeling, or any reaction on the inside of your mouth or nose? No Did you need to seek medical attention at a hospital or doctor's office? Unknown When did it last happen?       If all above answers are "NO", may proceed with cephalosporin use.    Labs:  Results for orders placed or performed during the hospital encounter of 10/06/20 (from the past 48 hour(s))  Urinalysis, Complete w Microscopic Urine, Clean Catch     Status: Abnormal   Collection Time: 10/06/20  4:56 PM  Result Value Ref Range   Color, Urine COLORLESS (A) YELLOW   APPearance CLEAR (A) CLEAR   Specific Gravity, Urine 1.001 (L) 1.005 - 1.030   pH 6.0 5.0 - 8.0   Glucose, UA NEGATIVE NEGATIVE mg/dL   Hgb urine dipstick NEGATIVE NEGATIVE   Bilirubin Urine NEGATIVE NEGATIVE   Ketones, ur NEGATIVE NEGATIVE mg/dL   Protein, ur NEGATIVE NEGATIVE mg/dL   Nitrite NEGATIVE NEGATIVE   Leukocytes,Ua NEGATIVE NEGATIVE   RBC / HPF 0-5 0 - 5 RBC/hpf   WBC, UA 0-5 0 - 5 WBC/hpf   Bacteria, UA NONE SEEN NONE SEEN   Squamous Epithelial / LPF NONE SEEN 0 - 5   Mucus PRESENT     Comment: Performed at [facility], [address] [place], [place] [address]  Urine Drug Screen, Qualitative ([facility] only)      Status: None   Collection Time: 10/06/20  4:56 PM  Result Value Ref Range   Tricyclic, Ur Screen NONE DETECTED NONE DETECTED   Amphetamines, Ur Screen NONE DETECTED NONE DETECTED   MDMA (Ecstasy)Ur Screen NONE DETECTED NONE DETECTED   Cocaine Metabolite,Ur [place] NONE DETECTED NONE DETECTED   Opiate, Ur Screen NONE DETECTED NONE DETECTED   Phencyclidine (PCP) Ur S NONE DETECTED NONE DETECTED   Cannabinoid 50 Ng, Ur [place] NONE DETECTED NONE DETECTED   Barbiturates, Ur Screen NONE DETECTED NONE DETECTED   Benzodiazepine, Ur Scrn NONE DETECTED NONE DETECTED   Methadone Scn, Ur NONE DETECTED NONE DETECTED    Comment: (NOTE) Tricyclics + metabolites, urine    Cutoff 1000 ng/mL Amphetamines + metabolites, urine  Cutoff 1000 ng/mL MDMA (Ecstasy), urine  Cutoff 500 ng/mL Cocaine Metabolite, urine          Cutoff 300 ng/mL Opiate + metabolites, urine        Cutoff 300 ng/mL Phencyclidine (PCP), urine         Cutoff 25 ng/mL Cannabinoid, urine                 Cutoff 50 ng/mL Barbiturates + metabolites, urine  Cutoff 200 ng/mL Benzodiazepine, urine              Cutoff 200 ng/mL Methadone, urine                   Cutoff 300 ng/mL  The urine drug screen provides only a preliminary, unconfirmed analytical test result and should not be used for non-medical purposes. Clinical consideration and professional judgment should be applied to any positive drug screen result due to possible interfering substances. A more specific alternate chemical method must be used in order to obtain a confirmed analytical result. Gas chromatography / mass spectrometry (GC/MS) is the preferred confirm atory method. Performed at [facility], [address] [place], [place] [address]   CBC with Differential     Status: None   Collection Time: 10/06/20  5:46 PM  Result Value Ref Range   WBC 8.5 4.0 - 10.5 K/uL   RBC 5.50 4.22 - 5.81 MIL/uL   Hemoglobin 16.9 13.0 - 17.0 g/dL   HCT 52.849.5 41.339.0 - 24.452.0  %   MCV 90.0 80.0 - 100.0 fL   MCH 30.7 26.0 - 34.0 pg   MCHC 34.1 30.0 - 36.0 g/dL   RDW 01.014.8 27.211.5 - 53.615.5 %   Platelets 225 150 - 400 K/uL   nRBC 0.0 0.0 - 0.2 %   Neutrophils Relative % 54 %   Neutro Abs 4.6 1.7 - 7.7 K/uL   Lymphocytes Relative 37 %   Lymphs Abs 3.1 0.7 - 4.0 K/uL   Monocytes Relative 7 %   Monocytes Absolute 0.6 0.1 - 1.0 K/uL   Eosinophils Relative 1 %   Eosinophils Absolute 0.1 0.0 - 0.5 K/uL   Basophils Relative 1 %   Basophils Absolute 0.1 0.0 - 0.1 K/uL   Immature Granulocytes 0 %   Abs Immature Granulocytes 0.03 0.00 - 0.07 K/uL    Comment: Performed at [facility], [address] [place], [place] [address]  Comprehensive metabolic panel     Status: Abnormal   Collection Time: 10/06/20  5:46 PM  Result Value Ref Range   Sodium 144 135 - 145 mmol/L   Potassium 3.4 (L) 3.5 - 5.1 mmol/L   Chloride 105 98 - 111 mmol/L   CO2 30 22 - 32 mmol/L   Glucose, Bld 95 70 - 99 mg/dL    Comment: Glucose reference range applies only to samples taken after fasting for at least 8 hours.   BUN 6 6 - 20 mg/dL   Creatinine, Ser 4.740.75 0.61 - 1.24 mg/dL   Calcium 8.4 (L) 8.9 - 10.3 mg/dL   Total Protein 6.9 6.5 - 8.1 g/dL   Albumin 3.7 3.5 - 5.0 g/dL   AST 33 15 - 41 U/L   ALT 23 0 - 44 U/L   Alkaline Phosphatase 71 38 - 126 U/L   Total Bilirubin 0.7 0.3 - 1.2 mg/dL   GFR, Estimated >25>60 >95>60 mL/min    Comment: (NOTE) Calculated using the CKD-EPI Creatinine Equation (2021)    Anion gap 9 5 - 15  Comment: Performed at [facility], [address] [place], [place] [address]  Lipase, blood     Status: None   Collection Time: 10/06/20  5:46 PM  Result Value Ref Range   Lipase 51 11 - 51 U/L    Comment: Performed at [facility], [address] [place], [place] [address]  Ethanol     Status: Abnormal   Collection Time: 10/06/20  5:46 PM  Result Value Ref Range   Alcohol, Ethyl (B) 319 (HH) <10 mg/dL    Comment: CRITICAL RESULT  CALLED TO, READ BACK BY AND VERIFIED WITH AMBER PAYNE AT 1825 ON 10/06/20 BY SS (NOTE) Lowest detectable limit for serum alcohol is 10 mg/dL.  For medical purposes only. Performed at [facility], [address] [place], [place] [address]   Salicylate level     Status: Abnormal   Collection Time: 10/06/20  5:46 PM  Result Value Ref Range   Salicylate Lvl <7.0 (L) 7.0 - 30.0 mg/dL    Comment: Performed at [facility], [address] [place], [place] [address]  Acetaminophen level     Status: Abnormal   Collection Time: 10/06/20  5:46 PM  Result Value Ref Range   Acetaminophen (Tylenol), Serum <10 (L) 10 - 30 ug/mL    Comment: (NOTE) Therapeutic concentrations vary significantly. A range of 10-30 ug/mL  may be an effective concentration for many patients. However, some  are best treated at concentrations outside of this range. Acetaminophen concentrations >150 ug/mL at 4 hours after ingestion  and >50 ug/mL at 12 hours after ingestion are often associated with  toxic reactions.  Performed at [facility], [address] [place], [place] [address]   Magnesium     Status: None   Collection Time: 10/06/20  5:46 PM  Result Value Ref Range   Magnesium 2.2 1.7 - 2.4 mg/dL    Comment: Performed at [facility], [address] [place], [place] [address]  Resp Panel by RT-PCR (Flu A&B, Covid) Nasopharyngeal Swab     Status: None   Collection Time: 10/06/20  9:26 PM   Specimen: Nasopharyngeal Swab; Nasopharyngeal(NP) swabs in vial transport medium  Result Value Ref Range   SARS Coronavirus 2 by RT PCR NEGATIVE NEGATIVE    Comment: (NOTE) SARS-CoV-2 target nucleic acids are NOT DETECTED.  The SARS-CoV-2 RNA is generally detectable in upper respiratory specimens during the acute phase of infection. The lowest concentration of SARS-CoV-2 viral copies this assay can detect is 138 copies/mL. A negative result does not preclude  SARS-Cov-2 infection and should not be used as the sole basis for treatment or other patient management decisions. A negative result may occur with  improper specimen collection/handling, submission of specimen other than nasopharyngeal swab, presence of viral mutation(s) within the areas targeted by this assay, and inadequate number of viral copies(<138 copies/mL). A negative result must be combined with clinical observations, patient history, and epidemiological information. The expected result is Negative.  Fact Sheet for Patients:  BloggerCourse.com  Fact Sheet for Healthcare Providers:  SeriousBroker.it  This test is no t yet approved or cleared by the [place] FDA and  has been authorized for detection and/or diagnosis of SARS-CoV-2 by FDA under an Emergency Use Authorization (EUA). This EUA will remain  in effect (meaning this test can be used) for the duration of the COVID-19 declaration under Section 564(b)(1) of the Act, 21 U.S.C.section 360bbb-3(b)(1), unless the authorization is terminated  or revoked sooner.  Influenza A by PCR NEGATIVE NEGATIVE   Influenza B by PCR NEGATIVE NEGATIVE    Comment: (NOTE) The Xpert Xpress SARS-CoV-2/FLU/RSV plus assay is intended as an aid in the diagnosis of influenza from Nasopharyngeal swab specimens and should not be used as a sole basis for treatment. Nasal washings and aspirates are unacceptable for Xpert Xpress SARS-CoV-2/FLU/RSV testing.  Fact Sheet for Patients: BloggerCourse.com  Fact Sheet for Healthcare Providers: SeriousBroker.it  This test is not yet approved or cleared by the [place] FDA and has been authorized for detection and/or diagnosis of SARS-CoV-2 by FDA under an Emergency Use Authorization (EUA). This EUA will remain in effect (meaning this test can be used) for the duration of the COVID-19  declaration under Section 564(b)(1) of the Act, 21 U.S.C. section 360bbb-3(b)(1), unless the authorization is terminated or revoked.  Performed at [facility], [address] [place], [place] [address]     Current Facility-Administered Medications  Medication Dose Route Frequency Provider Last Rate Last Admin   ibuprofen (ADVIL) tablet 600 mg  600 mg Oral Q6H PRN Gilles Chiquito, MD       LORazepam (ATIVAN) injection 0-4 mg  0-4 mg Intravenous Q6H Gilles Chiquito, MD       Or   LORazepam (ATIVAN) tablet 0-4 mg  0-4 mg Oral Q6H Gilles Chiquito, MD       [START ON 10/09/2020] LORazepam (ATIVAN) injection 0-4 mg  0-4 mg Intravenous Q12H Gilles Chiquito, MD       Or   Melene Muller ON 10/09/2020] LORazepam (ATIVAN) tablet 0-4 mg  0-4 mg Oral Q12H Gilles Chiquito, MD       melatonin tablet 10 mg  10 mg Oral QHS Gilles Chiquito, MD   10 mg at 10/06/20 2135   thiamine tablet 100 mg  100 mg Oral Daily Gilles Chiquito, MD   100 mg at 10/06/20 2135   Or   thiamine (B-1) injection 100 mg  100 mg Intravenous Daily Gilles Chiquito, MD       Current Outpatient Medications  Medication Sig Dispense Refill   gabapentin (NEURONTIN) 300 MG capsule Take 1 capsule (300 mg total) by mouth 3 (three) times daily. (Patient not taking: Reported on 10/06/2020) 90 capsule 1   haloperidol (HALDOL) 5 MG tablet Take 1 tablet (5 mg total) by mouth 2 (two) times daily. (Patient not taking: Reported on 10/06/2020) 60 tablet 1   hydrOXYzine (ATARAX/VISTARIL) 25 MG tablet Take 1 tablet (25 mg total) by mouth every 6 (six) hours as needed (anxiety/agitation or CIWA < or = 10). (Patient not taking: Reported on 10/06/2020) 30 tablet 0   ibuprofen (ADVIL) 600 MG tablet Take 1 tablet (600 mg total) by mouth every 6 (six) hours as needed for moderate pain. (Patient not taking: Reported on 10/06/2020) 30 tablet 0   Melatonin 10 MG TABS Take 10 mg by mouth at bedtime. (Patient not taking: Reported on 10/06/2020)       Musculoskeletal: Strength & Muscle Tone: within normal limits Gait & Station: normal Patient leans: N/A  Psychiatric Specialty Exam:  Presentation  General Appearance: Appropriate for Environment  Eye Contact:Fair  Speech:Garbled  Speech Volume:Normal  Handedness:Left   Mood and Affect  Mood:Irritable  Affect:Congruent   Thought Process  Thought Processes:Coherent  Descriptions of Associations:Intact  Orientation:Full (Time, Place and Person)  Thought Content:Logical  History of Schizophrenia/Schizoaffective disorder:No  Duration of Psychotic Symptoms:No data recorded Hallucinations:Hallucinations: None  Ideas of Reference:None  Suicidal  Thoughts:Suicidal Thoughts: Yes, Passive  Homicidal Thoughts:Homicidal Thoughts: Yes, Passive HI Passive Intent and/or Plan: Without Intent   Sensorium  Memory:Immediate Fair  Judgment:Fair  Insight:Fair   Executive Functions  Concentration:Fair  Attention Span:Fair  Recall:Fair  Fund of Knowledge:Fair  Language:Fair   Psychomotor Activity  Psychomotor Activity: No data recorded  Assets  Assets:Social Support; Desire for Improvement   Sleep  Sleep:Sleep: Fair   Physical Exam: Physical Exam Vitals and nursing note reviewed.  HENT:     Head: Normocephalic and atraumatic.     Nose: Nose normal.     Mouth/Throat:     Mouth: Mucous membranes are dry.  Eyes:     Pupils: Pupils are equal, round, and reactive to light.  Pulmonary:     Effort: Pulmonary effort is normal.  Musculoskeletal:        General: Normal range of motion.     Cervical back: Normal range of motion.  Skin:    General: Skin is warm and dry.  Neurological:     General: No focal deficit present.     Mental Status: He is alert and oriented to person, place, and time.  Psychiatric:        Attention and Perception: Attention and perception normal.        Mood and Affect: Mood normal. Affect is labile.        Speech:  Speech normal.        Behavior: Behavior normal. Behavior is cooperative.        Thought Content: Thought content normal.        Cognition and Memory: Cognition normal.        Judgment: Judgment normal.   Review of Systems  Psychiatric/Behavioral:  Positive for substance abuse. Negative for hallucinations and memory loss. The patient has insomnia.   All other systems reviewed and are negative. Blood pressure 103/64, pulse 95, temperature 97.9 F (36.6 C), temperature source Oral, resp. rate 18, height  (1.778 m), weight 80 kg, SpO2 96 %. Body mass index is 25.31 kg/m.  Disposition: No evidence of imminent risk to self or others at present.   Patient does not meet criteria for psychiatric inpatient admission. Discussed crisis plan, support from social network, calling 911, coming to the Emergency Department, and calling Suicide Hotline.  Jearld Lesch, NP 10/07/2020 2:58 AM

## 2020-10-07 NOTE — ED Provider Notes (Addendum)
Emergency Medicine Observation Re-evaluation Note  John [name] is a 40 y.o. male, seen on rounds today.  Pt initially presented to the ED for complaints of Homeless and Back Pain Currently, the patient is resting comfortably.  Physical Exam  BP 103/64   Pulse 95   Temp 97.9 F (36.6 C) (Oral)   Resp 18   Ht 5\' 10"  (1.778 m)   Wt 80 kg   SpO2 96%   BMI 25.31 kg/m  Physical Exam Gen: No acute distress  Resp: Normal rise and fall of chest Neuro: Moving all four extremities Psych: Resting currently, calm and cooperative when awake    ED Course / MDM  EKG:   I have reviewed the labs performed to date as well as medications administered while in observation.  Recent changes in the last 24 hours include no acute events.  Plan  Current plan is for discharge with outpatient resources per TTS note.  Patient has been cleared by psychiatry.  Confirmed with , nurse practitioner.  John [name] is not under involuntary commitment.     John [name], John Comings, DO 10/07/20 0249    John [name], 10/09/20, DO 10/07/20 [phone]

## 2020-10-07 NOTE — BH Assessment (Signed)
Writer provided pt with outpatient resources.

## 2020-10-07 NOTE — ED Notes (Signed)
Patient has decided to leave ed at this time and not wait for cab service. E-signature not working at this time. Pt verbalized understanding of D/C instructions, prescriptions and follow up care with no further questions at this time. Pt in NAD and ambulatory at time of D/C.

## 2020-10-07 NOTE — ED Notes (Signed)
[employer] not available for transport and sundays. This RN contacted safe transport, they are unable to take patient unless they are from a group home or going to another hospital facility.

## 2020-10-09 ENCOUNTER — Emergency Department: Payer: Medicaid Other

## 2020-10-09 ENCOUNTER — Emergency Department
Admission: EM | Admit: 2020-10-09 | Discharge: 2020-10-10 | Disposition: A | Discharge status: Home / Self Care | Payer: Medicaid Other | Attending: Emergency Medicine | Admitting: Emergency Medicine

## 2020-10-09 ENCOUNTER — Other Ambulatory Visit: Payer: Self-pay

## 2020-10-09 DIAGNOSIS — S0532XA Ocular laceration without prolapse or loss of intraocular tissue, left eye, initial encounter: Secondary | ICD-10-CM | POA: Insufficient documentation

## 2020-10-09 DIAGNOSIS — Z23 Encounter for immunization: Secondary | ICD-10-CM | POA: Insufficient documentation

## 2020-10-09 DIAGNOSIS — R4689 Other symptoms and signs involving appearance and behavior: Secondary | ICD-10-CM

## 2020-10-09 DIAGNOSIS — F17210 Nicotine dependence, cigarettes, uncomplicated: Secondary | ICD-10-CM | POA: Insufficient documentation

## 2020-10-09 DIAGNOSIS — Y908 Blood alcohol level of 240 mg/100 ml or more: Secondary | ICD-10-CM | POA: Insufficient documentation

## 2020-10-09 DIAGNOSIS — Y92513 Shop (commercial) as the place of occurrence of the external cause: Secondary | ICD-10-CM | POA: Insufficient documentation

## 2020-10-09 DIAGNOSIS — F1014 Alcohol abuse with alcohol-induced mood disorder: Secondary | ICD-10-CM | POA: Diagnosis not present

## 2020-10-09 DIAGNOSIS — R456 Violent behavior: Secondary | ICD-10-CM | POA: Insufficient documentation

## 2020-10-09 DIAGNOSIS — F10129 Alcohol abuse with intoxication, unspecified: Secondary | ICD-10-CM | POA: Diagnosis present

## 2020-10-09 DIAGNOSIS — S0592XA Unspecified injury of left eye and orbit, initial encounter: Secondary | ICD-10-CM | POA: Diagnosis present

## 2020-10-09 DIAGNOSIS — W01198A Fall on same level from slipping, tripping and stumbling with subsequent striking against other object, initial encounter: Secondary | ICD-10-CM | POA: Insufficient documentation

## 2020-10-09 DIAGNOSIS — I10 Essential (primary) hypertension: Secondary | ICD-10-CM | POA: Diagnosis not present

## 2020-10-09 DIAGNOSIS — R4182 Altered mental status, unspecified: Secondary | ICD-10-CM | POA: Diagnosis not present

## 2020-10-09 DIAGNOSIS — F10920 Alcohol use, unspecified with intoxication, uncomplicated: Secondary | ICD-10-CM

## 2020-10-09 DIAGNOSIS — W19XXXA Unspecified fall, initial encounter: Secondary | ICD-10-CM

## 2020-10-09 DIAGNOSIS — F1094 Alcohol use, unspecified with alcohol-induced mood disorder: Secondary | ICD-10-CM | POA: Diagnosis present

## 2020-10-09 LAB — CBC WITH DIFFERENTIAL/PLATELET
Abs Immature Granulocytes: 0.03 10*3/uL (ref 0.00–0.07)
Basophils Absolute: 0.1 10*3/uL (ref 0.0–0.1)
Basophils Relative: 1 %
Eosinophils Absolute: 0.1 10*3/uL (ref 0.0–0.5)
Eosinophils Relative: 1 %
HCT: 49.7 % (ref 39.0–52.0)
Hemoglobin: 17.4 g/dL — ABNORMAL HIGH (ref 13.0–17.0)
Immature Granulocytes: 0 %
Lymphocytes Relative: 39 %
Lymphs Abs: 3 10*3/uL (ref 0.7–4.0)
MCH: 31.8 pg (ref 26.0–34.0)
MCHC: 35 g/dL (ref 30.0–36.0)
MCV: 90.7 fL (ref 80.0–100.0)
Monocytes Absolute: 0.6 10*3/uL (ref 0.1–1.0)
Monocytes Relative: 7 %
Neutro Abs: 4 10*3/uL (ref 1.7–7.7)
Neutrophils Relative %: 52 %
Platelets: 194 10*3/uL (ref 150–400)
RBC: 5.48 MIL/uL (ref 4.22–5.81)
RDW: 14.9 % (ref 11.5–15.5)
WBC: 7.7 10*3/uL (ref 4.0–10.5)
nRBC: 0 % (ref 0.0–0.2)

## 2020-10-09 LAB — COMPREHENSIVE METABOLIC PANEL
ALT: 21 U/L (ref 0–44)
AST: 27 U/L (ref 15–41)
Albumin: 3.6 g/dL (ref 3.5–5.0)
Alkaline Phosphatase: 69 U/L (ref 38–126)
Anion gap: 11 (ref 5–15)
BUN: 5 mg/dL — ABNORMAL LOW (ref 6–20)
CO2: 23 mmol/L (ref 22–32)
Calcium: 8.4 mg/dL — ABNORMAL LOW (ref 8.9–10.3)
Chloride: 106 mmol/L (ref 98–111)
Creatinine, Ser: 0.7 mg/dL (ref 0.61–1.24)
GFR, Estimated: >60 mL/min (ref >60)
Glucose, Bld: 98 mg/dL (ref 70–99)
Potassium: 3.5 mmol/L (ref 3.5–5.1)
Sodium: 140 mmol/L (ref 135–145)
Total Bilirubin: 0.6 mg/dL (ref 0.3–1.2)
Total Protein: 6.5 g/dL (ref 6.5–8.1)

## 2020-10-09 LAB — ACETAMINOPHEN LEVEL: Acetaminophen (Tylenol), Serum: <10 ug/mL — ABNORMAL LOW (ref 10–30)

## 2020-10-09 LAB — SALICYLATE LEVEL: Salicylate Lvl: <7 mg/dL — ABNORMAL LOW (ref 7.0–30.0)

## 2020-10-09 LAB — ETHANOL: Alcohol, Ethyl (B): 318 mg/dL (ref <10)

## 2020-10-09 MED ORDER — MIDAZOLAM HCL 2 MG/2ML IJ SOLN: 2.0000 mg | Freq: Once | INTRAMUSCULAR | Status: AC

## 2020-10-09 MED ORDER — LORAZEPAM 2 MG/ML IJ SOLN: 1.0000 mg | Freq: Once | INTRAMUSCULAR | Status: DC

## 2020-10-09 MED ORDER — MIDAZOLAM HCL 2 MG/2ML IJ SOLN
INTRAMUSCULAR | Status: AC
  Filled 2020-10-09: qty 2

## 2020-10-09 MED ORDER — HALOPERIDOL LACTATE 5 MG/ML IJ SOLN
10.0000 mg | Freq: Once | INTRAMUSCULAR | Status: AC
  Administered 2020-10-09: 10 mg via INTRAMUSCULAR
  Filled 2020-10-09: qty 2

## 2020-10-09 MED ORDER — TETANUS-DIPHTH-ACELL PERTUSSIS 5-2.5-18.5 LF-MCG/0.5 IM SUSY
0.5000 mL | PREFILLED_SYRINGE | Freq: Once | INTRAMUSCULAR | Status: AC
  Administered 2020-10-09: 0.5 mL via INTRAMUSCULAR
  Filled 2020-10-09: qty 0.5

## 2020-10-09 NOTE — ED Provider Notes (Signed)
[facility] Emergency Department Provider Note   ____________________________________________   Event Date/Time   First MD Initiated Contact with Patient 10/09/20 1949     (approximate)  I have reviewed the triage vital signs and the nursing notes.   HISTORY  Chief Complaint Fall (Fall with lac)    HPI John [name] is a 40 y.o. male who [place].  He has what appears to be a deep abrasion which is very small about 3 mm and a second 1 right next to it is 2 mm above the left eye over the outer part of the eye on the forehead.  He says he has had 2 quarts of beer.  He is not acting intoxicated.  He says he is also been in [place] in the past and is waiting on an disability from it.  Currently he is not having any pain anywhere.        Past Medical History:  Diagnosis Date   ASD (atrial septal defect)    Bacterial infection due to H. pylori    Hypertension    Kidney stones     Patient Active Problem List   Diagnosis Date Noted   Suicidal ideation    Malingerer 10/07/2019   Bipolar 1 disorder (HCC) 09/09/2019   Chronic back pain 08/10/2019   Encounter to establish care 07/12/2019   History of depression 07/12/2019   Smoking 07/12/2019   Depressive disorder 06/14/2019   Sepsis (HCC) 04/30/2019   Fall 04/30/2019   Left leg pain 04/30/2019   Orthopedic hardware present 04/30/2019   S/P cystoscopy 04/20/19 [facility]. Hx urethroplasty for urethral trauma in 2020 04/30/2019   Alcohol abuse with intoxication (HCC) 04/30/2019   Acute metabolic encephalopathy 04/30/2019   Hypoxia 04/30/2019   Acute Femoral condyle fracture (HCC) 04/30/2019   Severe sepsis (HCC) 04/30/2019   Alcohol abuse 01/14/2018   Substance induced mood disorder (HCC) 01/14/2018    Past Surgical History:  Procedure Laterality Date   ASD REPAIR     FOOT SURGERY Right    KNEE SURGERY Right     Prior to Admission medications    Medication Sig Start Date End Date Taking? Authorizing Provider  Melatonin 10 MG TABS Take 10 mg by mouth at bedtime.   Yes [provider]  gabapentin (NEURONTIN) 300 MG capsule Take 1 capsule (300 mg total) by mouth 3 (three) times daily. Patient not taking: No sig reported 10/27/19   Charm Rings, NP  haloperidol (HALDOL) 5 MG tablet Take 1 tablet (5 mg total) by mouth 2 (two) times daily. Patient not taking: No sig reported 10/27/19   Charm Rings, NP  hydrOXYzine (ATARAX/VISTARIL) 25 MG tablet Take 1 tablet (25 mg total) by mouth every 6 (six) hours as needed (anxiety/agitation or CIWA < or = 10). Patient not taking: No sig reported 10/27/19   Charm Rings, NP  ibuprofen (ADVIL) 600 MG tablet Take 1 tablet (600 mg total) by mouth every 6 (six) hours as needed for moderate pain. Patient not taking: No sig reported 10/27/19   Charm Rings, NP    Allergies Penicillins  History reviewed. No pertinent family history.  Social History Social History   Tobacco Use   Smoking status: Every Day    Types: Cigarettes   Smokeless tobacco: Never   Tobacco comments:    down to 2 cigarettes a day  Vaping Use   Vaping Use: Never used  Substance Use Topics   Alcohol use: Yes    Comment: Last consumed Friday 06/10/19, pt states he is "an alcoholic" and "wants to drink"   Drug use: Not Currently    Review of Systems  Constitutional: No fever/chills Eyes: No visual changes. ENT: No sore throat. Cardiovascular: Denies chest pain. Respiratory: Denies shortness of breath. Gastrointestinal: No abdominal pain.  No nausea, no vomiting.  No diarrhea.  No constipation. Genitourinary: Negative for dysuria. Musculoskeletal: Negative for back pain. Skin: Negative for rash. Neurological: Negative for headaches, focal weakness   ____________________________________________   PHYSICAL EXAM:  VITAL SIGNS: ED Triage Vitals  Enc Vitals Group     BP 10/09/20 1937 104/82     Pulse  Rate 10/09/20 1937 75     Resp 10/09/20 1937 18     Temp 10/09/20 1937 98.4 F (36.9 C)     Temp Source 10/09/20 1937 Oral     SpO2 10/09/20 1937 99 %     Weight 10/09/20 1939 176 lb 5.9 oz (80 kg)     Height 10/09/20 1939 5\' 10"  (1.778 m)     Head Circumference --      Peak Flow --      Pain Score 10/09/20 1938 0     Pain Loc --      Pain Edu? --      Excl. in GC? --     Constitutional: Alert and oriented.  Appears intoxicated Eyes: Conjunctivae are normal. PER. EOMI. Head: Atraumatic except for wounds as described in HPI. Nose: No congestion/rhinnorhea. Mouth/Throat: Mucous membranes are moist.  Oropharynx non-erythematous. Neck: No stridor. Cardiovascular: Normal rate, regular rhythm. Grossly normal heart sounds.  Good peripheral circulation. Respiratory: Normal respiratory effort.  No retractions. Lungs CTAB. Gastrointestinal: Soft and nontender. No distention. No abdominal bruits.  Musculoskeletal: No lower extremity tenderness nor edema.   Neurologic: Slurry speech speech . No gross focal neurologic deficits are appreciated.  Patient is moving all extremities equally but does not want to squeeze my fingers with his left hand although he does later move his hand well. Skin:  Skin is warm, dry and intact. No rash noted.   ____________________________________________   LABS (all labs ordered are listed, but only abnormal results are displayed)  Labs Reviewed  ACETAMINOPHEN LEVEL - Abnormal; Notable for the following components:      Result Value   Acetaminophen (Tylenol), Serum <10 (*)    All other components within normal limits  COMPREHENSIVE METABOLIC PANEL - Abnormal; Notable for the following components:   BUN 5 (*)    Calcium 8.4 (*)    All other components within normal limits  ETHANOL - Abnormal; Notable for the following components:   Alcohol, Ethyl (B) 318 (*)    All other components within normal limits  SALICYLATE LEVEL - Abnormal; Notable for the  following components:   Salicylate Lvl <7.0 (*)    All other components within normal limits  CBC WITH DIFFERENTIAL/PLATELET - Abnormal; Notable for the following components:   Hemoglobin 17.4 (*)    All other components within normal limits   ____________________________________________  EKG   ____________________________________________  RADIOLOGY 10/11/20, personally viewed and evaluated these images (plain radiographs) as part of my medical decision making, as well as reviewing the written report by the radiologist.  ED MD interpretation:    Official radiology report(s): CT HEAD WO CONTRAST (Jill Poling)  Result Date: 10/09/2020 CLINICAL DATA:  Duct skater up tended. Status post fall. Laceration over  left eye. Alcohol use. EXAM: CT HEAD WITHOUT CONTRAST CT CERVICAL SPINE WITHOUT CONTRAST TECHNIQUE: Multidetector CT imaging of the head and cervical spine was performed following the standard protocol without intravenous contrast. Multiplanar CT image reconstructions of the cervical spine were also generated. COMPARISON:  CT head 11/02/2019, CT angiography chest 04/30/2019 FINDINGS: CT HEAD FINDINGS Brain: No evidence of large-territorial acute infarction. No parenchymal hemorrhage. No mass lesion. No extra-axial collection. No mass effect or midline shift. No hydrocephalus. Basilar cisterns are patent. Vascular: No hyperdense vessel. Skull: No acute fracture or focal lesion. Sinuses/Orbits: Paranasal sinuses and mastoid air cells are clear. The orbits are unremarkable. Other: None. CT CERVICAL SPINE FINDINGS Alignment: Normal. Skull base and vertebrae: Mild multilevel degenerative changes of the spine. No acute fracture. No aggressive appearing focal osseous lesion or focal pathologic process. Soft tissues and spinal canal: No prevertebral fluid or swelling. No visible canal hematoma. Upper chest: Left apical cystic change similar compared to CT angiography 04/30/2019. No biapical  pneumothoraces. Other: None. IMPRESSION: 1. No acute intracranial abnormality. 2. No acute displaced fracture or traumatic listhesis of the cervical spine. Electronically Signed   By: Tish Frederickson M.D.   On: 10/09/2020 21:30   CT Cervical Spine Wo Contrast  Result Date: 10/09/2020 CLINICAL DATA:  Duct skater up tended. Status post fall. Laceration over left eye. Alcohol use. EXAM: CT HEAD WITHOUT CONTRAST CT CERVICAL SPINE WITHOUT CONTRAST TECHNIQUE: Multidetector CT imaging of the head and cervical spine was performed following the standard protocol without intravenous contrast. Multiplanar CT image reconstructions of the cervical spine were also generated. COMPARISON:  CT head 11/02/2019, CT angiography chest 04/30/2019 FINDINGS: CT HEAD FINDINGS Brain: No evidence of large-territorial acute infarction. No parenchymal hemorrhage. No mass lesion. No extra-axial collection. No mass effect or midline shift. No hydrocephalus. Basilar cisterns are patent. Vascular: No hyperdense vessel. Skull: No acute fracture or focal lesion. Sinuses/Orbits: Paranasal sinuses and mastoid air cells are clear. The orbits are unremarkable. Other: None. CT CERVICAL SPINE FINDINGS Alignment: Normal. Skull base and vertebrae: Mild multilevel degenerative changes of the spine. No acute fracture. No aggressive appearing focal osseous lesion or focal pathologic process. Soft tissues and spinal canal: No prevertebral fluid or swelling. No visible canal hematoma. Upper chest: Left apical cystic change similar compared to CT angiography 04/30/2019. No biapical pneumothoraces. Other: None. IMPRESSION: 1. No acute intracranial abnormality. 2. No acute displaced fracture or traumatic listhesis of the cervical spine. Electronically Signed   By: Tish Frederickson M.D.   On: 10/09/2020 21:30    ____________________________________________   PROCEDURES  Procedure(s) performed (including Critical  Care):  Procedures   ____________________________________________   INITIAL IMPRESSION / ASSESSMENT AND PLAN / ED COURSE  ----------------------------------------- 8:17 PM on 10/09/2020 ----------------------------------------- Patient is now refusing CT of the head and neck and threatening violence.  He is also offering sexual favors to the nurse.  Patient now wants to leave.  He is intoxicated and cannot leave unless he has someone who is with him.  Additionally he is now threatening the staff.  He is cussing and yelling.  He is currently a danger to himself and others.  I will give him some Haldol as I cannot calm him down verbally.  He also will get a little bit of Versed.  We will watch him carefully and when he sobers up we will attempt to let him go.  Patient later moved to the quad and signed out to the other side provider.  ____________________________________________   FINAL CLINICAL IMPRESSION(S) / ED DIAGNOSES  Final diagnoses:  Fall, initial encounter  Alcoholic intoxication without complication (HCC)  Altered mental status, unspecified altered mental status type  Combative behavior     ED Discharge Orders     None        Note:  This document was prepared using Dragon voice recognition software and may include unintentional dictation errors.    Arnaldo NatalMalinda, Lavance Beazer F, MD 10/09/20 [phone]

## 2020-10-09 NOTE — ED Triage Notes (Signed)
[place] and has a lac above left eye. Admits to "2qts of beer"

## 2020-10-10 DIAGNOSIS — F1094 Alcohol use, unspecified with alcohol-induced mood disorder: Secondary | ICD-10-CM | POA: Diagnosis present

## 2020-10-10 MED ORDER — LORAZEPAM 1 MG PO TABS: 1.0000 mg | ORAL_TABLET | Freq: Four times a day (QID) | ORAL | Status: DC | PRN

## 2020-10-10 MED ORDER — THIAMINE HCL 100 MG/ML IJ SOLN
100.0000 mg | Freq: Once | INTRAMUSCULAR | Status: DC
Start: 2020-10-10 — End: 2020-10-10

## 2020-10-10 MED ORDER — LOPERAMIDE HCL 2 MG PO CAPS: 2.0000 mg | ORAL_CAPSULE | ORAL | Status: DC | PRN

## 2020-10-10 MED ORDER — HYDROXYZINE HCL 25 MG PO TABS: 25.0000 mg | ORAL_TABLET | Freq: Four times a day (QID) | ORAL | Status: DC | PRN

## 2020-10-10 MED ORDER — LORAZEPAM 1 MG PO TABS
1.0000 mg | ORAL_TABLET | Freq: Every day | ORAL | Status: DC
Start: 2020-10-13 — End: 2020-10-10

## 2020-10-10 MED ORDER — LORAZEPAM 1 MG PO TABS: 1.0000 mg | ORAL_TABLET | Freq: Three times a day (TID) | ORAL | Status: DC

## 2020-10-10 MED ORDER — THIAMINE HCL 100 MG PO TABS
100.0000 mg | ORAL_TABLET | Freq: Every day | ORAL | Status: DC
Start: 2020-10-11 — End: 2020-10-10

## 2020-10-10 MED ORDER — ONDANSETRON 4 MG PO TBDP: 4.0000 mg | ORAL_TABLET | Freq: Four times a day (QID) | ORAL | Status: DC | PRN

## 2020-10-10 MED ORDER — LORAZEPAM 1 MG PO TABS
1.0000 mg | ORAL_TABLET | Freq: Four times a day (QID) | ORAL | Status: DC
  Administered 2020-10-10: 1 mg via ORAL
  Filled 2020-10-10: qty 1

## 2020-10-10 MED ORDER — ADULT MULTIVITAMIN W/MINERALS CH
1.0000 | ORAL_TABLET | Freq: Every day | ORAL | Status: DC
  Administered 2020-10-10: 1 via ORAL
  Filled 2020-10-10: qty 1

## 2020-10-10 MED ORDER — LORAZEPAM 1 MG PO TABS
1.0000 mg | ORAL_TABLET | Freq: Two times a day (BID) | ORAL | Status: DC
Start: 2020-10-12 — End: 2020-10-10

## 2020-10-10 NOTE — BH Assessment (Signed)
Writer faxed patient referral to [facility] [phone] for review. Awaiting to hear response.

## 2020-10-10 NOTE — ED Notes (Signed)
IVC  PAPERS  RESCINDED  PER  JAMIE  LORD  NP  INFORMED  ALLY  RN 

## 2020-10-10 NOTE — BH Assessment (Signed)
Comprehensive Clinical Assessment (CCA) Note  10/10/2020 John [name] [id number]  Ruthe Mannan, 40 year old male who presents to [facility] ED involuntarily for treatment.     During TTS assessment pt presents alert and oriented x 4, restless but cooperative, and mood-congruent with affect. The pt does not appear to be responding to internal or external stimuli. Neither is the pt presenting with any delusional thinking. Pt verified the information provided to triage RN.   Pt identifies his main complaint to be that he wants alcohol detox. Patient reports dinking "as much as I can get my hands on." Patient states his most recent admission to rehab was Aug/Sept 2021. Patient denies using any other illicit substances. Patient was brought to the ED after having a fall. Patient states he is feeling much better but has a small headache. Patient denies having any withdrawal symptoms at this time. Patient denies SI/HI/AH/VH. Pt contracts for safety. Patient reports he would like to go to detox. Patient was provided local resources. [facility] contacted [facility] for bed availability.    Per Asher Muir, NP pt does not meet criteria for inpatient psychiatric admission.    Chief Complaint:  Chief Complaint  Patient presents with   Fall    Fall with lac   Alcohol Problem    Alcohol detox   Visit Diagnosis: Alcohol induced mood disorder    CCA Screening, Triage and Referral (STR)  Patient Reported Information How did you hear about [place]? Self  Referral name: No data recorded Referral phone number: No data recorded  Whom do you see for routine medical problems? No data recorded Practice/Facility Name: No data recorded Practice/Facility Phone Number: No data recorded Name of Contact: No data recorded Contact Number: No data recorded Contact Fax Number: No data recorded Prescriber Name: No data recorded Prescriber Address (if known): No data recorded  What Is the Reason for Your Visit/Call Today? Patient was brought  into the ED after taking a fall. Patient reports he is wanting detox from alcohol.  How Long Has This Been Causing You Problems? <Week  What Do You Feel Would Help You the Most Today? Alcohol or Drug Use Treatment   Have You Recently Been in Any Inpatient Treatment (Hospital/Detox/Crisis Center/28-Day Program)? No data recorded Name/Location of Program/Hospital:No data recorded How Long Were You There? No data recorded When Were You Discharged? No data recorded  Have You Ever Received Services From [facility] Before? No data recorded Who Do You See at [facility]? No data recorded  Have You Recently Had Any Thoughts About Hurting Yourself? No  Are You Planning to Commit Suicide/Harm Yourself At This time? No   Have you Recently Had Thoughts About Hurting Someone Karolee Ohs? No  Explanation: No data recorded  Have You Used Any Alcohol or Drugs in the Past 24 Hours? Yes  How Long Ago Did You Use Drugs or Alcohol? No data recorded What Did You Use and How Much? " As much as I can drink."   Do You Currently Have a Therapist/Psychiatrist? No  Name of Therapist/Psychiatrist: No data recorded  Have You Been Recently Discharged From Any Office Practice or Programs? No  Explanation of Discharge From Practice/Program: No data recorded    CCA Screening Triage Referral Assessment Type of Contact: Face-to-Face  Is this Initial or Reassessment? No data recorded Date Telepsych consult ordered in [facility]:  11/02/19  Time Telepsych consult ordered in [facility]:  1519   Patient Reported Information Reviewed? No data recorded Patient Left Without Being Seen?  No data recorded Reason for Not Completing Assessment: No data recorded  Collateral Involvement: None provided   Does Patient Have a Court Appointed Legal Guardian? No data recorded Name and Contact of Legal Guardian: Self  If Minor and Not Living with Parent(s), Who has Custody? n/a  Is CPS involved or ever been involved? Never  Is  APS involved or ever been involved? Never   Patient Determined To Be At Risk for Harm To Self or Others Based on Review of Patient Reported Information or Presenting Complaint? No  Method: No data recorded Availability of Means: No data recorded Intent: No data recorded Notification Required: No data recorded Additional Information for Danger to Others Potential: No data recorded Additional Comments for Danger to Others Potential: No data recorded Are There Guns or Other Weapons in Your Home? No data recorded Types of Guns/Weapons: No data recorded Are These Weapons Safely Secured?                            No data recorded Who Could Verify You Are Able To Have These Secured: No data recorded Do You Have any Outstanding Charges, Pending Court Dates, Parole/Probation? No data recorded Contacted To Inform of Risk of Harm To Self or Others: No data recorded  Location of Assessment: [facility] ED   Does Patient Present under Involuntary Commitment? No  IVC Papers Initial File Date: No data recorded  [place] of Residence: [place]   Patient Currently Receiving the Following Services: Not Receiving Services   Determination of Need: Urgent (48 hours)   Options For Referral: Therapeutic Triage Services      Recommendations for Services/Supports/Treatments:    DSM5 Diagnoses: Patient Active Problem List   Diagnosis Date Noted   Alcohol-induced mood disorder (HCC) 10/10/2020   Suicidal ideation    Malingerer 10/07/2019   Bipolar 1 disorder (HCC) 09/09/2019   Chronic back pain 08/10/2019   Smoking 07/12/2019   Sepsis (HCC) 04/30/2019   Fall 04/30/2019   Left leg pain 04/30/2019   Orthopedic hardware present 04/30/2019   S/P cystoscopy 04/20/19 [facility]. Hx urethroplasty for urethral trauma in 2020 04/30/2019   Alcohol abuse with intoxication (HCC) 04/30/2019   Acute Femoral condyle fracture (HCC) 04/30/2019   Alcohol abuse 01/14/2018   Substance induced mood disorder (HCC)  01/14/2018    Patient Centered Plan: Patient is on the following Treatment Plan(s):  Substance Abuse   Referrals to Alternative Service(s): Referred to Alternative Service(s):   Place:   Date:   Time:    Referred to Alternative Service(s):   Place:   Date:   Time:    Referred to Alternative Service(s):   Place:   Date:   Time:    Referred to Alternative Service(s):   Place:   Date:   Time:     Adamari Frede Dierdre Searles, Counselor, LCAS-A

## 2020-10-10 NOTE — ED Notes (Signed)
Does not want to wait in stretcher until [facility] arrives. Pt states he will wait in lobby for them. Pt voluntary. Encouraged him to wait in bed until ride arrives. Declined.

## 2020-10-10 NOTE — ED Notes (Signed)
Pt given a cup of water per request.  

## 2020-10-10 NOTE — ED Notes (Signed)
Discharged to [facility].

## 2020-10-10 NOTE — ED Notes (Signed)
  Verified correct patient and correct discharge papers given. Pt alert and oriented X 4, stable for discharge. RR even and unlabored, color WNL. Discussed discharge instructions and follow-up as directed. Discharge medications discussed, when prescribed. Pt had opportunity to ask questions, and RN available to provide patient and/or family education. Left with all of belongings. Able to eat, drink and ambulate safely.

## 2020-10-10 NOTE — ED Notes (Signed)
Patient sleeping in no distress. VS not obtained at this time. Will continue to monitor.  

## 2020-10-10 NOTE — ED Provider Notes (Addendum)
6:59 AM  Patient had been given Haldol and Versed around 8:30 PM.  Patient has been resting all night.  He has been in the ER for over 11 hours.  At this time patient is able to wake up.  He already had negative imaging.  He states that he is homeless but is declining social work evaluation.  States that he may just go to [facility].  We will ambulate patient once he is steady on his feet will discharge home  7:20 AM patient now reporting SI with a plan to jump in front of the car.  He states that it started yesterday.  Patient is under IVC from earlier so we will consult psychiatry  The patient has been placed in psychiatric observation due to the need to provide a safe environment for the patient while obtaining psychiatric consultation and evaluation, as well as ongoing medical and medication management to treat the patient's condition.  The patient has been placed under full IVC at this time.    Concha Se, MD 10/10/20 0701    Concha Se, MD 10/10/20 [phone]

## 2020-10-10 NOTE — Consult Note (Signed)
[facility] Psych ED Discharge  10/10/2020 10:40 AM John [name]  MRN:  [id number]  Method of visit?: Face to Face   Principal Problem: Alcohol-induced mood disorder [facility]) Discharge Diagnoses: Principal Problem:   Alcohol-induced mood disorder (HCC) Active Problems:   Alcohol abuse with intoxication (HCC)   Subjective: "I'm alright, I have a headache."  40 yo male presented to the ED under the influence of alcohol.  Today, he is clear and coherent with no suicidal/homicidal ideations, hallucinations, or paranoia.  He does have a  headache after drinking last night.  Typically, he drinks "as much as I can".  His last detox/rehab was at [facility] last fall, 2021.  He is interested in detox at [facility], information sent, awaiting acceptance.  No history of withdrawal seizures.  Denies other substance use.  Calm and cooperative.  Total Time spent with patient: 45 minutes  Past Psychiatric History: depression, anxiety, alcohol use d/o  Past Medical History:  Past Medical History:  Diagnosis Date   ASD (atrial septal defect)    Bacterial infection due to H. pylori    Hypertension    Kidney stones     Past Surgical History:  Procedure Laterality Date   ASD REPAIR     FOOT SURGERY Right    KNEE SURGERY Right    Family History: History reviewed. No pertinent family history. Family Psychiatric  History: none Social History:  Social History   Substance and Sexual Activity  Alcohol Use Yes   Comment: Last consumed Friday 06/10/19, pt states he is "an alcoholic" and "wants to drink"     Social History   Substance and Sexual Activity  Drug Use Not Currently    Social History   Socioeconomic History   Marital status: Single    Spouse name: Not on file   Number of children: Not on file   Years of education: Not on file   Highest education level: Not on file  Occupational History   Not on file  Tobacco Use   Smoking status: Every Day    Types: Cigarettes   Smokeless tobacco:  Never   Tobacco comments:    down to 2 cigarettes a day  Vaping Use   Vaping Use: Never used  Substance and Sexual Activity   Alcohol use: Yes    Comment: Last consumed Friday 06/10/19, pt states he is "an alcoholic" and "wants to drink"   Drug use: Not Currently   Sexual activity: Yes    Birth control/protection: None  Other Topics Concern   Not on file  Social History Narrative   Not on file   Social Determinants of Health   Financial Resource Strain: Not on file  Food Insecurity: Not on file  Transportation Needs: Not on file  Physical Activity: Not on file  Stress: Not on file  Social Connections: Not on file    Tobacco Cessation:  A prescription for an FDA-approved tobacco cessation medication was offered at discharge and the patient refused  Current Medications: No current facility-administered medications for this encounter.   Current Outpatient Medications  Medication Sig Dispense Refill   Melatonin 10 MG TABS Take 10 mg by mouth at bedtime.     gabapentin (NEURONTIN) 300 MG capsule Take 1 capsule (300 mg total) by mouth 3 (three) times daily. (Patient not taking: No sig reported) 90 capsule 1   haloperidol (HALDOL) 5 MG tablet Take 1 tablet (5 mg total) by mouth 2 (two) times daily. (Patient not taking: No sig reported)  60 tablet 1   hydrOXYzine (ATARAX/VISTARIL) 25 MG tablet Take 1 tablet (25 mg total) by mouth every 6 (six) hours as needed (anxiety/agitation or CIWA < or = 10). (Patient not taking: No sig reported) 30 tablet 0   ibuprofen (ADVIL) 600 MG tablet Take 1 tablet (600 mg total) by mouth every 6 (six) hours as needed for moderate pain. (Patient not taking: No sig reported) 30 tablet 0   PTA Medications: (Not in a hospital admission)   Musculoskeletal: Strength & Muscle Tone: within normal limits Gait & Station: normal Patient leans: N/A  Psychiatric Specialty Exam: Physical Exam Vitals and nursing note reviewed.  Constitutional:      Appearance:  Normal appearance.  HENT:     Head: Normocephalic.     Nose: Nose normal.  Pulmonary:     Effort: Pulmonary effort is normal.  Musculoskeletal:        General: Normal range of motion.     Cervical back: Normal range of motion.  Neurological:     General: No focal deficit present.     Mental Status: He is alert and oriented to person, place, and time.  Psychiatric:        Attention and Perception: Attention and perception normal.        Mood and Affect: Mood is anxious.        Speech: Speech normal.        Behavior: Behavior normal. Behavior is cooperative.        Thought Content: Thought content normal.        Cognition and Memory: Cognition and memory normal.        Judgment: Judgment normal.    Review of Systems  Neurological:  Positive for headaches.  Psychiatric/Behavioral:  Positive for substance abuse. The patient is nervous/anxious.   All other systems reviewed and are negative.  Blood pressure 121/88, pulse 88, temperature (!) 97.5 F (36.4 C), temperature source Oral, resp. rate 20, height 5\' 10"  (1.778 m), weight 80 kg, SpO2 96 %.Body mass index is 25.31 kg/m.  General Appearance: Casual  Eye Contact:  Fair  Speech:  Normal Rate  Volume:  Normal  Mood:  Anxious  Affect:  Congruent  Thought Process:  Coherent and Descriptions of Associations: Intact  Orientation:  Full (Time, Place, and Person)  Thought Content:  WDL and Logical  Suicidal Thoughts:  No  Homicidal Thoughts:  No  Memory:  Immediate;   Good Recent;   Good Remote;   Good  Judgement:  Fair  Insight:  Fair  Psychomotor Activity:  Decreased  Concentration:  Concentration: Fair and Attention Span: Fair  Recall:  Good  Fund of Knowledge:  Fair  Language:  Good  Akathisia:  No  Handed:  Right  AIMS (if indicated):     Assets:  Leisure Time Resilience  ADL's:  Intact  Cognition:  WNL  Sleep:         Physical Exam: Physical Exam Vitals and nursing note reviewed.  Constitutional:       Appearance: Normal appearance.  HENT:     Head: Normocephalic.     Nose: Nose normal.  Pulmonary:     Effort: Pulmonary effort is normal.  Musculoskeletal:        General: Normal range of motion.     Cervical back: Normal range of motion.  Neurological:     General: No focal deficit present.     Mental Status: He is alert and oriented to person, place, and time.  Psychiatric:        Attention and Perception: Attention and perception normal.        Mood and Affect: Mood is anxious.        Speech: Speech normal.        Behavior: Behavior normal. Behavior is cooperative.        Thought Content: Thought content normal.        Cognition and Memory: Cognition and memory normal.        Judgment: Judgment normal.   Review of Systems  Neurological:  Positive for headaches.  Psychiatric/Behavioral:  Positive for substance abuse. The patient is nervous/anxious.   All other systems reviewed and are negative. Blood pressure 121/88, pulse 88, temperature (!) 97.5 F (36.4 C), temperature source Oral, resp. rate 20, height 5\' 10"  (1.778 m), weight 80 kg, SpO2 96 %. Body mass index is 25.31 kg/m.   Demographic Factors:  Male and Caucasian  Loss Factors: NA  Historical Factors: NA  Risk Reduction Factors:   Positive social support  Continued Clinical Symptoms:  Anxiety, mild and headache  Cognitive Features That Contribute To Risk:  None    Suicide Risk:  Minimal: No identifiable suicidal ideation.  Patients presenting with no risk factors but with morbid ruminations; may be classified as minimal risk based on the severity of the depressive symptoms    Plan Of Care/Follow-up recommendations:  Alcohol intoxication: -Ativan detox protocol in place -Seeking detox at [facility] Activity:  as tolerated  Diet:  heart healthy diet  Disposition: discharge with hopes for admission at [facility] , NP 10/10/2020, 10:40 AM

## 2020-10-10 NOTE — ED Notes (Signed)
Pt asleep, breakfast tray placed on back of the bed. 

## 2020-10-15 ENCOUNTER — Other Ambulatory Visit: Payer: Self-pay

## 2020-10-15 ENCOUNTER — Emergency Department
Admission: EM | Admit: 2020-10-15 | Discharge: 2020-10-16 | Disposition: A | Discharge status: Home / Self Care | Payer: Medicaid Other | Attending: Emergency Medicine | Admitting: Emergency Medicine

## 2020-10-15 DIAGNOSIS — R45850 Homicidal ideations: Secondary | ICD-10-CM | POA: Diagnosis not present

## 2020-10-15 DIAGNOSIS — F319 Bipolar disorder, unspecified: Secondary | ICD-10-CM | POA: Diagnosis present

## 2020-10-15 DIAGNOSIS — R45851 Suicidal ideations: Secondary | ICD-10-CM | POA: Insufficient documentation

## 2020-10-15 DIAGNOSIS — F17200 Nicotine dependence, unspecified, uncomplicated: Secondary | ICD-10-CM | POA: Diagnosis present

## 2020-10-15 DIAGNOSIS — F17210 Nicotine dependence, cigarettes, uncomplicated: Secondary | ICD-10-CM | POA: Diagnosis not present

## 2020-10-15 DIAGNOSIS — Y907 Blood alcohol level of 200-239 mg/100 ml: Secondary | ICD-10-CM | POA: Insufficient documentation

## 2020-10-15 DIAGNOSIS — R0789 Other chest pain: Secondary | ICD-10-CM | POA: Insufficient documentation

## 2020-10-15 DIAGNOSIS — Z20822 Contact with and (suspected) exposure to covid-19: Secondary | ICD-10-CM | POA: Diagnosis not present

## 2020-10-15 DIAGNOSIS — F1994 Other psychoactive substance use, unspecified with psychoactive substance-induced mood disorder: Secondary | ICD-10-CM | POA: Diagnosis present

## 2020-10-15 DIAGNOSIS — F10129 Alcohol abuse with intoxication, unspecified: Secondary | ICD-10-CM | POA: Diagnosis present

## 2020-10-15 DIAGNOSIS — IMO0001 Reserved for inherently not codable concepts without codable children: Secondary | ICD-10-CM | POA: Diagnosis present

## 2020-10-15 DIAGNOSIS — I10 Essential (primary) hypertension: Secondary | ICD-10-CM | POA: Insufficient documentation

## 2020-10-15 DIAGNOSIS — R059 Cough, unspecified: Secondary | ICD-10-CM | POA: Diagnosis not present

## 2020-10-15 DIAGNOSIS — F1010 Alcohol abuse, uncomplicated: Secondary | ICD-10-CM | POA: Diagnosis present

## 2020-10-15 DIAGNOSIS — F1094 Alcohol use, unspecified with alcohol-induced mood disorder: Secondary | ICD-10-CM | POA: Diagnosis present

## 2020-10-15 DIAGNOSIS — F1024 Alcohol dependence with alcohol-induced mood disorder: Secondary | ICD-10-CM | POA: Diagnosis present

## 2020-10-15 NOTE — ED Triage Notes (Signed)
Pt brought in by [facility] pt is homeless, co chest pain, back pain and suicidal ideation. Pt recently here for the same, positive etoh.

## 2020-10-16 ENCOUNTER — Emergency Department: Payer: Medicaid Other

## 2020-10-16 DIAGNOSIS — F10129 Alcohol abuse with intoxication, unspecified: Secondary | ICD-10-CM

## 2020-10-16 DIAGNOSIS — F1010 Alcohol abuse, uncomplicated: Secondary | ICD-10-CM

## 2020-10-16 LAB — CBC
HCT: 47.3 % (ref 39.0–52.0)
Hemoglobin: 16.4 g/dL (ref 13.0–17.0)
MCH: 32.2 pg (ref 26.0–34.0)
MCHC: 34.7 g/dL (ref 30.0–36.0)
MCV: 92.9 fL (ref 80.0–100.0)
Platelets: 242 10*3/uL (ref 150–400)
RBC: 5.09 MIL/uL (ref 4.22–5.81)
RDW: 15.8 % — ABNORMAL HIGH (ref 11.5–15.5)
WBC: 8.7 10*3/uL (ref 4.0–10.5)
nRBC: 0 % (ref 0.0–0.2)

## 2020-10-16 LAB — COMPREHENSIVE METABOLIC PANEL
ALT: 24 U/L (ref 0–44)
AST: 35 U/L (ref 15–41)
Albumin: 3.7 g/dL (ref 3.5–5.0)
Alkaline Phosphatase: 85 U/L (ref 38–126)
Anion gap: 12 (ref 5–15)
BUN: 8 mg/dL (ref 6–20)
CO2: 28 mmol/L (ref 22–32)
Calcium: 8.3 mg/dL — ABNORMAL LOW (ref 8.9–10.3)
Chloride: 100 mmol/L (ref 98–111)
Creatinine, Ser: 0.7 mg/dL (ref 0.61–1.24)
GFR, Estimated: >60 mL/min (ref >60)
Glucose, Bld: 152 mg/dL — ABNORMAL HIGH (ref 70–99)
Potassium: 3.6 mmol/L (ref 3.5–5.1)
Sodium: 140 mmol/L (ref 135–145)
Total Bilirubin: 0.5 mg/dL (ref 0.3–1.2)
Total Protein: 7 g/dL (ref 6.5–8.1)

## 2020-10-16 LAB — RESP PANEL BY RT-PCR (FLU A&B, COVID) ARPGX2
Influenza A by PCR: NEGATIVE
Influenza B by PCR: NEGATIVE
SARS Coronavirus 2 by RT PCR: NEGATIVE

## 2020-10-16 LAB — ETHANOL: Alcohol, Ethyl (B): 226 mg/dL — ABNORMAL HIGH (ref <10)

## 2020-10-16 LAB — TROPONIN I (HIGH SENSITIVITY)
Troponin I (High Sensitivity): 5 ng/L (ref <18)
Troponin I (High Sensitivity): 4 ng/L (ref <18)

## 2020-10-16 MED ORDER — THIAMINE HCL 100 MG/ML IJ SOLN: 100.0000 mg | Freq: Every day | INTRAMUSCULAR | Status: DC

## 2020-10-16 MED ORDER — NICOTINE 21 MG/24HR TD PT24
21.0000 mg | MEDICATED_PATCH | Freq: Once | TRANSDERMAL | Status: DC
  Administered 2020-10-16: 21 mg via TRANSDERMAL
  Filled 2020-10-16: qty 1

## 2020-10-16 MED ORDER — THIAMINE HCL 100 MG PO TABS: 100.0000 mg | ORAL_TABLET | Freq: Every day | ORAL | Status: DC

## 2020-10-16 MED ORDER — LORAZEPAM 2 MG PO TABS: 0.0000 mg | ORAL_TABLET | Freq: Two times a day (BID) | ORAL | Status: DC

## 2020-10-16 MED ORDER — LORAZEPAM 2 MG PO TABS
0.0000 mg | ORAL_TABLET | Freq: Four times a day (QID) | ORAL | Status: DC
  Administered 2020-10-16: 2 mg via ORAL
  Filled 2020-10-16: qty 1

## 2020-10-16 MED ORDER — MELATONIN 5 MG PO TABS: 2.5000 mg | ORAL_TABLET | Freq: Every day | ORAL | Status: DC

## 2020-10-16 MED ORDER — GABAPENTIN 300 MG PO CAPS: 600.0000 mg | ORAL_CAPSULE | Freq: Three times a day (TID) | ORAL | Status: DC

## 2020-10-16 MED ORDER — LORAZEPAM 2 MG/ML IJ SOLN: 0.0000 mg | Freq: Two times a day (BID) | INTRAMUSCULAR | Status: DC

## 2020-10-16 MED ORDER — LORAZEPAM 2 MG/ML IJ SOLN: 0.0000 mg | Freq: Four times a day (QID) | INTRAMUSCULAR | Status: DC

## 2020-10-16 NOTE — Discharge Instructions (Addendum)
Steps to find a Primary Care Provider (PCP):  Call [phone] or [phone] to access "[place] [place] Find a Doctor Service."  2.  You may also go on the [facility] website at [email or web address]  

## 2020-10-16 NOTE — ED Notes (Signed)
Hourly rounding reveals patient in room. No complaints, stable, in no acute distress. Q15 minute rounds and monitoring via Security Cameras to continue. 

## 2020-10-16 NOTE — ED Notes (Signed)
Hourly rounding reveals patient in room. No complaints, stable, in no acute distress. Q15 minute rounds and monitoring via security cameras to continue.

## 2020-10-16 NOTE — ED Notes (Signed)
Black Nikes White socks puma 2 packs cig  Cell phone  Wallet  Jeans Brown belt [employer] book bag of belongings   Pt belongings taken with pt and given to quad Charity fundraiser.

## 2020-10-16 NOTE — ED Notes (Signed)
Patient is stable in NAD. He is calm and cooperative. He is discharged to home via self. Discharge instruction is given and patient verbalized understanding. No issues.

## 2020-10-16 NOTE — ED Notes (Signed)
Hourly rounding reveals patient in room. No complaints, stable, in no acute distress. Q15 minute rounds and monitoring via Rover and Officer to continue.   

## 2020-10-16 NOTE — ED Notes (Signed)
Pt. Transferred from Triage to room [facility] after dressing out and screening for contraband. Report to include Situation, Background, Assessment and Recommendations from Raquel RN. Pt. Oriented to Quad including Q15 minute rounds as well as Psychologist, counselling for their protection. Patient is alert and oriented, warm and dry in no acute distress. Patient reports SI. Denied HI, and AVH. Pt. Encouraged to let me know if needs arise.

## 2020-10-16 NOTE — Consult Note (Signed)
[facility] Face-to-Face Psychiatry Consult   Reason for Consult:Chest Pain and Suicidal Referring Physician: Dr. Elesa MassedWard Patient Identification: John [name] Gade MRN:  [id number] Principal Diagnosis: <principal problem not specified> Diagnosis:  Active Problems:   Alcohol abuse   Substance induced mood disorder (HCC)   Alcohol abuse with intoxication (HCC)   Smoking   Bipolar 1 disorder (HCC)   Alcohol-induced mood disorder (HCC)   Total Time spent with patient: 45 minutes  Subjective: [facility] John [name] Toda is a 40 y.o. male patient [facility] ED via law enforcement voluntary. The patient has been seen in the ER for the same presenting problems. The patient was seen on 08.17.22 and was interested in detox at [facility]; information was sent, awaiting acceptance. [facility] accepted the patient; from the nurse's note (08.17.22), "Does not want to wait on a stretcher until [facility] arrives. Pt states he will wait in the lobby for them. Pt voluntary. Encouraged him to wait in bed until ride arrived. Declined." It is not clear if the patient did leave with [facility]. The patient's BAL is 226 mg/dl.  The patient was seen face-to-face by this provider; the chart was reviewed and consulted with Dr. Elesa MassedWard on 10/16/2020 due to the patient's care. It was discussed with the EDP that the patient does not meet the criteria to be admitted to the psychiatric inpatient unit. Once discharged, the patient will be given resources for an outpatient substance abuse treatment facility. On evaluation, the patient is alert and oriented x 3, inebriated but calm and cooperative, and mood-congruent with affect.  The patient does not appear to be responding to internal or external stimuli. Neither is the patient presenting with any delusional thinking. The patient admits to SI/HI/AVH, which he is presenting with more of a malingering behavior due to his homelessness and substance use diagnosis. The patient is not presenting with any  psychotic or paranoid behaviors.   Recommendations:  Patient is psychiatrically cleared. Discharge when medically cleared.  HPI: Per Dr. Elesa MassedWard, John [name] Eberwein is a 40 y.o. male with history of hypertension, substance abuse, bipolar disorder who presents to the emergency department stating that he is suicidal for the past couple of weeks with plan to step out in front of a car.  Also states "if I had a gun I would shoot my mom in the head".  Denies any hallucinations.  States he is also having sharp left-sided chest pain for the past couple of days with shortness of breath.  No vomiting or diarrhea.  No diaphoresis or dizziness.  States he has had a productive cough.  No fever.  No lower extremity swelling or pain.  Has been drinking alcohol today.  States he drinks alcohol every day and states it is "as much as I can get my hands on".  Denies any drug use.  States he is currently homeless.  Past Psychiatric History: History reviewed. No pertinent past psychiatric history  Risk to Self:   Risk to Others:   Prior Inpatient Therapy:   Prior Outpatient Therapy:    Past Medical History:  Past Medical History:  Diagnosis Date   ASD (atrial septal defect)    Bacterial infection due to H. pylori    Hypertension    Kidney stones     Past Surgical History:  Procedure Laterality Date   ASD REPAIR     FOOT SURGERY Right    KNEE SURGERY Right    Family History: No family history on file. Family  Psychiatric  History:  Social History:  Social History   Substance and Sexual Activity  Alcohol Use Yes   Comment: Last consumed Friday 06/10/19, pt states he is "an alcoholic" and "wants to drink"     Social History   Substance and Sexual Activity  Drug Use Not Currently    Social History   Socioeconomic History   Marital status: Single    Spouse name: Not on file   Number of children: Not on file   Years of education: Not on file   Highest education level: Not on file  Occupational History    Not on file  Tobacco Use   Smoking status: Every Day    Types: Cigarettes   Smokeless tobacco: Never   Tobacco comments:    down to 2 cigarettes a day  Vaping Use   Vaping Use: Never used  Substance and Sexual Activity   Alcohol use: Yes    Comment: Last consumed Friday 06/10/19, pt states he is "an alcoholic" and "wants to drink"   Drug use: Not Currently   Sexual activity: Yes    Birth control/protection: None  Other Topics Concern   Not on file  Social History Narrative   Not on file   Social Determinants of Health   Financial Resource Strain: Not on file  Food Insecurity: Not on file  Transportation Needs: Not on file  Physical Activity: Not on file  Stress: Not on file  Social Connections: Not on file   Additional Social History:    Allergies:   Allergies  Allergen Reactions   Penicillins     Did it involve swelling of the face/tongue/throat, SOB, or low BP? No Did it involve sudden or severe rash/hives, skin peeling, or any reaction on the inside of your mouth or nose? No Did you need to seek medical attention at a hospital or doctor's office? Unknown When did it last happen?       If all above answers are "NO", may proceed with cephalosporin use.    Labs:  Results for orders placed or performed during the hospital encounter of 10/15/20 (from the past 48 hour(s))  CBC     Status: Abnormal   Collection Time: 10/16/20 12:13 AM  Result Value Ref Range   WBC 8.7 4.0 - 10.5 K/uL   RBC 5.09 4.22 - 5.81 MIL/uL   Hemoglobin 16.4 13.0 - 17.0 g/dL   HCT 73.7 10.6 - 26.9 %   MCV 92.9 80.0 - 100.0 fL   MCH 32.2 26.0 - 34.0 pg   MCHC 34.7 30.0 - 36.0 g/dL   RDW 48.5 (H) 46.2 - 70.3 %   Platelets 242 150 - 400 K/uL   nRBC 0.0 0.0 - 0.2 %    Comment: Performed at [facility], [address] [place], [place] [address]  Comprehensive metabolic panel     Status: Abnormal   Collection Time: 10/16/20 12:13 AM  Result Value Ref Range   Sodium 140 135 - 145  mmol/L   Potassium 3.6 3.5 - 5.1 mmol/L   Chloride 100 98 - 111 mmol/L   CO2 28 22 - 32 mmol/L   Glucose, Bld 152 (H) 70 - 99 mg/dL    Comment: Glucose reference range applies only to samples taken after fasting for at least 8 hours.   BUN 8 6 - 20 mg/dL   Creatinine, Ser 8.18 0.61 - 1.24 mg/dL   Calcium 8.3 (L) 8.9 - 10.3 mg/dL   Total Protein 7.0  6.5 - 8.1 g/dL   Albumin 3.7 3.5 - 5.0 g/dL   AST 35 15 - 41 U/L   ALT 24 0 - 44 U/L   Alkaline Phosphatase 85 38 - 126 U/L   Total Bilirubin 0.5 0.3 - 1.2 mg/dL   GFR, Estimated >09 >98 mL/min    Comment: (NOTE) Calculated using the CKD-EPI Creatinine Equation (2021)    Anion gap 12 5 - 15    Comment: Performed at [facility], [address], [place], [place] [address]  Troponin I (High Sensitivity)     Status: None   Collection Time: 10/16/20 12:13 AM  Result Value Ref Range   Troponin I (High Sensitivity) 5 <18 ng/L    Comment: (NOTE) Elevated high sensitivity troponin I (hsTnI) values and significant  changes across serial measurements may suggest ACS but many other  chronic and acute conditions are known to elevate hsTnI results.  Refer to the "Links" section for chest pain algorithms and additional  guidance. Performed at [facility], [address] [place], [place] [address]   Ethanol     Status: Abnormal   Collection Time: 10/16/20 12:13 AM  Result Value Ref Range   Alcohol, Ethyl (B) 226 (H) <10 mg/dL    Comment: (NOTE) Lowest detectable limit for serum alcohol is 10 mg/dL.  For medical purposes only. Performed at [facility], [address] [place], [place] [address]     Current Facility-Administered Medications  Medication Dose Route Frequency Provider Last Rate Last Admin   gabapentin (NEURONTIN) capsule 600 mg  600 mg Oral TID Ward, Kristen N, DO       LORazepam (ATIVAN) injection 0-4 mg  0-4 mg Intravenous Q6H Ward, Kristen N, DO       Or   LORazepam (ATIVAN) tablet 0-4 mg   0-4 mg Oral Q6H Ward, Kristen N, DO   2 mg at 10/16/20 0052   [START ON 10/18/2020] LORazepam (ATIVAN) injection 0-4 mg  0-4 mg Intravenous Q12H Ward, Kristen N, DO       Or   [START ON 10/18/2020] LORazepam (ATIVAN) tablet 0-4 mg  0-4 mg Oral Q12H Ward, Kristen N, DO       [START ON 10/17/2020] melatonin tablet 2.5 mg  2.5 mg Oral QHS Ward, Kristen N, DO       nicotine (NICODERM CQ - dosed in mg/24 hours) patch 21 mg  21 mg Transdermal Once Ward, Kristen N, DO   21 mg at 10/16/20 9379   thiamine tablet 100 mg  100 mg Oral Daily Ward, Kristen N, DO       Or   thiamine (B-1) injection 100 mg  100 mg Intravenous Daily Ward, Kristen N, DO       No current outpatient medications on file.    Musculoskeletal: Strength & Muscle Tone: within normal limits Gait & Station: normal Patient leans: N/A  Psychiatric Specialty Exam:  Presentation  General Appearance: Bizarre; Fairly Groomed  Eye Contact:Fair  Speech:Normal Rate  Speech Volume:Normal  Handedness:Right   Mood and Affect  Mood:Euthymic  Affect:Congruent   Thought Process  Thought Processes:Coherent  Descriptions of Associations:Intact  Orientation:Full (Time, Place and Person)  Thought Content:Logical  History of Schizophrenia/Schizoaffective disorder:No  Duration of Psychotic Symptoms:No data recorded Hallucinations:Hallucinations: None  Ideas of Reference:None  Suicidal Thoughts:Suicidal Thoughts: Yes, Passive SI Passive Intent and/or Plan: Without Intent; Without Plan  Homicidal Thoughts:Homicidal Thoughts: Yes, Passive HI Passive Intent and/or Plan: Without Intent   Sensorium  Memory:Immediate  Fair; Recent Fair  Judgment:Fair  Insight:Fair   Executive Functions  Concentration:Fair  Attention Span:Fair  Recall:Fair  [employer] of Knowledge:Fair  Language:Fair   Psychomotor Activity  Psychomotor Activity:Psychomotor Activity: Normal   Assets  Assets:Communication Skills; Desire for  Improvement; Financial Resources/Insurance; Housing; Physical Health; Social Support   Sleep  Sleep:Sleep: Fair   Physical Exam: Physical Exam Vitals and nursing note reviewed.  Constitutional:      Appearance: He is normal weight.  HENT:     Head: Normocephalic and atraumatic.  Cardiovascular:     Rate and Rhythm: Normal rate.  Pulmonary:     Effort: Pulmonary effort is normal.  Musculoskeletal:        General: Normal range of motion.     Cervical back: Normal range of motion and neck supple.  Neurological:     Mental Status: He is alert and oriented to person, place, and time.  Psychiatric:        Attention and Perception: Attention and perception normal.        Mood and Affect: Mood normal. Affect is inappropriate.        Speech: Speech normal.        Behavior: Behavior normal.        Thought Content: Thought content includes homicidal and suicidal ideation.        Cognition and Memory: Cognition and memory normal.        Judgment: Judgment normal.   ROS Blood pressure (!) 129/103, pulse 93, temperature 97.8 F (36.6 C), temperature source Oral, resp. rate 20, height 5\' 6"  (1.676 m), weight 73.9 kg, SpO2 95 %. Body mass index is 26.31 kg/m.  Treatment Plan Summary: Plan give patient resources for substance abuse treatment facility  Disposition: No evidence of imminent risk to self or others at present.   Patient does not meet criteria for psychiatric inpatient admission. Supportive therapy provided about ongoing stressors. Refer to IOP. Discussed crisis plan, support from social network, calling 911, coming to the Emergency Department, and calling Suicide Hotline.  , NP 10/16/2020 1:43 AM
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
## 2020-10-16 NOTE — ED Provider Notes (Signed)
[facility] Emergency Department Provider Note  ____________________________________________   Event Date/Time   First MD Initiated Contact with Patient 10/16/20 0005     (approximate)  I have reviewed the triage vital signs and the nursing notes.   HISTORY  Chief Complaint Chest Pain and Suicidal    HPI John [name] is a 40 y.o. male with history of hypertension, substance abuse, bipolar disorder who presents to the emergency department stating that he is suicidal for the past couple of weeks with plan to step out in front of a car.  Also states "if I had a gun I would shoot my mom in the head".  Denies any hallucinations.  States he is also having sharp left-sided chest pain for the past couple of days with shortness of breath.  No vomiting or diarrhea.  No diaphoresis or dizziness.  States he has had a productive cough.  No fever.  No lower extremity swelling or pain.  Has been drinking alcohol today.  States he drinks alcohol every day and states it is "as much as I can get my hands on".  Denies any drug use.  States he is currently homeless.        Past Medical History:  Diagnosis Date   ASD (atrial septal defect)    Bacterial infection due to H. 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 Hx urethroplasty for urethral trauma in 2020 04/30/2019   Alcohol abuse with intoxication (HCC) 04/30/2019   Acute Femoral condyle fracture (HCC) 04/30/2019   Alcohol abuse 01/14/2018   Substance induced mood disorder (HCC) 01/14/2018    Past Surgical History:  Procedure  Laterality Date   ASD REPAIR     FOOT SURGERY Right    KNEE SURGERY Right     Prior to Admission medications   Not on File    Allergies Penicillins  No family history on file.  Social History Social History   Tobacco Use   Smoking status: Every Day    Types: Cigarettes   Smokeless tobacco: Never   Tobacco comments:    down to 2 cigarettes a day  Vaping Use   Vaping Use: Never used  Substance Use Topics   Alcohol use: Yes    Comment: Last consumed Friday 06/10/19, pt states he is "an alcoholic" and "wants to drink"   Drug use: Not Currently    Review of Systems Constitutional: No fever. Eyes: No visual changes. ENT: No sore throat. Cardiovascular: +chest pain. Respiratory: +shortness of breath. Gastrointestinal: No nausea, vomiting, diarrhea. Genitourinary: Negative for dysuria. Musculoskeletal: Negative for back pain. Skin: Negative for rash. Neurological: Negative for focal weakness or numbness.  ____________________________________________   PHYSICAL EXAM:  VITAL SIGNS: ED Triage Vitals  Enc Vitals Group     BP 10/15/20 2339 (!) 129/103     Pulse Rate 10/15/20 2338 86     Resp 10/15/20 2338 20     Temp 10/15/20 2338 97.8 F (36.6 C)     Temp Source 10/15/20 2338 Oral     SpO2 10/15/20  2338 98 %     Weight 10/15/20 2336 163 lb (73.9 kg)     Height 10/15/20 2336 5\' 6"  (1.676 m)     Head Circumference --      Peak Flow --      Pain Score 10/15/20 2336 8     Pain Loc --      Pain Edu? --      Excl. in GC? --    CONSTITUTIONAL: Alert and oriented and responds appropriately to questions.  Chronically ill-appearing and appears older than stated age, in no distress, intoxicated HEAD: Normocephalic EYES: Conjunctivae clear, pupils appear equal, EOM appear intact ENT: normal nose; moist mucous membranes NECK: Supple, normal ROM CARD: RRR; S1 and S2 appreciated; no murmurs, no clicks, no rubs, no gallops CHEST:  Chest wall is tender to palpation.  No  crepitus, ecchymosis, erythema, warmth, rash or other lesions present.   RESP: Normal chest excursion without splinting or tachypnea; breath sounds clear and equal bilaterally; no wheezes, no rhonchi, no rales, no hypoxia or respiratory distress, speaking full sentences ABD/GI: Normal bowel sounds; non-distended; soft, non-tender, no rebound, no guarding, no peritoneal signs, no hepatosplenomegaly BACK: The back appears normal EXT: Normal ROM in all joints; no deformity noted, no edema; no cyanosis, no calf tenderness or calf swelling SKIN: Normal color for age and race; warm; no rash on exposed skin NEURO: Moves all extremities equally PSYCH: Endorses SI and HI.  Denies hallucinations.  Does not appear to be responding to internal stimuli.  ____________________________________________   LABS (all labs ordered are listed, but only abnormal results are displayed)  Labs Reviewed  CBC - Abnormal; Notable for the following components:      Result Value   RDW 15.8 (*)    All other components within normal limits  ETHANOL - Abnormal; Notable for the following components:   Alcohol, Ethyl (B) 226 (*)    All other components within normal limits  RESP PANEL BY RT-PCR (FLU A&B, COVID) [id number]  COMPREHENSIVE METABOLIC PANEL  URINALYSIS, COMPLETE (UACMP) WITH MICROSCOPIC  URINE DRUG SCREEN, QUALITATIVE ([facility] ONLY)  TROPONIN I (HIGH SENSITIVITY)  TROPONIN I (HIGH SENSITIVITY)   ____________________________________________  EKG   EKG Interpretation  Date/Time:  Monday October 15 2020 23:45:06 EDT Ventricular Rate:  85 PR Interval:  154 QRS Duration: 96 QT Interval:  356 QTC Calculation: 423 R Axis:   110 Text Interpretation: Normal sinus rhythm Left posterior fascicular block Abnormal ECG Confirmed by 05-17-1983 [phone]) on 10/16/2020 12:25:06 AM        ____________________________________________  RADIOLOGY 10/18/2020 Smriti Barkow, personally viewed and evaluated these images (plain  radiographs) as part of my medical decision making, as well as reviewing the written report by the radiologist.  ED MD interpretation: Chest x-ray shows atelectasis but no infiltrate or edema.  Official radiology report(s): DG Chest Portable 1 View  Result Date: 10/16/2020 CLINICAL DATA:  Chest pain and shortness of breath. EXAM: PORTABLE CHEST 1 VIEW COMPARISON:  09/30/2020 FINDINGS: Shallow inspiration with linear atelectasis in the lung bases, progressing since prior study. Heart size and pulmonary vascularity are normal. No pleural effusions. No pneumothorax. Old fracture deformity of the midshaft right clavicle. Postoperative changes in the thoracolumbar spine. IMPRESSION: Linear atelectasis in the lung bases, progressing since prior study. Electronically Signed   By: 11/30/2020 M.D.   On: 10/16/2020 00:43    ____________________________________________   PROCEDURES  Procedure(s) performed (including Critical Care):  Procedures  ____________________________________________   INITIAL IMPRESSION /  ASSESSMENT AND PLAN / ED COURSE  As part of my medical decision making, I reviewed the following data within the electronic MEDICAL RECORD NUMBER Nursing notes reviewed and incorporated, Labs reviewed , EKG interpreted , Old EKG reviewed, Radiograph reviewed , A consult was requested and obtained from this/these consultant(s) Psychiatry, and Notes from prior ED visits         Patient here with SI, HI in the setting of substance abuse.  States he drinks alcohol every day.  I have placed him on a CIWA protocol.  EKG nonischemic.  Chest x-ray clear.  Chest pain seems atypical.  Doubt ACS, PE, dissection.  Will obtain cardiac labs.  Will consult TTS and psychiatry.  He is voluntary at this time.  ED PROGRESS  Patient seen by Gillermo Murdoch, NP with psychiatry.  They report patient has been here frequently for similar complaints and suspect that his SI is likely related to alcohol abuse  and homelessness.  Provided with outpatient resources.  Will discharge when clinically sober.  First troponin negative.  Repeat troponin pending.  3:10 AM  Pt currently sleeping comfortably.  3:44 AM  Pt's repeat troponin is negative, flat.  Will discharge home in the morning when clinically sober with outpatient resources.   At this time, I do not feel there is any life-threatening condition present. I have reviewed, interpreted and discussed all results (EKG, imaging, lab, urine as appropriate) and exam findings with patient/family. I have reviewed nursing notes and appropriate previous records.  I feel the patient is safe to be discharged home without further emergent workup and can continue workup as an outpatient as needed. Discussed usual and customary return precautions. Patient/family verbalize understanding and are comfortable with this plan.  Outpatient follow-up has been provided as needed. All questions have been answered.  ____________________________________________   FINAL CLINICAL IMPRESSION(S) / ED DIAGNOSES  Final diagnoses:  Suicidal ideation  Homicidal ideation  Alcohol abuse  Atypical chest pain     ED Discharge Orders     None       *Please note:  SAMAN UMSTEAD was evaluated in Emergency Department on 10/16/2020 for the symptoms described in the history of present illness. He was evaluated in the context of the global COVID-19 pandemic, which necessitated consideration that the patient might be at risk for infection with the SARS-CoV-2 virus that causes COVID-19. Institutional protocols and algorithms that pertain to the evaluation of patients at risk for COVID-19 are in a state of rapid change based on information released by regulatory bodies including the CDC and federal and state organizations. These policies and algorithms were followed during the patient's care in the ED.  Some ED evaluations and interventions may be delayed as a result of limited staffing  during and the pandemic.*   Note:  This document was prepared using Dragon voice recognition software and may include unintentional dictation errors.    Camaron Cammack, Layla Maw, DO 10/16/20 [phone]

## 2020-10-18 ENCOUNTER — Emergency Department: Payer: Medicaid Other

## 2020-10-18 ENCOUNTER — Encounter: Payer: Self-pay | Admitting: Emergency Medicine

## 2020-10-18 ENCOUNTER — Other Ambulatory Visit: Payer: Self-pay

## 2020-10-18 ENCOUNTER — Emergency Department
Admission: EM | Admit: 2020-10-18 | Discharge: 2020-10-18 | Disposition: A | Discharge status: Home / Self Care | Payer: Medicaid Other | Attending: Emergency Medicine | Admitting: Emergency Medicine

## 2020-10-18 DIAGNOSIS — R110 Nausea: Secondary | ICD-10-CM | POA: Diagnosis not present

## 2020-10-18 DIAGNOSIS — R079 Chest pain, unspecified: Secondary | ICD-10-CM | POA: Insufficient documentation

## 2020-10-18 DIAGNOSIS — Z5321 Procedure and treatment not carried out due to patient leaving prior to being seen by health care provider: Secondary | ICD-10-CM | POA: Insufficient documentation

## 2020-10-18 LAB — BASIC METABOLIC PANEL
Anion gap: 7 (ref 5–15)
BUN: <5 mg/dL — ABNORMAL LOW (ref 6–20)
CO2: 26 mmol/L (ref 22–32)
Calcium: 8.5 mg/dL — ABNORMAL LOW (ref 8.9–10.3)
Chloride: 108 mmol/L (ref 98–111)
Creatinine, Ser: 0.74 mg/dL (ref 0.61–1.24)
GFR, Estimated: >60 mL/min (ref >60)
Glucose, Bld: 97 mg/dL (ref 70–99)
Potassium: 3.8 mmol/L (ref 3.5–5.1)
Sodium: 141 mmol/L (ref 135–145)

## 2020-10-18 LAB — CBC
HCT: 49.6 % (ref 39.0–52.0)
Hemoglobin: 16.8 g/dL (ref 13.0–17.0)
MCH: 30.7 pg (ref 26.0–34.0)
MCHC: 33.9 g/dL (ref 30.0–36.0)
MCV: 90.7 fL (ref 80.0–100.0)
Platelets: 267 10*3/uL (ref 150–400)
RBC: 5.47 MIL/uL (ref 4.22–5.81)
RDW: 15.5 % (ref 11.5–15.5)
WBC: 5.3 10*3/uL (ref 4.0–10.5)
nRBC: 0 % (ref 0.0–0.2)

## 2020-10-18 LAB — TROPONIN I (HIGH SENSITIVITY): Troponin I (High Sensitivity): 3 ng/L (ref <18)

## 2020-10-18 NOTE — ED Notes (Signed)
Pt called, no answer, bathroom checked.

## 2020-10-18 NOTE — ED Notes (Signed)
EMS picked pt up from [facility], blew a 2.8, been drinking since last pm, c/o chest pain, refused vitals from EMS. Cp "worsens upon palpation."

## 2020-10-18 NOTE — ED Notes (Signed)
Called for xray 1500. No response.

## 2020-10-18 NOTE — ED Notes (Signed)
Pt called to be roomed, no answer  

## 2020-10-18 NOTE — ED Triage Notes (Addendum)
Pt via EMS pt c/o L sided chest pain since last night. Also, c/o nausea. Denies vomiting. Pt is extremely intoxicated during triage. Pt states he had 1 beer this AM. Pt is A&Ox4 and NAD.

## 2020-10-19 ENCOUNTER — Emergency Department: Payer: Medicaid Other

## 2020-10-19 ENCOUNTER — Emergency Department
Admission: EM | Admit: 2020-10-19 | Discharge: 2020-10-20 | Disposition: A | Discharge status: Home / Self Care | Payer: Medicaid Other | Attending: Emergency Medicine | Admitting: Emergency Medicine

## 2020-10-19 DIAGNOSIS — F10129 Alcohol abuse with intoxication, unspecified: Secondary | ICD-10-CM | POA: Diagnosis not present

## 2020-10-19 DIAGNOSIS — Y908 Blood alcohol level of 240 mg/100 ml or more: Secondary | ICD-10-CM | POA: Insufficient documentation

## 2020-10-19 DIAGNOSIS — R519 Headache, unspecified: Secondary | ICD-10-CM | POA: Insufficient documentation

## 2020-10-19 DIAGNOSIS — F319 Bipolar disorder, unspecified: Secondary | ICD-10-CM | POA: Insufficient documentation

## 2020-10-19 DIAGNOSIS — F1094 Alcohol use, unspecified with alcohol-induced mood disorder: Secondary | ICD-10-CM | POA: Diagnosis not present

## 2020-10-19 DIAGNOSIS — I10 Essential (primary) hypertension: Secondary | ICD-10-CM | POA: Insufficient documentation

## 2020-10-19 DIAGNOSIS — F17210 Nicotine dependence, cigarettes, uncomplicated: Secondary | ICD-10-CM | POA: Diagnosis not present

## 2020-10-19 DIAGNOSIS — F1010 Alcohol abuse, uncomplicated: Secondary | ICD-10-CM | POA: Diagnosis present

## 2020-10-19 DIAGNOSIS — R45851 Suicidal ideations: Secondary | ICD-10-CM | POA: Diagnosis present

## 2020-10-19 LAB — COMPREHENSIVE METABOLIC PANEL
ALT: 24 U/L (ref 0–44)
AST: 35 U/L (ref 15–41)
Albumin: 3.7 g/dL (ref 3.5–5.0)
Alkaline Phosphatase: 90 U/L (ref 38–126)
Anion gap: 8 (ref 5–15)
BUN: <5 mg/dL — ABNORMAL LOW (ref 6–20)
CO2: 24 mmol/L (ref 22–32)
Calcium: 8.6 mg/dL — ABNORMAL LOW (ref 8.9–10.3)
Chloride: 109 mmol/L (ref 98–111)
Creatinine, Ser: 0.76 mg/dL (ref 0.61–1.24)
GFR, Estimated: >60 mL/min (ref >60)
Glucose, Bld: 95 mg/dL (ref 70–99)
Potassium: 3.7 mmol/L (ref 3.5–5.1)
Sodium: 141 mmol/L (ref 135–145)
Total Bilirubin: 0.6 mg/dL (ref 0.3–1.2)
Total Protein: 7.3 g/dL (ref 6.5–8.1)

## 2020-10-19 LAB — TROPONIN I (HIGH SENSITIVITY): Troponin I (High Sensitivity): 3 ng/L (ref <18)

## 2020-10-19 LAB — ETHANOL: Alcohol, Ethyl (B): 290 mg/dL — ABNORMAL HIGH (ref <10)
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
## 2020-10-20 LAB — CBC WITH DIFFERENTIAL/PLATELET
Abs Immature Granulocytes: 0.03 10*3/uL (ref 0.00–0.07)
Basophils Absolute: 0.1 10*3/uL (ref 0.0–0.1)
Basophils Relative: 1 %
Eosinophils Absolute: 0.1 10*3/uL (ref 0.0–0.5)
Eosinophils Relative: 1 %
HCT: 42.7 % (ref 39.0–52.0)
Hemoglobin: 14.5 g/dL (ref 13.0–17.0)
Immature Granulocytes: 0 %
Lymphocytes Relative: 33 %
Lymphs Abs: 2.5 10*3/uL (ref 0.7–4.0)
MCH: 31.8 pg (ref 26.0–34.0)
MCHC: 34 g/dL (ref 30.0–36.0)
MCV: 93.6 fL (ref 80.0–100.0)
Monocytes Absolute: 0.6 10*3/uL (ref 0.1–1.0)
Monocytes Relative: 8 %
Neutro Abs: 4.3 10*3/uL (ref 1.7–7.7)
Neutrophils Relative %: 57 %
Platelets: 229 10*3/uL (ref 150–400)
RBC: 4.56 MIL/uL (ref 4.22–5.81)
RDW: 15.7 % — ABNORMAL HIGH (ref 11.5–15.5)
WBC: 7.7 10*3/uL (ref 4.0–10.5)
nRBC: 0 % (ref 0.0–0.2)

## 2020-10-20 NOTE — ED Provider Notes (Signed)
-----------------------------------------   6:07 AM on 10/20/2020 ----------------------------------------- Patient had been evaluated by psychiatry and deemed to not represent a threat to himself or others.  On reevaluation, he is clinically sober and now denies any suicidal or homicidal ideation.  IVC was rescinded and he is appropriate for discharge home, was provided outpatient resources.  He was counseled to return to the ED for new worsening symptoms, patient agrees with plan.   Chesley Noon, MD 10/20/20 [phone]
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
## 2020-10-22 ENCOUNTER — Other Ambulatory Visit: Payer: Self-pay

## 2020-10-22 ENCOUNTER — Emergency Department
Admission: EM | Admit: 2020-10-22 | Discharge: 2020-10-23 | Disposition: A | Discharge status: Home / Self Care | Payer: Medicaid Other | Attending: Emergency Medicine | Admitting: Emergency Medicine

## 2020-10-22 ENCOUNTER — Encounter: Payer: Self-pay | Admitting: *Deleted

## 2020-10-22 DIAGNOSIS — Z765 Malingerer [conscious simulation]: Secondary | ICD-10-CM

## 2020-10-22 DIAGNOSIS — F319 Bipolar disorder, unspecified: Secondary | ICD-10-CM | POA: Diagnosis present

## 2020-10-22 DIAGNOSIS — F10929 Alcohol use, unspecified with intoxication, unspecified: Secondary | ICD-10-CM

## 2020-10-22 DIAGNOSIS — Y907 Blood alcohol level of 200-239 mg/100 ml: Secondary | ICD-10-CM | POA: Diagnosis not present

## 2020-10-22 DIAGNOSIS — I10 Essential (primary) hypertension: Secondary | ICD-10-CM | POA: Diagnosis not present

## 2020-10-22 DIAGNOSIS — F1994 Other psychoactive substance use, unspecified with psychoactive substance-induced mood disorder: Secondary | ICD-10-CM | POA: Diagnosis present

## 2020-10-22 DIAGNOSIS — F17210 Nicotine dependence, cigarettes, uncomplicated: Secondary | ICD-10-CM | POA: Insufficient documentation

## 2020-10-22 DIAGNOSIS — R4589 Other symptoms and signs involving emotional state: Secondary | ICD-10-CM

## 2020-10-22 DIAGNOSIS — F1024 Alcohol dependence with alcohol-induced mood disorder: Secondary | ICD-10-CM | POA: Diagnosis not present

## 2020-10-22 DIAGNOSIS — F1094 Alcohol use, unspecified with alcohol-induced mood disorder: Secondary | ICD-10-CM | POA: Diagnosis present

## 2020-10-22 DIAGNOSIS — R45851 Suicidal ideations: Secondary | ICD-10-CM

## 2020-10-22 DIAGNOSIS — F10129 Alcohol abuse with intoxication, unspecified: Secondary | ICD-10-CM | POA: Diagnosis present

## 2020-10-22 DIAGNOSIS — F1010 Alcohol abuse, uncomplicated: Secondary | ICD-10-CM | POA: Diagnosis not present

## 2020-10-22 DIAGNOSIS — W19XXXA Unspecified fall, initial encounter: Secondary | ICD-10-CM | POA: Diagnosis present

## 2020-10-22 LAB — COMPREHENSIVE METABOLIC PANEL
ALT: 25 U/L (ref 0–44)
AST: 36 U/L (ref 15–41)
Albumin: 3.9 g/dL (ref 3.5–5.0)
Alkaline Phosphatase: 86 U/L (ref 38–126)
Anion gap: 12 (ref 5–15)
BUN: 12 mg/dL (ref 6–20)
CO2: 24 mmol/L (ref 22–32)
Calcium: 9.2 mg/dL (ref 8.9–10.3)
Chloride: 102 mmol/L (ref 98–111)
Creatinine, Ser: 0.79 mg/dL (ref 0.61–1.24)
GFR, Estimated: >60 mL/min (ref >60)
Glucose, Bld: 93 mg/dL (ref 70–99)
Potassium: 3.7 mmol/L (ref 3.5–5.1)
Sodium: 138 mmol/L (ref 135–145)
Total Bilirubin: 0.8 mg/dL (ref 0.3–1.2)
Total Protein: 7.7 g/dL (ref 6.5–8.1)

## 2020-10-22 LAB — SALICYLATE LEVEL: Salicylate Lvl: <7 mg/dL — ABNORMAL LOW (ref 7.0–30.0)

## 2020-10-22 LAB — CBC
HCT: 49.1 % (ref 39.0–52.0)
Hemoglobin: 16.9 g/dL (ref 13.0–17.0)
MCH: 31.2 pg (ref 26.0–34.0)
MCHC: 34.4 g/dL (ref 30.0–36.0)
MCV: 90.6 fL (ref 80.0–100.0)
Platelets: 258 10*3/uL (ref 150–400)
RBC: 5.42 MIL/uL (ref 4.22–5.81)
RDW: 15.7 % — ABNORMAL HIGH (ref 11.5–15.5)
WBC: 10.5 10*3/uL (ref 4.0–10.5)
nRBC: 0 % (ref 0.0–0.2)

## 2020-10-22 LAB — ACETAMINOPHEN LEVEL: Acetaminophen (Tylenol), Serum: <10 ug/mL — ABNORMAL LOW (ref 10–30)

## 2020-10-22 LAB — ETHANOL: Alcohol, Ethyl (B): 235 mg/dL — ABNORMAL HIGH (ref <10)

## 2020-10-22 NOTE — ED Notes (Signed)
Jeans Tee shirt Black back pack White socks Black sneakers  Green plaid underwear

## 2020-10-22 NOTE — ED Triage Notes (Signed)
Pt brought in by [employer].  Pt is homeless and reports he is SI.  Denies HI.  Pt denies drug use.   Pt admits to etoh use.  Pt alert

## 2020-10-22 NOTE — ED Provider Notes (Addendum)
[facility] Emergency Department Provider Note ____________________________________________   Event Date/Time   First MD Initiated Contact with Patient 10/22/20 2056     (approximate)  I have reviewed the triage vital signs and the nursing notes.  HISTORY  Chief Complaint Psychiatric Evaluation   HPI John [name] is a 40 y.o. malewho presents to the ED for evaluation of his mental health.   Chart review indicates history of alcoholism. Patient was just here 2 days ago for similar presentation of suicidal ideations in the setting of his drinking.  He was cleared by psychiatry and discharged home.  Patient returns to the ED for evaluation of recurrent suicidality in the setting of his alcoholism.  He reports wanting to kill himself by jumping in front of traffic.  Denies any overdose or attempts at suicide yet.  Denies recent illnesses or events since he was just here.  Further reports that he is homeless and has no vertigo.  Past Medical History:  Diagnosis Date   ASD (atrial septal defect)    Bacterial infection due to H. 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 Hx urethroplasty for urethral trauma in 2020 04/30/2019   Alcohol abuse with intoxication (HCC) 04/30/2019   Acute Femoral condyle fracture (HCC) 04/30/2019   Alcohol abuse 01/14/2018   Substance induced mood disorder (HCC) 01/14/2018    Past Surgical History:  Procedure Laterality Date   ASD REPAIR     FOOT SURGERY Right    KNEE SURGERY Right     Prior to Admission medications   Not on File     Allergies Penicillins  No family history on file.  Social History Social History   Tobacco Use   Smoking status: Every Day    Types: Cigarettes   Smokeless tobacco: Never   Tobacco comments:    down to 2 cigarettes a day  Vaping Use   Vaping Use: Never used  Substance Use Topics   Alcohol use: Yes    Comment: Last consumed Friday 06/10/19, pt states he is "an alcoholic" and "wants to drink"   Drug use: Not Currently    Review of Systems  Constitutional: No fever/chills Eyes: No visual changes. ENT: No sore throat. Cardiovascular: Denies chest pain. Respiratory: Denies shortness of breath. Gastrointestinal: No abdominal pain.  No nausea, no vomiting.  No diarrhea.  No constipation. Genitourinary: Negative for dysuria. Musculoskeletal: Negative for back pain. Skin: Negative for rash. Neurological: Negative for headaches, focal weakness or numbness.  ____________________________________________   PHYSICAL EXAM:  VITAL SIGNS: Vitals:   10/22/20 2029  BP: 123/83  Pulse: (!) 114  Resp: 20  Temp: 97.8 F (36.6 C)  SpO2: 96%     Constitutional: Alert and oriented.  Clinically intoxicated without distress.  Watching TV. Eyes: Conjunctivae are normal. PERRL. EOMI. Head: Atraumatic. Nose: No congestion/rhinnorhea. Mouth/Throat: Mucous membranes are moist.  Oropharynx non-erythematous. Neck: No stridor. No cervical spine tenderness to palpation. Cardiovascular: Normal rate, regular rhythm. Grossly normal heart sounds.  Good peripheral circulation. Respiratory: Normal respiratory effort.  No retractions. Lungs  CTAB. Gastrointestinal: Soft , nondistended, nontender to palpation. No CVA tenderness. Musculoskeletal: No lower extremity tenderness nor edema.  No joint effusions. No signs of acute trauma. Neurologic:  Normal speech and language. No gross focal neurologic deficits are appreciated. No gait instability noted. Skin:  Skin is warm, dry and intact. No rash  noted. Psychiatric: Mood and affect are normal. Speech and behavior are normal.  ____________________________________________   LABS (all labs ordered are listed, but only abnormal results are displayed)  Labs Reviewed  ETHANOL - Abnormal; Notable for the following components:      Result Value   Alcohol, Ethyl (B) 235 (*)    All other components within normal limits  SALICYLATE LEVEL - Abnormal; Notable for the following components:   Salicylate Lvl <7.0 (*)    All other components within normal limits  ACETAMINOPHEN LEVEL - Abnormal; Notable for the following components:   Acetaminophen (Tylenol), Serum <10 (*)    All other components within normal limits  CBC - Abnormal; Notable for the following components:   RDW 15.7 (*)    All other components within normal limits  COMPREHENSIVE METABOLIC PANEL  URINE DRUG SCREEN, QUALITATIVE ([facility] ONLY)   ____________________________________________  12 Lead EKG   ____________________________________________  RADIOLOGY  ED MD interpretation:    Official radiology report(s): No results found.  ____________________________________________   PROCEDURES and INTERVENTIONS  Procedure(s) performed (including Critical Care):  Procedures  Medications - No data to display  ____________________________________________   MDM / ED COURSE   40 year old male presents to the ED with recurrent vague suicidality in the setting of his alcohol intoxication requiring psychiatric evaluation and sobering up.  No evidence of medical pathology to preclude psychiatric evaluation and disposition.  Clinical Course as of 10/22/20 2220  Mon Oct 22, 2020  2219 The patient has been placed in psychiatric observation due to the need to provide a safe environment for the patient while obtaining psychiatric consultation and evaluation, as well as ongoing medical and medication management to treat the patient's condition.  The patient has not been placed  under full IVC at this time.  [DS]    Clinical Course User Index [DS] Delton Prairie, MD    ____________________________________________   FINAL CLINICAL IMPRESSION(S) / ED DIAGNOSES  Final diagnoses:  Alcoholic intoxication with complication (HCC)  Suicidal behavior without attempted self-injury     ED Discharge Orders     None        Bilal Manzer   Note:  This document was prepared using Dragon voice recognition software and may include unintentional dictation errors.    Delton Prairie, MD 10/22/20 2219    Delton Prairie, MD 10/22/20 2220

## 2020-10-23 DIAGNOSIS — F1010 Alcohol abuse, uncomplicated: Secondary | ICD-10-CM

## 2020-10-23 DIAGNOSIS — F10129 Alcohol abuse with intoxication, unspecified: Secondary | ICD-10-CM

## 2020-10-23 NOTE — ED Notes (Signed)
Pt remains sleeping, will obtain VS when awakens.

## 2020-10-23 NOTE — ED Notes (Signed)
Breakfast placed on bed, pt continues to sleep

## 2020-10-23 NOTE — ED Notes (Signed)
Pt ambulated to bathroom 

## 2020-10-23 NOTE — ED Provider Notes (Signed)
The patient has been evaluated at bedside by Psych.  Patient is clinically stable.  Not felt to be a danger to self or others.  No SI or Hi.  No indication for inpatient psychiatric admission at this time.  Appropriate for continued outpatient therapy.    Willy Eddy, MD 10/23/20 [phone]

## 2020-10-23 NOTE — Consult Note (Signed)
[facility] Face-to-Face Psychiatry Consult   Reason for Consult: Psychiatric Evaluation Referring Physician: Dr. Katrinka Blazing Patient Identification: John [name] MRN:  [id number] Principal Diagnosis: <principal problem not specified> Diagnosis:  Active Problems:   Alcohol abuse   Substance induced mood disorder (HCC)   Fall   Alcohol abuse with intoxication (HCC)   Bipolar 1 disorder (HCC)   Malingerer   Suicidal ideation   Alcohol-induced mood disorder (HCC)   Total Time spent with patient: 1 hour  Subjective: "I am disabled and homeless. I got approved for my disability." John [name] is a 40 y.o. male patient presented to [facility] ED via law enforcement voluntary. The patient has been seen in the ER for the same presenting problems. The patient's BAL is 235 mg/dl.  The patient was seen face-to-face by this provider; the chart was reviewed and consulted with Dr. Marisa Severin on 10/23/2020 due to the patient's care. It was discussed with the EDP that the patient does not meet the criteria to be admitted to the psychiatric inpatient unit.  On evaluation, the patient is alert and oriented x 3, inebriated but calm and cooperative, and mood-congruent with affect.  The patient does not appear to be responding to internal or external stimuli. Neither is the patient presenting with any delusional thinking. The patient admits to [facility] but denies HI/A/V/H. The patient is presenting with more malingering behavior due to his homelessness and substance use diagnosis. The patient is not presenting with any psychotic or paranoid behaviors.   The patient is well known to this er. He is currently homeless and states that he is suicidal for secondary gain. The patient has presented to the er 4x this week and several times the week before.  He was admitted to the [facility] and never followed up with any recommendations. He was asked to leave a shelter due to intoxication and moved in with his cousin. Ultimately, that did not work  out, and he is currently homeless.  At this time, the patient is psych cleared. The patient is malingering and does not meet the criteria.  The patient's ongoing endorsement of suicidal ideation shows clear evidence of secondary gain of unmet needs for housing, which is representative of limited and often- maladaptive coping skills rather than an indicator of imminent risk of death.  Evidence indicates that subsequent suicide attempts by patients who made contingent suicide threats (defined as threatened suicide or exaggerated suicidality) are uncommon in both groups.  Hospitalization should not be used as a substitute for social services, substance abuse treatment, and legal assistance for patients who commit suicide threats (Characteristics and six-month outcomes of patients who use suicide threats to seek hospital admission.  (1966). Psychiatric Services, 47(8), [phone]. (DOI: 10.1176/ps.47.8.871).   The patient has not benefited from past hospitalization under similar circumstances regarding suicide risk modification or improvement in mental health or social conditions.  Patients repeated use of emergency department and inpatient services instead of recommended outpatient follow-up is ineffective in helping progress.  Inpatient hospitalization is not indicated, and the patient is refusing interventions that the clinical care team has offered in the emergency department, and prior inpatient clinical care team has contributed to mitigate the risk of self-harm, other than allowing [place] to observe the patient to sobriety or behavior   Recommendations:  Patient is psychiatrically cleared. Discharge when medically cleared.  HPI: Per Dr. Katrinka Blazing, John [name] is a 40 y.o. malewho presents to the ED for evaluation of his mental health.  Chart review indicates history  of alcoholism. Patient was just here 2 days ago for similar presentation of suicidal ideations in the setting of his drinking.  He was cleared by  psychiatry and discharged home. Patient returns to the ED for evaluation of recurrent suicidality in the setting of his alcoholism.  He reports wanting to kill himself by jumping in front of traffic.  Denies any overdose or attempts at suicide yet.  Denies recent illnesses or events since he was just here.  Further reports that he is homeless and has no vertigo.  Past Psychiatric History: History reviewed. No pertinent past psychiatric history  Risk to Self:   Risk to Others:   Prior Inpatient Therapy:   Prior Outpatient Therapy:    Past Medical History:  Past Medical History:  Diagnosis Date   ASD (atrial septal defect)    Bacterial infection due to H. pylori    Hypertension    Kidney stones     Past Surgical History:  Procedure Laterality Date   ASD REPAIR     FOOT SURGERY Right    KNEE SURGERY Right    Family History: No family history on file. Family Psychiatric  History:  Social History:  Social History   Substance and Sexual Activity  Alcohol Use Yes   Comment: Last consumed Friday 06/10/19, pt states he is "an alcoholic" and "wants to drink"     Social History   Substance and Sexual Activity  Drug Use Not Currently    Social History   Socioeconomic History   Marital status: Single    Spouse name: Not on file   Number of children: Not on file   Years of education: Not on file   Highest education level: Not on file  Occupational History   Not on file  Tobacco Use   Smoking status: Every Day    Types: Cigarettes   Smokeless tobacco: Never   Tobacco comments:    down to 2 cigarettes a day  Vaping Use   Vaping Use: Never used  Substance and Sexual Activity   Alcohol use: Yes    Comment: Last consumed Friday 06/10/19, pt states he is "an alcoholic" and "wants to drink"   Drug use: Not Currently   Sexual activity: Yes    Birth control/protection: None  Other Topics Concern   Not on file  Social History Narrative   Not on file   Social Determinants of  Health   Financial Resource Strain: Not on file  Food Insecurity: Not on file  Transportation Needs: Not on file  Physical Activity: Not on file  Stress: Not on file  Social Connections: Not on file   Additional Social History:    Allergies:   Allergies  Allergen Reactions   Penicillins     Did it involve swelling of the face/tongue/throat, SOB, or low BP? No Did it involve sudden or severe rash/hives, skin peeling, or any reaction on the inside of your mouth or nose? No Did you need to seek medical attention at a hospital or doctor's office? Unknown When did it last happen?       If all above answers are "NO", may proceed with cephalosporin use.    Labs:  Results for orders placed or performed during the hospital encounter of 10/22/20 (from the past 48 hour(s))  Comprehensive metabolic panel     Status: None   Collection Time: 10/22/20  8:34 PM  Result Value Ref Range   Sodium 138 135 - 145 mmol/L   Potassium 3.7  3.5 - 5.1 mmol/L   Chloride 102 98 - 111 mmol/L   CO2 24 22 - 32 mmol/L   Glucose, Bld 93 70 - 99 mg/dL    Comment: Glucose reference range applies only to samples taken after fasting for at least 8 hours.   BUN 12 6 - 20 mg/dL   Creatinine, Ser 1.610.79 0.61 - 1.24 mg/dL   Calcium 9.2 8.9 - 09.610.3 mg/dL   Total Protein 7.7 6.5 - 8.1 g/dL   Albumin 3.9 3.5 - 5.0 g/dL   AST 36 15 - 41 U/L   ALT 25 0 - 44 U/L   Alkaline Phosphatase 86 38 - 126 U/L   Total Bilirubin 0.8 0.3 - 1.2 mg/dL   GFR, Estimated >04>60 >54>60 mL/min    Comment: (NOTE) Calculated using the CKD-EPI Creatinine Equation (2021)    Anion gap 12 5 - 15    Comment: Performed at [facility], [address] [place], [place] [address]  Ethanol     Status: Abnormal   Collection Time: 10/22/20  8:34 PM  Result Value Ref Range   Alcohol, Ethyl (B) 235 (H) <10 mg/dL    Comment: (NOTE) Lowest detectable limit for serum alcohol is 10 mg/dL.  For medical purposes only. Performed at [facility], [address] [place], [place] [address]   Salicylate level     Status: Abnormal   Collection Time: 10/22/20  8:34 PM  Result Value Ref Range   Salicylate Lvl <7.0 (L) 7.0 - 30.0 mg/dL    Comment: Performed at [facility], [address] [place], [place] [address]  Acetaminophen level     Status: Abnormal   Collection Time: 10/22/20  8:34 PM  Result Value Ref Range   Acetaminophen (Tylenol), Serum <10 (L) 10 - 30 ug/mL    Comment: (NOTE) Therapeutic concentrations vary significantly. A range of 10-30 ug/mL  may be an effective concentration for many patients. However, some  are best treated at concentrations outside of this range. Acetaminophen concentrations >150 ug/mL at 4 hours after ingestion  and >50 ug/mL at 12 hours after ingestion are often associated with  toxic reactions.  Performed at [facility], [address] [place], [place] [address]   cbc     Status: Abnormal   Collection Time: 10/22/20  8:34 PM  Result Value Ref Range   WBC 10.5 4.0 - 10.5 K/uL   RBC 5.42 4.22 - 5.81 MIL/uL   Hemoglobin 16.9 13.0 - 17.0 g/dL   HCT 57.849.1 46.939.0 - 62.952.0 %   MCV 90.6 80.0 - 100.0 fL   MCH 31.2 26.0 - 34.0 pg   MCHC 34.4 30.0 - 36.0 g/dL   RDW 52.815.7 (H) 41.311.5 - 24.415.5 %   Platelets 258 150 - 400 K/uL   nRBC 0.0 0.0 - 0.2 %    Comment: Performed at [facility], [address] [place], [place] [address]    No current facility-administered medications for this encounter.   No current outpatient medications on file.    Musculoskeletal: Strength & Muscle Tone: within normal limits Gait & Station: normal Patient leans: N/A  Psychiatric Specialty Exam:  Presentation  General Appearance: Bizarre  Eye Contact:Minimal  Speech:Slurred  Speech Volume:Decreased  Handedness:Right   Mood and Affect  Mood:Euthymic  Affect:Blunt; Flat; Inappropriate   Thought Process  Thought Processes:Disorganized  Descriptions of  Associations:Intact  Orientation:Full (Time, Place and Person)  Thought Content:Illogical  History of Schizophrenia/Schizoaffective disorder:No  Duration of  Psychotic Symptoms:No data recorded Hallucinations:Hallucinations: None  Ideas of Reference:None  Suicidal Thoughts:Suicidal Thoughts: Yes, Passive SI Passive Intent and/or Plan: Without Intent; Without Plan  Homicidal Thoughts:Homicidal Thoughts: No   Sensorium  Memory:Immediate Fair; Recent Fair; Remote Fair  Judgment:Poor  Insight:Poor   Executive Functions  Concentration:Fair  Attention Span:Fair  Recall:Fair  Fund of Knowledge:Fair  Language:Fair   Psychomotor Activity  Psychomotor Activity:Psychomotor Activity: Normal   Assets  Assets:Desire for Improvement; Housing; Resilience; Social Support   Sleep  Sleep:Sleep: Fair   Physical Exam: Physical Exam Vitals and nursing note reviewed.  Constitutional:      Appearance: Normal appearance.  HENT:     Head: Normocephalic and atraumatic.     Nose: Nose normal.     Mouth/Throat:     Mouth: Mucous membranes are moist.  Cardiovascular:     Rate and Rhythm: Tachycardia present.  Pulmonary:     Effort: Pulmonary effort is normal.  Musculoskeletal:        General: Normal range of motion.     Cervical back: Normal range of motion and neck supple.  Neurological:     General: No focal deficit present.     Mental Status: He is alert and oriented to person, place, and time.  Psychiatric:        Attention and Perception: Attention and perception normal.        Mood and Affect: Affect is blunt and flat.        Speech: Speech is slurred.        Behavior: Behavior is slowed and withdrawn. Behavior is cooperative.        Thought Content: Thought content includes suicidal ideation.        Cognition and Memory: Cognition is impaired.        Judgment: Judgment is impulsive and inappropriate.   Review of Systems  Psychiatric/Behavioral:  Positive for  substance abuse and suicidal ideas.   All other systems reviewed and are negative. Blood pressure 123/83, pulse (!) 114, temperature 97.8 F (36.6 C), temperature source Oral, resp. rate 20, height 5\' 9"  (1.753 m), weight 73.9 kg, SpO2 96 %. Body mass index is 24.07 kg/m.  Treatment Plan Summary: Plan the patient does not meet the criteria to be admitted to the psychiatric inpatient unit.   Disposition: No evidence of imminent risk to self or others at present.   Patient does not meet criteria for psychiatric inpatient admission. Supportive therapy provided about ongoing stressors. Refer to IOP. Discussed crisis plan, support from social network, calling 911, coming to the Emergency Department, and calling Suicide Hotline.  , NP 10/23/2020 1:17 AM
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
## 2020-10-24 ENCOUNTER — Other Ambulatory Visit: Payer: Self-pay

## 2020-10-24 ENCOUNTER — Emergency Department
Admission: EM | Admit: 2020-10-24 | Discharge: 2020-10-25 | Disposition: A | Discharge status: Home / Self Care | Payer: Medicaid Other | Attending: Emergency Medicine | Admitting: Emergency Medicine

## 2020-10-24 DIAGNOSIS — F319 Bipolar disorder, unspecified: Secondary | ICD-10-CM | POA: Diagnosis not present

## 2020-10-24 DIAGNOSIS — F10120 Alcohol abuse with intoxication, uncomplicated: Secondary | ICD-10-CM | POA: Diagnosis not present

## 2020-10-24 DIAGNOSIS — R440 Auditory hallucinations: Secondary | ICD-10-CM | POA: Diagnosis not present

## 2020-10-24 DIAGNOSIS — F1914 Other psychoactive substance abuse with psychoactive substance-induced mood disorder: Secondary | ICD-10-CM | POA: Diagnosis present

## 2020-10-24 DIAGNOSIS — Z20822 Contact with and (suspected) exposure to covid-19: Secondary | ICD-10-CM | POA: Insufficient documentation

## 2020-10-24 DIAGNOSIS — F1010 Alcohol abuse, uncomplicated: Secondary | ICD-10-CM | POA: Diagnosis not present

## 2020-10-24 DIAGNOSIS — Y905 Blood alcohol level of 100-119 mg/100 ml: Secondary | ICD-10-CM | POA: Insufficient documentation

## 2020-10-24 DIAGNOSIS — R45851 Suicidal ideations: Secondary | ICD-10-CM

## 2020-10-24 DIAGNOSIS — F17210 Nicotine dependence, cigarettes, uncomplicated: Secondary | ICD-10-CM | POA: Diagnosis not present

## 2020-10-24 DIAGNOSIS — F1994 Other psychoactive substance use, unspecified with psychoactive substance-induced mood disorder: Secondary | ICD-10-CM | POA: Diagnosis present

## 2020-10-24 DIAGNOSIS — R4589 Other symptoms and signs involving emotional state: Secondary | ICD-10-CM

## 2020-10-24 DIAGNOSIS — F1094 Alcohol use, unspecified with alcohol-induced mood disorder: Secondary | ICD-10-CM | POA: Diagnosis present

## 2020-10-24 DIAGNOSIS — F1014 Alcohol abuse with alcohol-induced mood disorder: Secondary | ICD-10-CM | POA: Diagnosis not present

## 2020-10-24 DIAGNOSIS — I10 Essential (primary) hypertension: Secondary | ICD-10-CM | POA: Diagnosis not present

## 2020-10-24 DIAGNOSIS — Z765 Malingerer [conscious simulation]: Secondary | ICD-10-CM

## 2020-10-24 DIAGNOSIS — Z79899 Other long term (current) drug therapy: Secondary | ICD-10-CM | POA: Insufficient documentation

## 2020-10-24 DIAGNOSIS — F10129 Alcohol abuse with intoxication, unspecified: Secondary | ICD-10-CM | POA: Diagnosis present

## 2020-10-24 LAB — COMPREHENSIVE METABOLIC PANEL
ALT: 25 U/L (ref 0–44)
AST: 35 U/L (ref 15–41)
Albumin: 3.8 g/dL (ref 3.5–5.0)
Alkaline Phosphatase: 72 U/L (ref 38–126)
Anion gap: 7 (ref 5–15)
BUN: 6 mg/dL (ref 6–20)
CO2: 27 mmol/L (ref 22–32)
Calcium: 8.7 mg/dL — ABNORMAL LOW (ref 8.9–10.3)
Chloride: 105 mmol/L (ref 98–111)
Creatinine, Ser: 0.71 mg/dL (ref 0.61–1.24)
GFR, Estimated: >60 mL/min (ref >60)
Glucose, Bld: 77 mg/dL (ref 70–99)
Potassium: 3.9 mmol/L (ref 3.5–5.1)
Sodium: 139 mmol/L (ref 135–145)
Total Bilirubin: 0.6 mg/dL (ref 0.3–1.2)
Total Protein: 7.2 g/dL (ref 6.5–8.1)

## 2020-10-24 LAB — CBC
HCT: 46.2 % (ref 39.0–52.0)
Hemoglobin: 16 g/dL (ref 13.0–17.0)
MCH: 31.8 pg (ref 26.0–34.0)
MCHC: 34.6 g/dL (ref 30.0–36.0)
MCV: 91.8 fL (ref 80.0–100.0)
Platelets: 233 10*3/uL (ref 150–400)
RBC: 5.03 MIL/uL (ref 4.22–5.81)
RDW: 15.9 % — ABNORMAL HIGH (ref 11.5–15.5)
WBC: 5.4 10*3/uL (ref 4.0–10.5)
nRBC: 0 % (ref 0.0–0.2)

## 2020-10-24 LAB — RESP PANEL BY RT-PCR (FLU A&B, COVID) ARPGX2
Influenza A by PCR: NEGATIVE
Influenza B by PCR: NEGATIVE
SARS Coronavirus 2 by RT PCR: NEGATIVE

## 2020-10-24 LAB — URINE DRUG SCREEN, QUALITATIVE (ARMC ONLY)
Amphetamines, Ur Screen: NOT DETECTED
Barbiturates, Ur Screen: NOT DETECTED
Benzodiazepine, Ur Scrn: POSITIVE — AB
Cannabinoid 50 Ng, Ur ~~LOC~~: POSITIVE — AB
Cocaine Metabolite,Ur ~~LOC~~: NOT DETECTED
MDMA (Ecstasy)Ur Screen: NOT DETECTED
Methadone Scn, Ur: NOT DETECTED
Opiate, Ur Screen: NOT DETECTED
Phencyclidine (PCP) Ur S: NOT DETECTED
Tricyclic, Ur Screen: NOT DETECTED

## 2020-10-24 LAB — ETHANOL: Alcohol, Ethyl (B): 112 mg/dL — ABNORMAL HIGH (ref <10)

## 2020-10-24 LAB — ACETAMINOPHEN LEVEL: Acetaminophen (Tylenol), Serum: <10 ug/mL — ABNORMAL LOW (ref 10–30)

## 2020-10-24 LAB — SALICYLATE LEVEL: Salicylate Lvl: <7 mg/dL — ABNORMAL LOW (ref 7.0–30.0)

## 2020-10-24 MED ORDER — THIAMINE HCL 100 MG PO TABS
100.0000 mg | ORAL_TABLET | Freq: Every day | ORAL | Status: DC
  Administered 2020-10-24 – 2020-10-25 (×2): 100 mg via ORAL
  Filled 2020-10-24 (×2): qty 1

## 2020-10-24 MED ORDER — LORAZEPAM 2 MG PO TABS: 0.0000 mg | ORAL_TABLET | Freq: Two times a day (BID) | ORAL | Status: DC

## 2020-10-24 MED ORDER — THIAMINE HCL 100 MG/ML IJ SOLN: 100.0000 mg | Freq: Every day | INTRAMUSCULAR | Status: DC

## 2020-10-24 MED ORDER — LORAZEPAM 2 MG/ML IJ SOLN: 0.0000 mg | Freq: Four times a day (QID) | INTRAMUSCULAR | Status: DC

## 2020-10-24 MED ORDER — LORAZEPAM 2 MG PO TABS
0.0000 mg | ORAL_TABLET | Freq: Four times a day (QID) | ORAL | Status: DC
  Administered 2020-10-24: 1 mg via ORAL
  Filled 2020-10-24: qty 1

## 2020-10-24 MED ORDER — LORAZEPAM 2 MG/ML IJ SOLN
0.0000 mg | Freq: Two times a day (BID) | INTRAMUSCULAR | Status: DC
Start: 2020-10-27 — End: 2020-10-25

## 2020-10-24 NOTE — ED Notes (Signed)
Pt calm and cooperative in triage.

## 2020-10-24 NOTE — ED Notes (Signed)
Pt given meal tray.

## 2020-10-24 NOTE — ED Notes (Signed)
IVC, pend psych consult 

## 2020-10-24 NOTE — ED Triage Notes (Signed)
Pt comes voluntarily with BPD for SI/HI and hearing voices. States that he wants to jump off a bridge. Also states that he wants to kill his mom. Also states that he is hearing voices. Drinks on a daily basis.   Per BPD, pt needs to call with his attorney to check on disability and speak to a Child psychotherapist.   Friend's number Mellody Dance [phone]

## 2020-10-24 NOTE — ED Notes (Signed)
Pt given PO fluid per request.

## 2020-10-24 NOTE — ED Notes (Signed)
Called dietary for meal tray

## 2020-10-24 NOTE — ED Notes (Signed)
Last drink was yesterday. Pt denies detox symptoms at this time. Appears CIWA WNL.
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
## 2020-10-25 DIAGNOSIS — F1094 Alcohol use, unspecified with alcohol-induced mood disorder: Secondary | ICD-10-CM | POA: Diagnosis not present
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
## 2020-10-25 NOTE — ED Notes (Signed)
IVC PAPERS  RESCINDED  PER  DR  Neale Burly MD  INFORMED  ALLY  RN

## 2020-10-25 NOTE — BH Assessment (Signed)
Writer spoke with the patient to complete an updated/reassessment. Patient denies SI/HI and AV/H. 

## 2020-10-25 NOTE — ED Notes (Signed)
Pt provided with PO fluids per request. Pt moved to different bed. Pt ambulatory with steady gait, no distress noted.

## 2020-10-25 NOTE — ED Notes (Signed)
Pt given breakfast.

## 2020-10-25 NOTE — ED Notes (Signed)
Discharged to lobby. Pt taking bus. Given all of belongings prior to discharge. Able to eat, drink and ambulate appropriately.

## 2020-12-16 ENCOUNTER — Emergency Department: Payer: Medicaid Other

## 2020-12-16 ENCOUNTER — Other Ambulatory Visit: Payer: Self-pay

## 2020-12-16 ENCOUNTER — Emergency Department
Admission: EM | Admit: 2020-12-16 | Discharge: 2020-12-16 | Disposition: A | Discharge status: Home / Self Care | Payer: Medicaid Other | Attending: Emergency Medicine | Admitting: Emergency Medicine

## 2020-12-16 DIAGNOSIS — M545 Low back pain, unspecified: Secondary | ICD-10-CM | POA: Diagnosis not present

## 2020-12-16 DIAGNOSIS — G8929 Other chronic pain: Secondary | ICD-10-CM | POA: Diagnosis not present

## 2020-12-16 DIAGNOSIS — W19XXXA Unspecified fall, initial encounter: Secondary | ICD-10-CM | POA: Diagnosis not present

## 2020-12-16 DIAGNOSIS — I10 Essential (primary) hypertension: Secondary | ICD-10-CM | POA: Diagnosis not present

## 2020-12-16 DIAGNOSIS — F17210 Nicotine dependence, cigarettes, uncomplicated: Secondary | ICD-10-CM | POA: Insufficient documentation

## 2020-12-16 DIAGNOSIS — Y900 Blood alcohol level of less than 20 mg/100 ml: Secondary | ICD-10-CM | POA: Insufficient documentation

## 2020-12-16 DIAGNOSIS — M25572 Pain in left ankle and joints of left foot: Secondary | ICD-10-CM | POA: Diagnosis not present

## 2020-12-16 DIAGNOSIS — F1094 Alcohol use, unspecified with alcohol-induced mood disorder: Secondary | ICD-10-CM | POA: Diagnosis not present

## 2020-12-16 LAB — COMPREHENSIVE METABOLIC PANEL
ALT: 26 U/L (ref 0–44)
AST: 39 U/L (ref 15–41)
Albumin: 4.3 g/dL (ref 3.5–5.0)
Alkaline Phosphatase: 82 U/L (ref 38–126)
Anion gap: 10 (ref 5–15)
BUN: 7 mg/dL (ref 6–20)
CO2: 28 mmol/L (ref 22–32)
Calcium: 8.9 mg/dL (ref 8.9–10.3)
Chloride: 103 mmol/L (ref 98–111)
Creatinine, Ser: 0.87 mg/dL (ref 0.61–1.24)
GFR, Estimated: >60 mL/min (ref >60)
Glucose, Bld: 97 mg/dL (ref 70–99)
Potassium: 3.7 mmol/L (ref 3.5–5.1)
Sodium: 141 mmol/L (ref 135–145)
Total Bilirubin: 1 mg/dL (ref 0.3–1.2)
Total Protein: 8.4 g/dL — ABNORMAL HIGH (ref 6.5–8.1)

## 2020-12-16 LAB — CBC
HCT: 50.6 % (ref 39.0–52.0)
Hemoglobin: 17.7 g/dL — ABNORMAL HIGH (ref 13.0–17.0)
MCH: 32.7 pg (ref 26.0–34.0)
MCHC: 35 g/dL (ref 30.0–36.0)
MCV: 93.4 fL (ref 80.0–100.0)
Platelets: 263 10*3/uL (ref 150–400)
RBC: 5.42 MIL/uL (ref 4.22–5.81)
RDW: 13.3 % (ref 11.5–15.5)
WBC: 8.3 10*3/uL (ref 4.0–10.5)
nRBC: 0 % (ref 0.0–0.2)

## 2020-12-16 LAB — ACETAMINOPHEN LEVEL: Acetaminophen (Tylenol), Serum: <10 ug/mL — ABNORMAL LOW (ref 10–30)

## 2020-12-16 LAB — SALICYLATE LEVEL: Salicylate Lvl: <7 mg/dL — ABNORMAL LOW (ref 7.0–30.0)

## 2020-12-16 LAB — ETHANOL: Alcohol, Ethyl (B): <10 mg/dL (ref <10)

## 2020-12-16 MED ORDER — IBUPROFEN 800 MG PO TABS
800.0000 mg | ORAL_TABLET | Freq: Once | ORAL | Status: AC
  Administered 2020-12-16: 800 mg via ORAL
  Filled 2020-12-16: qty 1

## 2020-12-16 NOTE — ED Notes (Signed)
Social work at bedside.  

## 2020-12-16 NOTE — ED Notes (Signed)
This RN attempted to D/C and obtain vitals but pt refuses vitals stating "I am just fine" and then also states he was told "you need to get me back to [place] for court tomorrow" Social work consulted

## 2020-12-16 NOTE — ED Notes (Signed)
Pt dressed out, the following items placed in two labeled bags: black backpack, black sneakers, black and yellow jacket, grey t shirt, brown wallet, jeans, socks, underwear.

## 2020-12-16 NOTE — ED Triage Notes (Signed)
Pt states slipped in grass tonight injuring left ankle and lower back. Pt without obvious deformity noted. Pt denies loc or head injury

## 2020-12-16 NOTE — ED Notes (Signed)
Pt states he is waiting for SW to come back. Pt still refusing vitals.
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
## 2020-12-16 NOTE — ED Triage Notes (Signed)
Pt arrived via [facility], picked up from the street-pt homeless, pt c/o L ankle pain and low back pain that is chronic, here before for the same pain.  Pt ambulatory without difficulty.   149/100 p 96, 97%RA

## 2020-12-16 NOTE — ED Notes (Signed)
D/C and reasons to return to ED discussed with pt, pt verbalized understanding. NAD noted. Pt ambulatory in room with steady gait.

## 2020-12-16 NOTE — ED Notes (Signed)
All belongings given back to pt. Personal belongings bag given back as well as 1 black pack given back to pt at D/C.

## 2020-12-16 NOTE — ED Notes (Signed)
Pt was given bus pass from Child psychotherapist. Pt is a difficulty d/c stating "I need to stay another night", Pt educated he was up for D/C and if he refuses to leave he would need to be escorted off the property.   Pt to be D/C'ed with a box lunch and drink.

## 2020-12-16 NOTE — ED Notes (Signed)
Pt states he is also having suicidal thoughts and would like to see the psychiatrist.

## 2020-12-16 NOTE — ED Notes (Signed)
Pt ambulatory in lobby without difficulty, has been to bathroom, advised we need urine sample, but did not provide, waiting for room to open in the behavioarl allrea.

## 2020-12-16 NOTE — Consult Note (Signed)
[facility] ED Discharge  12/16/2020 10:45 AM John [name]  MRN:  [id number]  Method of visit?: Face to Face   Principal Problem: Alcohol induced mood disorder Discharge Diagnoses: Active Problems:   Alcohol-induced mood disorder (HCC)   Subjective: Alcohol induced mood disorder.  HPI: Patient calm and cooperative on interview. He states that he had a fight with his girlfriend and cannot go back to the motel where he was staying. He states that he has not used any illicit substances since last week, but says that he had been drinking prior to admission. He also endorses marijuana use recently "I thought it was nicotine but I got high, it was marijuana". Patient states that he went through alcohol detox one year ago. Patient states that he has court at [employer] for a misdemeanor. He says that he has no place to live and requested a list of resources upon discharge (this was provided). Pt denies SI/HI/AVH and wants to leave to attend his court date in the morning.  The social worker provided a bus pass.  Total Time spent with patient: 1 hour  Past Psychiatric History: alcohol use disorder  Past Medical History:  Past Medical History:  Diagnosis Date   ASD (atrial septal defect)    Bacterial infection due to H. pylori    Hypertension    Kidney stones     Past Surgical History:  Procedure Laterality Date   ASD REPAIR     FOOT SURGERY Right    KNEE SURGERY Right    Family History: No family history on file. Family Psychiatric  History: none Social History:  Social History   Substance and Sexual Activity  Alcohol Use Yes   Comment: Last consumed Friday 06/10/19, pt states he is "an alcoholic" and "wants to drink"     Social History   Substance and Sexual Activity  Drug Use Not Currently    Social History   Socioeconomic History   Marital status: Single    Spouse name: Not on file   Number of children: Not on file   Years of education: Not on file   Highest  education level: Not on file  Occupational History   Not on file  Tobacco Use   Smoking status: Every Day    Types: Cigarettes   Smokeless tobacco: Never   Tobacco comments:    down to 2 cigarettes a day  Vaping Use   Vaping Use: Never used  Substance and Sexual Activity   Alcohol use: Yes    Comment: Last consumed Friday 06/10/19, pt states he is "an alcoholic" and "wants to drink"   Drug use: Not Currently   Sexual activity: Yes    Birth control/protection: None  Other Topics Concern   Not on file  Social History Narrative   Not on file   Social Determinants of Health   Financial Resource Strain: Not on file  Food Insecurity: Not on file  Transportation Needs: Not on file  Physical Activity: Not on file  Stress: Not on file  Social Connections: Not on file    Tobacco Cessation:  A prescription for an FDA-approved tobacco cessation medication was offered at discharge and the patient refused  Current Medications: No current facility-administered medications for this encounter.   No current outpatient medications on file.   PTA Medications: (Not in a hospital admission)   Musculoskeletal: Strength & Muscle Tone: within normal limits Gait & Station: normal Patient leans: N/A  Psychiatric Specialty Exam:  Presentation  General Appearance: Appropriate for Environment  Eye Contact:Good  Speech:Clear and Coherent  Speech Volume:Normal  Handedness:Right   Mood and Affect  Mood:  Anxious Affect:Appropriate  Thought Process  Thought Processes:Coherent  Descriptions of Associations:Intact  Orientation:Full (Time, Place and Person)  Thought Content:Logical  History of Schizophrenia/Schizoaffective disorder:No  Duration of Psychotic Symptoms:  None Hallucinations:  None Ideas of Reference:None  Suicidal Thoughts:  None Homicidal Thoughts:  None  Sensorium  Memory:Immediate Good; Recent Good; Remote  Good  Judgment:Fair  Insight:Fair   Executive Functions  Concentration:Good  Attention Span:Good  Recall:Good  Fund of Knowledge:Good  Language:Good   Psychomotor Activity  Psychomotor Activity:  WDL  Assets  Assets:Desire for Improvement; Financial Resources/Insurance; Housing; Physical Health; Social Support  Sleep  Sleep:  Good  Physical Exam: Physical Exam Vitals and nursing note reviewed.  Constitutional:      Appearance: Normal appearance.  HENT:     Head: Normocephalic.     Nose: Nose normal.  Pulmonary:     Effort: Pulmonary effort is normal.  Musculoskeletal:        General: Normal range of motion.     Cervical back: Normal range of motion.  Neurological:     General: No focal deficit present.     Mental Status: He is alert and oriented to person, place, and time.  Psychiatric:        Attention and Perception: Attention and perception normal.        Mood and Affect: Mood is anxious.        Speech: Speech normal.        Behavior: Behavior normal. Behavior is cooperative.        Thought Content: Thought content normal.        Cognition and Memory: Cognition and memory normal.        Judgment: Judgment normal.   Review of Systems  Psychiatric/Behavioral:  Positive for substance abuse. The patient is nervous/anxious.   All other systems reviewed and are negative. Blood pressure 130/80, pulse 85, temperature 97.9 F (36.6 C), temperature source Oral, resp. rate 18, height 5\' 9"  (1.753 m), weight 75.3 kg, SpO2 97 %. Body mass index is 24.51 kg/m.   Demographic Factors:  Male and Caucasian  Loss Factors: NA  Historical Factors: NA  Risk Reduction Factors:   Sense of responsibility to family and Positive social support  Continued Clinical Symptoms:  None  Cognitive Features That Contribute To Risk:  None    Suicide Risk:  Minimal: No identifiable suicidal ideation.  Patients presenting with no risk factors but with morbid ruminations;  may be classified as minimal risk based on the severity of the depressive symptoms   Plan Of Care/Follow-up recommendations:  Alcohol induced mood disorder: -Follow up with [facility] -Refrain from alcohol use Activity:  as tolerated  Diet:  heart healthy diet  Disposition: discharge home , NP 12/16/2020, 10:45 AM

## 2020-12-16 NOTE — ED Notes (Signed)
Pt sitting in lobby near security desk, no distress noted, pt has been ambulatory without difficulty.

## 2020-12-16 NOTE — Discharge Instructions (Addendum)
Please use Tylenol or Motrin for your chronic pain.  You can follow-up with orthopedics, Dr. Martha Clan is on-call.  Or you could try to follow-up with your doctor [facility].  Please return as needed.  Follow-up with [facility] if need be.

## 2020-12-16 NOTE — TOC Initial Note (Addendum)
Transition of Care [facility]) - Initial/Assessment Note    Patient Details  Name: John [name] MRN: [id number] Date of Birth: Dec 28, 1980  [facility] CM/SW Contact:    John [name] [place], [place] Phone Number: 12/16/2020, 12:35 PM  Clinical Narrative:                 TOC consulted to provide substance abuse resources before discharge. Patient confirmed being currently homeless. Patient provided [facility] list to include housing/shelter/food resources as well as substance treatment resources. Patient states that he has a court date tomorrow at the [place] court house. Patient requested taxi voucher, however could not confirm a address for drop off. Bus pass provided for transport to court tomorrow.  John Agena, LCSW Transition of Care [phone]    Expected Discharge Plan: Home/Self Care Barriers to Discharge: No Barriers Identified   Patient Goals and CMS Choice Patient states their goals for this hospitalization and ongoing recovery are:: "I need to get to court in [place] tomorrow"      Expected Discharge Plan and Services Expected Discharge Plan: Home/Self Care In-house Referral: Clinical Social Work     Living arrangements for the past 2 months: No permanent address Expected Discharge Date: 12/16/20                                    Prior Living Arrangements/Services Living arrangements for the past 2 months: No permanent address Lives with:: Self          Need for Family Participation in Patient Care: No (Comment) Care giver support system in place?: No (comment)   Criminal Activity/Legal Involvement Pertinent to Current Situation/Hospitalization: No - Comment as needed  Activities of Daily Living      Permission Sought/Granted                  Emotional Assessment Appearance:: Appears older than stated age Attitude/Demeanor/Rapport: Avoidant Affect (typically observed): Constricted Orientation: : Oriented to  Self, Oriented to Place, Oriented to  Time, Oriented to Situation Alcohol / Substance Use: Alcohol Use Psych Involvement: Yes (comment)  Admission diagnosis:  Lt ankle and back pain Patient Active Problem List   Diagnosis Date Noted   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 Hx urethroplasty for urethral trauma in 2020 04/30/2019   Alcohol abuse with intoxication (HCC) 04/30/2019   Acute Femoral condyle fracture (HCC) 04/30/2019   Alcohol abuse 01/14/2018   Substance induced mood disorder (HCC) 01/14/2018   PCP:  Patient, No Pcp Per (Inactive) Pharmacy:   [facility] [address] [place] [address] Phone: [phone] Fax: [phone]     Social Determinants of Health (SDOH) Interventions    Readmission Risk Interventions No flowsheet data found.

## 2020-12-17 ENCOUNTER — Emergency Department
Admission: EM | Admit: 2020-12-17 | Discharge: 2020-12-18 | Disposition: A | Discharge status: Home / Self Care | Payer: No Typology Code available for payment source | Attending: Emergency Medicine | Admitting: Emergency Medicine

## 2020-12-17 ENCOUNTER — Other Ambulatory Visit: Payer: Self-pay

## 2020-12-17 DIAGNOSIS — F1010 Alcohol abuse, uncomplicated: Secondary | ICD-10-CM | POA: Insufficient documentation

## 2020-12-17 DIAGNOSIS — I10 Essential (primary) hypertension: Secondary | ICD-10-CM | POA: Diagnosis not present

## 2020-12-17 DIAGNOSIS — Z5900 Homelessness unspecified: Secondary | ICD-10-CM | POA: Insufficient documentation

## 2020-12-17 DIAGNOSIS — F17210 Nicotine dependence, cigarettes, uncomplicated: Secondary | ICD-10-CM | POA: Insufficient documentation

## 2020-12-17 NOTE — ED Notes (Signed)
Pt. Got snacks and sandwich tray and a drink. 

## 2020-12-17 NOTE — ED Provider Notes (Signed)
[facility] Emergency Department Provider Note  ____________________________________________  Time seen: Approximately 10:48 PM  I have reviewed the triage vital signs and the nursing notes.   HISTORY  Chief Complaint Alcohol Problem (Detox request)    HPI John [name] is a 40 y.o. male with a history of alcohol abuse, malingering who comes ED complaining of homelessness and requesting social work to help him find detox and housing.  Denies any specific SI HI or hallucinations, no intent to harm himself but does have some passive morbid thoughts.  Last alcohol intake was earlier today.  Denies any acute pain fever vomiting or shortness of breath.  No cough.    Past Medical History:  Diagnosis Date   ASD (atrial septal defect)    Bacterial infection due to H. pylori    Hypertension    Kidney stones      Patient Active Problem List   Diagnosis Date Noted   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 Hx urethroplasty for urethral trauma in 2020 04/30/2019   Alcohol abuse with intoxication (HCC) 04/30/2019   Acute Femoral condyle fracture (HCC) 04/30/2019   Alcohol abuse 01/14/2018   Substance induced mood disorder (HCC) 01/14/2018     Past Surgical History:  Procedure Laterality Date   ASD REPAIR     FOOT SURGERY Right    KNEE SURGERY Right      Prior to Admission medications   Not on File     Allergies Penicillins   No family history on file.  Social History Social History   Tobacco Use   Smoking status: Every Day    Types: Cigarettes   Smokeless tobacco: Never   Tobacco comments:    down to 2 cigarettes a day  Vaping Use   Vaping Use: Never used  Substance Use Topics    Alcohol use: Yes    Comment: Last consumed Friday 06/10/19, pt states he is "an alcoholic" and "wants to drink"   Drug use: Not Currently    Review of Systems  Constitutional:   No fever or chills.  ENT:   No sore throat. No rhinorrhea. Cardiovascular:   No chest pain or syncope. Respiratory:   No dyspnea or cough. Gastrointestinal:   Negative for abdominal pain, vomiting and diarrhea.  Musculoskeletal:   Negative for focal pain or swelling All other systems reviewed and are negative except as documented above in ROS and HPI.  ____________________________________________   PHYSICAL EXAM:  VITAL SIGNS: ED Triage Vitals  Enc Vitals Group     BP 12/17/20 1934 114/80     Pulse Rate 12/17/20 1934 79     Resp 12/17/20 1934 20     Temp 12/17/20 1934 98 F (36.7 C)     Temp Source 12/17/20 1934 Oral     SpO2 12/17/20 1934 97 %     Weight 12/17/20 1935 160 lb (72.6 kg)     Height 12/17/20 1935 6' (1.829 m)     Head Circumference --      Peak Flow --      Pain Score 12/17/20 1935 0     Pain Loc --      Pain Edu? --      Excl. in GC? --  Vital signs reviewed, nursing assessments reviewed.   Constitutional:   Alert and oriented. Non-toxic appearance. Eyes:   Conjunctivae are normal. EOMI. ENT      Head:   Normocephalic and atraumatic.      Mouth/Throat:   MMM      Neck:   No meningismus. Full ROM. Hematological/Lymphatic/Immunilogical:   No cervical lymphadenopathy. Cardiovascular:   RRR. Symmetric bilateral radial and DP pulses.  No murmurs. Cap refill less than 2 seconds. Respiratory:   Normal respiratory effort without tachypnea/retractions. Breath sounds are clear and equal bilaterally. No wheezes/rales/rhonchi. Gastrointestinal:   Soft and nontender. Non distended. There is no CVA tenderness.  No rebound, rigidity, or guarding. Musculoskeletal:   Normal range of motion in all extremities.  No edema. Neurologic:   Normal speech and language. Normal coordination.   Steady gait.  Tolerating p.o. Motor grossly intact. No acute focal neurologic deficits are appreciated.  Skin:    Skin is warm, dry and intact. No rash noted.  No wounds.  ____________________________________________    LABS (pertinent positives/negatives) (all labs ordered are listed, but only abnormal results are displayed) Labs Reviewed - No data to display ____________________________________________   EKG  ____________________________________________    RADIOLOGY  No results found.  ____________________________________________   PROCEDURES Procedures  ____________________________________________  CLINICAL IMPRESSION / ASSESSMENT AND PLAN / ED COURSE  Pertinent labs & imaging results that were available during my care of the patient were reviewed by me and considered in my medical decision making (see chart for details).  John [name] was evaluated in Emergency Department on 12/17/2020 for the symptoms described in the history of present illness. He was evaluated in the context of the global COVID-19 pandemic, which necessitated consideration that the patient might be at risk for infection with the SARS-CoV-2 virus that causes COVID-19. Institutional protocols and algorithms that pertain to the evaluation of patients at risk for COVID-19 are in a state of rapid change based on information released by regulatory bodies including the CDC and federal and state organizations. These policies and algorithms were followed during the patient's care in the ED.   Patient comes ED complaining of homelessness and requesting social work assistance with his various needs.  No acute medical complaints.  No acute psychiatric complaints.  Seen by psychiatry team who advises he has no acute needs and is stable for discharge.      ____________________________________________   FINAL CLINICAL IMPRESSION(S) / ED DIAGNOSES    Final diagnoses:  Homelessness  Alcohol abuse     ED  Discharge Orders     None       Portions of this note were generated with dragon dictation software. Dictation errors may occur despite best attempts at proofreading.   Sharman Cheek, MD 12/17/20 2251

## 2020-12-17 NOTE — ED Notes (Signed)
Pt keeps yelling out at random for more food. Denies any other needs. Otherwise calm and cooperative.

## 2020-12-17 NOTE — ED Notes (Signed)
Pt. Got dressed out by this tech and was provided with hospital attire ( top/bottom scrub tops) pt. Belongings were placed with bag that was provided with hospital and pt. Also had a person bag that is labeled with green sticker and paper sticker.   Belongings are :   Pair of blue socks  Brown belt  [employer] t-shirt Black underwear  Personal [employer]

## 2020-12-17 NOTE — ED Triage Notes (Signed)
Pt came to hospital in request to receive help from social work as he is homeless and to get assistance at a detox facility since has history of ETOH use. Pt reports SI; states plan would be to "jump off a bridge". Pt calm and cooperative currently. Skin dry; resp reg/unlabored.

## 2020-12-18 NOTE — ED Notes (Signed)
Pt left without signing for discharge inst.   Pt escorted out by security.  Pt did not want to leave.

## 2020-12-28 ENCOUNTER — Telehealth: Payer: Self-pay | Admitting: Pharmacy Technician

## 2020-12-28 NOTE — Telephone Encounter (Signed)
Patient has Medicaid with prescription drug coverage.  No longer meets [facility]'s eligibility criteria.  Patient notified.  Kacper Cartlidge J. Layni Kreamer Care Manager [facility] 

## 2021-09-08 ENCOUNTER — Other Ambulatory Visit: Payer: Self-pay

## 2021-09-08 DIAGNOSIS — Z20822 Contact with and (suspected) exposure to covid-19: Secondary | ICD-10-CM | POA: Insufficient documentation

## 2021-09-08 DIAGNOSIS — Z5900 Homelessness unspecified: Secondary | ICD-10-CM | POA: Insufficient documentation

## 2021-09-08 DIAGNOSIS — Y906 Blood alcohol level of 120-199 mg/100 ml: Secondary | ICD-10-CM | POA: Insufficient documentation

## 2021-09-08 DIAGNOSIS — R45851 Suicidal ideations: Secondary | ICD-10-CM | POA: Diagnosis not present

## 2021-09-08 DIAGNOSIS — F1014 Alcohol abuse with alcohol-induced mood disorder: Secondary | ICD-10-CM | POA: Diagnosis present

## 2021-09-08 DIAGNOSIS — I10 Essential (primary) hypertension: Secondary | ICD-10-CM | POA: Insufficient documentation

## 2021-09-08 DIAGNOSIS — D72829 Elevated white blood cell count, unspecified: Secondary | ICD-10-CM | POA: Insufficient documentation

## 2021-09-08 DIAGNOSIS — F17210 Nicotine dependence, cigarettes, uncomplicated: Secondary | ICD-10-CM | POA: Insufficient documentation

## 2021-09-08 DIAGNOSIS — F1010 Alcohol abuse, uncomplicated: Secondary | ICD-10-CM | POA: Diagnosis not present

## 2021-09-08 LAB — COMPREHENSIVE METABOLIC PANEL
ALT: 11 U/L (ref 0–44)
AST: 24 U/L (ref 15–41)
Albumin: 4.8 g/dL (ref 3.5–5.0)
Alkaline Phosphatase: 60 U/L (ref 38–126)
Anion gap: 16 — ABNORMAL HIGH (ref 5–15)
BUN: 16 mg/dL (ref 6–20)
CO2: 19 mmol/L — ABNORMAL LOW (ref 22–32)
Calcium: 9.2 mg/dL (ref 8.9–10.3)
Chloride: 101 mmol/L (ref 98–111)
Creatinine, Ser: 1.25 mg/dL — ABNORMAL HIGH (ref 0.61–1.24)
GFR, Estimated: >60 mL/min (ref >60)
Glucose, Bld: 100 mg/dL — ABNORMAL HIGH (ref 70–99)
Potassium: 3.6 mmol/L (ref 3.5–5.1)
Sodium: 136 mmol/L (ref 135–145)
Total Bilirubin: 1.7 mg/dL — ABNORMAL HIGH (ref 0.3–1.2)
Total Protein: 8.7 g/dL — ABNORMAL HIGH (ref 6.5–8.1)

## 2021-09-08 LAB — ETHANOL: Alcohol, Ethyl (B): 189 mg/dL — ABNORMAL HIGH (ref <10)

## 2021-09-08 LAB — CBC
HCT: 48.7 % (ref 39.0–52.0)
Hemoglobin: 16.2 g/dL (ref 13.0–17.0)
MCH: 29.2 pg (ref 26.0–34.0)
MCHC: 33.3 g/dL (ref 30.0–36.0)
MCV: 87.7 fL (ref 80.0–100.0)
Platelets: 284 10*3/uL (ref 150–400)
RBC: 5.55 MIL/uL (ref 4.22–5.81)
RDW: 12.8 % (ref 11.5–15.5)
WBC: 11.6 10*3/uL — ABNORMAL HIGH (ref 4.0–10.5)
nRBC: 0 % (ref 0.0–0.2)

## 2021-09-08 LAB — SALICYLATE LEVEL: Salicylate Lvl: <7 mg/dL — ABNORMAL LOW (ref 7.0–30.0)

## 2021-09-08 LAB — ACETAMINOPHEN LEVEL: Acetaminophen (Tylenol), Serum: <10 ug/mL — ABNORMAL LOW (ref 10–30)

## 2021-09-08 NOTE — ED Notes (Signed)
Patient to waiting room via EMS.  Per EMS patient with lower back pain since yesterday after mowing yard.  Also reports that patient was reporting si.  EMS interventions -- temp 98.2, p 92, bp 199/73, pulse oxi 94% on room air, cbg 97, saline loc via 20 g angiocath to right forearm.

## 2021-09-08 NOTE — ED Notes (Addendum)
Pt dressed out: mobile phone placed in pt's backpack, back pack labeled, jeans, brown belt, black sneakers, grey t shirt, cigarrettes, candy placed in one labeled belonging bag. Ariel ed tech secured pt's belongings.

## 2021-09-08 NOTE — ED Triage Notes (Signed)
Pt states has consumed etoh, has chest pain and is suicidal. Pt is cooperative in triage.

## 2021-09-09 ENCOUNTER — Emergency Department
Admission: EM | Admit: 2021-09-09 | Discharge: 2021-09-09 | Disposition: A | Discharge status: Home / Self Care | Payer: No Typology Code available for payment source | Attending: Emergency Medicine | Admitting: Emergency Medicine

## 2021-09-09 DIAGNOSIS — F1994 Other psychoactive substance use, unspecified with psychoactive substance-induced mood disorder: Secondary | ICD-10-CM | POA: Diagnosis present

## 2021-09-09 DIAGNOSIS — F1010 Alcohol abuse, uncomplicated: Secondary | ICD-10-CM | POA: Diagnosis present

## 2021-09-09 DIAGNOSIS — F32A Depression, unspecified: Secondary | ICD-10-CM

## 2021-09-09 LAB — RESP PANEL BY RT-PCR (FLU A&B, COVID) ARPGX2
Influenza A by PCR: NEGATIVE
Influenza B by PCR: NEGATIVE
SARS Coronavirus 2 by RT PCR: NEGATIVE

## 2021-09-09 LAB — URINE DRUG SCREEN, QUALITATIVE (ARMC ONLY)
Amphetamines, Ur Screen: NOT DETECTED
Barbiturates, Ur Screen: NOT DETECTED
Benzodiazepine, Ur Scrn: NOT DETECTED
Cannabinoid 50 Ng, Ur ~~LOC~~: POSITIVE — AB
Cocaine Metabolite,Ur ~~LOC~~: NOT DETECTED
MDMA (Ecstasy)Ur Screen: NOT DETECTED
Methadone Scn, Ur: NOT DETECTED
Opiate, Ur Screen: NOT DETECTED
Phencyclidine (PCP) Ur S: NOT DETECTED
Tricyclic, Ur Screen: NOT DETECTED

## 2021-09-09 MED ORDER — ACETAMINOPHEN 500 MG PO TABS
1000.0000 mg | ORAL_TABLET | Freq: Once | ORAL | Status: AC
  Administered 2021-09-09: 1000 mg via ORAL
  Filled 2021-09-09: qty 2

## 2021-09-09 MED ORDER — NAPROXEN 500 MG PO TABS
500.0000 mg | ORAL_TABLET | Freq: Once | ORAL | Status: AC
  Administered 2021-09-09: 500 mg via ORAL
  Filled 2021-09-09: qty 1

## 2021-09-09 MED ORDER — ONDANSETRON HCL 4 MG/2ML IJ SOLN
4.0000 mg | Freq: Once | INTRAMUSCULAR | Status: AC
  Administered 2021-09-09: 4 mg via INTRAVENOUS
  Filled 2021-09-09: qty 2

## 2021-09-09 MED ORDER — KETOROLAC TROMETHAMINE 15 MG/ML IJ SOLN
15.0000 mg | Freq: Once | INTRAMUSCULAR | Status: AC
  Administered 2021-09-09: 15 mg via INTRAVENOUS
  Filled 2021-09-09: qty 1

## 2021-09-09 NOTE — Consult Note (Signed)
[facility] Face-to-Face Psychiatry Consult   Reason for Consult:  depression context of alcohol intoxication and chronic pain Referring Physician:  Darnelle Catalan Patient Identification: John [name] MRN:  [id number] Principal Diagnosis: Alcohol abuse Diagnosis:  Principal Problem:   Alcohol abuse Active Problems:   Substance induced mood disorder (HCC)   Total Time spent with patient: 30 minutes  Subjective:  "My dad kicked me out."  John [name] is a 41 y.o. male patient admitted with alcohol intoxication and pain. Marland Kitchen  HPI:  Patient seen as he is nearly metabolized alcohol. UDS pending.  Patient states that he is in a lot of chronic pain and that gives him thoughts of not wanting to be alive.  Patient describes depression d/t pain and  chronic alcohol use. Has no without any plan or intent of  suicide. Patient has presented to ED many times in the past with similar complaints.   He does state that he is now homeless and his main concern at this time is alcohol use.  He said his dad kicked him out of his house for unknown reasons, although he admits to getting in argument with him 2 days ago.  Patient said his father asked him to leave the house before he used alcohol this last time.  Denies active suicidal thoughts. Denies homicidal ideation, paranoia, auditory or visual hallucinations.  Past Psychiatric History: Substance induced mood disorder  Risk to Self:   Risk to Others:   Prior Inpatient Therapy:   Prior Outpatient Therapy:    Past Medical History:  Past Medical History:  Diagnosis Date   ASD (atrial septal defect)    Bacterial infection due to H. pylori    Hypertension    Kidney stones     Past Surgical History:  Procedure Laterality Date   ASD REPAIR     FOOT SURGERY Right    KNEE SURGERY Right    Family History: No family history on file. Family Psychiatric  History:  Social History:  Social History   Substance and Sexual Activity  Alcohol Use Yes   Comment: Last  consumed Friday 06/10/19, pt states he is "an alcoholic" and "wants to drink"     Social History   Substance and Sexual Activity  Drug Use Not Currently    Social History   Socioeconomic History   Marital status: Single    Spouse name: Not on file   Number of children: Not on file   Years of education: Not on file   Highest education level: Not on file  Occupational History   Not on file  Tobacco Use   Smoking status: Every Day    Types: Cigarettes   Smokeless tobacco: Never   Tobacco comments:    down to 2 cigarettes a day  Vaping Use   Vaping Use: Never used  Substance and Sexual Activity   Alcohol use: Yes    Comment: Last consumed Friday 06/10/19, pt states he is "an alcoholic" and "wants to drink"   Drug use: Not Currently   Sexual activity: Yes    Birth control/protection: None  Other Topics Concern   Not on file  Social History Narrative   Not on file   Social Determinants of Health   Financial Resource Strain: High Risk (08/10/2019)   Overall Financial Resource Strain (CARDIA)    Difficulty of Paying Living Expenses: Very hard  Food Insecurity: No Food Insecurity (08/10/2019)   Hunger Vital Sign    Worried About Programme researcher, broadcasting/film/video in  the Last Year: Never true    Ran Out of Food in the Last Year: Never true  Transportation Needs: Unmet Transportation Needs (08/10/2019)   PRAPARE - Administrator, Civil Service (Medical): Yes    Lack of Transportation (Non-Medical): Yes  Physical Activity: Not on file  Stress: Not on file  Social Connections: Moderately Isolated (08/10/2019)   Social Connection and Isolation Panel [NHANES]    Frequency of Communication with Friends and Family: Never    Frequency of Social Gatherings with Friends and Family: Never    Attends Religious Services: 1 to 4 times per year    Active Member of [employer] or Organizations: Yes    Attends Banker Meetings: 1 to 4 times per year    Marital Status: Never married    Additional Social History:    Allergies:   Allergies  Allergen Reactions   Penicillins     Did it involve swelling of the face/tongue/throat, SOB, or low BP? No Did it involve sudden or severe rash/hives, skin peeling, or any reaction on the inside of your mouth or nose? No Did you need to seek medical attention at a hospital or doctor's office? Unknown When did it last happen?       If all above answers are "NO", may proceed with cephalosporin use.    Labs:  Results for orders placed or performed during the hospital encounter of 09/09/21 (from the past 48 hour(s))  Comprehensive metabolic panel     Status: Abnormal   Collection Time: 09/08/21 10:37 PM  Result Value Ref Range   Sodium 136 135 - 145 mmol/L   Potassium 3.6 3.5 - 5.1 mmol/L    Comment: HEMOLYSIS AT THIS LEVEL MAY AFFECT RESULT   Chloride 101 98 - 111 mmol/L   CO2 19 (L) 22 - 32 mmol/L   Glucose, Bld 100 (H) 70 - 99 mg/dL    Comment: Glucose reference range applies only to samples taken after fasting for at least 8 hours.   BUN 16 6 - 20 mg/dL   Creatinine, Ser 4.97 (H) 0.61 - 1.24 mg/dL   Calcium 9.2 8.9 - 02.6 mg/dL   Total Protein 8.7 (H) 6.5 - 8.1 g/dL   Albumin 4.8 3.5 - 5.0 g/dL   AST 24 15 - 41 U/L    Comment: HEMOLYSIS AT THIS LEVEL MAY AFFECT RESULT   ALT 11 0 - 44 U/L    Comment: HEMOLYSIS AT THIS LEVEL MAY AFFECT RESULT   Alkaline Phosphatase 60 38 - 126 U/L   Total Bilirubin 1.7 (H) 0.3 - 1.2 mg/dL    Comment: HEMOLYSIS AT THIS LEVEL MAY AFFECT RESULT   GFR, Estimated >60 >60 mL/min    Comment: (NOTE) Calculated using the CKD-EPI Creatinine Equation (2021)    Anion gap 16 (H) 5 - 15    Comment: Performed at [facility], [address], [place], [place] [address]  Ethanol     Status: Abnormal   Collection Time: 09/08/21 10:37 PM  Result Value Ref Range   Alcohol, Ethyl (B) 189 (H) <10 mg/dL    Comment: (NOTE) Lowest detectable limit for serum alcohol is 10 mg/dL.  For medical  purposes only. Performed at [facility], [address] [place], [place] [address]   Salicylate level     Status: Abnormal   Collection Time: 09/08/21 10:37 PM  Result Value Ref Range   Salicylate Lvl <7.0 (L) 7.0 - 30.0 mg/dL    Comment:  Performed at [facility], [address], [place], [place] [address]  Acetaminophen level     Status: Abnormal   Collection Time: 09/08/21 10:37 PM  Result Value Ref Range   Acetaminophen (Tylenol), Serum <10 (L) 10 - 30 ug/mL    Comment: (NOTE) Therapeutic concentrations vary significantly. A range of 10-30 ug/mL  may be an effective concentration for many patients. However, some  are best treated at concentrations outside of this range. Acetaminophen concentrations >150 ug/mL at 4 hours after ingestion  and >50 ug/mL at 12 hours after ingestion are often associated with  toxic reactions.  Performed at [facility], [address] [place], [place] [address]   cbc     Status: Abnormal   Collection Time: 09/08/21 10:37 PM  Result Value Ref Range   WBC 11.6 (H) 4.0 - 10.5 K/uL   RBC 5.55 4.22 - 5.81 MIL/uL   Hemoglobin 16.2 13.0 - 17.0 g/dL   HCT 09.8 11.9 - 14.7 %   MCV 87.7 80.0 - 100.0 fL   MCH 29.2 26.0 - 34.0 pg   MCHC 33.3 30.0 - 36.0 g/dL   RDW 82.9 56.2 - 13.0 %   Platelets 284 150 - 400 K/uL   nRBC 0.0 0.0 - 0.2 %    Comment: Performed at [facility], [address] [place], [place] [address]    Current Facility-Administered Medications  Medication Dose Route Frequency Provider Last Rate Last Admin   acetaminophen (TYLENOL) tablet 1,000 mg  1,000 mg Oral Once Delton Prairie, MD       ketorolac (TORADOL) 15 MG/ML injection 15 mg  15 mg Intravenous Once Arnaldo Natal, MD       naproxen (NAPROSYN) tablet 500 mg  500 mg Oral Once Delton Prairie, MD       ondansetron [facility]) injection 4 mg  4 mg Intravenous Once Arnaldo Natal, MD       No current outpatient medications on file.     Musculoskeletal: Strength & Muscle Tone: within normal limits Gait & Station: normal Patient leans: N/A  Psychiatric Specialty Exam:  Presentation  General Appearance: Disheveled  Eye Contact:Good  Speech:Clear and Coherent  Speech Volume:Normal  Handedness:Right   Mood and Affect  Mood:Euthymic  Affect:Congruent   Thought Process  Thought Processes:Coherent  Descriptions of Associations:Intact  Orientation:Full (Time, Place and Person)  Thought Content:Logical  History of Schizophrenia/Schizoaffective disorder:No  Duration of Psychotic Symptoms:No data recorded Hallucinations:Hallucinations: None  Ideas of Reference:None  Suicidal Thoughts:Suicidal Thoughts: No  Homicidal Thoughts:Homicidal Thoughts: No   Sensorium  Memory:Immediate Fair  Judgment:Fair  Insight:Fair   Executive Functions  Concentration:Fair  Attention Span:Fair  Recall:Fair  Fund of Knowledge:Fair  Language:Fair   Psychomotor Activity  Psychomotor Activity:Psychomotor Activity: Normal   Assets  Assets:Physical Health; Resilience; Social Support; Financial Resources/Insurance   Sleep  Sleep:Sleep: Fair   Physical Exam: Physical Exam Vitals reviewed.  HENT:     Head: Normocephalic.  Eyes:     General:        Right eye: No discharge.        Left eye: No discharge.  Cardiovascular:     Rate and Rhythm: Normal rate.  Musculoskeletal:        General: Normal range of motion.  Neurological:     Mental Status: He is alert and oriented to person, place, and time.  Psychiatric:        Attention and Perception: Attention normal.        Mood  and Affect: Mood normal.        Speech: Speech normal.        Behavior: Behavior is cooperative.        Thought Content: Thought content normal. Thought content is not paranoid or delusional. Thought content does not include homicidal or suicidal ideation.        Cognition and Memory: Cognition normal.        Judgment:  Judgment is impulsive (chronic alcohol abuse).    Review of Systems  Constitutional: Negative.   HENT: Negative.    Respiratory: Negative.    Musculoskeletal:  Positive for back pain.  Psychiatric/Behavioral:  Positive for depression (stable) and substance abuse. Negative for hallucinations, memory loss and suicidal ideas. The patient is not nervous/anxious and does not have insomnia.    Blood pressure 104/70, pulse 96, temperature 98.3 F (36.8 C), temperature source Oral, resp. rate 17, height 5\' 9"  (1.753 m), weight 90.7 kg, SpO2 93 %. Body mass index is 29.53 kg/m.  Treatment Plan Summary: Patient does not meet criteria for involuntary commitment or inpatient psychiatric admission. Refer to TTS for alcohol detox. If he is not accepted to detox treatment by this evening he should be re-assessed.     Disposition: No evidence of imminent risk to self or others at present.   Supportive therapy provided about ongoing stressors.  , NP 09/09/2021 12:50 PM

## 2021-09-09 NOTE — Discharge Instructions (Signed)
You have been seen in the emergency department for a  psychiatric concern. You have been evaluated both medically as well as psychiatrically. Please follow-up with your outpatient resources provided. Return to the emergency department for any worsening symptoms, or any thoughts of hurting yourself or anyone else so that we may attempt to help you.  Please call the numbers provided to arrange follow-up with [facility] or [facility] for further evaluation.

## 2021-09-09 NOTE — ED Notes (Addendum)
Dinner tray given
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
## 2021-09-09 NOTE — BH Assessment (Signed)
Detox  Referral information for detox treatment faxed to:   [facility] ([phone] or [phone])   [facility] ([phone])   [facility] ([phone])   [facility] ([phone])  . [facility] ([phone])   [facility] ([phone])  . [facility] ([phone])   

## 2021-09-09 NOTE — ED Provider Notes (Signed)
Patient's more awake now.  He does not seem to be clinically drunk any longer.  He says he is feeling better.  Psych is going to see him.  He is complaining of some nausea give him some Zofran and either Naprosyn or Toradol IV for his low back pain.   Arnaldo Natal, MD 09/09/21 [phone]

## 2021-09-09 NOTE — ED Notes (Signed)
VOL PATIENT D/C 

## 2021-09-09 NOTE — ED Notes (Signed)
VOL SEEN  BY  NP  

## 2021-09-09 NOTE — ED Provider Notes (Signed)
-----------------------------------------   8:20 PM on 09/09/2021 ----------------------------------------- We do not have TTS available tonight for detox placement.  Patient is sober has been here for 22 hours at this point.  We will provide outpatient resources for the patient.  Patient states he drank last night without the first time since November and states he is not going to drink any further.  Patient admits occasional marijuana but nothing significant per patient.  Patient will be discharged.   Minna Antis, MD 09/09/21 2021

## 2021-09-09 NOTE — ED Notes (Signed)
Pt given crackers and gingerale.

## 2022-09-01 ENCOUNTER — Emergency Department (HOSPITAL_COMMUNITY)
Admission: EM | Admit: 2022-09-01 | Discharge: 2022-09-02 | Discharge status: Against Medical Advice | Payer: MEDICAID | Attending: Emergency Medicine | Admitting: Emergency Medicine

## 2022-09-01 ENCOUNTER — Other Ambulatory Visit: Payer: Self-pay

## 2022-09-01 ENCOUNTER — Encounter (HOSPITAL_COMMUNITY): Payer: Self-pay | Admitting: Emergency Medicine

## 2022-09-01 DIAGNOSIS — W57XXXA Bitten or stung by nonvenomous insect and other nonvenomous arthropods, initial encounter: Secondary | ICD-10-CM | POA: Insufficient documentation

## 2022-09-01 DIAGNOSIS — Z5321 Procedure and treatment not carried out due to patient leaving prior to being seen by health care provider: Secondary | ICD-10-CM | POA: Insufficient documentation

## 2022-09-01 DIAGNOSIS — S30860A Insect bite (nonvenomous) of lower back and pelvis, initial encounter: Secondary | ICD-10-CM | POA: Insufficient documentation

## 2022-09-01 DIAGNOSIS — R079 Chest pain, unspecified: Secondary | ICD-10-CM | POA: Insufficient documentation

## 2022-09-01 DIAGNOSIS — F1090 Alcohol use, unspecified, uncomplicated: Secondary | ICD-10-CM | POA: Insufficient documentation

## 2022-09-01 DIAGNOSIS — Y908 Blood alcohol level of 240 mg/100 ml or more: Secondary | ICD-10-CM | POA: Insufficient documentation

## 2022-09-01 NOTE — ED Triage Notes (Signed)
Pt in [place] streets via [facility] with reported chest pain. Pt states pain started this evening. Pt does endorse heavy ETOH use tonight. Also reporting "spider bite" to R lower back  VS en route: 115/76 93NSR 95%RA

## 2022-09-02 LAB — CBC
HCT: 52.2 % — ABNORMAL HIGH (ref 39.0–52.0)
Hemoglobin: 17.2 g/dL — ABNORMAL HIGH (ref 13.0–17.0)
MCH: 30.7 pg (ref 26.0–34.0)
MCHC: 33 g/dL (ref 30.0–36.0)
MCV: 93.2 fL (ref 80.0–100.0)
Platelets: 250 10*3/uL (ref 150–400)
RBC: 5.6 MIL/uL (ref 4.22–5.81)
RDW: 14.4 % (ref 11.5–15.5)
WBC: 6.2 10*3/uL (ref 4.0–10.5)
nRBC: 0 % (ref 0.0–0.2)

## 2022-09-02 LAB — BASIC METABOLIC PANEL
Anion gap: 10 (ref 5–15)
BUN: 6 mg/dL (ref 6–20)
CO2: 25 mmol/L (ref 22–32)
Calcium: 9 mg/dL (ref 8.9–10.3)
Chloride: 105 mmol/L (ref 98–111)
Creatinine, Ser: 0.75 mg/dL (ref 0.61–1.24)
GFR, Estimated: >60 mL/min (ref >60)
Glucose, Bld: 89 mg/dL (ref 70–99)
Potassium: 3.9 mmol/L (ref 3.5–5.1)
Sodium: 140 mmol/L (ref 135–145)

## 2022-09-02 LAB — ETHANOL: Alcohol, Ethyl (B): 250 mg/dL — ABNORMAL HIGH (ref <10)

## 2022-09-02 LAB — TROPONIN I (HIGH SENSITIVITY): Troponin I (High Sensitivity): <2 ng/L (ref <18)

## 2022-10-13 ENCOUNTER — Emergency Department: Payer: MEDICAID

## 2022-10-13 ENCOUNTER — Emergency Department
Admission: EM | Admit: 2022-10-13 | Discharge: 2022-10-14 | Disposition: A | Discharge status: Home / Self Care | Payer: MEDICAID | Attending: Emergency Medicine | Admitting: Emergency Medicine

## 2022-10-13 ENCOUNTER — Other Ambulatory Visit: Payer: Self-pay

## 2022-10-13 DIAGNOSIS — W19XXXA Unspecified fall, initial encounter: Secondary | ICD-10-CM | POA: Insufficient documentation

## 2022-10-13 DIAGNOSIS — Y907 Blood alcohol level of 200-239 mg/100 ml: Secondary | ICD-10-CM | POA: Insufficient documentation

## 2022-10-13 DIAGNOSIS — F10129 Alcohol abuse with intoxication, unspecified: Secondary | ICD-10-CM | POA: Insufficient documentation

## 2022-10-13 DIAGNOSIS — R45851 Suicidal ideations: Secondary | ICD-10-CM | POA: Diagnosis not present

## 2022-10-13 DIAGNOSIS — I10 Essential (primary) hypertension: Secondary | ICD-10-CM | POA: Diagnosis not present

## 2022-10-13 DIAGNOSIS — M25562 Pain in left knee: Secondary | ICD-10-CM | POA: Diagnosis not present

## 2022-10-13 DIAGNOSIS — F10920 Alcohol use, unspecified with intoxication, uncomplicated: Secondary | ICD-10-CM

## 2022-10-13 LAB — CBC WITH DIFFERENTIAL/PLATELET
Abs Immature Granulocytes: 0.03 10*3/uL (ref 0.00–0.07)
Basophils Absolute: 0.1 10*3/uL (ref 0.0–0.1)
Basophils Relative: 1 %
Eosinophils Absolute: 0.1 10*3/uL (ref 0.0–0.5)
Eosinophils Relative: 1 %
HCT: 49.3 % (ref 39.0–52.0)
Hemoglobin: 15.9 g/dL (ref 13.0–17.0)
Immature Granulocytes: 0 %
Lymphocytes Relative: 36 %
Lymphs Abs: 2.6 10*3/uL (ref 0.7–4.0)
MCH: 30.3 pg (ref 26.0–34.0)
MCHC: 32.3 g/dL (ref 30.0–36.0)
MCV: 94.1 fL (ref 80.0–100.0)
Monocytes Absolute: 0.5 10*3/uL (ref 0.1–1.0)
Monocytes Relative: 6 %
Neutro Abs: 4.1 10*3/uL (ref 1.7–7.7)
Neutrophils Relative %: 56 %
Platelets: 249 10*3/uL (ref 150–400)
RBC: 5.24 MIL/uL (ref 4.22–5.81)
RDW: 13.6 % (ref 11.5–15.5)
WBC: 7.3 10*3/uL (ref 4.0–10.5)
nRBC: 0 % (ref 0.0–0.2)

## 2022-10-13 NOTE — ED Provider Notes (Signed)
[facility] Provider Note    Event Date/Time   First MD Initiated Contact with Patient 10/13/22 2331     (approximate)   History   Shortness of Breath and Leg Pain   HPI  John [name] is a 42 y.o. male with history of hypertension who presents to the emergency department after a fall.  Complaining of left knee pain and hitting his head when he fell while drinking.  He is unable to tell me why he fell.  Also stated that he is suicidal but is not able to tell me if he has a plan.  No HI or hallucinations.  Admits to drinking alcohol tonight.  Belonging smell like marijuana.   History provided by patient.    Past Medical History:  Diagnosis Date   ASD (atrial septal defect)    Bacterial infection due to H. pylori    Hypertension    Kidney stones     Past Surgical History:  Procedure Laterality Date   ASD REPAIR     FOOT SURGERY Right    KNEE SURGERY Right     MEDICATIONS:  Prior to Admission medications   Not on File    Physical Exam   Triage Vital Signs: ED Triage Vitals  Encounter Vitals Group     BP 10/13/22 2117 132/85     Systolic BP Percentile --      Diastolic BP Percentile --      Pulse Rate 10/13/22 2117 87     Resp 10/13/22 2117 18     Temp 10/13/22 2117 98.4 F (36.9 C)     Temp Source 10/13/22 2117 Oral     SpO2 10/13/22 2117 95 %     Weight 10/13/22 2116 180 lb (81.6 kg)     Height 10/13/22 2116 5\' 9"  (1.753 m)     Head Circumference --      Peak Flow --      Pain Score 10/13/22 2116 7     Pain Loc --      Pain Education --      Exclude from Growth Chart --     Most recent vital signs: Vitals:   10/13/22 2117  BP: 132/85  Pulse: 87  Resp: 18  Temp: 98.4 F (36.9 C)  SpO2: 95%     CONSTITUTIONAL: Alert, responds appropriately to questions.  Patient intoxicated HEAD: Normocephalic; atraumatic EYES: Conjunctivae clear, PERRL, EOMI ENT: normal nose; no rhinorrhea; moist mucous membranes; pharynx  without lesions noted; no dental injury; no septal hematoma, no epistaxis; no facial deformity or bony tenderness NECK: Supple, no midline spinal tenderness, step-off or deformity; trachea midline CARD: RRR; S1 and S2 appreciated; no murmurs, no clicks, no rubs, no gallops RESP: Normal chest excursion without splinting or tachypnea; breath sounds clear and equal bilaterally; no wheezes, no rhonchi, no rales; no hypoxia or respiratory distress CHEST:  chest wall stable, no crepitus or ecchymosis or deformity, nontender to palpation; no flail chest ABD/GI: Non-distended; soft, non-tender, no rebound, no guarding; no ecchymosis or other lesions noted PELVIS:  stable, nontender to palpation BACK:  The back appears normal; no midline spinal tenderness, step-off or deformity EXT: Normal ROM in all joints; no edema; normal capillary refill; no cyanosis, no bony tenderness or bony deformity of patient's extremities, no joint effusions, compartments are soft, extremities are warm and well-perfused, no ecchymosis SKIN: Normal color for age and race; warm NEURO: No facial asymmetry, normal speech, moving all extremities equally, ataxic gait  PSYCH: Patient states he is suicidal.  ED Results / Procedures / Treatments   LABS: (all labs ordered are listed, but only abnormal results are displayed) Labs Reviewed  COMPREHENSIVE METABOLIC PANEL - Abnormal; Notable for the following components:      Result Value   Glucose, Bld 119 (*)    Calcium 8.7 (*)    Total Protein 8.2 (*)    All other components within normal limits  ETHANOL - Abnormal; Notable for the following components:   Alcohol, Ethyl (B) 225 (*)    All other components within normal limits  ACETAMINOPHEN LEVEL - Abnormal; Notable for the following components:   Acetaminophen (Tylenol), Serum <10 (*)    All other components within normal limits  SALICYLATE LEVEL - Abnormal; Notable for the following components:   Salicylate Lvl <7.0 (*)     All other components within normal limits  CBC WITH DIFFERENTIAL/PLATELET  URINE DRUG SCREEN, QUALITATIVE ([facility] ONLY)     EKG:  EKG Interpretation Date/Time:  Monday October 13 2022 21:24:14 EDT Ventricular Rate:  85 PR Interval:  140 QRS Duration:  94 QT Interval:  382 QTC Calculation: 454 R Axis:   84  Text Interpretation: Normal sinus rhythm Cannot rule out Anterior infarct , age undetermined Abnormal ECG When compared with ECG of 01-Sep-2022 23:36, No significant change was found Confirmed by Rochele Raring [id number]) on 10/13/2022 11:32:05 PM          RADIOLOGY: My personal review and interpretation of imaging: Chest x-ray, CT head and cervical spine, left knee x-ray showed no traumatic injury.  I have personally reviewed all radiology reports. DG Knee Complete 4 Views Left  Result Date: 10/14/2022 CLINICAL DATA:  Fall.  Left leg pain. EXAM: LEFT KNEE - COMPLETE 4+ VIEW COMPARISON:  Radiograph 09/05/2019 FINDINGS: Intramedullary rod and screw fixation of the distal left femur and proximal tibia. No radiographic evidence of loosening. Healed fractures about the proximal tibia and fibula. No acute fracture or dislocation. No knee joint effusion. Soft tissues are unremarkable. IMPRESSION: Stable posttraumatic and postoperative changes about the left knee. No acute osseous abnormality. Electronically Signed   By: Minerva Fester M.D.   On: 10/14/2022 01:25   CT HEAD WO CONTRAST ( )  Result Date: 10/14/2022 CLINICAL DATA:  Intoxicated, fall, pain EXAM: CT HEAD WITHOUT CONTRAST CT CERVICAL SPINE WITHOUT CONTRAST TECHNIQUE: Multidetector CT imaging of the head and cervical spine was performed following the standard protocol without intravenous contrast. Multiplanar CT image reconstructions of the cervical spine were also generated. RADIATION DOSE REDUCTION: This exam was performed according to the departmental dose-optimization program which includes automated exposure control, adjustment  of the mA and/or kV according to patient size and/or use of iterative reconstruction technique. COMPARISON:  10/19/2020 CT head, 10/09/2020 CT head and cervical spine FINDINGS: CT HEAD FINDINGS Brain: No evidence of acute infarct, hemorrhage, mass, mass effect, or midline shift. No hydrocephalus or extra-axial fluid collection. Vascular: No hyperdense vessel. Skull: Negative for fracture or focal lesion. Sinuses/Orbits: Mucosal thickening in the sphenoid sinus. Otherwise clear paranasal sinuses. No acute finding in the orbits. Other: The mastoid air cells are well aerated. CT CERVICAL SPINE FINDINGS Evaluation is somewhat limited by beam hardening artifact related to the patient's jewelry, which she refused to remove. Alignment: No traumatic listhesis. Skull base and vertebrae: No acute fracture or suspicious osseous lesion. Soft tissues and spinal canal: No prevertebral fluid or swelling. No visible canal hematoma. Disc levels: Degenerative changes in the cervical spine. No significant spinal  canal stenosis. Upper chest: Negative. IMPRESSION: 1. No acute intracranial process. 2. No acute fracture or traumatic listhesis in the cervical spine. Electronically Signed   By: Wiliam Ke M.D.   On: 10/14/2022 01:24   CT Cervical Spine Wo Contrast  Result Date: 10/14/2022 CLINICAL DATA:  Intoxicated, fall, pain EXAM: CT HEAD WITHOUT CONTRAST CT CERVICAL SPINE WITHOUT CONTRAST TECHNIQUE: Multidetector CT imaging of the head and cervical spine was performed following the standard protocol without intravenous contrast. Multiplanar CT image reconstructions of the cervical spine were also generated. RADIATION DOSE REDUCTION: This exam was performed according to the departmental dose-optimization program which includes automated exposure control, adjustment of the mA and/or kV according to patient size and/or use of iterative reconstruction technique. COMPARISON:  10/19/2020 CT head, 10/09/2020 CT head and cervical spine  FINDINGS: CT HEAD FINDINGS Brain: No evidence of acute infarct, hemorrhage, mass, mass effect, or midline shift. No hydrocephalus or extra-axial fluid collection. Vascular: No hyperdense vessel. Skull: Negative for fracture or focal lesion. Sinuses/Orbits: Mucosal thickening in the sphenoid sinus. Otherwise clear paranasal sinuses. No acute finding in the orbits. Other: The mastoid air cells are well aerated. CT CERVICAL SPINE FINDINGS Evaluation is somewhat limited by beam hardening artifact related to the patient's jewelry, which she refused to remove. Alignment: No traumatic listhesis. Skull base and vertebrae: No acute fracture or suspicious osseous lesion. Soft tissues and spinal canal: No prevertebral fluid or swelling. No visible canal hematoma. Disc levels: Degenerative changes in the cervical spine. No significant spinal canal stenosis. Upper chest: Negative. IMPRESSION: 1. No acute intracranial process. 2. No acute fracture or traumatic listhesis in the cervical spine. Electronically Signed   By: Wiliam Ke M.D.   On: 10/14/2022 01:24   DG Chest 2 View  Result Date: 10/13/2022 CLINICAL DATA:  Dyspnea EXAM: CHEST - 2 VIEW COMPARISON:  10/19/2020 FINDINGS: Bibasilar parenchymal scarring. No confluent pulmonary infiltrate. No pneumothorax or pleural effusion. Cardiac size within normal limits. Pulmonary vascularity is normal. No acute bone abnormality. Healed right clavicle fracture. Thoracolumbar fusion hardware partially visualized. IMPRESSION: 1. Bibasilar parenchymal scarring. No acute cardiopulmonary disease. Electronically Signed   By: Helyn Numbers M.D.   On: 10/13/2022 22:41     PROCEDURES:  Critical Care performed: No      Procedures    IMPRESSION / MDM / ASSESSMENT AND PLAN / ED COURSE  I reviewed the triage vital signs and the nursing notes.  Patient here intoxicated with suicidal thoughts and complaints of a fall.     DIFFERENTIAL DIAGNOSIS (includes but not limited  to):   Intoxication, depression, substance abuse, malingering, contusions, intracranial hemorrhage, skull fracture, cervical spine fracture  Patient's presentation is most consistent with acute presentation with potential threat to life or bodily function.  PLAN: Will obtain screening labs, urine, CT head and cervical spine, x-ray of the left knee.  Patient is extremely intoxicated.  He is here voluntary at this time.   MEDICATIONS GIVEN IN ED: Medications - No data to display   ED COURSE: Labs show ethanol level of 225.  Negative Tylenol and salicylate level.  Normal electrolytes.  Left knee x-ray, chest x-ray, CT of the head and cervical spine reviewed and interpreted by myself and the radiologist and show no acute traumatic injury.   CONSULTS: Psychiatry and TTS consulted.  Dispo per psychiatry.   OUTSIDE RECORDS REVIEWED: Reviewed last psychiatric notes in 2022.       FINAL CLINICAL IMPRESSION(S) / ED DIAGNOSES   Final diagnoses:  Alcoholic  intoxication without complication (HCC)  Suicidal ideation  Fall, initial encounter     Rx / DC Orders   ED Discharge Orders     None        Note:  This document was prepared using Dragon voice recognition software and may include unintentional dictation errors.   Janard Culp, Layla Maw, DO 10/14/22 0300

## 2022-10-13 NOTE — ED Notes (Signed)
Unable to locate pt in lobby, bathroom, or outside

## 2022-10-13 NOTE — ED Notes (Signed)
Patient brought to 1hall with black book bag full of beer and a water bottle full of beer. Patient stood up and told this nurse to get him a "fucking" blanket. I denied this request and informed him his language and behavior will not be tolerated. He continued to curse this nurse calling me names and making threats to whoop my ass. Security notified to come to the bedside.

## 2022-10-13 NOTE — ED Notes (Signed)
Pt sitting outside in w/c, sleeping soundly; awakens easily; 40oz empty bottle beer sitting beside pt

## 2022-10-13 NOTE — ED Triage Notes (Signed)
Pt to ED via EMS, pt states he has been having some sob, back pain and left leg pain after a fall yesterday. Pt has been drinking tonight. Hard to get accurate story of what happened to pt. Pt appears in no distress.

## 2022-10-13 NOTE — ED Triage Notes (Signed)
[facility] [place] [facility] [place] c/o left leg pain after fall yesterday am; +ETOH

## 2022-10-14 ENCOUNTER — Emergency Department: Payer: MEDICAID

## 2022-10-14 LAB — COMPREHENSIVE METABOLIC PANEL
ALT: 24 U/L (ref 0–44)
AST: 29 U/L (ref 15–41)
Albumin: 4.2 g/dL (ref 3.5–5.0)
Alkaline Phosphatase: 82 U/L (ref 38–126)
Anion gap: 10 (ref 5–15)
BUN: 6 mg/dL (ref 6–20)
CO2: 29 mmol/L (ref 22–32)
Calcium: 8.7 mg/dL — ABNORMAL LOW (ref 8.9–10.3)
Chloride: 104 mmol/L (ref 98–111)
Creatinine, Ser: 0.76 mg/dL (ref 0.61–1.24)
GFR, Estimated: >60 mL/min (ref >60)
Glucose, Bld: 119 mg/dL — ABNORMAL HIGH (ref 70–99)
Potassium: 3.5 mmol/L (ref 3.5–5.1)
Sodium: 143 mmol/L (ref 135–145)
Total Bilirubin: 0.5 mg/dL (ref 0.3–1.2)
Total Protein: 8.2 g/dL — ABNORMAL HIGH (ref 6.5–8.1)

## 2022-10-14 LAB — ETHANOL: Alcohol, Ethyl (B): 225 mg/dL — ABNORMAL HIGH (ref <10)

## 2022-10-14 LAB — ACETAMINOPHEN LEVEL: Acetaminophen (Tylenol), Serum: <10 ug/mL — ABNORMAL LOW (ref 10–30)

## 2022-10-14 LAB — SALICYLATE LEVEL: Salicylate Lvl: <7 mg/dL — ABNORMAL LOW (ref 7.0–30.0)

## 2022-10-14 NOTE — BH Assessment (Signed)
Patient unable to be assessed at this time.  Psych will continue to follow-up when patient is able to participate.

## 2022-10-14 NOTE — ED Notes (Signed)
Pt resting comfortably. Chest rise and fall noted. Pt is not dressed out but belongings are secured.

## 2022-10-14 NOTE — ED Notes (Signed)
Pt continues to sleep. Respirations unlabored.  

## 2022-10-14 NOTE — ED Notes (Signed)
EDP making AVS, ED tech getting VS. Pt given backpack.

## 2022-10-14 NOTE — ED Provider Notes (Signed)
-----------------------------------------   9:11 AM on 10/14/2022 ----------------------------------------- Patient is awake was able to walk to the restroom.  Is requesting discharge.  Patient's medical workup shows an elevated alcohol level of 225 but no other concerning findings and patient has been in the emergency department greater than 12 hours.  We will discharge from the emergency department.   Minna Antis, MD 10/14/22 [phone]

## 2022-10-14 NOTE — ED Notes (Signed)
Pt woke up, walked to hall bathroom, voided, walked back to stretcher. Pt able to walk with no problem. Still complains of L knee pain. Provided juice with ice. Asking if cigarettes are still in backpack. Informed pt that staff has not removed cigarettes.

## 2022-10-14 NOTE — ED Notes (Signed)
This RN provided patient with a sandwich, graham crackers, grape juice, and water.

## 2022-10-18 ENCOUNTER — Other Ambulatory Visit: Payer: Self-pay

## 2022-10-18 ENCOUNTER — Emergency Department
Admission: EM | Admit: 2022-10-18 | Discharge: 2022-10-18 | Disposition: A | Discharge status: Home / Self Care | Payer: MEDICAID | Attending: Emergency Medicine | Admitting: Emergency Medicine

## 2022-10-18 ENCOUNTER — Emergency Department: Payer: MEDICAID

## 2022-10-18 DIAGNOSIS — W19XXXA Unspecified fall, initial encounter: Secondary | ICD-10-CM | POA: Diagnosis not present

## 2022-10-18 DIAGNOSIS — Z79899 Other long term (current) drug therapy: Secondary | ICD-10-CM | POA: Insufficient documentation

## 2022-10-18 DIAGNOSIS — Y9301 Activity, walking, marching and hiking: Secondary | ICD-10-CM | POA: Diagnosis not present

## 2022-10-18 DIAGNOSIS — F10920 Alcohol use, unspecified with intoxication, uncomplicated: Secondary | ICD-10-CM

## 2022-10-18 DIAGNOSIS — I10 Essential (primary) hypertension: Secondary | ICD-10-CM | POA: Diagnosis not present

## 2022-10-18 DIAGNOSIS — R930 Abnormal findings on diagnostic imaging of skull and head, not elsewhere classified: Secondary | ICD-10-CM | POA: Insufficient documentation

## 2022-10-18 DIAGNOSIS — S8002XA Contusion of left knee, initial encounter: Secondary | ICD-10-CM | POA: Diagnosis not present

## 2022-10-18 DIAGNOSIS — F10129 Alcohol abuse with intoxication, unspecified: Secondary | ICD-10-CM | POA: Insufficient documentation

## 2022-10-18 DIAGNOSIS — Y906 Blood alcohol level of 120-199 mg/100 ml: Secondary | ICD-10-CM | POA: Insufficient documentation

## 2022-10-18 DIAGNOSIS — S8992XA Unspecified injury of left lower leg, initial encounter: Secondary | ICD-10-CM | POA: Diagnosis present

## 2022-10-18 LAB — URINALYSIS, ROUTINE W REFLEX MICROSCOPIC
Bilirubin Urine: NEGATIVE
Glucose, UA: NEGATIVE mg/dL
Hgb urine dipstick: NEGATIVE
Ketones, ur: NEGATIVE mg/dL
Leukocytes,Ua: NEGATIVE
Nitrite: NEGATIVE
Protein, ur: NEGATIVE mg/dL
Specific Gravity, Urine: 1.014 (ref 1.005–1.030)
pH: 6 (ref 5.0–8.0)

## 2022-10-18 LAB — CBC WITH DIFFERENTIAL/PLATELET
Abs Immature Granulocytes: 0.03 10*3/uL (ref 0.00–0.07)
Basophils Absolute: 0.1 10*3/uL (ref 0.0–0.1)
Basophils Relative: 1 %
Eosinophils Absolute: 0.1 10*3/uL (ref 0.0–0.5)
Eosinophils Relative: 1 %
HCT: 45.3 % (ref 39.0–52.0)
Hemoglobin: 15.1 g/dL (ref 13.0–17.0)
Immature Granulocytes: 0 %
Lymphocytes Relative: 17 %
Lymphs Abs: 1.7 10*3/uL (ref 0.7–4.0)
MCH: 30.1 pg (ref 26.0–34.0)
MCHC: 33.3 g/dL (ref 30.0–36.0)
MCV: 90.4 fL (ref 80.0–100.0)
Monocytes Absolute: 0.7 10*3/uL (ref 0.1–1.0)
Monocytes Relative: 7 %
Neutro Abs: 7.4 10*3/uL (ref 1.7–7.7)
Neutrophils Relative %: 74 %
Platelets: 237 10*3/uL (ref 150–400)
RBC: 5.01 MIL/uL (ref 4.22–5.81)
RDW: 14.6 % (ref 11.5–15.5)
WBC: 10 10*3/uL (ref 4.0–10.5)
nRBC: 0 % (ref 0.0–0.2)

## 2022-10-18 LAB — BASIC METABOLIC PANEL
Anion gap: 11 (ref 5–15)
BUN: 9 mg/dL (ref 6–20)
CO2: 26 mmol/L (ref 22–32)
Calcium: 8.4 mg/dL — ABNORMAL LOW (ref 8.9–10.3)
Chloride: 104 mmol/L (ref 98–111)
Creatinine, Ser: 0.61 mg/dL (ref 0.61–1.24)
GFR, Estimated: >60 mL/min (ref >60)
Glucose, Bld: 83 mg/dL (ref 70–99)
Potassium: 3.6 mmol/L (ref 3.5–5.1)
Sodium: 141 mmol/L (ref 135–145)

## 2022-10-18 LAB — URINE DRUG SCREEN, QUALITATIVE (ARMC ONLY)
Amphetamines, Ur Screen: NOT DETECTED
Barbiturates, Ur Screen: NOT DETECTED
Benzodiazepine, Ur Scrn: NOT DETECTED
Cannabinoid 50 Ng, Ur ~~LOC~~: NOT DETECTED
Cocaine Metabolite,Ur ~~LOC~~: NOT DETECTED
MDMA (Ecstasy)Ur Screen: NOT DETECTED
Methadone Scn, Ur: NOT DETECTED
Opiate, Ur Screen: NOT DETECTED
Phencyclidine (PCP) Ur S: NOT DETECTED
Tricyclic, Ur Screen: NOT DETECTED

## 2022-10-18 LAB — ETHANOL: Alcohol, Ethyl (B): 131 mg/dL — ABNORMAL HIGH (ref <10)

## 2022-10-18 MED ORDER — ONDANSETRON 4 MG PO TBDP
4.0000 mg | ORAL_TABLET | Freq: Once | ORAL | Status: AC
  Administered 2022-10-18: 4 mg via ORAL
  Filled 2022-10-18: qty 1

## 2022-10-18 MED ORDER — ACETAMINOPHEN 325 MG PO TABS
650.0000 mg | ORAL_TABLET | Freq: Once | ORAL | Status: AC
  Administered 2022-10-18: 650 mg via ORAL
  Filled 2022-10-18: qty 2

## 2022-10-18 MED ORDER — IBUPROFEN 600 MG PO TABS: 600.0000 mg | ORAL_TABLET | Freq: Three times a day (TID) | ORAL | 0 refills | Status: DC | PRN

## 2022-10-18 NOTE — ED Provider Notes (Signed)
[facility] Provider Note    Event Date/Time   First MD Initiated Contact with Patient 10/18/22 [phone]     (approximate)   History   Fall (Intoxicated patient (patient admits to drinking last night and stopped around 03:00) presents with LEFT knee pain after a fall this morning around 04:00; He denies head innjury/loss of consciousness but cannot tell us exactly what made him fall; No obvious signs of trauma to patient's head, pupils round and reactive)   HPI  John [name] is a 42 y.o. male   is brought to the ED via EMS with complaint of a fall that occurred while walking.  Patient states he has pain to his left knee and later reported low back pain.  He denies any head injury or loss of consciousness.  He does admit to drinking beer at 3 AM and having 1 this morning.  Patient has a history of alcohol abuse, chronic back pain, left knee pain and hypertension.      Physical Exam   Triage Vital Signs: ED Triage Vitals  Encounter Vitals Group     BP 10/18/22 0811 (!) 137/95     Systolic BP Percentile --      Diastolic BP Percentile --      Pulse Rate 10/18/22 0811 87     Resp 10/18/22 0811 20     Temp 10/18/22 0811 97.8 F (36.6 C)     Temp Source 10/18/22 0811 Oral     SpO2 10/18/22 0811 100 %     Weight 10/18/22 0810 185 lb (83.9 kg)     Height 10/18/22 0810 5\' 9"  (1.753 m)     Head Circumference --      Peak Flow --      Pain Score 10/18/22 0810 9     Pain Loc --      Pain Education --      Exclude from Growth Chart --     Most recent vital signs: Vitals:   10/18/22 0811  BP: (!) 137/95  Pulse: 87  Resp: 20  Temp: 97.8 F (36.6 C)  SpO2: 100%     General: Awake, no distress.  Talkative, answers questions appropriately.  Odor EtOH.  Admits that he has been drinking alcohol this morning but not any since 3:30 AM. CV:  Good peripheral perfusion.  Heart regular rate rhythm. Resp:  Normal effort.  Lungs clear bilaterally.  Nontender ribs  to palpation bilaterally. Abd:  No distention.  Soft, nontender, bowel sounds present x 4 quadrants. Other:  Left knee without deformity or discoloration.  Diffuse tenderness on light palpation anteriorly.  No effusion is appreciated.  Range of motion is limited due to patient stating it is painful.  No abrasions or tenderness noted to the scalp and nontender cervical spine.   ED Results / Procedures / Treatments   Labs (all labs ordered are listed, but only abnormal results are displayed) Labs Reviewed  BASIC METABOLIC PANEL - Abnormal; Notable for the following components:      Result Value   Calcium 8.4 (*)    All other components within normal limits  ETHANOL - Abnormal; Notable for the following components:   Alcohol, Ethyl (B) 131 (*)    All other components within normal limits  URINALYSIS, ROUTINE W REFLEX MICROSCOPIC - Abnormal; Notable for the following components:   Color, Urine YELLOW (*)    APPearance CLEAR (*)    All other components within normal limits  CBC  WITH DIFFERENTIAL/PLATELET  URINE DRUG SCREEN, QUALITATIVE ([facility] ONLY)     RADIOLOGY CT head and cervical spine per radiology is negative for acute intracranial injury and cervical spine without fracture or malalignment.  Degenerative changes were noted.  Left knee x-ray images were reviewed by myself independent of the radiologist and was negative for fracture or dislocation.  No effusion present.  Lumbar spine x-ray images were reviewed by myself independent of the radiologist and no fracture or malalignment noted.    PROCEDURES:  Critical Care performed:   Procedures   MEDICATIONS ORDERED IN ED: Medications  ondansetron (ZOFRAN-ODT) disintegrating tablet 4 mg (4 mg Oral Given 10/18/22 1050)  acetaminophen (TYLENOL) tablet 650 mg (650 mg Oral Given 10/18/22 1049)     IMPRESSION / MDM / ASSESSMENT AND PLAN / ED COURSE  I reviewed the triage vital signs and the nursing notes.   Differential  diagnosis includes, but is not limited to, head injury, cervical strain, fracture, alcohol intoxication, left knee pain, lower lumbar pain, strain, contusion.  42 year old male presents to the ED via EMS after a fall that occurred while he was walking.  Patient's admits to EtOH and blood alcohol was 131, UDS was negative, urinalysis negative, CBC and BMP unremarkable and reassuring.  Patient was noted to be walking in the hallway without any difficulty or assistance.  Ace wrap was applied to his knee and patient was made aware that there were no fractures, head injury, cervical injury or new injury to his back.  Patient agrees to eat before taking ibuprofen and a prescription was sent to the pharmacy for him to take 600 mg every 8 hours if needed for muscle or joint pain.  Ice and elevation.  Up with his PCP or urgent care if needed.     Patient's presentation is most consistent with acute presentation with potential threat to life or bodily function.  FINAL CLINICAL IMPRESSION(S) / ED DIAGNOSES   Final diagnoses:  Contusion of left knee, initial encounter  Alcoholic intoxication without complication (HCC)  Fall, initial encounter     Rx / DC Orders   ED Discharge Orders          Ordered    ibuprofen (ADVIL) 600 MG tablet  Every 8 hours PRN        10/18/22 1142             Note:  This document was prepared using Dragon voice recognition software and may include unintentional dictation errors.   Tommi Rumps, PA-C 10/18/22 1433    Jene Every, MD 10/18/22 [phone]

## 2022-10-18 NOTE — ED Triage Notes (Signed)
Intoxicated patient (patient admits to drinking last night and stopped around 03:00) presents with LEFT knee pain after a fall this morning around 04:00; He denies head innjury/loss of consciousness but cannot tell us exactly what made him fall; No obvious signs of trauma to patient's head, pupils round and reactive

## 2022-10-18 NOTE — Discharge Instructions (Addendum)
Follow-up with your primary care provider or urgent care as needed for knee pain.  No fractures were seen on your x-rays.  A prescription for ibuprofen was sent to the pharmacy for you to take as needed for pain.  You will need to eat when taking this medication.  Use ice and elevation to your knee as needed for pain and help with swelling.

## 2022-11-20 IMAGING — CT CT CERVICAL SPINE W/O CM
3 of 4 series · 13 of 33 positions shown, 16 images · non-contrast
Comparison: CT head 11/02/2019, CT angiography chest 04/30/2019

CLINICAL DATA: Duct skater up tended. Status post fall. Laceration
over left eye. Alcohol use.

EXAM:
CT HEAD WITHOUT CONTRAST
CT CERVICAL SPINE WITHOUT CONTRAST
TECHNIQUE: Multidetector CT imaging of the head and cervical spine was
performed following the standard protocol without intravenous
contrast. Multiplanar CT image reconstructions of the cervical spine
were also generated.

[Series 5: sagittal bone · sagittal · 0.29mm/px · 5 of 76 slices shown, 6 images]
[im 26/76  bone]
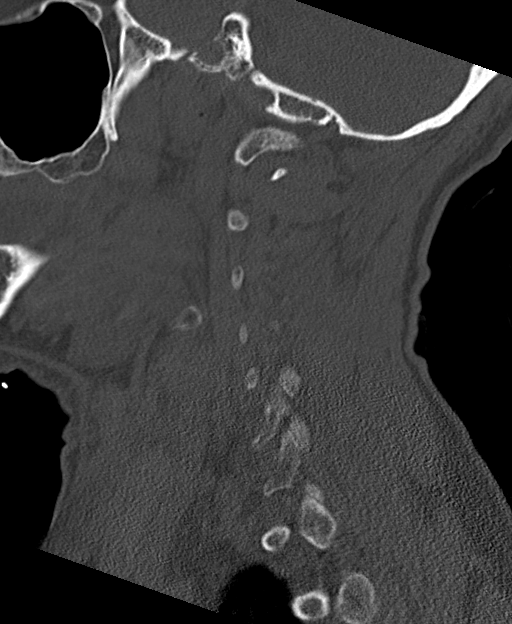
[im 32/76  bone]
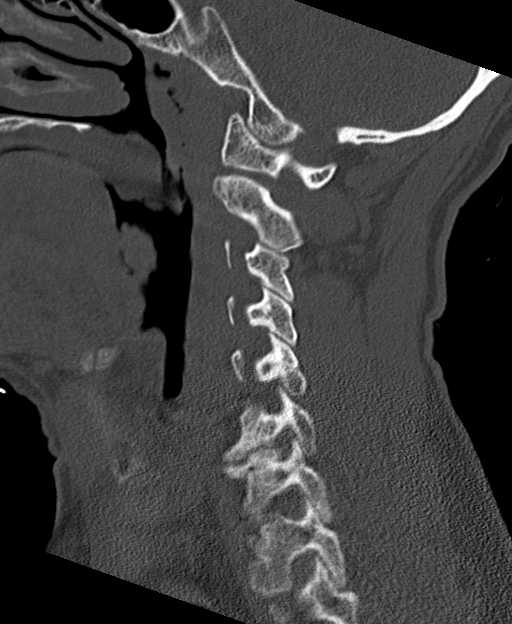
[im 38/76  soft-tissue]
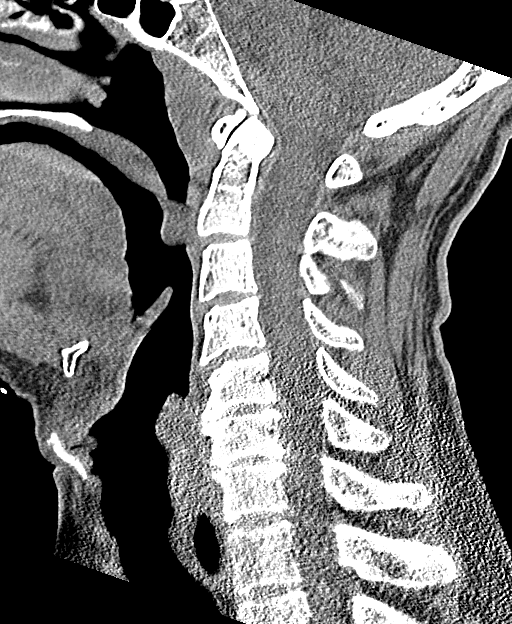
[im 38/76  bone]
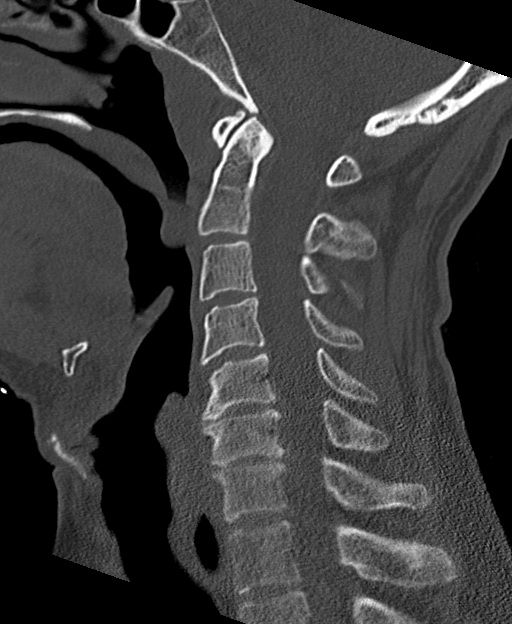
[im 44/76  bone]
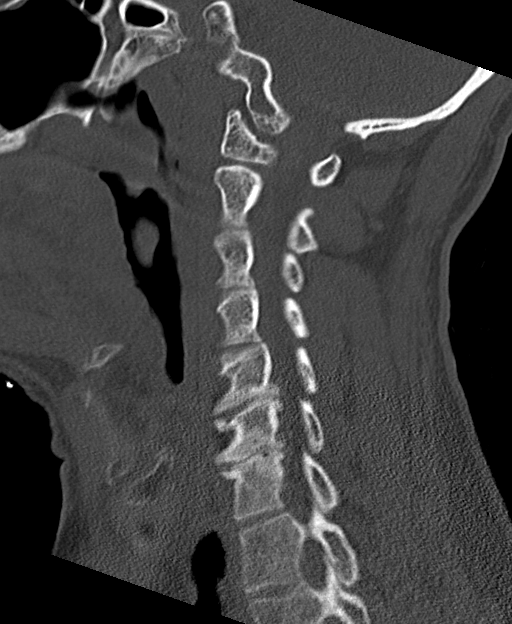
[im 51/76  bone]
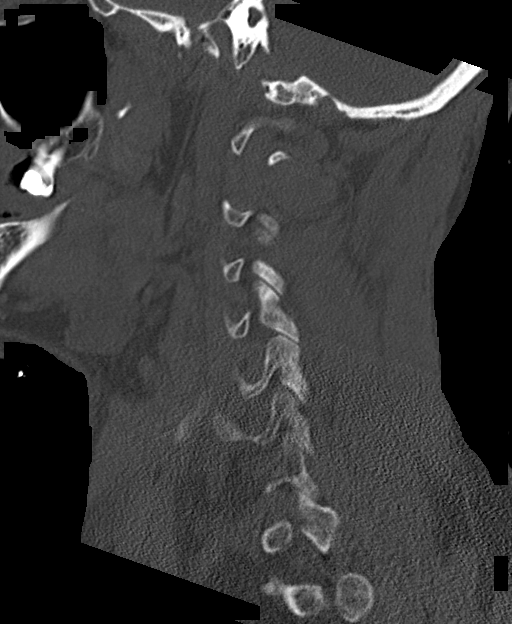

[Series 6: coronal bone · coronal · 0.29mm/px · 3 of 71 slices shown]
[im 16/71  bone]
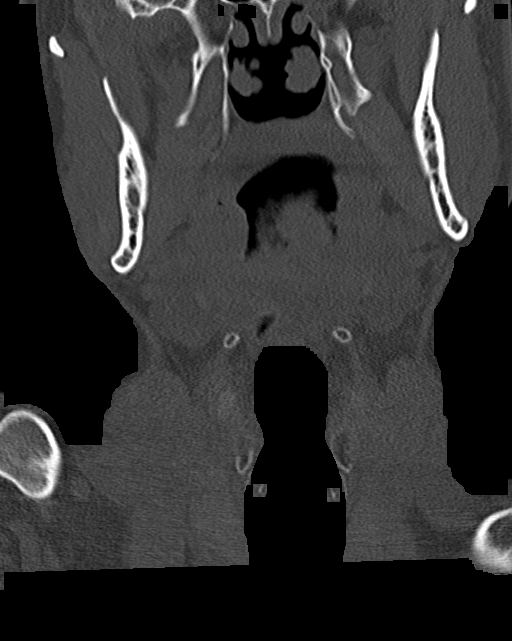
[im 29/71  bone]
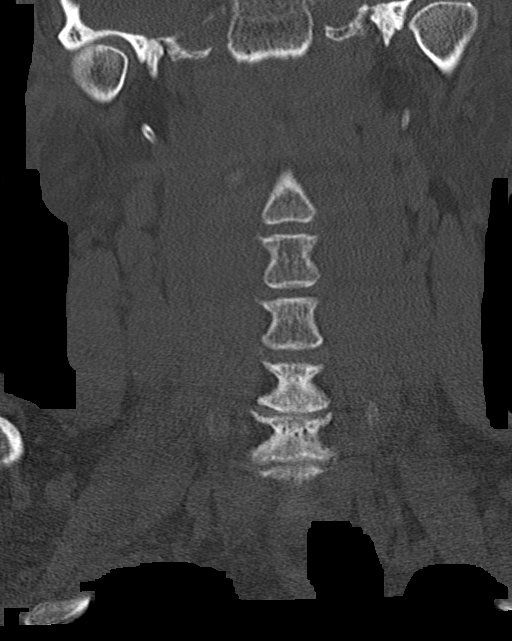
[im 42/71  bone]
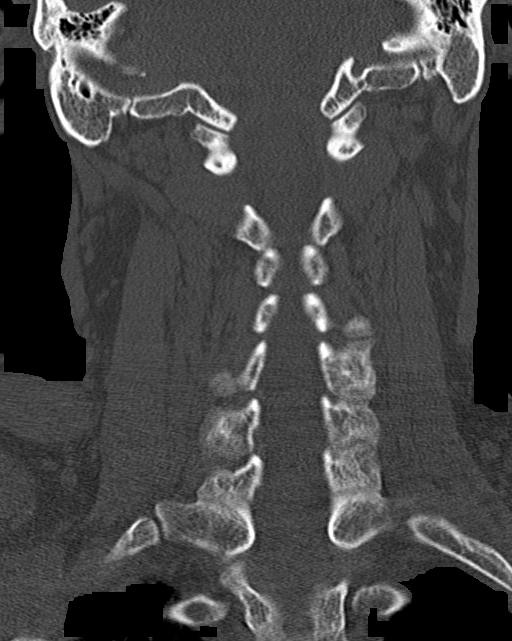

[Series 7: orthogonal bone · axial · 0.28mm/px · z∈[-277,-162]mm · 5 of 93 slices shown, 7 images]
[im 16/93  soft-tissue]
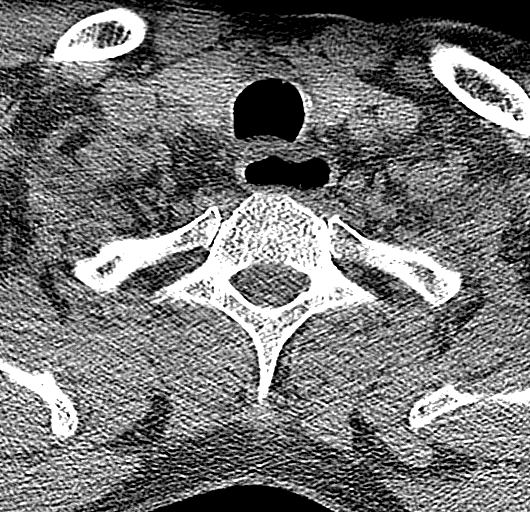
[im 16/93  bone]
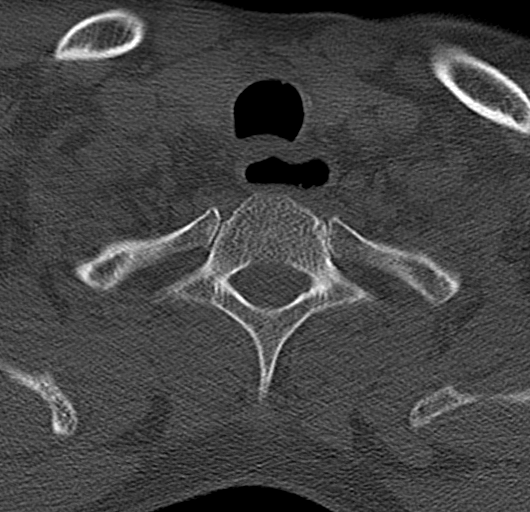
[im 31/93  bone]
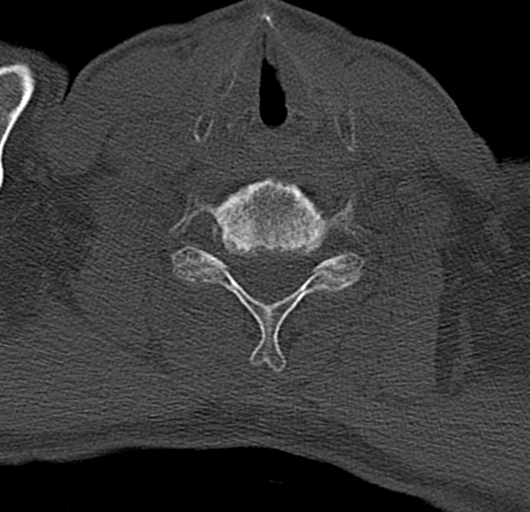
[im 47/93  bone]
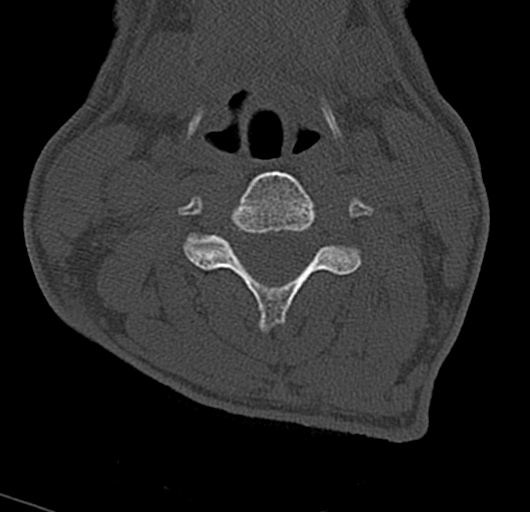
[im 62/93  bone]
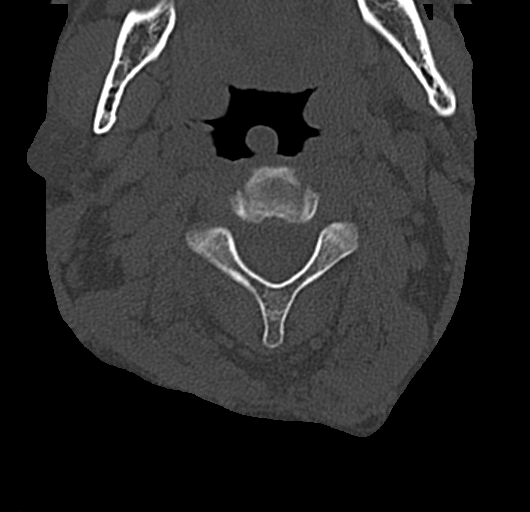
[im 77/93  soft-tissue]
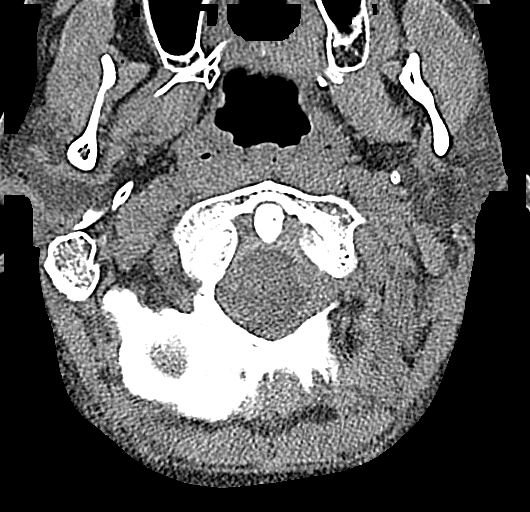
[im 77/93  bone]
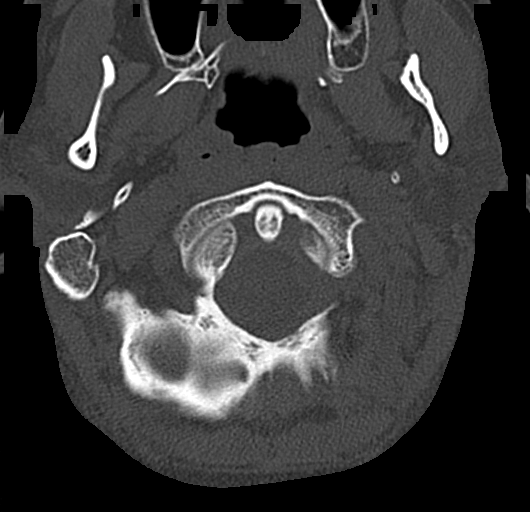

[13 of 33 positions shown; findings below may reference images not displayed]

FINDINGS: CT HEAD FINDINGS

Brain:

No evidence of large-territorial acute infarction. No parenchymal
hemorrhage. No mass lesion. No extra-axial collection.

No mass effect or midline shift. No hydrocephalus. Basilar cisterns
are patent.

Vascular: No hyperdense vessel.

Skull: No acute fracture or focal lesion.

Sinuses/Orbits: Paranasal sinuses and mastoid air cells are clear.
The orbits are unremarkable.

Other: None.

CT CERVICAL SPINE FINDINGS

Alignment: Normal.

Skull base and vertebrae: Mild multilevel degenerative changes of
the spine. No acute fracture. No aggressive appearing focal osseous
lesion or focal pathologic process.

Soft tissues and spinal canal: No prevertebral fluid or swelling. No
visible canal hematoma.

Upper chest: Left apical cystic change similar compared to CT
angiography 04/30/2019. No biapical pneumothoraces.

Other: None.
IMPRESSION: 1. No acute intracranial abnormality.
2. No acute displaced fracture or traumatic listhesis of the
cervical spine.

## 2022-11-20 IMAGING — CT CT HEAD W/O CM
3 series · 15 of 47 positions shown, 18 images · non-contrast
Comparison: CT head 11/02/2019, CT angiography chest 04/30/2019

CLINICAL DATA: Duct skater up tended. Status post fall. Laceration
over left eye. Alcohol use.

EXAM:
CT HEAD WITHOUT CONTRAST
CT CERVICAL SPINE WITHOUT CONTRAST
TECHNIQUE: Multidetector CT imaging of the head and cervical spine was
performed following the standard protocol without intravenous
contrast. Multiplanar CT image reconstructions of the cervical spine
were also generated.

[Series 3: head wo · axial · 0.48mm/px · z∈[-116,+9]mm · 9 of 31 slices shown, 12 images]
[im 3/31  brain]
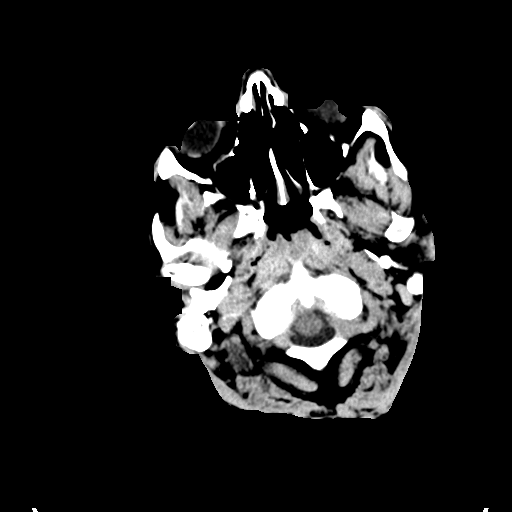
[im 3/31  bone]
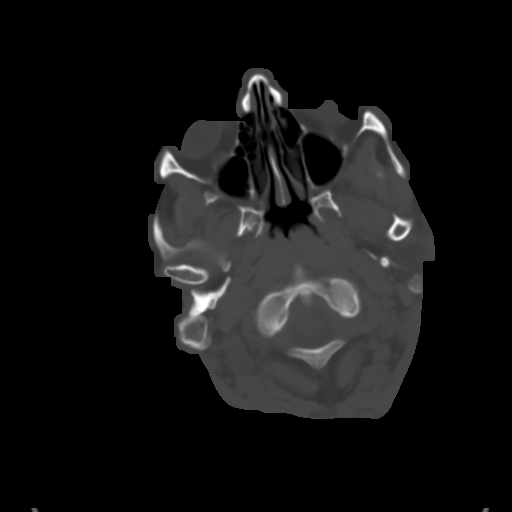
[im 6/31  brain]
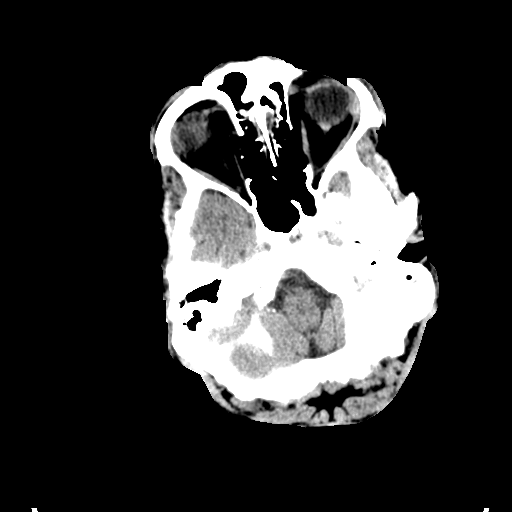
[im 9/31  brain]
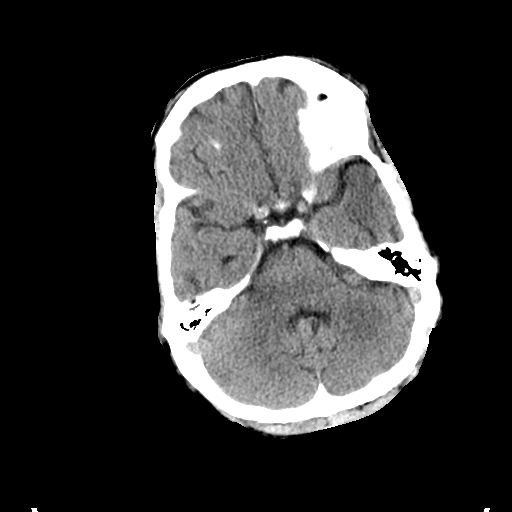
[im 12/31  brain]
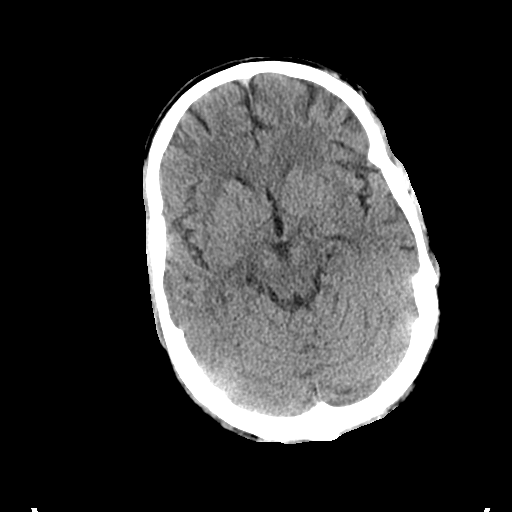
[im 16/31  brain]
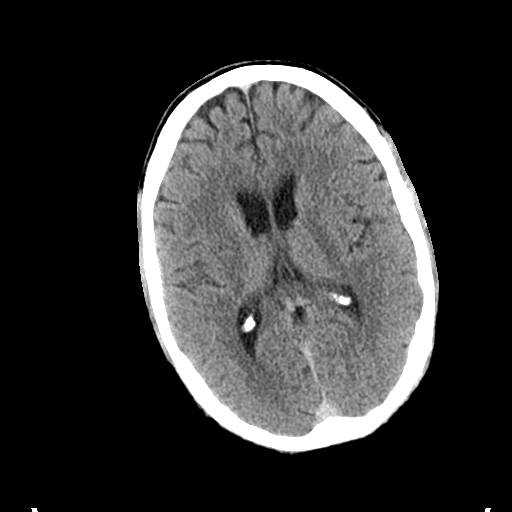
[im 16/31  bone]
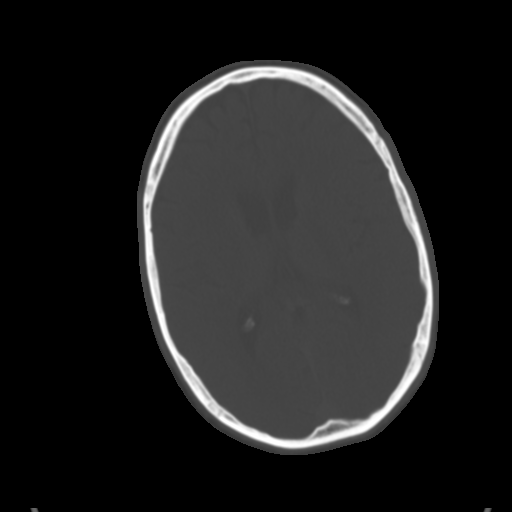
[im 19/31  brain]
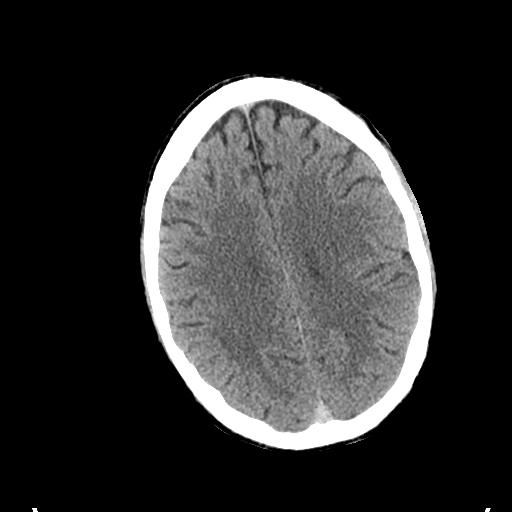
[im 22/31  brain]
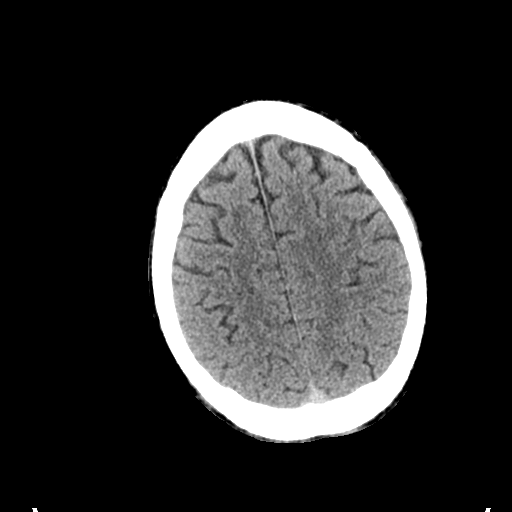
[im 25/31  brain]
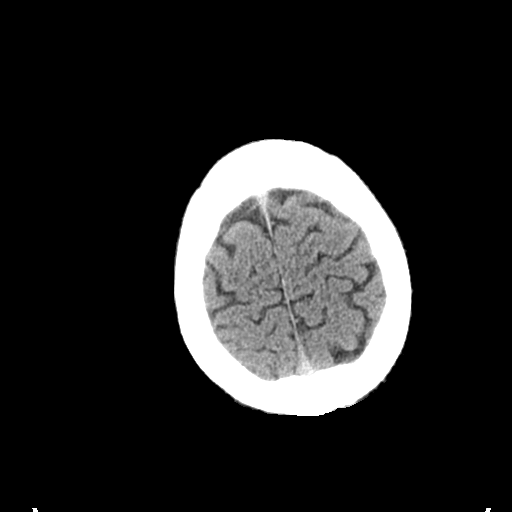
[im 28/31  brain]
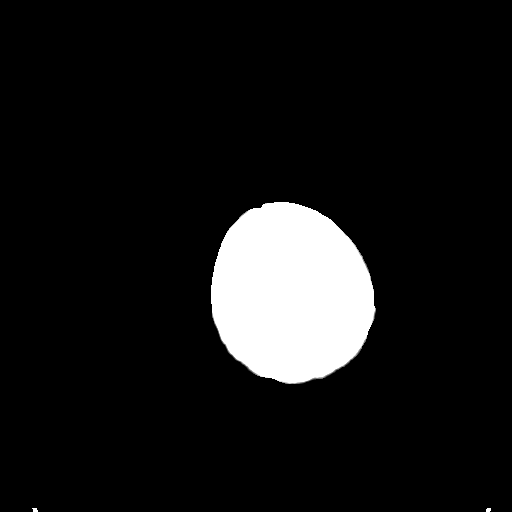
[im 28/31  bone]
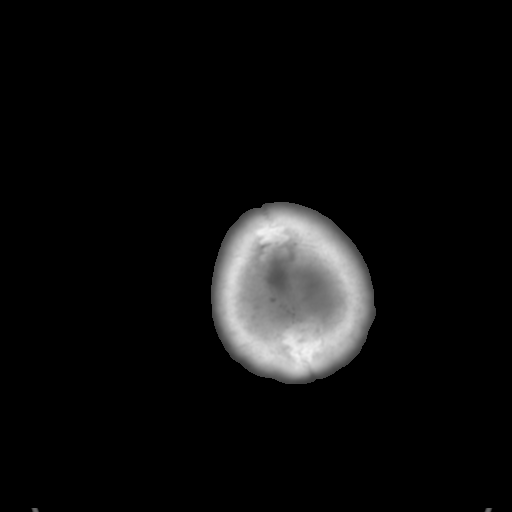

[Series 4: coronal soft tissue · coronal · 0.32mm/px · 3 of 67 slices shown]
[im 23/67  brain]
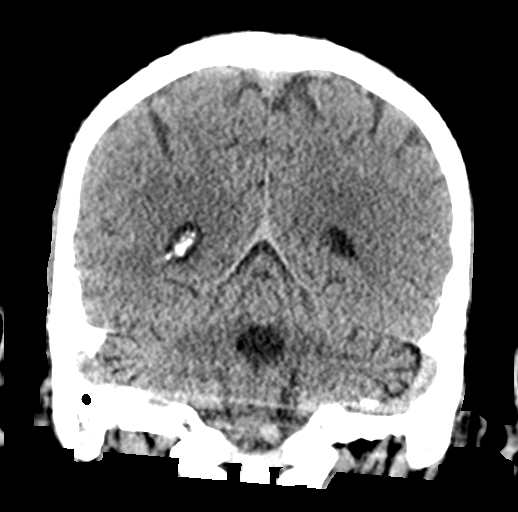
[im 30/67  brain]
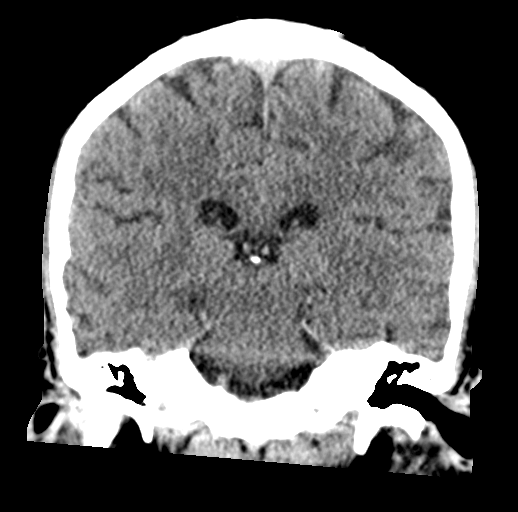
[im 37/67  brain]
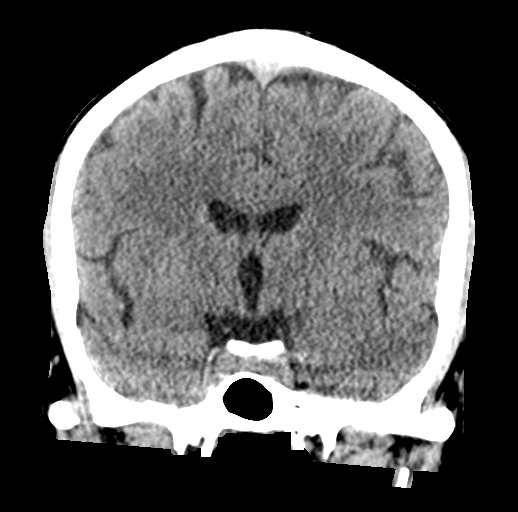

[Series 5: sagittal soft tissue · sagittal · 0.32mm/px · 3 of 54 slices shown]
[im 18/54  brain]
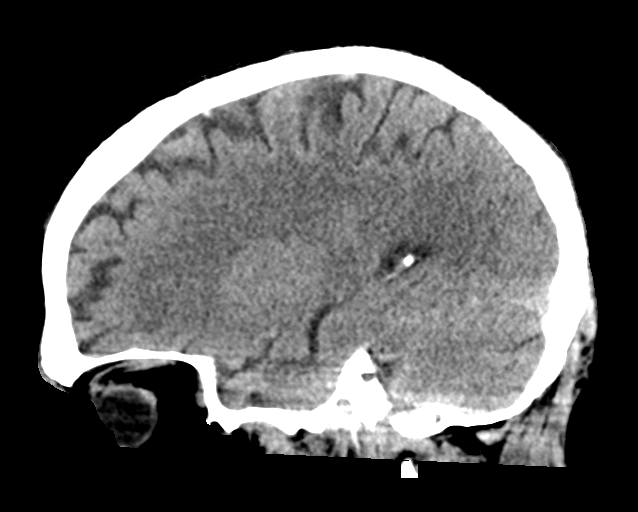
[im 27/54  brain]
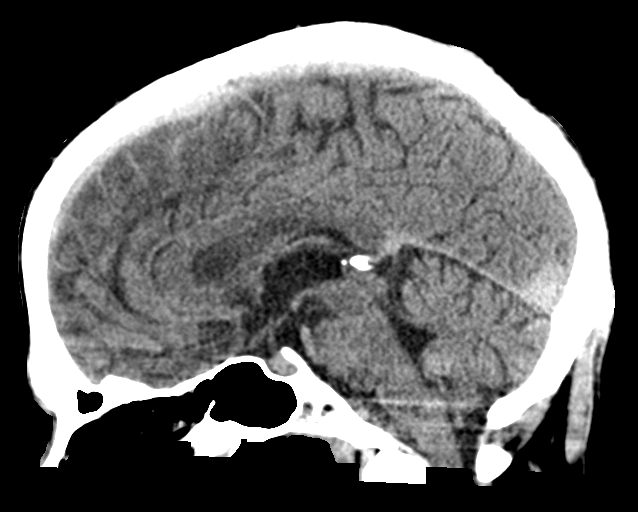
[im 36/54  brain]
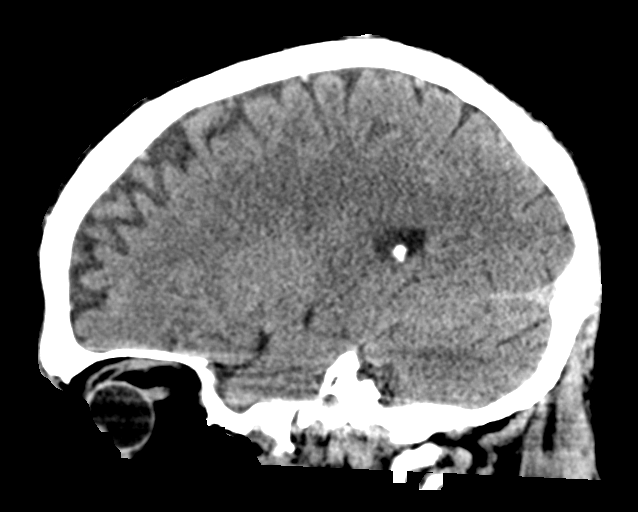

[15 of 47 positions shown; findings below may reference images not displayed]

FINDINGS: CT HEAD FINDINGS

Brain:

No evidence of large-territorial acute infarction. No parenchymal
hemorrhage. No mass lesion. No extra-axial collection.

No mass effect or midline shift. No hydrocephalus. Basilar cisterns
are patent.

Vascular: No hyperdense vessel.

Skull: No acute fracture or focal lesion.

Sinuses/Orbits: Paranasal sinuses and mastoid air cells are clear.
The orbits are unremarkable.

Other: None.

CT CERVICAL SPINE FINDINGS

Alignment: Normal.

Skull base and vertebrae: Mild multilevel degenerative changes of
the spine. No acute fracture. No aggressive appearing focal osseous
lesion or focal pathologic process.

Soft tissues and spinal canal: No prevertebral fluid or swelling. No
visible canal hematoma.

Upper chest: Left apical cystic change similar compared to CT
angiography 04/30/2019. No biapical pneumothoraces.

Other: None.
IMPRESSION: 1. No acute intracranial abnormality.
2. No acute displaced fracture or traumatic listhesis of the
cervical spine.

## 2023-07-03 ENCOUNTER — Emergency Department: Payer: MEDICAID

## 2023-07-03 ENCOUNTER — Other Ambulatory Visit: Payer: Self-pay

## 2023-07-03 ENCOUNTER — Emergency Department
Admission: EM | Admit: 2023-07-03 | Discharge: 2023-07-03 | Payer: MEDICAID | Attending: Emergency Medicine | Admitting: Emergency Medicine

## 2023-07-03 DIAGNOSIS — R55 Syncope and collapse: Secondary | ICD-10-CM | POA: Insufficient documentation

## 2023-07-03 DIAGNOSIS — F10220 Alcohol dependence with intoxication, uncomplicated: Secondary | ICD-10-CM | POA: Insufficient documentation

## 2023-07-03 DIAGNOSIS — F10920 Alcohol use, unspecified with intoxication, uncomplicated: Secondary | ICD-10-CM

## 2023-07-03 NOTE — ED Provider Notes (Signed)
 [facility] Provider Note    Event Date/Time   First MD Initiated Contact with Patient 07/03/23 1510     (approximate)   History   Medical Clearance   HPI  John [name] is a 43 y.o. male  who comes in under alcohol intoxication under police custody.  Patient reportedly was picked up around 130pm intoxicated.  He was taken to jail and there were some question of near syncope in the triage note.  However according to the police staff they stated that he took his shoes off and lay down in the cell and was refusing to get up therefore the jail sent him here for medical clearance.  Patient does report that he has had a fall today unsure if he hit his head.  He does admit to drinking lots of alcohol and states that when he gets drunk he does stumble around and has falls.    Physical Exam   Triage Vital Signs: ED Triage Vitals  Encounter Vitals Group     BP 07/03/23 1459 (!) 127/91     Systolic BP Percentile --      Diastolic BP Percentile --      Pulse Rate 07/03/23 1459 90     Resp 07/03/23 1457 20     Temp 07/03/23 1457 98.5 F (36.9 C)     Temp Source 07/03/23 1457 Oral     SpO2 07/03/23 1459 98 %     Weight --      Height --      Head Circumference --      Peak Flow --      Pain Score 07/03/23 1458 0     Pain Loc --      Pain Education --      Exclude from Growth Chart --     Most recent vital signs: Vitals:   07/03/23 1457 07/03/23 1459  BP:  (!) 127/91  Pulse:  90  Resp: 20   Temp: 98.5 F (36.9 C)   SpO2:  98%     General: Awake, no distress.  CV:  Good peripheral perfusion.  Resp:  Normal effort.  Abd:  No distention.  Other:  No obvious hematoma noted to the head.   ED Results / Procedures / Treatments   Labs (all labs ordered are listed, but only abnormal results are displayed) Labs Reviewed - No data to display   EKG  My interpretation of EKG:  Normal sinus rate of 85 without any ST elevation or T wave inversions,  normal intervals  RADIOLOGY I have reviewed the ct personally and no evidence of ICH    PROCEDURES:  Critical Care performed: No  Procedures   MEDICATIONS ORDERED IN ED: Medications - No data to display   IMPRESSION / MDM / ASSESSMENT AND PLAN / ED COURSE  I reviewed the triage vital signs and the nursing notes.   Patient's presentation is most consistent with acute presentation with potential threat to life or bodily function.   Patient comes in for police clearance.  Patient is obviously intoxicated his vital signs are reassuring however without any evidence of tachycardia or hypotension to suggest electrolyte abnormalities, dehydration.  We got an EKG gets given the question of syncope but it sounds like patient more likely just laid himself down on the ground due to alcohol intoxication.  Given there was a question of a fall we will get CT imaging out of abundance of precaution to ensure no evidence  of intracranial hemorrhage, cervical fracture.  I reviewed patient's blood work from 06/15/2023 where patient had a CBC that was reassuring.  CMP with sodium of 147 but normal creatinine.  UA that was not negative  1. No evidence of an acute intracranial abnormality.  2. 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 This may be post-traumatic in  etiology or may reflect a chronic infarct.  3. Nonspecific 10 mm cystic appearing right parietal scalp lesion.  Correlate with the physical exam findings.    CT cervical spine:    1. No evidence of an acute cervical spine fracture.  2. 2 mm C4-C5 grade 1 anterolisthesis, unchanged from the prior  cervical spine CT of 10/18/2022.  3. Cervical spondylosis as described.   I inspected patient's scalp did not see obvious lesion but he states that he has a known history of this.  He recommended that he follow-up with dermatology he expressed understanding  Patient is ambulatory,  speech is clear.  He on repeat evaluation is no chest wall tenderness no abdominal tenderness moving all extremities.  His vitals are reassuring no signs of severe dehydration.  He is going to be discharged into police custody and is medically cleared  The patient is on the cardiac monitor to evaluate for evidence of arrhythmia and/or significant heart rate changes.      FINAL CLINICAL IMPRESSION(S) / ED DIAGNOSES   Final diagnoses:  Alcoholic intoxication without complication (HCC)     Rx / DC Orders   ED Discharge Orders     None        Note:  This document was prepared using Dragon voice recognition software and may include unintentional dictation errors.   Lubertha Rush, MD 07/03/23 [phone]

## 2023-07-03 NOTE — ED Triage Notes (Addendum)
 Pt to ED via [employer] under arrest in handcuffs. Pt here for medical clearance due to intoxication and near syncope. Pt appears very intoxicated. Pt reports head pain but then states pain 0/10.

## 2023-07-03 NOTE — Discharge Instructions (Addendum)
 return to the ER for worsening symptoms or any other concerns   1. No evidence of an acute intracranial abnormality.  2. 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 This may be post-traumatic in  etiology or may reflect a chronic infarct.  3. Nonspecific 10 mm cystic appearing right parietal scalp lesion.  Correlate with the physical exam findings.    CT cervical spine:    1. No evidence of an acute cervical spine fracture.  2. 2 mm C4-C5 grade 1 anterolisthesis, unchanged from the prior  cervical spine CT of 10/18/2022.  3. Cervical spondylosis as described.

## 2023-07-07 ENCOUNTER — Other Ambulatory Visit: Payer: Self-pay

## 2023-07-07 ENCOUNTER — Emergency Department: Payer: MEDICAID

## 2023-07-07 ENCOUNTER — Emergency Department
Admission: EM | Admit: 2023-07-07 | Discharge: 2023-07-07 | Disposition: A | Discharge status: Home / Self Care | Payer: MEDICAID | Attending: Emergency Medicine | Admitting: Emergency Medicine

## 2023-07-07 DIAGNOSIS — W01198A Fall on same level from slipping, tripping and stumbling with subsequent striking against other object, initial encounter: Secondary | ICD-10-CM | POA: Diagnosis not present

## 2023-07-07 DIAGNOSIS — I10 Essential (primary) hypertension: Secondary | ICD-10-CM | POA: Diagnosis not present

## 2023-07-07 DIAGNOSIS — Y909 Presence of alcohol in blood, level not specified: Secondary | ICD-10-CM | POA: Diagnosis not present

## 2023-07-07 DIAGNOSIS — S0990XA Unspecified injury of head, initial encounter: Secondary | ICD-10-CM | POA: Diagnosis present

## 2023-07-07 DIAGNOSIS — S0003XA Contusion of scalp, initial encounter: Secondary | ICD-10-CM | POA: Insufficient documentation

## 2023-07-07 DIAGNOSIS — F10129 Alcohol abuse with intoxication, unspecified: Secondary | ICD-10-CM | POA: Insufficient documentation

## 2023-07-07 DIAGNOSIS — F10920 Alcohol use, unspecified with intoxication, uncomplicated: Secondary | ICD-10-CM

## 2023-07-07 NOTE — ED Provider Notes (Signed)
 [facility] Provider Note    Event Date/Time   First MD Initiated Contact with Patient 07/07/23 1050     (approximate)   History   Chief Complaint: Medical Clearance   HPI  John [name] is a 43 y.o. male with a history of hypertension and alcohol abuse who is brought to the ED in police custody me for evaluation of head trauma.  Patient fell striking his forehead on the concrete.  No loss of consciousness.  Complains of a headache.  Denies neck pain.  Has been drinking alcohol.  Denies paresthesias or any other complaints        Past Medical History:  Diagnosis Date   ASD (atrial septal defect)    Bacterial infection due to H. pylori    Hypertension    Kidney stones     Current Outpatient Rx   Order #: [id number] Class: Print    Past Surgical History:  Procedure Laterality Date   ASD REPAIR     FOOT SURGERY Right    KNEE SURGERY Right     Physical Exam   Triage Vital Signs: ED Triage Vitals  Encounter Vitals Group     BP 07/07/23 1039 (!) 135/107     Systolic BP Percentile --      Diastolic BP Percentile --      Pulse Rate 07/07/23 1039 83     Resp 07/07/23 1039 16     Temp --      Temp src --      SpO2 --      Weight 07/07/23 1035 170 lb (77.1 kg)     Height 07/07/23 1035 5\' 6"  (1.676 m)     Head Circumference --      Peak Flow --      Pain Score 07/07/23 1035 0     Pain Loc --      Pain Education --      Exclude from Growth Chart --     Most recent vital signs: Vitals:   07/07/23 1039  BP: (!) 135/107  Pulse: 83  Resp: 16    General: Awake, no distress.  CV:  Good peripheral perfusion.  Regular rate and rhythm Resp:  Normal effort.  Unlabored breathing Abd:  No distention.  Soft nontender Other:  Chest wall stable, long bones stable, clavicles and shoulders intact.  There is abrasion over the left forehead, no open laceration.  No midline spinal tenderness.  Slurred speech   ED Results / Procedures / Treatments    Labs (all labs ordered are listed, but only abnormal results are displayed) Labs Reviewed - No data to display   EKG    RADIOLOGY CT head interpreted by me, no intracranial hemorrhage.  Radiology report reviewed CT cervical spine unremarkable   PROCEDURES:  Procedures   MEDICATIONS ORDERED IN ED: Medications - No data to display   IMPRESSION / MDM / ASSESSMENT AND PLAN / ED COURSE  I reviewed the triage vital signs and the nursing notes.  DDx: Intracranial hemorrhage, C-spine fracture, scalp contusion, alcohol intoxication  Patient's presentation is most consistent with acute presentation with potential threat to life or bodily function.  Patient presents with head trauma in the setting of alcohol intoxication.  Unable to clinically clear, will obtain CT head and cervical spine.   ----------------------------------------- 12:16 PM on 07/07/2023 ----------------------------------------- CT imaging reassuring.  No focal deficits, stable for discharge      FINAL CLINICAL IMPRESSION(S) / ED DIAGNOSES  Final diagnoses:  Contusion of scalp, initial encounter  Alcoholic intoxication without complication (HCC)     Rx / DC Orders   ED Discharge Orders     None        Note:  This document was prepared using Dragon voice recognition software and may include unintentional dictation errors.   Jacquie Maudlin, MD 07/07/23 [phone]

## 2023-07-07 NOTE — ED Triage Notes (Signed)
 ARrives in police custody for medical clearance.   Abrasion to left forehead, per officer, patient fell onto concrete hitting head. No LOC.  Patient admits to drinking alcohol today, denies drug use.  AAOx3.  Skin warm and dry. NAD

## 2023-07-07 NOTE — ED Notes (Signed)
 Pt agitated. In police custody. Not safe for staff to get discharge VS at this time.

## 2023-07-07 NOTE — Discharge Instructions (Signed)
 CT scan of the head and neck do not show any injuries.

## 2023-07-23 ENCOUNTER — Emergency Department
Admission: EM | Admit: 2023-07-23 | Discharge: 2023-07-23 | Disposition: A | Discharge status: Home / Self Care | Payer: MEDICAID | Attending: Emergency Medicine | Admitting: Emergency Medicine

## 2023-07-23 DIAGNOSIS — F10129 Alcohol abuse with intoxication, unspecified: Secondary | ICD-10-CM | POA: Diagnosis present

## 2023-07-23 DIAGNOSIS — Y908 Blood alcohol level of 240 mg/100 ml or more: Secondary | ICD-10-CM | POA: Insufficient documentation

## 2023-07-23 LAB — COMPREHENSIVE METABOLIC PANEL WITH GFR
ALT: 37 U/L (ref 0–44)
AST: 45 U/L — ABNORMAL HIGH (ref 15–41)
Albumin: 3.8 g/dL (ref 3.5–5.0)
Alkaline Phosphatase: 77 U/L (ref 38–126)
Anion gap: 7 (ref 5–15)
BUN: <5 mg/dL — ABNORMAL LOW (ref 6–20)
CO2: 30 mmol/L (ref 22–32)
Calcium: 9.2 mg/dL (ref 8.9–10.3)
Chloride: 105 mmol/L (ref 98–111)
Creatinine, Ser: 0.67 mg/dL (ref 0.61–1.24)
GFR, Estimated: >60 mL/min (ref >60)
Glucose, Bld: 107 mg/dL — ABNORMAL HIGH (ref 70–99)
Potassium: 3.6 mmol/L (ref 3.5–5.1)
Sodium: 142 mmol/L (ref 135–145)
Total Bilirubin: 0.8 mg/dL (ref 0.0–1.2)
Total Protein: 7.3 g/dL (ref 6.5–8.1)

## 2023-07-23 LAB — CBC
HCT: 49.4 % (ref 39.0–52.0)
Hemoglobin: 17 g/dL (ref 13.0–17.0)
MCH: 30.9 pg (ref 26.0–34.0)
MCHC: 34.4 g/dL (ref 30.0–36.0)
MCV: 89.7 fL (ref 80.0–100.0)
Platelets: 236 10*3/uL (ref 150–400)
RBC: 5.51 MIL/uL (ref 4.22–5.81)
RDW: 14.7 % (ref 11.5–15.5)
WBC: 5.6 10*3/uL (ref 4.0–10.5)
nRBC: 0 % (ref 0.0–0.2)

## 2023-07-23 LAB — ETHANOL: Alcohol, Ethyl (B): 277 mg/dL — ABNORMAL HIGH (ref <15)

## 2023-07-23 MED ORDER — NICOTINE 21 MG/24HR TD PT24: 21.0000 mg | MEDICATED_PATCH | Freq: Once | TRANSDERMAL | Status: DC

## 2023-07-23 MED ORDER — LORAZEPAM 2 MG/ML IJ SOLN: 1.0000 mg | INTRAMUSCULAR | Status: DC | PRN

## 2023-07-23 MED ORDER — THIAMINE MONONITRATE 100 MG PO TABS
100.0000 mg | ORAL_TABLET | Freq: Every day | ORAL | Status: DC
  Administered 2023-07-23: 100 mg via ORAL
  Filled 2023-07-23: qty 1

## 2023-07-23 MED ORDER — THIAMINE HCL 100 MG/ML IJ SOLN: 100.0000 mg | Freq: Every day | INTRAMUSCULAR | Status: DC

## 2023-07-23 MED ORDER — ADULT MULTIVITAMIN W/MINERALS CH
1.0000 | ORAL_TABLET | Freq: Every day | ORAL | Status: DC
  Administered 2023-07-23: 1 via ORAL
  Filled 2023-07-23: qty 1

## 2023-07-23 MED ORDER — FOLIC ACID 1 MG PO TABS
1.0000 mg | ORAL_TABLET | Freq: Every day | ORAL | Status: DC
  Administered 2023-07-23: 1 mg via ORAL
  Filled 2023-07-23: qty 1

## 2023-07-23 MED ORDER — LORAZEPAM 1 MG PO TABS: 1.0000 mg | ORAL_TABLET | ORAL | Status: DC | PRN

## 2023-07-23 NOTE — ED Provider Notes (Signed)
 [facility] Provider Note   Event Date/Time   First MD Initiated Contact with Patient 07/23/23 1357     (approximate) History  Alcohol Intoxication  HPI John [name] is a 43 y.o. male with past medical history of alcohol abuse who presents complaining of alcohol intoxication and requesting resources for detoxification.  Patient also complains of suicidal ideation without a clear plan.  Patient states that these feelings occur whenever he drinks too much.  Patient states that he has been in inpatient rehab and requests resources for returning ROS: Patient currently denies any vision changes, tinnitus, difficulty speaking, facial droop, sore throat, chest pain, shortness of breath, abdominal pain, nausea/vomiting/diarrhea, dysuria, or weakness/numbness/paresthesias in any extremity   Physical Exam  Triage Vital Signs: ED Triage Vitals  Encounter Vitals Group     BP 07/23/23 1350 (!) 157/100     Systolic BP Percentile --      Diastolic BP Percentile --      Pulse Rate 07/23/23 1347 (!) 114     Resp 07/23/23 1347 20     Temp 07/23/23 1347 98.6 F (37 C)     Temp Source 07/23/23 1347 Oral     SpO2 07/23/23 1347 97 %     Weight --      Height --      Head Circumference --      Peak Flow --      Pain Score 07/23/23 1348 0     Pain Loc --      Pain Education --      Exclude from Growth Chart --    Most recent vital signs: Vitals:   07/23/23 1347 07/23/23 1350  BP:  (!) 157/100  Pulse: (!) 114   Resp: 20   Temp: 98.6 F (37 C)   SpO2: 97%    General: Awake, oriented x4. CV:  Good peripheral perfusion. Resp:  Normal effort. Abd:  No distention. Other:  Middle-aged well-developed, well-nourished Caucasian male resting comfortably in no acute distress ED Results / Procedures / Treatments  Labs (all labs ordered are listed, but only abnormal results are displayed) Labs Reviewed  COMPREHENSIVE METABOLIC PANEL WITH GFR - Abnormal; Notable for the  following components:      Result Value   Glucose, Bld 107 (*)    BUN <5 (*)    AST 45 (*)    All other components within normal limits  CBC  ETHANOL  URINE DRUG SCREEN, QUALITATIVE ([facility] ONLY)   PROCEDURES: Critical Care performed: No Procedures MEDICATIONS ORDERED IN ED: Medications - No data to display IMPRESSION / MDM / ASSESSMENT AND PLAN / ED COURSE  I reviewed the triage vital signs and the nursing notes.                             The patient is on the cardiac monitor to evaluate for evidence of arrhythmia and/or significant heart rate changes. Patient's presentation is most consistent with acute presentation with potential threat to life or bodily function. Presents with altered mental status. +Slurred, sluggish behavior. Stated EtOH intoxication. Airway maintained. Unlikely intracranial bleed, opioid intoxication or coingestion, sepsis, hypothyroidism. Suspect likely transient course of intoxication with expected  improvement of symptoms as patient metabolizes offending agent.  Plan: frequent reassessments  Care of this patient will be signed out to the oncoming physician at the end of my shift.  All pertinent patient information conveyed and all questions  answered.  All further care and disposition decisions will be made by the oncoming physician.   FINAL CLINICAL IMPRESSION(S) / ED DIAGNOSES   Final diagnoses:  None   Rx / DC Orders   ED Discharge Orders     None      Note:  This document was prepared using Dragon voice recognition software and may include unintentional dictation errors.   Dunya Meiners K, MD 07/24/23 [phone]

## 2023-07-23 NOTE — ED Notes (Addendum)
 Tele care coordinators will set up consult at 6:30p CST

## 2023-07-23 NOTE — ED Notes (Signed)
 Pt given dinner tray.

## 2023-07-23 NOTE — BH Assessment (Signed)
 TTS spoke with John [name] for Psych consult.

## 2023-07-23 NOTE — BH Assessment (Signed)
 Comprehensive Clinical Assessment (CCA) Note  07/23/2023 John [name] [id number]  Chief Complaint:  Chief Complaint  Patient presents with   Alcohol Intoxication   Visit Diagnosis: Major Depressive Disorder   John [name] is a 43 year old male who presents to the ER due to having thoughts of ending his life. He states he has suicide attempts in the patient and it resulted in being admitted to a "psych ward, two times." Patient further reports he is having the thought of cutting his wrist. He admits to the use of alcohol and cannabis. Upon arrival to the ER his BAC was 277. During the time of this consult, his UDS hadn't been collected.  During the interview, the patient was calm, cooperative and pleasant. He was able to provide appropriate answers to the questions. He denies HI and AV/H.  CCA Screening, Triage and Referral (STR)  Patient Reported Information How did you hear about us ? Family/Friend  What Is the Reason for Your Visit/Call Today? Patient brought to the ER due to having thoughts of ending his life.  How Long Has This Been Causing You Problems? 1 wk - 1 month  What Do You Feel Would Help You the Most Today? Treatment for Depression or other mood problem; Alcohol or Drug Use Treatment   Have You Recently Had Any Thoughts About Hurting Yourself? Yes  Are You Planning to Commit Suicide/Harm Yourself At This time? No   Flowsheet Row ED from 07/23/2023 in [facility] Emergency Department at [facility] ED from 07/07/2023 in [facility] Emergency Department at [facility] ED from 07/03/2023 in [facility] Emergency Department at [facility]  C-SSRS RISK CATEGORY No Risk No Risk No Risk       Have you Recently Had Thoughts About Hurting Someone Marigene Shoulder? No  Are You Planning to Harm Someone at This Time? No  Explanation: No data recorded  Have You Used Any Alcohol or Drugs in the Past 24 Hours? Yes  How Long Ago Did You Use Drugs or Alcohol? Cannabis  and alcohol  What Did You Use and How Much? No data recorded  Do You Currently Have a Therapist/Psychiatrist? No  Name of Therapist/Psychiatrist:    Have You Been Recently Discharged From Any Office Practice or Programs? No  Explanation of Discharge From Practice/Program: No data recorded    CCA Screening Triage Referral Assessment Type of Contact: Face-to-Face  Telemedicine Service Delivery:   Is this Initial or Reassessment?   Date Telepsych consult ordered in [facility]:    Time Telepsych consult ordered in [facility]:    Location of Assessment: [facility] ED  Provider Location: [facility] ED   Collateral Involvement: No data recorded  Does Patient Have a Court Appointed Legal Guardian? No  Legal Guardian Contact Information: No data recorded Copy of Legal Guardianship Form: No data recorded Legal Guardian Notified of Arrival: No data recorded Legal Guardian Notified of Pending Discharge: No data recorded If Minor and Not Living with Parent(s), Who has Custody? No data recorded Is CPS involved or ever been involved? Never  Is APS involved or ever been involved? Never   Patient Determined To Be At Risk for Harm To Self or Others Based on Review of Patient Reported Information or Presenting Complaint? Yes, for Self-Harm  Method: No data recorded Availability of Means: No data recorded Intent: No data recorded Notification Required: No data recorded Additional Information for Danger to Others Potential: No data recorded Additional Comments for Danger to Others Potential: No data recorded Are There Guns  or Other Weapons in Your Home? No  Types of Guns/Weapons: No data recorded Are These Weapons Safely Secured?                            No  Who Could Verify You Are Able To Have These Secured: No data recorded Do You Have any Outstanding Charges, Pending Court Dates, Parole/Probation? No data recorded Contacted To Inform of Risk of Harm To Self or Others: No data recorded   Does Patient  Present under Involuntary Commitment? No    [place] of Residence: [place]   Patient Currently Receiving the Following Services: Not Receiving Services   Determination of Need: Emergent (2 hours)   Options For Referral: ED Visit   CCA Biopsychosocial Patient Reported Schizophrenia/Schizoaffective Diagnosis in Past: No   Strengths: Some insight, seeking help and polite.   Mental Health Symptoms Depression:  Worthlessness; Difficulty Concentrating; Change in energy/activity   Duration of Depressive symptoms: Duration of Depressive Symptoms: Greater than two weeks   Mania:  None   Anxiety:   N/A   Psychosis:  None   Duration of Psychotic symptoms:    Trauma:  N/A   Obsessions:  N/A   Compulsions:  N/A   Inattention:  N/A   Hyperactivity/Impulsivity:  N/A   Oppositional/Defiant Behaviors:  N/A   Emotional Irregularity:  N/A   Other Mood/Personality Symptoms:  No data recorded   Mental Status Exam Appearance and self-care  Stature:  Average   Weight:  Average weight   Clothing:  Neat/clean; Age-appropriate   Grooming:  Normal   Cosmetic use:  None   Posture/gait:  Normal   Motor activity:  -- (Within normal range)   Sensorium  Attention:  Normal   Concentration:  Normal   Orientation:  X5   Recall/memory:  Normal   Affect and Mood  Affect:  Full Range   Mood:  Depressed; Anxious   Relating  Eye contact:  Normal   Facial expression:  Responsive   Attitude toward examiner:  Cooperative   Thought and Language  Speech flow: Clear and Coherent; Normal   Thought content:  Appropriate to Mood and Circumstances   Preoccupation:  None   Hallucinations:  None   Organization:  Intact; Coherent   [employer] of Knowledge:  Fair   Intelligence:  Average   Abstraction:  Functional; Normal   Judgement:  Fair; Impaired   Reality Testing:  Adequate   Insight:  Poor   Decision Making:  Impulsive   Social  Functioning  Social Maturity:  Self-centered   Social Judgement:  Heedless; "Street Smart"   Stress  Stressors:  Housing; Transitions   Coping Ability:  Overwhelmed; Deficient supports   Skill Deficits:  None   Supports:  Support needed     Religion: Religion/Spirituality Are You A Religious Person?: No  Leisure/Recreation: Leisure / Recreation Do You Have Hobbies?: No  Exercise/Diet: Exercise/Diet Do You Exercise?: No Have You Gained or Lost A Significant Amount of Weight in the Past Six Months?: No Do You Follow a Special Diet?: No Do You Have Any Trouble Sleeping?: No   CCA Employment/Education Employment/Work Situation: Employment / Work Situation Employment Situation: Unemployed Patient's Job has Been Impacted by Current Illness: No  Education: Education Is Patient Currently Attending School?: No Did You Have An Individualized Education Program (IIEP): No Did You Have Any Difficulty At [employer]?: No Patient's Education Has Been Impacted by Current Illness: No  CCA Family/Childhood History Family and Relationship History: Family history Marital status: Single Does patient have children?: No  Childhood History:  Childhood History By whom was/is the patient raised?: Mother Did patient suffer any verbal/emotional/physical/sexual abuse as a child?: No Did patient suffer from severe childhood neglect?: No Has patient ever been sexually abused/assaulted/raped as an adolescent or adult?: No Was the patient ever a victim of a crime or a disaster?: No Witnessed domestic violence?: No Has patient been affected by domestic violence as an adult?: No   CCA Substance Use Alcohol/Drug Use: Alcohol / Drug Use Pain Medications: See MAR Prescriptions: See MAR Over the Counter: See MAR History of alcohol / drug use?: Yes Longest period of sobriety (when/how long): Unable to quantify Substance #1 Name of Substance 1: Alcohol Substance #2 Name of Substance 2:  Cannabis   ASAM's:  Six Dimensions of Multidimensional Assessment  Dimension 1:  Acute Intoxication and/or Withdrawal Potential:      Dimension 2:  Biomedical Conditions and Complications:      Dimension 3:  Emotional, Behavioral, or Cognitive Conditions and Complications:     Dimension 4:  Readiness to Change:     Dimension 5:  Relapse, Continued use, or Continued Problem Potential:     Dimension 6:  Recovery/Living Environment:     ASAM Severity Score:    ASAM Recommended Level of Treatment:     Substance use Disorder (SUD)    Recommendations for Services/Supports/Treatments:    Disposition Recommendation per psychiatric provider: Pending Psych Consult   DSM5 Diagnoses: Patient Active Problem List   Diagnosis Date Noted   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

## 2023-07-23 NOTE — Discharge Instructions (Signed)
 John [name]

## 2023-07-23 NOTE — ED Notes (Signed)
 Pt requested staff to throw away his cigarette and lighter.

## 2023-07-23 NOTE — ED Triage Notes (Signed)
 Pt to ED via [employer] voluntarily for etoh abuse. Pt unsure of how much he drinks daily. Pt appears very intoxicated. Pt reports last etoh use 1hr PTA. Pt not answering this RNs questions stating, "I can't focus with all these beautiful women."

## 2023-07-23 NOTE — ED Notes (Signed)
 Pt left AMA. MD aware and tele consult team made aware

## 2023-08-04 ENCOUNTER — Emergency Department: Admission: EM | Admit: 2023-08-04 | Discharge: 2023-08-04 | Discharge status: Against Medical Advice | Payer: MEDICAID

## 2023-08-04 NOTE — ED Notes (Signed)
 No answer when called several times from lobby

## 2023-08-05 ENCOUNTER — Other Ambulatory Visit: Payer: Self-pay

## 2023-08-05 ENCOUNTER — Emergency Department
Admission: EM | Admit: 2023-08-05 | Discharge: 2023-08-05 | Disposition: A | Discharge status: Other Acute Inpatient Hospital | Payer: MEDICAID | Attending: Emergency Medicine | Admitting: Emergency Medicine

## 2023-08-05 ENCOUNTER — Other Ambulatory Visit (HOSPITAL_COMMUNITY)
Admission: EM | Admit: 2023-08-05 | Discharge: 2023-08-10 | Disposition: A | Discharge status: Home / Self Care | Payer: MEDICAID | Attending: Psychiatry | Admitting: Psychiatry

## 2023-08-05 DIAGNOSIS — F1024 Alcohol dependence with alcohol-induced mood disorder: Secondary | ICD-10-CM | POA: Diagnosis not present

## 2023-08-05 DIAGNOSIS — F10230 Alcohol dependence with withdrawal, uncomplicated: Secondary | ICD-10-CM | POA: Insufficient documentation

## 2023-08-05 DIAGNOSIS — F10229 Alcohol dependence with intoxication, unspecified: Secondary | ICD-10-CM | POA: Diagnosis not present

## 2023-08-05 DIAGNOSIS — F10920 Alcohol use, unspecified with intoxication, uncomplicated: Secondary | ICD-10-CM

## 2023-08-05 DIAGNOSIS — F17200 Nicotine dependence, unspecified, uncomplicated: Secondary | ICD-10-CM | POA: Insufficient documentation

## 2023-08-05 DIAGNOSIS — S61512A Laceration without foreign body of left wrist, initial encounter: Secondary | ICD-10-CM | POA: Diagnosis not present

## 2023-08-05 DIAGNOSIS — F1210 Cannabis abuse, uncomplicated: Secondary | ICD-10-CM | POA: Diagnosis not present

## 2023-08-05 DIAGNOSIS — I10 Essential (primary) hypertension: Secondary | ICD-10-CM | POA: Insufficient documentation

## 2023-08-05 DIAGNOSIS — N400 Enlarged prostate without lower urinary tract symptoms: Secondary | ICD-10-CM | POA: Insufficient documentation

## 2023-08-05 DIAGNOSIS — Z560 Unemployment, unspecified: Secondary | ICD-10-CM | POA: Insufficient documentation

## 2023-08-05 DIAGNOSIS — Y906 Blood alcohol level of 120-199 mg/100 ml: Secondary | ICD-10-CM | POA: Insufficient documentation

## 2023-08-05 DIAGNOSIS — F1290 Cannabis use, unspecified, uncomplicated: Secondary | ICD-10-CM

## 2023-08-05 DIAGNOSIS — F332 Major depressive disorder, recurrent severe without psychotic features: Secondary | ICD-10-CM

## 2023-08-05 DIAGNOSIS — F32A Depression, unspecified: Secondary | ICD-10-CM

## 2023-08-05 DIAGNOSIS — F1994 Other psychoactive substance use, unspecified with psychoactive substance-induced mood disorder: Secondary | ICD-10-CM

## 2023-08-05 DIAGNOSIS — Z5901 Sheltered homelessness: Secondary | ICD-10-CM | POA: Insufficient documentation

## 2023-08-05 DIAGNOSIS — X789XXA Intentional self-harm by unspecified sharp object, initial encounter: Secondary | ICD-10-CM | POA: Diagnosis not present

## 2023-08-05 DIAGNOSIS — Z9151 Personal history of suicidal behavior: Secondary | ICD-10-CM | POA: Insufficient documentation

## 2023-08-05 DIAGNOSIS — F329 Major depressive disorder, single episode, unspecified: Secondary | ICD-10-CM | POA: Diagnosis present

## 2023-08-05 DIAGNOSIS — F1020 Alcohol dependence, uncomplicated: Secondary | ICD-10-CM

## 2023-08-05 DIAGNOSIS — F17210 Nicotine dependence, cigarettes, uncomplicated: Secondary | ICD-10-CM | POA: Insufficient documentation

## 2023-08-05 LAB — CBC
HCT: 47.6 % (ref 39.0–52.0)
Hemoglobin: 15.9 g/dL (ref 13.0–17.0)
MCH: 30.7 pg (ref 26.0–34.0)
MCHC: 33.4 g/dL (ref 30.0–36.0)
MCV: 91.9 fL (ref 80.0–100.0)
Platelets: 278 10*3/uL (ref 150–400)
RBC: 5.18 MIL/uL (ref 4.22–5.81)
RDW: 14.7 % (ref 11.5–15.5)
WBC: 8.8 10*3/uL (ref 4.0–10.5)
nRBC: 0 % (ref 0.0–0.2)

## 2023-08-05 LAB — COMPREHENSIVE METABOLIC PANEL WITH GFR
ALT: 21 U/L (ref 0–44)
AST: 27 U/L (ref 15–41)
Albumin: 4.1 g/dL (ref 3.5–5.0)
Alkaline Phosphatase: 72 U/L (ref 38–126)
Anion gap: 11 (ref 5–15)
BUN: 12 mg/dL (ref 6–20)
CO2: 22 mmol/L (ref 22–32)
Calcium: 9.1 mg/dL (ref 8.9–10.3)
Chloride: 104 mmol/L (ref 98–111)
Creatinine, Ser: 0.79 mg/dL (ref 0.61–1.24)
GFR, Estimated: >60 mL/min (ref >60)
Glucose, Bld: 103 mg/dL — ABNORMAL HIGH (ref 70–99)
Potassium: 3.4 mmol/L — ABNORMAL LOW (ref 3.5–5.1)
Sodium: 137 mmol/L (ref 135–145)
Total Bilirubin: 1 mg/dL (ref 0.0–1.2)
Total Protein: 7.8 g/dL (ref 6.5–8.1)

## 2023-08-05 LAB — ETHANOL: Alcohol, Ethyl (B): 120 mg/dL — ABNORMAL HIGH (ref <15)

## 2023-08-05 MED ORDER — ALUM & MAG HYDROXIDE-SIMETH 200-200-20 MG/5ML PO SUSP: 30.0000 mL | ORAL | Status: DC | PRN

## 2023-08-05 MED ORDER — ACETAMINOPHEN 325 MG PO TABS: 650.0000 mg | ORAL_TABLET | Freq: Four times a day (QID) | ORAL | Status: DC | PRN

## 2023-08-05 MED ORDER — LORAZEPAM 2 MG/ML IJ SOLN: 2.0000 mg | Freq: Three times a day (TID) | INTRAMUSCULAR | Status: DC | PRN

## 2023-08-05 MED ORDER — MAGNESIUM HYDROXIDE 400 MG/5ML PO SUSP: 30.0000 mL | Freq: Every day | ORAL | Status: DC | PRN

## 2023-08-05 MED ORDER — LORAZEPAM 1 MG PO TABS: 0.0000 mg | ORAL_TABLET | Freq: Four times a day (QID) | ORAL | Status: DC

## 2023-08-05 MED ORDER — HYDROXYZINE HCL 25 MG PO TABS
25.0000 mg | ORAL_TABLET | ORAL | Status: AC
  Administered 2023-08-05: 25 mg via ORAL
  Filled 2023-08-05: qty 1

## 2023-08-05 MED ORDER — DIPHENHYDRAMINE HCL 50 MG/ML IJ SOLN: 50.0000 mg | Freq: Three times a day (TID) | INTRAMUSCULAR | Status: DC | PRN

## 2023-08-05 MED ORDER — HALOPERIDOL LACTATE 5 MG/ML IJ SOLN: 5.0000 mg | Freq: Three times a day (TID) | INTRAMUSCULAR | Status: DC | PRN

## 2023-08-05 MED ORDER — THIAMINE MONONITRATE 100 MG PO TABS
100.0000 mg | ORAL_TABLET | Freq: Every day | ORAL | Status: DC
  Administered 2023-08-06 – 2023-08-10 (×5): 100 mg via ORAL
  Filled 2023-08-05 (×5): qty 1

## 2023-08-05 MED ORDER — HALOPERIDOL 5 MG PO TABS: 5.0000 mg | ORAL_TABLET | Freq: Three times a day (TID) | ORAL | Status: DC | PRN

## 2023-08-05 MED ORDER — NICOTINE 21 MG/24HR TD PT24: 21.0000 mg | MEDICATED_PATCH | Freq: Every day | TRANSDERMAL | Status: DC

## 2023-08-05 MED ORDER — TRAZODONE HCL 50 MG PO TABS
50.0000 mg | ORAL_TABLET | Freq: Every evening | ORAL | Status: DC | PRN
  Administered 2023-08-05 – 2023-08-06 (×2): 50 mg via ORAL
  Filled 2023-08-05 (×2): qty 1

## 2023-08-05 MED ORDER — DIPHENHYDRAMINE HCL 50 MG PO CAPS: 50.0000 mg | ORAL_CAPSULE | Freq: Three times a day (TID) | ORAL | Status: DC | PRN

## 2023-08-05 MED ORDER — SODIUM CHLORIDE 0.9 % IV BOLUS: 1000.0000 mL | Freq: Once | INTRAVENOUS | Status: DC

## 2023-08-05 MED ORDER — LORAZEPAM 2 MG PO TABS: 0.0000 mg | ORAL_TABLET | Freq: Four times a day (QID) | ORAL | Status: DC

## 2023-08-05 MED ORDER — THIAMINE HCL 100 MG/ML IJ SOLN: 100.0000 mg | Freq: Every day | INTRAMUSCULAR | Status: DC

## 2023-08-05 MED ORDER — NICOTINE 21 MG/24HR TD PT24
21.0000 mg | MEDICATED_PATCH | Freq: Every day | TRANSDERMAL | Status: DC
  Administered 2023-08-05: 21 mg via TRANSDERMAL
  Filled 2023-08-05: qty 1

## 2023-08-05 MED ORDER — HALOPERIDOL LACTATE 5 MG/ML IJ SOLN: 10.0000 mg | Freq: Three times a day (TID) | INTRAMUSCULAR | Status: DC | PRN

## 2023-08-05 MED ORDER — NICOTINE 21 MG/24HR TD PT24
21.0000 mg | MEDICATED_PATCH | Freq: Every day | TRANSDERMAL | Status: DC
  Administered 2023-08-06 – 2023-08-10 (×5): 21 mg via TRANSDERMAL
  Filled 2023-08-05 (×4): qty 1

## 2023-08-05 MED ORDER — THIAMINE MONONITRATE 100 MG PO TABS
100.0000 mg | ORAL_TABLET | Freq: Every day | ORAL | Status: DC
  Administered 2023-08-05: 100 mg via ORAL
  Filled 2023-08-05: qty 1

## 2023-08-05 NOTE — ED Notes (Signed)
 Pt ambulatory to car for safe transport.

## 2023-08-05 NOTE — ED Notes (Signed)
 Safe  transport  called  for  transport  to  [employer]  [facility]

## 2023-08-05 NOTE — ED Provider Notes (Signed)
 [facility] Provider Note    Event Date/Time   First MD Initiated Contact with Patient 08/05/23 [phone]     (approximate)   History   Alcohol Intoxication and Suicidal   HPI  John [name] is a 43 y.o. male brought to the ED via EMS with a chief complaint of suicidal ideation.  Patient left without being seen yesterday evening from the ED.  Returns after drinking alcohol and making superficial cuts to his left wrist.  History of alcohol induced mood disorder.  Denies active SI/HI/AH/VH.  Voices no medical complaints.     Past Medical History   Past Medical History:  Diagnosis Date   ASD (atrial septal defect)    Bacterial infection due to H. pylori    Hypertension    Kidney stones      Active Problem List   Patient Active Problem List   Diagnosis Date Noted   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 Hx urethroplasty for urethral trauma in 2020 04/30/2019   Alcohol abuse with intoxication (HCC) 04/30/2019   Acute Femoral condyle fracture (HCC) 04/30/2019   Alcohol abuse 01/14/2018   Substance induced mood disorder (HCC) 01/14/2018     Past Surgical History   Past Surgical History:  Procedure Laterality Date   ASD REPAIR     FOOT SURGERY Right    KNEE SURGERY Right      Home Medications   Prior to Admission medications   Medication Sig Start Date End Date Taking? Authorizing Provider  ibuprofen  (ADVIL ) 600 MG tablet Take 1 tablet (600 mg total) by mouth every 8 (eight) hours as needed. With food 10/18/22   Stafford Eagles, PA-C     Allergies  Penicillins   Family History  No family history on file.   Physical Exam  Triage Vital Signs: ED Triage Vitals  Encounter Vitals Group      BP      Systolic BP Percentile      Diastolic BP Percentile      Pulse      Resp      Temp      Temp src      SpO2      Weight      Height      Head Circumference      Peak Flow      Pain Score      Pain Loc      Pain Education      Exclude from Growth Chart     Updated Vital Signs: BP 133/85   Pulse (!) 101   Temp 98.6 F (37 C) (Oral)   Resp 20   Ht 5' 6 (1.676 m)   Wt 78 kg   SpO2 95%   BMI 27.76 kg/m    General: Awake, no distress.  Intoxicated. CV:  RRR.  Good peripheral perfusion.  Resp:  Normal effort.  CTAB. Abd:  Nontender.  No distention.  Other:  Superficial lacerations to left wrist not requiring sutures.   ED Results / Procedures / Treatments  Labs (all labs ordered are listed, but only abnormal results are displayed) Labs Reviewed  COMPREHENSIVE METABOLIC PANEL WITH GFR - Abnormal; Notable for the following components:  Result Value   Potassium 3.4 (*)    Glucose, Bld 103 (*)    All other components within normal limits  ETHANOL - Abnormal; Notable for the following components:   Alcohol, Ethyl (B) 120 (*)    All other components within normal limits  CBC     EKG  None   RADIOLOGY None   Official radiology report(s): No results found.   PROCEDURES:  Critical Care performed: No  Procedures   MEDICATIONS ORDERED IN ED: Medications  LORazepam  (ATIVAN ) tablet 0-4 mg ( Oral Not Given 08/05/23 0216)  thiamine  (VITAMIN B1) tablet 100 mg (has no administration in time range)  nicotine  (NICODERM CQ  - dosed in mg/24 hours) patch 21 mg (has no administration in time range)     IMPRESSION / MDM / ASSESSMENT AND PLAN / ED COURSE  I reviewed the triage vital signs and the nursing notes.                             43 year old male presenting with suicidal thoughts and self-inflicted injury.  Contracts for safety while in the emergency department.  Will obtain basic lab work, placed on CIWA, administer nicotine  patch.  Will  consult psychiatry for evaluation.  Patient's presentation is most consistent with exacerbation of chronic illness.  The patient has been placed in psychiatric observation due to the need to provide a safe environment for the patient while obtaining psychiatric consultation and evaluation, as well as ongoing medical and medication management to treat the patient's condition.  The patient has not been placed under full IVC at this time.   Clinical Course as of 08/05/23 0220  Wed Aug 05, 2023  0219 Laboratory results unremarkable other than elevated EtOH level.  Patient is medically cleared for psychiatric evaluation. [JS]    Clinical Course User Index [JS] Norlene Beavers, MD     FINAL CLINICAL IMPRESSION(S) / ED DIAGNOSES   Final diagnoses:  Alcoholic intoxication without complication (HCC)  Depression, unspecified depression type     Rx / DC Orders   ED Discharge Orders     None        Note:  This document was prepared using Dragon voice recognition software and may include unintentional dictation errors.   Genaro Bekker J, MD 08/05/23 [phone]

## 2023-08-05 NOTE — ED Notes (Signed)
 Patient admitted to [facility] from [facility] regional ED for detox from [facility]  Patient reports drinking daily.  Patient was found intoxicated outside a business and EMS was called and picked him up.  He then reported suicidal ideation to run into traffic due to being homeless.  He presently denies avh shi or plan.  Patient cooperative with admission process.  He was oriented to unit and shown to his room.  No withdrawal at this time.  CIWA is 0

## 2023-08-05 NOTE — Consult Note (Signed)
 [facility] Consult Note  Patient Name: John [name] MRN: [id number] DOB: [birth year] DATE OF Consult: 08/05/2023  PRIMARY PSYCHIATRIC DIAGNOSES  1.  Major depressive disorder recurrent severe without psychotic features 2.  Alcohol dependence  RECOMMENDATIONS  Recommendations: Medication recommendations: Lexapro 5 mg p.o. daily for depression , Hydroxyzine  25 mg p.o. 3 times daily as needed for anxiety, Trazodone 50 mg p.o. nightly as needed for insomnia Non-Medication/therapeutic recommendations: Supportive care Is inpatient psychiatric hospitalization recommended for this patient? Yes (Explain why): Cut left wrist with suicidal intent, suicidal ideation with plan to drink himself to death Is another care setting recommended for this patient? (examples may include Crisis Stabilization Unit, Residential/Recovery Treatment, ALF/SNF, Memory Care Unit)  No (Explain why): High risk of suicide-cut left wrist with suicidal intent, suicidal ideation with plan to drink himself to death From a psychiatric perspective, is this patient appropriate for discharge to an outpatient setting/resource or other less restrictive environment for continued care?  No (Explain why): High risk of suicide-cut left wrist with suicidal intent, suicidal ideation with plan to drink himself to death Follow-Up Telepsychiatry C/L services: We will sign off for now. Please re-consult our service if needed for any concerning changes in the patient's condition, discharge planning, or questions. Communication: Treatment team members (and family members if applicable) who were involved in treatment/care discussions and planning, and with whom we spoke or engaged with via secure text/chat, include the following: April Knack, RN  Thank you for involving us  in the care of this patient. If you have any additional questions or concerns, please call [phone] and ask for me or the provider on-call.  TELEPSYCHIATRY ATTESTATION &  CONSENT  As the provider for this telehealth consult, I attest that I verified the patient's identity using two separate identifiers, introduced myself to the patient, provided my credentials, disclosed my location, and performed this encounter via a HIPAA-compliant, real-time, face-to-face, two-way, interactive audio and video platform and with the full consent and agreement of the patient (or guardian as applicable.)  Patient physical location:  . Telehealth provider physical location: home office in state of [place].  Video start time: 0920 AM (Central Time) Video end time: 0935 AM (Central Time)  IDENTIFYING DATA  John [name] is a 43 y.o. year-old male for whom a psychiatric consultation has been ordered by the primary provider. The patient was identified using two separate identifiers.  CHIEF COMPLAINT/REASON FOR CONSULT  Depression, suicidal ideation with plans   HISTORY OF PRESENT ILLNESS (HPI)  Psych consult requested for evaluation of 43 year old male with self-reported history of depression anxiety and comorbid alcohol use brought to the ED by police on 6/10, reportedly was found outside sitting at a table in front of her business, has superficial cuts to the left wrist, has been drinking, expressed suicidal ideation.  During evaluation patient is alert oriented calm cooperative.  Reports he has been feeling depressed and having suicidal thoughts with plan to cut his wrists or drink himself to death.  Reports restless elevated to homelessness, finances, currently unemployed, feeling isolated, limited support.  Other stressors are related to his brother committing suicide last month.  Reports history of depression and anxiety, reports he has not been taking his medications, self-medicating with alcohol, denies any other substance use.  Currently reports mild withdrawal symptoms.  Denies perceptual disturbances, no delusion elicited.  Denies symptoms suggestive of mania.   PAST  PSYCHIATRIC HISTORY   Otherwise as per HPI above.  PAST MEDICAL HISTORY  Past Medical History:  Diagnosis Date   ASD (atrial septal defect)    Bacterial infection due to H. pylori    Hypertension    Kidney stones      HOME MEDICATIONS  Facility Ordered Medications  Medication   LORazepam  (ATIVAN ) tablet 0-4 mg   thiamine  (VITAMIN B1) tablet 100 mg   nicotine  (NICODERM CQ  - dosed in mg/24 hours) patch 21 mg   PTA Medications  Medication Sig   traZODone (DESYREL) 50 MG tablet Take 50 mg by mouth at bedtime. TAKE 1 TABLETS BY MOUTH EVERY NIGHT AT BEDTIMES AS NEEDED FOR SLEEP     ALLERGIES  Allergies  Allergen Reactions   Penicillins     Did it involve swelling of the face/tongue/throat, SOB, or low BP? No Did it involve sudden or severe rash/hives, skin peeling, or any reaction on the inside of your mouth or nose? No Did you need to seek medical attention at a hospital or doctor's office? Unknown When did it last happen?       If all above answers are "NO", may proceed with cephalosporin use.    SOCIAL & SUBSTANCE USE HISTORY  Social History   Socioeconomic History   Marital status: Single    Spouse name: Not on file   Number of children: Not on file   Years of education: Not on file   Highest education level: Not on file  Occupational History   Not on file  Tobacco Use   Smoking status: Every Day    Types: Cigarettes   Smokeless tobacco: Never   Tobacco comments:    down to 2 cigarettes a day  Vaping Use   Vaping status: Never Used  Substance and Sexual Activity   Alcohol use: Yes    Comment: Last consumed Friday 06/10/19, pt states he is an alcoholic and wants to drink   Drug use: Not Currently   Sexual activity: Yes    Birth control/protection: None  Other Topics Concern   Not on file  Social History Narrative   Not on file   Social Drivers of Health   Financial Resource Strain: High Risk (08/10/2019)   Overall Financial Resource Strain (CARDIA)     Difficulty of Paying Living Expenses: Very hard  Food Insecurity: No Food Insecurity (08/10/2019)   Hunger Vital Sign    Worried About Running Out of Food in the Last Year: Never true    Ran Out of Food in the Last Year: Never true  Transportation Needs: Unmet Transportation Needs (08/10/2019)   PRAPARE - Administrator, Civil Service (Medical): Yes    Lack of Transportation (Non-Medical): Yes  Physical Activity: Not on file  Stress: Not on file  Social Connections: Moderately Isolated (08/10/2019)   Social Connection and Isolation Panel [NHANES]    Frequency of Communication with Friends and Family: Never    Frequency of Social Gatherings with Friends and Family: Never    Attends Religious Services: 1 to 4 times per year    Active Member of [employer] or Organizations: Yes    Attends Banker Meetings: 1 to 4 times per year    Marital Status: Never married   Social History   Tobacco Use  Smoking Status Every Day   Types: Cigarettes  Smokeless Tobacco Never  Tobacco Comments   down to 2 cigarettes a day   Social History   Substance and Sexual Activity  Alcohol Use Yes   Comment: Last consumed Friday 06/10/19, pt states he is an  alcoholic and wants to drink   Social History   Substance and Sexual Activity  Drug Use Not Currently    Additional pertinent information .  FAMILY HISTORY  No family history on file. Family Psychiatric History (if known):    MENTAL STATUS EXAM (MSE)  Mental Status Exam: General Appearance: Hospital gown  Orientation:  Full (Time, Place, and Person)  Memory:  Intact  Concentration:  Concentration: Fair and Attention Span: Fair  Recall:  Fair  Attention  Fair  Eye Contact:  Fair  Speech:  Normal Rate  Language:  Fair  Volume:  Normal  Mood: depressed  Affect:  Depressed  Thought Process:  Coherent  Thought Content:  Logical  Suicidal Thoughts:  Yes.  with intent/plan  Homicidal Thoughts:  No  Judgement:  Impaired   Insight:  Fair  Psychomotor Activity:  Normal  Akathisia:  No  Fund of Knowledge:  Fair    Assets:  Manufacturing systems engineer Desire for Improvement  Cognition:  WNL  ADL's:  Intact  AIMS (if indicated):       VITALS  Blood pressure 133/85, pulse (!) 101, temperature 98.6 F (37 C), temperature source Oral, resp. rate 20, height 5' 6 (1.676 m), weight 78 kg, SpO2 95%.  LABS  Admission on 08/05/2023  Component Date Value Ref Range Status   WBC 08/05/2023 8.8  4.0 - 10.5 K/uL Final   RBC 08/05/2023 5.18  4.22 - 5.81 MIL/uL Final   Hemoglobin 08/05/2023 15.9  13.0 - 17.0 g/dL Final   HCT 54/10/8117 47.6  39.0 - 52.0 % Final   MCV 08/05/2023 91.9  80.0 - 100.0 fL Final   MCH 08/05/2023 30.7  26.0 - 34.0 pg Final   MCHC 08/05/2023 33.4  30.0 - 36.0 g/dL Final   RDW 14/78/2956 14.7  11.5 - 15.5 % Final   Platelets 08/05/2023 278  150 - 400 K/uL Final   nRBC 08/05/2023 0.0  0.0 - 0.2 % Final   Performed at [facility], [address] [place], [place] [address]   Sodium 08/05/2023 137  135 - 145 mmol/L Final   Potassium 08/05/2023 3.4 (L)  3.5 - 5.1 mmol/L Final   Chloride 08/05/2023 104  98 - 111 mmol/L Final   CO2 08/05/2023 22  22 - 32 mmol/L Final   Glucose, Bld 08/05/2023 103 (H)  70 - 99 mg/dL Final   Glucose reference range applies only to samples taken after fasting for at least 8 hours.   BUN 08/05/2023 12  6 - 20 mg/dL Final   Creatinine, Ser 08/05/2023 0.79  0.61 - 1.24 mg/dL Final   Calcium  08/05/2023 9.1  8.9 - 10.3 mg/dL Final   Total Protein 65/78/4696 7.8  6.5 - 8.1 g/dL Final   Albumin 29/52/8413 4.1  3.5 - 5.0 g/dL Final   AST 24/40/1027 27  15 - 41 U/L Final   ALT 08/05/2023 21  0 - 44 U/L Final   Alkaline Phosphatase 08/05/2023 72  38 - 126 U/L Final   Total Bilirubin 08/05/2023 1.0  0.0 - 1.2 mg/dL Final   GFR, Estimated 08/05/2023 >60  >60 mL/min Final   Comment: (NOTE) Calculated using the CKD-EPI Creatinine Equation (2021)    Anion gap  08/05/2023 11  5 - 15 Final   Performed at [facility], [address] [place], [place] [address]   Alcohol, Ethyl (B) 08/05/2023 120 (H)  <15 mg/dL Final   Comment: (NOTE) For medical purposes only. Performed at [facility], [address] [place], [place] [address]     PSYCHIATRIC REVIEW OF SYSTEMS (ROS)  ROS: Notable for the following relevant positive findings: ROS  Additional findings:      Musculoskeletal: No abnormal movements observed      Gait & Station: Normal      Pain Screening: Denies      Nutrition & Dental Concerns: no  RISK FORMULATION/ASSESSMENT  Is the patient experiencing any suicidal or homicidal ideations: Yes       Explain if yes: Superficial cut to left wrist with suicidal intent, suicidal ideation with plan to drink himself to death. Protective factors considered for safety management: In hospital, constant observation  Risk factors/concerns considered for safety management:  Prior attempt Family history of suicide Depression Substance abuse/dependence Physical illness/chronic pain Recent loss Hopelessness Isolation Barriers to accessing treatment Male gender Unmarried  Is there a safety management plan with the patient and treatment team to minimize risk factors and promote protective factors: Yes           Explain: Inpatient psychiatric stabilization Is crisis care placement or psychiatric hospitalization recommended: Yes     Based on my current evaluation and risk assessment, patient is determined at this time to be at:  High risk  *RISK ASSESSMENT Risk assessment is a dynamic process; it is possible that this patient's condition, and risk level, may change. This should be re-evaluated and managed over time as appropriate. Please re-consult psychiatric consult services if additional assistance is needed in terms of risk assessment and management. If your team decides to discharge this patient, please advise the patient how  to best access emergency psychiatric services, or to call 911, if their condition worsens or they feel unsafe in any way.   Juri Dinning D Marene Gilliam, MD Telepsychiatry Consult Services

## 2023-08-05 NOTE — ED Notes (Signed)
 Pt is observed watching television in the dayroom and talking to other patients. Pt denies SI/HI/AVH. Pt c/o anxiety and trouble sleeping. PRN trazodone given as per NP orders. ETO Atarax  given as per order. Pt seen attending group meeting. Pt is safe on the unit at this time.

## 2023-08-05 NOTE — ED Notes (Addendum)
 Belongings:  Data processing manager belt 1 pair boxers Tan pants White socks Black backpack with clothes, home meds, and body wash inside Cell phone Black shoes  White gold chain White gold chain with cross on it [employer] 2 lighters Chapstick $12.62 cents

## 2023-08-05 NOTE — ED Notes (Signed)
 [place] sandwich, two apple juices, water, and a slice of pumpkin pie given to patient.

## 2023-08-05 NOTE — Group Note (Signed)
 Group Topic: Relapse and Recovery  Group Date: 08/05/2023 Start Time: 2000 End Time: 2030 Facilitators: Dee Farber D, NT  Department: [facility]  Number of Participants: 7  Group Focus: substance abuse education Treatment Modality:  Psychoeducation Interventions utilized were story telling Purpose: relapse prevention strategies  Name: John [name] Date of Birth: 06-04-1980  MR: [id number]    Level of Participation: moderate Quality of Participation: attentive Interactions with others: gave feedback Mood/Affect: appropriate Triggers (if applicable): n/a Cognition: coherent/clear Progress: Significant Response: n/a Plan: follow-up needed  Patients Problems:  Patient Active Problem List   Diagnosis Date Noted   MDD (major depressive disorder) 08/05/2023   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 Hx urethroplasty for urethral trauma in 2020 04/30/2019   Alcohol abuse with intoxication (HCC) 04/30/2019   Acute Femoral condyle fracture (HCC) 04/30/2019   Alcohol abuse 01/14/2018   Substance induced mood disorder (HCC) 01/14/2018

## 2023-08-05 NOTE — BH Assessment (Signed)
 Comprehensive Clinical Assessment (CCA) Note  08/05/2023 John [name] [id number]  Chief Complaint: Patient is a 43 year old male presenting to [facility] ED voluntarily. Per triage note Pt arrives by EMS and appears intoxicated. Pt was found outside sitting at a table in front of a business tonight. Pt states he was in the lobby but did not want to wait so he left to go smoke and drink. EMS reports pt was suicidal yesterday and has a superficial cut to the left wrist. Pt reports the cut was intentional. Pt denies SI and HI right now but states he had SI earlier. Pt states he drank 6 forty ounces. During assessment patient appeared alert and oriented x4, calm and cooperative but still appears impaired from his alcohol use. Patient reports continued feelings of suicidal, he reports his symptoms contribute to him currently being homeless, he reports I don't like being on the street, he reports being homeless since this year and is currently unemployed. Patient reports receiving medication management from the [facility] clinic and has no therapist. Patient reports drinking alcohol daily, 5-6 40oz beers. Patient reports trying detox in the past. Patient reports continued SI with a plan to walk into traffic.  Chief Complaint  Patient presents with   Alcohol Intoxication   Suicidal   Visit Diagnosis: Alcohol use disorder, severe. Substance-induced mood disorder   CCA Screening, Triage and Referral (STR)  Patient Reported Information How did you hear about us ? Self  Referral name: No data recorded Referral phone number: No data recorded  Whom do you see for routine medical problems? No data recorded Practice/Facility Name: No data recorded Practice/Facility Phone Number: No data recorded Name of Contact: No data recorded Contact Number: No data recorded Contact Fax Number: No data recorded Prescriber Name: No data recorded Prescriber Address (if known): No data recorded  What Is the  Reason for Your Visit/Call Today? Pt arrives by EMS and appears intoxicated. Pt was found outside sitting at a table in front of a business tonight. Pt states he was in the lobby but did not want to wait so he left to go smoke and drink. EMS reports pt was suicidal yesterday and has a superficial cut to the left wrist. Pt reports the cut was intentional. Pt denies SI and HI right now but states he had SI earlier. Pt states he drank 6 forty ounces.  How Long Has This Been Causing You Problems? > than 6 months  What Do You Feel Would Help You the Most Today? Treatment for Depression or other mood problem   Have You Recently Been in Any Inpatient Treatment (Hospital/Detox/Crisis Center/28-Day Program)? No data recorded Name/Location of Program/Hospital:No data recorded How Long Were You There? No data recorded When Were You Discharged? No data recorded  Have You Ever Received Services From [facility] Before? No data recorded Who Do You See at [facility]? No data recorded  Have You Recently Had Any Thoughts About Hurting Yourself? Yes  Are You Planning to Commit Suicide/Harm Yourself At This time? Yes   Have you Recently Had Thoughts About Hurting Someone Marigene Shoulder? No  Explanation: No data recorded  Have You Used Any Alcohol or Drugs in the Past 24 Hours? Yes  How Long Ago Did You Use Drugs or Alcohol? No data recorded What Did You Use and How Much? Alcohol 5-6 40oz beers   Do You Currently Have a Therapist/Psychiatrist? No  Name of Therapist/Psychiatrist: No data recorded  Have You Been Recently Discharged From Any  Public relations account executive or Programs? No  Explanation of Discharge From Practice/Program: No data recorded    CCA Screening Triage Referral Assessment Type of Contact: Face-to-Face  Is this Initial or Reassessment? No data recorded Date Telepsych consult ordered in [facility]:  No data recorded Time Telepsych consult ordered in [facility]:  No data recorded  Patient Reported  Information Reviewed? No data recorded Patient Left Without Being Seen? No data recorded Reason for Not Completing Assessment: No data recorded  Collateral Involvement: No data recorded  Does Patient Have a Court Appointed Legal Guardian? No data recorded Name and Contact of Legal Guardian: No data recorded If Minor and Not Living with Parent(s), Who has Custody? No data recorded Is CPS involved or ever been involved? Never  Is APS involved or ever been involved? Never   Patient Determined To Be At Risk for Harm To Self or Others Based on Review of Patient Reported Information or Presenting Complaint? Yes, for Self-Harm  Method: No data recorded Availability of Means: No data recorded Intent: No data recorded Notification Required: No data recorded Additional Information for Danger to Others Potential: No data recorded Additional Comments for Danger to Others Potential: No data recorded Are There Guns or Other Weapons in Your Home? No  Types of Guns/Weapons: No data recorded Are These Weapons Safely Secured?                            No  Who Could Verify You Are Able To Have These Secured: No data recorded Do You Have any Outstanding Charges, Pending Court Dates, Parole/Probation? No data recorded Contacted To Inform of Risk of Harm To Self or Others: No data recorded  Location of Assessment: [facility] ED   Does Patient Present under Involuntary Commitment? No  IVC Papers Initial File Date: No data recorded  [place] of Residence: [place]   Patient Currently Receiving the Following Services: Not Receiving Services   Determination of Need: Emergent (2 hours)   Options For Referral: ED Visit     CCA Biopsychosocial Intake/Chief Complaint:  No data recorded Current Symptoms/Problems: No data recorded  Patient Reported Schizophrenia/Schizoaffective Diagnosis in Past: No   Strengths: Some insight, seeking help and polite.  Preferences: No data recorded Abilities:  No data recorded  Type of Services Patient Feels are Needed: No data recorded  Initial Clinical Notes/Concerns: No data recorded  Mental Health Symptoms Depression:  Worthlessness; Difficulty Concentrating; Change in energy/activity; Fatigue; Hopelessness   Duration of Depressive symptoms: Greater than two weeks   Mania:  None   Anxiety:   N/A   Psychosis:  None   Duration of Psychotic symptoms: No data recorded  Trauma:  N/A   Obsessions:  N/A   Compulsions:  N/A   Inattention:  N/A   Hyperactivity/Impulsivity:  N/A   Oppositional/Defiant Behaviors:  N/A   Emotional Irregularity:  N/A   Other Mood/Personality Symptoms:  No data recorded   Mental Status Exam Appearance and self-care  Stature:  Average   Weight:  Average weight   Clothing:  Disheveled   Grooming:  Neglected   Cosmetic use:  None   Posture/gait:  Normal   Motor activity:  Not Remarkable (Within normal range)   Sensorium  Attention:  Normal   Concentration:  Normal   Orientation:  X5   Recall/memory:  Normal   Affect and Mood  Affect:  Full Range   Mood:  Depressed   Relating  Eye contact:  Normal  Facial expression:  Responsive   Attitude toward examiner:  Cooperative   Thought and Language  Speech flow: Clear and Coherent; Normal   Thought content:  Appropriate to Mood and Circumstances   Preoccupation:  None   Hallucinations:  None   Organization:  No data recorded  [employer] of Knowledge:  Fair   Intelligence:  Average   Abstraction:  Functional; Normal   Judgement:  Fair; Impaired   Reality Testing:  Adequate   Insight:  Poor   Decision Making:  Impulsive   Social Functioning  Social Maturity:  Self-centered   Social Judgement:  Heedless; Chief of Staff   Stress  Stressors:  Housing; Transitions   Coping Ability:  Overwhelmed; Deficient supports   Skill Deficits:  None   Supports:  Support needed      Religion: Religion/Spirituality Are You A Religious Person?: No  Leisure/Recreation: Leisure / Recreation Do You Have Hobbies?: No  Exercise/Diet: Exercise/Diet Do You Exercise?: No Have You Gained or Lost A Significant Amount of Weight in the Past Six Months?: No Do You Follow a Special Diet?: No Do You Have Any Trouble Sleeping?: No   CCA Employment/Education Employment/Work Situation: Employment / Work Situation Employment Situation: Unemployed Patient's Job has Been Impacted by Current Illness: No Has Patient ever Been in Equities trader?: No  Education: Education Did You Have An Individualized Education Program (IIEP): No Did You Have Any Difficulty At [employer]?: No Patient's Education Has Been Impacted by Current Illness: No   CCA Family/Childhood History Family and Relationship History: Family history Marital status: Single Does patient have children?: No  Childhood History:  Childhood History By whom was/is the patient raised?: Mother Did patient suffer any verbal/emotional/physical/sexual abuse as a child?: No Did patient suffer from severe childhood neglect?: No Has patient ever been sexually abused/assaulted/raped as an adolescent or adult?: No Was the patient ever a victim of a crime or a disaster?: No Witnessed domestic violence?: No Has patient been affected by domestic violence as an adult?: No  Child/Adolescent Assessment:     CCA Substance Use Alcohol/Drug Use: Alcohol / Drug Use Pain Medications: See MAR Prescriptions: See MAR Over the Counter: See MAR History of alcohol / drug use?: Yes Longest period of sobriety (when/how long): Unable to quantify Negative Consequences of Use: Personal relationships, Financial Withdrawal Symptoms: Agitation Substance #1 Name of Substance 1: Alcohol 1 - Age of First Use: Unknown 1 - Amount (size/oz): 5-6 40oz beers 1 - Frequency: daily 1 - Duration: unknown 1 - Last Use / Amount: 08/05/23 1- Route  of Use: purchasing Substance #2 Name of Substance 2: Cannabis 2 - Age of First Use: Unable to Quantify 2 - Amount (size/oz): Unable to Quantify 2 - Frequency: Unable to Quantify 2 - Duration: Unable to Quantify 2 - Last Use / Amount: 01/13/2018                     ASAM's:  Six Dimensions of Multidimensional Assessment  Dimension 1:  Acute Intoxication and/or Withdrawal Potential:   Dimension 1:  Description of individual's past and current experiences of substance use and withdrawal: Pt is a high risk drinker with risk of withdrawal sx  Dimension 2:  Biomedical Conditions and Complications:      Dimension 3:  Emotional, Behavioral, or Cognitive Conditions and Complications:     Dimension 4:  Readiness to Change:     Dimension 5:  Relapse, Continued use, or Continued Problem Potential:  Dimension 6:  Recovery/Living Environment:     ASAM Severity Score: ASAM's Severity Rating Score: 14  ASAM Recommended Level of Treatment: ASAM Recommended Level of Treatment: Level III Residential Treatment   Substance use Disorder (SUD) Substance Use Disorder (SUD)  Checklist Symptoms of Substance Use: Continued use despite having a persistent/recurrent physical/psychological problem caused/exacerbated by use, Continued use despite persistent or recurrent social, interpersonal problems, caused or exacerbated by use, Evidence of tolerance, Presence of craving or strong urge to use, Social, occupational, recreational activities given up or reduced due to use, Substance(s) often taken in larger amounts or over longer times than was intended, Recurrent use that results in a failure to fulfill major role obligations (work, school, home), Persistent desire or unsuccessful efforts to cut down or control use  Recommendations for Services/Supports/Treatments:    DSM5 Diagnoses: Patient Active Problem List   Diagnosis Date Noted   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 Hx urethroplasty for urethral trauma in 2020 04/30/2019   Alcohol abuse with intoxication (HCC) 04/30/2019   Acute Femoral condyle fracture (HCC) 04/30/2019   Alcohol abuse 01/14/2018   Substance induced mood disorder (HCC) 01/14/2018    Patient Centered Plan: Patient is on the following Treatment Plan(s):  Depression and Substance Abuse   Referrals to Alternative Service(s): Referred to Alternative Service(s):   Place:   Date:   Time:    Referred to Alternative Service(s):   Place:   Date:   Time:    Referred to Alternative Service(s):   Place:   Date:   Time:    Referred to Alternative Service(s):   Place:   Date:   Time:      @[email or web address] @  [employer], LCAS-A

## 2023-08-05 NOTE — BH Assessment (Signed)
 Pt has been accepted to [facility] . Bed assignment 157:   Pt meets inpatient criteria per Dr Karla Oven  Attending Physician will be Dr. Genita Keys  Report can be called to: - Adult unit: [phone]  Pt can arrive anytime.  Care Team Notified: [facility]  Kathryn Parish, A. Beau Bound, RN, Buck Carbon, RN,

## 2023-08-05 NOTE — ED Triage Notes (Signed)
 Pt arrives by EMS and appears intoxicated. Pt was found outside sitting at a table in front of a business tonight. Pt states he was in the lobby but did not want to wait so he left to go smoke and drink. EMS reports pt was suicidal yesterday and has a superficial cut to the left wrist. Pt reports the cut was intentional. Pt denies SI and HI right now but states he had SI earlier. Pt states he drank 6 forty ounces.

## 2023-08-05 NOTE — ED Notes (Addendum)
 Report given to Lorane Rocker RN for pt admission

## 2023-08-06 DIAGNOSIS — F1210 Cannabis abuse, uncomplicated: Secondary | ICD-10-CM | POA: Diagnosis not present

## 2023-08-06 DIAGNOSIS — F10230 Alcohol dependence with withdrawal, uncomplicated: Secondary | ICD-10-CM | POA: Diagnosis not present

## 2023-08-06 DIAGNOSIS — F10229 Alcohol dependence with intoxication, unspecified: Secondary | ICD-10-CM | POA: Diagnosis not present

## 2023-08-06 DIAGNOSIS — F1024 Alcohol dependence with alcohol-induced mood disorder: Secondary | ICD-10-CM | POA: Diagnosis not present

## 2023-08-06 MED ORDER — GABAPENTIN 100 MG PO CAPS
100.0000 mg | ORAL_CAPSULE | Freq: Three times a day (TID) | ORAL | Status: DC
  Administered 2023-08-06 – 2023-08-07 (×4): 100 mg via ORAL
  Filled 2023-08-06 (×4): qty 1

## 2023-08-06 MED ORDER — LORAZEPAM 1 MG PO TABS: 0.0000 mg | ORAL_TABLET | Freq: Four times a day (QID) | ORAL | Status: AC | PRN

## 2023-08-06 MED ORDER — POTASSIUM CHLORIDE 20 MEQ PO PACK
40.0000 meq | PACK | Freq: Once | ORAL | Status: AC
  Administered 2023-08-06: 40 meq via ORAL
  Filled 2023-08-06: qty 2

## 2023-08-06 MED ORDER — NALTREXONE HCL 50 MG PO TABS
50.0000 mg | ORAL_TABLET | Freq: Every day | ORAL | Status: DC
  Administered 2023-08-06 – 2023-08-10 (×5): 50 mg via ORAL
  Filled 2023-08-06 (×5): qty 1

## 2023-08-06 MED ORDER — HYDROXYZINE HCL 25 MG PO TABS
25.0000 mg | ORAL_TABLET | Freq: Three times a day (TID) | ORAL | Status: DC | PRN
  Administered 2023-08-06 – 2023-08-07 (×3): 25 mg via ORAL
  Filled 2023-08-06 (×3): qty 1

## 2023-08-06 NOTE — ED Notes (Signed)
 Patient is asleep in bed without issue or complaint.  No distress.  Will monitor and meet needs as they arise.

## 2023-08-06 NOTE — ED Notes (Signed)
 Patient resting quietly in bed with eyes closed. Unlabored breathing. NAD noted. Facility protocol safety checks in place. Pt is currently safe on the unit.

## 2023-08-06 NOTE — ED Notes (Signed)
 Patient is in the bedroom sleeping. NAD Environment secured. Will continue to monitor for safety

## 2023-08-06 NOTE — Group Note (Signed)
 Group Topic: Relaxation  Group Date: 08/06/2023 Start Time: 1330 End Time: 1350 Facilitators: Arlan Belling, RN  Department: [facility]  Number of Participants: 8  Group Focus: social skills Treatment Modality:  Behavior Modification Therapy Interventions utilized were leisure development Purpose: increase insight, regain self-worth, and reinforce self-care  Name: John [name] Date of Birth: 11-26-1980  MR: [id number]    Level of Participation: minimal Quality of Participation: attentive Interactions with others: gave feedback Mood/Affect: appropriate Triggers (if applicable):   Cognition: coherent/clear Progress: Gaining insight Response:   Plan: follow-up needed  Patients Problems:  Patient Active Problem List   Diagnosis Date Noted   MDD (major depressive disorder) 08/05/2023   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 Hx urethroplasty for urethral trauma in 2020 04/30/2019   Alcohol abuse with intoxication (HCC) 04/30/2019   Acute Femoral condyle fracture (HCC) 04/30/2019   Alcohol abuse 01/14/2018   Substance induced mood disorder (HCC) 01/14/2018

## 2023-08-06 NOTE — ED Notes (Signed)
 Patient in the dayroom calm and pleasant watching TV with other colleagues. NAD. Denies needing anything at the moment. Respirations even and unlabored. Will monitor for safety.

## 2023-08-06 NOTE — ED Provider Notes (Addendum)
 Facility Based Crisis Admission H&P  Date: 08/06/23 Patient Name: John [name] MRN: [id number] Chief Complaint: I was sad that I didn't have a place to stay so I drank  Diagnoses:  Final diagnoses:  Alcohol use disorder, severe, dependence (HCC)  Substance induced mood disorder (HCC)  Cannabis use disorder  Tobacco use disorder   HPI:  John [name] is a 43 yo male with a past psychiatric history of alcohol use disorder, bipolar disorder, MDD who presented to [facility] ED on 6/11 for suicidal ideation, self-inflicted injury and alcohol intoxication. He has a history of 1 suicide attempt via self-harm of his wrist and 1 psychiatric hospitalization. He was admitted for alcohol detoxification and crisis stabilization.      Patient is seen in the treatment team room. He is a poor historian and tangential on interview.   He reports on Monday night he was having suicidal thoughts while he was drinking so he cut his wrist as a suicide attempt. There is a superficial laceration noted on his left wrist. He reports he was feeling depressed because he was running out of money to stay at the motel. Then, he reports on Tuesday morning he was drinking all day and then he went to the ED on Tuesday night they brought him to the ED. He does not recall what happened during that time since he was intoxicated. He reports that he last drank a shot of vodka and 5-6 40 oz beers. He reports he has been drinking daily since February due to being homeless and he will drink 5-6 40 oz beers daily. His stressors include being homeless, unemployed, and his upcoming legal charges. Prior to that he reports he was sober for 6-8 months when he was staying with his uncle. He started drinking after his brother committed suicide in 49. He reports he left his uncle's house when his stepmom moved in since they don't have a great relationship. He reports he has been at [facility] previously including an 8 month stay at [facility] in  [place]. He denies a history of severe alcohol withdrawal symptoms or hospitalizations from alcohol withdrawal. He currently reports a mild headache and tremor as his withdrawal symptoms. He reports his alcohol use has led to legal issues and he has an upcoming court date on July 10th due to getting charged after becoming agitated at another detox facility. He also reports cannabis use, at least 8g/week when he has the money. He also reports smoking cigarettes daily. He denies any other substance use.   Patient reports psychiatric symptoms of feeling depressed, decreased self-worth, increased sleep, decreased appetite in the context of his alcohol use. When asked about manic symptoms during his periods of sobriety, he denies. However, patient is noted to be hyperverbal, tangential, and inappropriate with this Clinical research associate on exam. He asks if this Clinical research associate is married which was discussed with him that the information is not relevant to his care. He also denies auditory and visual hallucinations during his periods of sobriety though he notes having hallucinations when he previously used meth. He denies history of trauma. He reports passive SI, reports his last thought was this AM when he was not able to get his medications from his locker. He was educated that medications have to come from the nurses while he is in the facility. He denies current intent or plan.   He reports a past psychiatric history of MDD, bipolar disorder, alcohol use disorder. He reports he had a past psychiatric hospitalization in  [place] over 3 years ago and that was when he was diagnosed with bipolar disorder. He reports he was admitted during a similar situation for a suicide attempt by cutting his wrist and alcohol use. He reports he was prescribed zoloft by his PCP [facility]ie [facility]) for depression but he has not been taking it. He denies having a therapist. Per chart review, patient has also previously been prescribed  depakote  500 BID, zyprexa  2.5 at bedtime. Patient also had psychiatric hospitalization in 08/2019 at [facility], he was admitted for worsening depression mixed bipolar traits and multiple admits for alcohol intoxication and withdrawals, not able to contract for safety. At that time he was discharged on depakote  1000mg  BID, gabapentin  600 TID, naltrexone  50, nortriptyline  25 at bedtime, zyprexa  15mg  at bedtime and discharged to [facility]. Per chart review, patient left his detox bed at [facility] 2 months ago after being dissatisfied with his treatment and inability to smoke.    He denies past medical history. Per chart review, has history of urethral trauma, BPH, multiple orthopedic injuries. He reports he is allergic to PCN.   He reports family history of alcoholism in his dad and paternal grandfather.   He reports that he has been homeless since February. He reports that he has a 62 yo son who is currently living with his grandfather. 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 He reports that he was in regular classes and got his GED. He reports history of multiple legal issues and the upcoming court date on July 10th. Patient has had previous charges for assault on male, communicating threats, violating protective order, resisting officers, second degree kidnapping. Discussed with patient that this could be a barrier to him getting into a rehab facility. He also declines for collateral to be contacted, reports that he does not have support.   Labs notable for: Alc 120, K 3.4, AST 27, ALT 21, Cr 0.79. CBC wnl. No UDS.  Most recent CIWA 2 for anxiety    PHQ 2-9:  Flowsheet Row ED from 08/05/2023 in [facility] from 07/19/2019 in [facility]  Thoughts that you would be better off dead, or of hurting yourself in some way Several days Not at all   PHQ-9 Total Score 8 10    Flowsheet Row ED from 08/05/2023 in [facility] Most recent reading at 08/05/2023  4:13 PM ED from 08/05/2023 in [facility] Emergency Department at [facility] Most recent reading at 08/05/2023 12:55 AM ED from 07/23/2023 in [facility] Emergency Department at [facility] Most recent reading at 07/23/2023  1:50 PM  C-SSRS RISK CATEGORY High Risk High Risk No Risk    Screenings    Flowsheet Row Most Recent Value  CIWA-Ar Total 2    Musculoskeletal  Strength & Muscle Tone: within normal limits Gait & Station: normal Patient leans: N/A  Psychiatric Specialty Exam  Presentation General Appearance: Casual  Eye Contact:Fair  Speech:Clear and Coherent  Speech Volume:Normal  Mood and Affect  Mood:Labile  Affect:Congruent   Thought Process  Thought Processes:Coherent  Descriptions of Associations:Intact  Orientation:Full (Time, Place and Person)  Thought Content:Tangential  Diagnosis of Schizophrenia or Schizoaffective disorder in past: No   Hallucinations:Hallucinations: None  Ideas of Reference:None  Suicidal Thoughts:Suicidal Thoughts: Yes, Passive SI Passive Intent and/or Plan: Without Intent  Homicidal Thoughts:Homicidal  Thoughts: No   Sensorium  Memory:Remote Poor  Judgment:Intact  Insight:Lacking   Executive Functions  Concentration:Fair  Attention Span:Fair  Recall:Poor  Fund of Knowledge:Fair  Language:Fair   Psychomotor Activity  Psychomotor Activity:Psychomotor Activity: Normal   Assets  Assets:Communication Skills; Physical Health; Resilience   Sleep  Sleep:Sleep: Fair  Physical Exam ROS Physical Exam Constitutional:      Appearance: the patient is not toxic-appearing.  Pulmonary:     Effort: Pulmonary effort is normal.  Neurological:     General: No focal deficit present.     Mental Status: the patient is alert and oriented to person, place, and time.    Review of Systems  Respiratory:  Negative for shortness of breath.   Cardiovascular:  Negative for chest pain.  Gastrointestinal:  Negative for abdominal pain, constipation, diarrhea, nausea and vomiting.  Neurological:  Negative for headaches.  Skin: superficial laceration noted on left wrist  Blood pressure 129/87, pulse 88, temperature 97.7 F (36.5 C), temperature source Oral, resp. rate 17, SpO2 96%. There is no height or weight on file to calculate BMI.  Past Psychiatric History: He reports a past psychiatric history of MDD, bipolar disorder, alcohol use disorder. He reports he had a past psychiatric hospitalization in [place] over 3 years ago and that was when he was diagnosed with bipolar disorder. He reports he was admitted during a similar situation for a suicide attempt by cutting his wrist and alcohol use. He reports he was prescribed zoloft by his PCP [facility]ie [facility]) for depression but he has not been taking it. He denies having a therapist. Per chart review, patient has also previously been prescribed depakote  500 BID, zyprexa  2.5 at bedtime. Patient also had psychiatric hospitalization in 08/2019 at [facility], he was admitted for worsening depression mixed bipolar traits and multiple admits for alcohol intoxication and withdrawals, not able to contract for safety. At that time he was discharged on depakote  1000mg  BID, gabapentin  600 TID, naltrexone  50, nortriptyline  25 at bedtime, zyprexa  15mg  at bedtime and discharged to [facility]. Per chart review, patient left his detox bed at [facility] 2 months ago after being dissatisfied with his treatment and inability to smoke.     Is the patient at risk to self? Yes  Has the patient been a risk to self in the past 6 months? Yes .    Has the patient been a risk to self within the distant past? Yes   Is the patient a risk to others? No   Has the patient been a risk to others in the past 6 months? Yes   Has the  patient been a risk to others within the distant past? Yes   Past Medical History: He denies past medical history. Per chart review, has history of urethral trauma, BPH, multiple orthopedic injuries. He reports he is allergic to PCN.   Family History: He reports family history of alcoholism in his dad and paternal grandfather.   Social History: He reports that he has been homeless since February. He reports that he has a 12 yo son who is currently living with his grandfather. 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 He reports that he was in regular classes and got his GED. He reports history of multiple legal issues and the upcoming court date on July 10th. Discussed with patient that this could be a barrier to him getting  into a rehab facility. He also declines for collateral to be contacted, reports that he does not have support.   Last Labs:  Admission on 08/05/2023, Discharged on 08/05/2023  Component Date Value Ref Range Status   WBC 08/05/2023 8.8  4.0 - 10.5 K/uL Final   RBC 08/05/2023 5.18  4.22 - 5.81 MIL/uL Final   Hemoglobin 08/05/2023 15.9  13.0 - 17.0 g/dL Final   HCT 86/57/8469 47.6  39.0 - 52.0 % Final   MCV 08/05/2023 91.9  80.0 - 100.0 fL Final   MCH 08/05/2023 30.7  26.0 - 34.0 pg Final   MCHC 08/05/2023 33.4  30.0 - 36.0 g/dL Final   RDW 62/95/2841 14.7  11.5 - 15.5 % Final   Platelets 08/05/2023 278  150 - 400 K/uL Final   nRBC 08/05/2023 0.0  0.0 - 0.2 % Final   Performed at [facility], [address] [place], [place] [address]   Sodium 08/05/2023 137  135 - 145 mmol/L Final   Potassium 08/05/2023 3.4 (L)  3.5 - 5.1 mmol/L Final   Chloride 08/05/2023 104  98 - 111 mmol/L Final   CO2 08/05/2023 22  22 - 32 mmol/L Final   Glucose, Bld 08/05/2023 103 (H)  70 - 99 mg/dL Final   Glucose reference range applies only to samples taken after fasting for at least 8 hours.   BUN  08/05/2023 12  6 - 20 mg/dL Final   Creatinine, Ser 08/05/2023 0.79  0.61 - 1.24 mg/dL Final   Calcium  08/05/2023 9.1  8.9 - 10.3 mg/dL Final   Total Protein 12/21/2534 7.8  6.5 - 8.1 g/dL Final   Albumin 64/40/3474 4.1  3.5 - 5.0 g/dL Final   AST 25/95/6387 27  15 - 41 U/L Final   ALT 08/05/2023 21  0 - 44 U/L Final   Alkaline Phosphatase 08/05/2023 72  38 - 126 U/L Final   Total Bilirubin 08/05/2023 1.0  0.0 - 1.2 mg/dL Final   GFR, Estimated 08/05/2023 >60  >60 mL/min Final   Comment: (NOTE) Calculated using the CKD-EPI Creatinine Equation (2021)    Anion gap 08/05/2023 11  5 - 15 Final   Performed at [facility], [address] [place], [place] [address]   Alcohol, Ethyl (B) 08/05/2023 120 (H)  <15 mg/dL Final   Comment: (NOTE) For medical purposes only. Performed at [facility], [address] [place], [place] [address]   Admission on 07/23/2023, Discharged on 07/23/2023  Component Date Value Ref Range Status   Sodium 07/23/2023 142  135 - 145 mmol/L Final   Potassium 07/23/2023 3.6  3.5 - 5.1 mmol/L Final   Chloride 07/23/2023 105  98 - 111 mmol/L Final   CO2 07/23/2023 30  22 - 32 mmol/L Final   Glucose, Bld 07/23/2023 107 (H)  70 - 99 mg/dL Final   Glucose reference range applies only to samples taken after fasting for at least 8 hours.   BUN 07/23/2023 <5 (L)  6 - 20 mg/dL Final   Creatinine, Ser 07/23/2023 0.67  0.61 - 1.24 mg/dL Final   Calcium  07/23/2023 9.2  8.9 - 10.3 mg/dL Final   Total Protein 84/16/6063 7.3  6.5 - 8.1 g/dL Final   Albumin 01/60/1093 3.8  3.5 - 5.0 g/dL Final   AST 23/55/7322 45 (H)  15 - 41 U/L Final   ALT 07/23/2023 37  0 - 44 U/L Final   Alkaline Phosphatase 07/23/2023 77  38 - 126 U/L  Final   Total Bilirubin 07/23/2023 0.8  0.0 - 1.2 mg/dL Final   GFR, Estimated 07/23/2023 >60  >60 mL/min Final   Comment: (NOTE) Calculated using the CKD-EPI Creatinine Equation (2021)    Anion gap 07/23/2023 7  5 - 15 Final    Performed at [facility], [address] [place], [place] [address]   Alcohol, Ethyl (B) 07/23/2023 277 (H)  <15 mg/dL Final   Comment: (NOTE) For medical purposes only. Performed at [facility], [address] [place], [place] [address]    WBC 07/23/2023 5.6  4.0 - 10.5 K/uL Final   RBC 07/23/2023 5.51  4.22 - 5.81 MIL/uL Final   Hemoglobin 07/23/2023 17.0  13.0 - 17.0 g/dL Final   HCT 82/95/6213 49.4  39.0 - 52.0 % Final   MCV 07/23/2023 89.7  80.0 - 100.0 fL Final   MCH 07/23/2023 30.9  26.0 - 34.0 pg Final   MCHC 07/23/2023 34.4  30.0 - 36.0 g/dL Final   RDW 08/65/7846 14.7  11.5 - 15.5 % Final   Platelets 07/23/2023 236  150 - 400 K/uL Final   nRBC 07/23/2023 0.0  0.0 - 0.2 % Final   Performed at [facility], [address] [place], [place] [address]    Allergies: Penicillins  Medications:  Facility Ordered Medications  Medication   LORazepam  (ATIVAN ) tablet 0-4 mg   nicotine  (NICODERM CQ  - dosed in mg/24 hours) patch 21 mg   thiamine  (VITAMIN B1) tablet 100 mg   acetaminophen  (TYLENOL ) tablet 650 mg   alum & mag hydroxide-simeth (MAALOX/MYLANTA) 200-200-20 MG/5ML suspension 30 mL   magnesium  hydroxide (MILK OF MAGNESIA) suspension 30 mL   haloperidol  lactate (HALDOL ) injection 10 mg   And   diphenhydrAMINE (BENADRYL) injection 50 mg   And   LORazepam  (ATIVAN ) injection 2 mg   haloperidol  lactate (HALDOL ) injection 5 mg   And   diphenhydrAMINE (BENADRYL) injection 50 mg   And   LORazepam  (ATIVAN ) injection 2 mg   haloperidol  (HALDOL ) tablet 5 mg   And   diphenhydrAMINE (BENADRYL) capsule 50 mg   traZODone (DESYREL) tablet 50 mg   [COMPLETED] hydrOXYzine  (ATARAX ) tablet 25 mg   hydrOXYzine  (ATARAX ) tablet 25 mg   gabapentin  (NEURONTIN ) capsule 100 mg   naltrexone  (DEPADE) tablet 50 mg   potassium chloride  (KLOR-CON ) packet 40 mEq   Long Term Goals: Improvement in symptoms so as ready for discharge   Medical Decision Making   John [name] is a 43 yo male with a past psychiatric history of alcohol use disorder, bipolar disorder, MDD who presented to [facility] ED on 6/11 for suicidal ideation, self-inflicted injury and alcohol intoxication. He has a history of 1 suicide attempt via self-harm of his wrist and 1 psychiatric hospitalization. He was admitted for alcohol detoxification and crisis stabilization.      Patient meets criteria for alcohol use disorder and substance-induced mood disorder. Unclear regarding his history of bipolar disorder, he does seem to have some hyperverbal speech, tangentiality, inappropriate comments, impulsivity and flight of ideas on exam though hesitant to give bipolar diagnosis in the setting of withdrawal from alcohol currently. Will continue to monitor and assess mood in the following days and whether he would benefit from additional mood stabilization. Will start naltrexone  and gabapentin  for his alcohol use disorder and continue CIWA with sx-triggered ativan . Per chart review, patient has also had a history of suicidal statements made for secondary gain of housing.   Labs notable for: Alc  120, K 3.4, AST 27, ALT 21, Cr 0.79. CBC wnl. No UDS.  Labs pending: A1c, lipid panel, TSH, UDS, EKG   Status: VOL  #Alcohol use disorder #Substance-induced mood disorder -CIWA -sx-triggered ativan  -PRN: atarax , tylenol , maalox, imodium , milk of mg, zofran  -daily MV, thiamine   -start naltrexone  50mg  for alcohol use disorder -start gabapentin  100mg  TID for anxiety and alcohol use disorder, can increase as tolerated    #Tobacco use disorder -nicotine  patch ordered  #Cannabis use disorder -continue to encourage cessation   Dispo: wants residential      Recommendations  Based on my evaluation the patient does not appear to have an emergency medical condition.  Norbert Bean, MD, PGY-2 08/06/23  12:12 PM  This case was discussed with attending Dr. Genita Keys who agrees with the above formulated  treatment plan. Please see attending attestation for additional details.   This note was created using a voice recognition software as a result there may be grammatical errors inadvertently enclosed that do not reflect the nature of this encounter. Every attempt is made to correct such errors.

## 2023-08-06 NOTE — Group Note (Signed)
 Group Topic: Healthy Self Image and Positive Change  Group Date: 08/06/2023 Start Time: 1220 End Time: 1330 Facilitators: Dennis Fitting, NT  Department: [facility]  Number of Participants: 8  Group Focus: clarity of thought, daily focus, problem solving, reality orientation, and self-awareness Treatment Modality:  Psychoeducation Interventions utilized were exploration, group exercise, problem solving, and story telling Purpose: explore maladaptive thinking, express feelings, and increase insight  Name: John [name] Date of Birth: 05/17/1980  MR: [id number]    Level of Participation: active Quality of Participation: attentive and engaged Interactions with others: gave feedback Mood/Affect: appropriate Triggers (if applicable): N/A Cognition: coherent/clear Progress: Gaining insight Response: PT was quiet for the majority of the group. Plan: patient will be encouraged to continue coming to groups and calling [facility] to find placement   Patients Problems:  Patient Active Problem List   Diagnosis Date Noted   MDD (major depressive disorder) 08/05/2023   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 Hx urethroplasty for urethral trauma in 2020 04/30/2019   Alcohol abuse with intoxication (HCC) 04/30/2019   Acute Femoral condyle fracture (HCC) 04/30/2019   Alcohol abuse 01/14/2018   Substance induced mood disorder (HCC) 01/14/2018

## 2023-08-06 NOTE — Group Note (Signed)
 Group Topic: Healthy Self Image and Positive Change  Group Date: 08/06/2023 Start Time: 1914 End Time: 2000 Facilitators: Alvino Joseph, NT  Department: [facility]  Number of Participants: 8  Group Focus: feeling awareness/expression Treatment Modality:  Patient-Centered Therapy Interventions utilized were group exercise Purpose: express feelings  Name: John [name] Date of Birth: October 15, 1980  MR: [id number]    Level of Participation: active Quality of Participation: cooperative Interactions with others: gave feedback Mood/Affect: appropriate Triggers (if applicable): n/a Cognition: coherent/clear Progress: Moderate Response: appropriate Plan: follow-up needed  Patients Problems:  Patient Active Problem List   Diagnosis Date Noted   MDD (major depressive disorder) 08/05/2023   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 Hx urethroplasty for urethral trauma in 2020 04/30/2019   Alcohol abuse with intoxication (HCC) 04/30/2019   Acute Femoral condyle fracture (HCC) 04/30/2019   Alcohol abuse 01/14/2018   Substance induced mood disorder (HCC) 01/14/2018

## 2023-08-07 ENCOUNTER — Encounter (HOSPITAL_COMMUNITY): Payer: Self-pay

## 2023-08-07 DIAGNOSIS — F10229 Alcohol dependence with intoxication, unspecified: Secondary | ICD-10-CM | POA: Diagnosis not present

## 2023-08-07 DIAGNOSIS — F1024 Alcohol dependence with alcohol-induced mood disorder: Secondary | ICD-10-CM | POA: Diagnosis not present

## 2023-08-07 DIAGNOSIS — F10230 Alcohol dependence with withdrawal, uncomplicated: Secondary | ICD-10-CM | POA: Diagnosis not present

## 2023-08-07 DIAGNOSIS — F1210 Cannabis abuse, uncomplicated: Secondary | ICD-10-CM | POA: Diagnosis not present

## 2023-08-07 LAB — LIPID PANEL
Cholesterol: 278 mg/dL — ABNORMAL HIGH (ref 0–200)
HDL: 56 mg/dL (ref >40)
LDL Cholesterol: 197 mg/dL — ABNORMAL HIGH (ref 0–99)
Total CHOL/HDL Ratio: 5 ratio
Triglycerides: 125 mg/dL (ref <150)
VLDL: 25 mg/dL (ref 0–40)

## 2023-08-07 LAB — HEMOGLOBIN A1C
Hgb A1c MFr Bld: 5.2 % (ref 4.8–5.6)
Mean Plasma Glucose: 102.54 mg/dL

## 2023-08-07 LAB — TSH: TSH: 3.271 u[IU]/mL (ref 0.350–4.500)

## 2023-08-07 MED ORDER — GABAPENTIN 100 MG PO CAPS
200.0000 mg | ORAL_CAPSULE | Freq: Three times a day (TID) | ORAL | Status: DC
  Administered 2023-08-07 (×2): 200 mg via ORAL
  Filled 2023-08-07 (×3): qty 2

## 2023-08-07 MED ORDER — TRAZODONE HCL 100 MG PO TABS
100.0000 mg | ORAL_TABLET | Freq: Every evening | ORAL | Status: DC | PRN
  Administered 2023-08-07 – 2023-08-09 (×3): 100 mg via ORAL
  Filled 2023-08-07 (×3): qty 1

## 2023-08-07 NOTE — ED Notes (Signed)
 Pt is pleasant, communicative and socializing. Joined Group this afternoon and stayed the whole time and participated. Safety measures continued.

## 2023-08-07 NOTE — Group Note (Signed)
 Group Topic: Fears and Unhealthy Coping Skills  Group Date: 08/07/2023 Start Time: 2000 End Time: 2030 Facilitators: Alvino Joseph, NT  Department: [facility]  Number of Participants: 7  Group Focus: clarity of thought Treatment Modality:  Individual Therapy Interventions utilized were group exercise Purpose: express feelings  Name: John [name] Date of Birth: 1980/10/05  MR: [id number]    Level of Participation: active Quality of Participation: cooperative Interactions with others: gave feedback Mood/Affect: appropriate Triggers (if applicable): n/a Cognition: coherent/clear Progress: Moderate Response: n/a Plan: follow-up needed  Patients Problems:  Patient Active Problem List   Diagnosis Date Noted   MDD (major depressive disorder) 08/05/2023   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 Hx urethroplasty for urethral trauma in 2020 04/30/2019   Alcohol abuse with intoxication (HCC) 04/30/2019   Acute Femoral condyle fracture (HCC) 04/30/2019   Alcohol abuse 01/14/2018   Substance induced mood disorder (HCC) 01/14/2018

## 2023-08-07 NOTE — ED Notes (Signed)
 Patient is in the bedroom sleeping with eyes closed. Respirations even and unlabored. NAD Environment secured. Will continue to monitor for safety

## 2023-08-07 NOTE — Discharge Planning (Signed)
 LCSW met with patient to assess current mood, affect, physical state, and inquire about needs/goals while here in [facility] and after discharge. Patient reports he/she presented due to ongoing alcohol abuse and MDD with SI with plan to jump into traffic due to the recent stressors of becoming homeless.  Patient reports he has been living with his uncle since February. Patient denies having access to transportation, and reports having limited social support. Patient stated he has been drinking up to 6 40 oz beers and also vodka. He has legal charges pending with a court date for a citation for intoxication and disruption on July 10.  Patient reports his/her current goal is [facility] use program. Patient has been to numerous treatment facilities including [facility] [place] and was [facility] in [place] for close to a year until last inpatient substance abuse treatment. Patient currently denies any SI/HI/AVH and reports mood as ***. Patient aware that LCSW will send referrals out for review and will follow up to provide updates as received. Patient expressed understanding and appreciation of LCSW assistance. No other needs were reported at this time by patient.

## 2023-08-07 NOTE — ED Notes (Signed)
 Patient is sleeping. Respirations equal and unlabored. No change in assessment or acuity. Routine safety checks conducted according to facility protocol.

## 2023-08-07 NOTE — Group Note (Signed)
 Group Topic: Communication  Group Date: 08/07/2023 Start Time: 1230 End Time: 1300 Facilitators: Gena Kemp, RN; Rachelle Bue, RN  Department: [facility]  Number of Participants: 6  Group Focus: check in, communication, and other ice breakers Treatment Modality:  Psychoeducation Interventions utilized were group exercise and story telling Purpose: express feelings, improve communication skills, and increase insight  Name: John [name] Date of Birth: 1980-04-04  MR: [id number]    Level of Participation: active Quality of Participation: attentive and cooperative Interactions with others: gave feedback Mood/Affect: appropriate Triggers (if applicable):  Cognition: coherent/clear and insightful Progress: Gaining insight Response:  Plan: follow-up needed  Patients Problems:  Patient Active Problem List   Diagnosis Date Noted   MDD (major depressive disorder) 08/05/2023   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 Hx urethroplasty for urethral trauma in 2020 04/30/2019   Alcohol abuse with intoxication (HCC) 04/30/2019   Acute Femoral condyle fracture (HCC) 04/30/2019   Alcohol abuse 01/14/2018   Substance induced mood disorder (HCC) 01/14/2018

## 2023-08-07 NOTE — ED Provider Notes (Signed)
 Behavioral Health Progress Note  Date and Time: 08/07/2023 8:41 AM Name: John [name] MRN:  [id number]  Chart review: VSS. CIWA 0. Compliant with medications. Took PRN atarax  and trazodone .   Subjective:   Patient is seen in the [facility]. He reports difficulties with sleep initiation, states the trazodone  50 didn't help as much with sleep and requests increase to 100mg . Discussed that we could try trazodone  100mg  but also discussed sleep hygiene. He did not feel benefit from atarax . Discussed no issues with appetite. He initially reported constipation to this provider but later on was informed by nursing that patient was able to have a BM. He reports no issues with appetite. He reports decrease in depression due to currently having housing, reports his self-harm thoughts and attempt occurred when he was intoxicated. He reports increased anxiety, reports feeling anxious when he is around other people. Discussed that we could increase his gabapentin  for anxiety and for his alcohol use disorder. He otherwise denies current SI/HI/AVH. He shows this provider a list of housing options that he received from the SW and the appointments he has. He reports that he gets his check on the first of the month and he ran out of money from his check to continue paying for his motel but that he had extra money for his alcohol. He continues to make some inappropriate comments to this provider which was discussed with him that would not be acceptable. He was able to stop himself from making another inappropriate comment later in the interview.   Diagnosis:  Final diagnoses:  Alcohol use disorder, severe, dependence (HCC)  Substance induced mood disorder (HCC)  Cannabis use disorder  Tobacco use disorder    Past Psychiatric History:  He reports a past psychiatric history of MDD, bipolar disorder, alcohol use disorder. He reports he had a past psychiatric hospitalization in [place] over 3 years ago and that was when he  was diagnosed with bipolar disorder. He reports he was admitted during a similar situation for a suicide attempt by cutting his wrist and alcohol use. He reports he was prescribed zoloft by his PCP Stephenie [facility]) for depression but he has not been taking it. He denies having a therapist. Per chart review, patient has also previously been prescribed depakote  500 BID, zyprexa  2.5 at bedtime. Patient also had psychiatric hospitalization in 08/2019 at [facility], he was admitted for worsening depression mixed bipolar traits and multiple admits for alcohol intoxication and withdrawals, not able to contract for safety. At that time he was discharged on depakote  1000mg  BID, gabapentin  600 TID, naltrexone  50, nortriptyline  25 at bedtime, zyprexa  15mg  at bedtime and discharged to [facility]. Per chart review, patient [facility] months ago after being dissatisfied with his treatment and inability to smoke.     Past Medical History: He denies past medical history. Per chart review, has history of urethral trauma, BPH, multiple orthopedic injuries. He reports he is allergic to PCN   Family History: He reports family history of alcoholism in his dad and paternal grandfather.   Social History: He reports that he has been homeless since February. He reports that he has a 51 yo son who is currently living with his grandfather. 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 He reports that he was in regular classes and got his GED. He reports history of multiple legal  issues and the upcoming court date on July 10th. Discussed with patient that this could be a barrier to him getting into a rehab facility. He also declines for collateral to be contacted, reports that he does not have support.   Additional Social History:                         Current Medications:  Current  Facility-Administered Medications  Medication Dose Route Frequency Provider Last Rate Last Admin   acetaminophen  (TYLENOL ) tablet 650 mg  650 mg Oral Q6H PRN Penn, Cicely, NP       alum & mag hydroxide-simeth (MAALOX/MYLANTA) 200-200-20 MG/5ML suspension 30 mL  30 mL Oral Q4H PRN Penn, Cicely, NP       haloperidol  (HALDOL ) tablet 5 mg  5 mg Oral TID PRN Monroe Antigua, NP       And   diphenhydrAMINE  (BENADRYL ) capsule 50 mg  50 mg Oral TID PRN Monroe Antigua, NP       haloperidol  lactate (HALDOL ) injection 10 mg  10 mg Intramuscular TID PRN Penn, Alaine Howells, NP       And   diphenhydrAMINE  (BENADRYL ) injection 50 mg  50 mg Intramuscular TID PRN Penn, Alaine Howells, NP       And   LORazepam  (ATIVAN ) injection 2 mg  2 mg Intramuscular TID PRN Penn, Alaine Howells, NP       haloperidol  lactate (HALDOL ) injection 5 mg  5 mg Intramuscular TID PRN Monroe Antigua, NP       And   diphenhydrAMINE  (BENADRYL ) injection 50 mg  50 mg Intramuscular TID PRN Monroe Antigua, NP       And   LORazepam  (ATIVAN ) injection 2 mg  2 mg Intramuscular TID PRN Monroe Antigua, NP       gabapentin  (NEURONTIN ) capsule 100 mg  100 mg Oral TID Kenzie Thoreson, MD   100 mg at 08/06/23 2100   hydrOXYzine  (ATARAX ) tablet 25 mg  25 mg Oral TID PRN Clorissa Gruenberg, MD   25 mg at 08/06/23 2102   LORazepam  (ATIVAN ) tablet 0-4 mg  0-4 mg Oral Q6H PRN Norbert Bean, MD       magnesium  hydroxide (MILK OF MAGNESIA) suspension 30 mL  30 mL Oral Daily PRN Penn, Alaine Howells, NP       naltrexone  (DEPADE) tablet 50 mg  50 mg Oral Daily Laneice Meneely, MD   50 mg at 08/06/23 1218   nicotine  (NICODERM CQ  - dosed in mg/24 hours) patch 21 mg  21 mg Transdermal Daily Penn, Cicely, NP   21 mg at 08/06/23 1610   thiamine  (VITAMIN B1) tablet 100 mg  100 mg Oral Daily Penn, Cicely, NP   100 mg at 08/06/23 9604   traZODone  (DESYREL ) tablet 50 mg  50 mg Oral QHS PRN Dorthea Gauze, NP   50 mg at 08/06/23 2100   Current Outpatient Medications  Medication Sig Dispense Refill    gabapentin  (NEURONTIN ) 600 MG tablet Take 600 mg by mouth 2 (two) times daily. TAKE 1 TABLET BY MOUTH TWICE A DAY FOR 5 DAYS DURING DETOX     hydrOXYzine  (VISTARIL ) 50 MG capsule Take 50 mg by mouth every 6 (six) hours as needed for anxiety. TAKE 1 CAPSULE BY MOUTH EVERY 6 HOURS AS NEEDED FOR ANXIETY/INSOMNIA     traZODone  (DESYREL ) 50 MG tablet Take 50 mg by mouth at bedtime. TAKE 1 TABLETS BY MOUTH EVERY NIGHT AT BEDTIMES AS NEEDED FOR SLEEP  Labs  Lab Results:  Admission on 08/05/2023  Component Date Value Ref Range Status   Hgb A1c MFr Bld 08/07/2023 5.2  4.8 - 5.6 % Final   Comment: (NOTE) Diagnosis of Diabetes The following HbA1c ranges recommended by the American Diabetes Association (ADA) may be used as an aid in the diagnosis of diabetes mellitus.  Hemoglobin             Suggested A1C NGSP%              Diagnosis  <5.7                   Non Diabetic  5.7-6.4                Pre-Diabetic  >6.4                   Diabetic  <7.0                   Glycemic control for                       adults with diabetes.     Mean Plasma Glucose 08/07/2023 102.54  mg/dL Final   Performed at [facility], [address], [place], [place] [address]   Cholesterol 08/07/2023 278 (H)  0 - 200 mg/dL Final   Triglycerides 19/14/7829 125  <150 mg/dL Final   HDL 56/21/3086 56  >40 mg/dL Final   Total CHOL/HDL Ratio 08/07/2023 5.0  RATIO Final   VLDL 08/07/2023 25  0 - 40 mg/dL Final   LDL Cholesterol 08/07/2023 197 (H)  0 - 99 mg/dL Final   Comment:        Total Cholesterol/HDL:CHD Risk Coronary Heart Disease Risk Table                     Men   Women  1/2 Average Risk   3.4   3.3  Average Risk       5.0   4.4  2 X Average Risk   9.6   7.1  3 X Average Risk  23.4   11.0        Use the calculated Patient Ratio above and the CHD Risk Table to determine the patient's CHD Risk.        ATP III CLASSIFICATION (LDL):  <100     mg/dL   Optimal  578-469  mg/dL   Near or Above                     Optimal  130-159  mg/dL   Borderline  629-528  mg/dL   High  >413     mg/dL   Very High Performed at [facility], [address] [place], [place] [address]    TSH 08/07/2023 3.271  0.350 - 4.500 uIU/mL Final   Comment: Performed by a 3rd Generation assay with a functional sensitivity of <=0.01 uIU/mL. Performed at [facility], [address], [place], [place] [address]   Admission on 08/05/2023, Discharged on 08/05/2023  Component Date Value Ref Range Status   WBC 08/05/2023 8.8  4.0 - 10.5 K/uL Final   RBC 08/05/2023 5.18  4.22 - 5.81 MIL/uL Final   Hemoglobin 08/05/2023 15.9  13.0 - 17.0 g/dL Final   HCT 36/64/4034 47.6  39.0 - 52.0 % Final   MCV 08/05/2023 91.9  80.0 - 100.0 fL Final  MCH 08/05/2023 30.7  26.0 - 34.0 pg Final   MCHC 08/05/2023 33.4  30.0 - 36.0 g/dL Final   RDW 86/57/8469 14.7  11.5 - 15.5 % Final   Platelets 08/05/2023 278  150 - 400 K/uL Final   nRBC 08/05/2023 0.0  0.0 - 0.2 % Final   Performed at [facility], [address] [place], [place] [address]   Sodium 08/05/2023 137  135 - 145 mmol/L Final   Potassium 08/05/2023 3.4 (L)  3.5 - 5.1 mmol/L Final   Chloride 08/05/2023 104  98 - 111 mmol/L Final   CO2 08/05/2023 22  22 - 32 mmol/L Final   Glucose, Bld 08/05/2023 103 (H)  70 - 99 mg/dL Final   Glucose reference range applies only to samples taken after fasting for at least 8 hours.   BUN 08/05/2023 12  6 - 20 mg/dL Final   Creatinine, Ser 08/05/2023 0.79  0.61 - 1.24 mg/dL Final   Calcium  08/05/2023 9.1  8.9 - 10.3 mg/dL Final   Total Protein 84/13/2440 7.8  6.5 - 8.1 g/dL Final   Albumin 12/21/2534 4.1  3.5 - 5.0 g/dL Final   AST 64/40/3474 27  15 - 41 U/L Final   ALT 08/05/2023 21  0 - 44 U/L Final   Alkaline Phosphatase 08/05/2023 72  38 - 126 U/L Final   Total Bilirubin 08/05/2023 1.0  0.0 - 1.2 mg/dL Final   GFR, Estimated 08/05/2023 >60  >60 mL/min Final   Comment: (NOTE) Calculated using the CKD-EPI  Creatinine Equation (2021)    Anion gap 08/05/2023 11  5 - 15 Final   Performed at [facility], [address] [place], [place] [address]   Alcohol, Ethyl (B) 08/05/2023 120 (H)  <15 mg/dL Final   Comment: (NOTE) For medical purposes only. Performed at [facility], [address] [place], [place] [address]   Admission on 07/23/2023, Discharged on 07/23/2023  Component Date Value Ref Range Status   Sodium 07/23/2023 142  135 - 145 mmol/L Final   Potassium 07/23/2023 3.6  3.5 - 5.1 mmol/L Final   Chloride 07/23/2023 105  98 - 111 mmol/L Final   CO2 07/23/2023 30  22 - 32 mmol/L Final   Glucose, Bld 07/23/2023 107 (H)  70 - 99 mg/dL Final   Glucose reference range applies only to samples taken after fasting for at least 8 hours.   BUN 07/23/2023 <5 (L)  6 - 20 mg/dL Final   Creatinine, Ser 07/23/2023 0.67  0.61 - 1.24 mg/dL Final   Calcium  07/23/2023 9.2  8.9 - 10.3 mg/dL Final   Total Protein 43/32/9518 7.3  6.5 - 8.1 g/dL Final   Albumin 84/16/6063 3.8  3.5 - 5.0 g/dL Final   AST 01/60/1093 45 (H)  15 - 41 U/L Final   ALT 07/23/2023 37  0 - 44 U/L Final   Alkaline Phosphatase 07/23/2023 77  38 - 126 U/L Final   Total Bilirubin 07/23/2023 0.8  0.0 - 1.2 mg/dL Final   GFR, Estimated 07/23/2023 >60  >60 mL/min Final   Comment: (NOTE) Calculated using the CKD-EPI Creatinine Equation (2021)    Anion gap 07/23/2023 7  5 - 15 Final   Performed at [facility], [address] [place], [place] [address]   Alcohol, Ethyl (B) 07/23/2023 277 (H)  <15 mg/dL Final   Comment: (NOTE) For medical purposes only. Performed at [facility], [address], [place], [place] [address]  WBC 07/23/2023 5.6  4.0 - 10.5 K/uL Final   RBC 07/23/2023 5.51  4.22 - 5.81 MIL/uL Final   Hemoglobin 07/23/2023 17.0  13.0 - 17.0 g/dL Final   HCT 16/11/9602 49.4  39.0 - 52.0 % Final   MCV 07/23/2023 89.7  80.0 - 100.0 fL Final   MCH 07/23/2023 30.9  26.0 - 34.0 pg  Final   MCHC 07/23/2023 34.4  30.0 - 36.0 g/dL Final   RDW 54/10/8117 14.7  11.5 - 15.5 % Final   Platelets 07/23/2023 236  150 - 400 K/uL Final   nRBC 07/23/2023 0.0  0.0 - 0.2 % Final   Performed at [facility], [address] [place], [place] [address]    Blood Alcohol level:  Lab Results  Component Value Date   ETH 120 (H) 08/05/2023   ETH 277 (H) 07/23/2023    Metabolic Disorder Labs: Lab Results  Component Value Date   HGBA1C 5.2 08/07/2023   MPG 102.54 08/07/2023   No results found for: PROLACTIN Lab Results  Component Value Date   CHOL 278 (H) 08/07/2023   TRIG 125 08/07/2023   HDL 56 08/07/2023   CHOLHDL 5.0 08/07/2023   VLDL 25 08/07/2023   LDLCALC 197 (H) 08/07/2023    Therapeutic Lab Levels: No results found for: LITHIUM Lab Results  Component Value Date   VALPROATE 40 (L) 10/07/2019   VALPROATE 84 09/12/2019   No results found for: CBMZ  Physical Findings   AUDIT    Flowsheet Row Admission (Discharged) from 09/09/2019 in [facility] INPAT[facility]IORAL MEDICINE  Alcohol Use Disorder Identification Test Final Score (AUDIT) 6   GAD-7    Flowsheet Row Integrated Behavioral Health [facility]  Total GAD-7 Score 5   PHQ2-9    Flowsheet Row ED from 08/05/2023 in [facility] Integrated Behavioral Health from 07[facility]19/2019 [facility] [place]  PHQ-2 Total Score 2 4  PHQ-9 Total Score 8 10   Flowsheet Row ED from 08/05/2023 in [facility] Most recent reading at 08/05/2023  4:13 PM ED from 08/05/2023 in [facility] Emergency Department at [facility] Most recent reading at 08/05/2023 12:55 AM ED from 07/23/2023 in [facility] Emergency Department at [facility] Most recent reading at 07/23/2023  1:50 PM  C-SSRS RISK CATEGORY High Risk High Risk No Risk     Musculoskeletal  Strength & Muscle Tone: within normal limits Gait &  Station: normal Patient leans: N/A  Psychiatric Specialty Exam  Presentation  General Appearance:  Casual  Eye Contact: Fair  Speech: Clear and Coherent  Speech Volume: Normal  Handedness:No data recorded  Mood and Affect  Mood: Labile  Affect: Congruent   Thought Process  Thought Processes: Coherent  Descriptions of Associations:Intact  Orientation:Full (Time, Place and Person)  Thought Content:Tangential  Diagnosis of Schizophrenia or Schizoaffective disorder in past: No    Hallucinations:Hallucinations: None  Ideas of Reference:None  Suicidal Thoughts:Suicidal Thoughts: Yes, Passive SI Passive Intent and/or Plan: Without Intent  Homicidal Thoughts:Homicidal Thoughts: No   Sensorium  Memory: Remote Poor  Judgment: Intact  Insight: Lacking   Executive Functions  Concentration: Fair  Attention Span: Fair  Recall: Poor  Fund of Knowledge: Fair  Language: Fair   Psychomotor Activity  Psychomotor Activity: Psychomotor Activity: Normal   Assets  Assets: Communication Skills; Physical Health; Resilience   Sleep  Sleep: Sleep: Fair  Estimated Sleeping Duration (Last 24 Hours): 7.00-8.75 hours  No data recorded  Physical Exam  Physical Exam ROS Physical Exam Constitutional:      Appearance: the patient is not toxic-appearing.  Pulmonary:     Effort: Pulmonary effort is normal.  Neurological:     General: No focal deficit present.     Mental Status: the patient is alert and oriented to person, place, and time.   Review of Systems  Respiratory:  Negative for shortness of breath.   Cardiovascular:  Negative for chest pain.  Gastrointestinal:  Negative for abdominal pain, constipation, diarrhea, nausea and vomiting.  Neurological:  Negative for headaches.   Blood pressure 102/70, pulse 78, temperature 97.7 F (36.5 C), temperature source Oral, resp. rate 18, SpO2 96%. There is no height or weight on file to  calculate BMI.  Treatment Plan Summary: Daily contact with patient to assess and evaluate symptoms and progress in treatment, Medication management, and Plan    John [name] is a 43 yo male with a past psychiatric history of alcohol use disorder, bipolar disorder, MDD who presented to [facility] ED on 6/11 for suicidal ideation, self-inflicted injury and alcohol intoxication. He has a history of 1 suicide attempt via self-harm of his wrist and 1 psychiatric hospitalization. He was admitted for alcohol detoxification and crisis stabilization.       Patient meets criteria for alcohol use disorder and substance-induced mood disorder. Unclear regarding his history of bipolar disorder, he does seem to have some hyperverbal speech, tangentiality, inappropriate comments. Seems to have decreased impulsivity and suspect that this may be patient's baseline regarding his inappropriate comments and speech does not appear to be pressured. Will continue to monitor and assess mood in the following days and whether he would benefit from additional mood stabilization. Will continue naltrexone  and increase gabapentin  for his alcohol use disorder and continue CIWA with sx-triggered ativan . Per chart review, patient has also had a history of suicidal statements made for secondary gain of housing.    Labs notable for: Alc 120, K 3.4, AST 27, ALT 21, Cr 0.79. CBC wnl. No UDS.  A1c wnl, lipid panel LDL 197, TSH wnl, UDS+BZD, EKG pending    Status: VOL   #Alcohol use disorder #Substance-induced mood disorder -CIWA -sx-triggered ativan  -PRN: atarax , tylenol , maalox, imodium , milk of mg, zofran  -daily MV, thiamine   -continue naltrexone  50mg  for alcohol use disorder -increase gabapentin  200mg  TID for anxiety and alcohol use disorder, can increase as tolerated     #Tobacco use disorder -nicotine  patch ordered   #Cannabis use disorder -continue to encourage cessation    Dispo: wants residential / housing   Norbert Bean, MD, PGY-2 08/07/2023 8:41 AM  This case was discussed with attending Dr. Genita Keys who agrees with the above formulated treatment plan. Please see attending attestation for additional details.   This note was created using a voice recognition software as a result there may be grammatical errors inadvertently enclosed that do not reflect the nature of this encounter. Every attempt is made to correct such errors.

## 2023-08-07 NOTE — Group Note (Signed)
 Group Topic: Positive Affirmations  Group Date: 08/07/2023 Start Time: 1730 End Time: 1810 Facilitators: Esther Hem, NT  Department: [facility]  Number of Participants: 7  Group Focus: affirmation and reminiscence Treatment Modality:  Psychoeducation Interventions utilized were reminiscence and support Purpose: increase insight and regain self-worth  Name: John [name] Date of Birth: 07-Oct-1980  MR: [id number]    Level of Participation: active Quality of Participation: cooperative and engaged Interactions with others: gave feedback Mood/Affect: appropriate Triggers (if applicable): n/a Cognition: coherent/clear and rambled Progress: Gaining insight Response: PT stated that he needed to stay out of the streets because he is easily triggered. MHT encouraged PT to be responsible, driven and organized as read his positive affirmation card. Plan: patient will be encouraged to attend group.  Patients Problems:  Patient Active Problem List   Diagnosis Date Noted   MDD (major depressive disorder) 08/05/2023   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 Hx urethroplasty for urethral trauma in 2020 04/30/2019   Alcohol abuse with intoxication (HCC) 04/30/2019   Acute Femoral condyle fracture (HCC) 04/30/2019   Alcohol abuse 01/14/2018   Substance induced mood disorder (HCC) 01/14/2018

## 2023-08-07 NOTE — ED Notes (Signed)
 Assumed care for previous nurse. Pt in dinning area. AOX4, denies pain or discomfort. Denies SI/HI and AVH. Reports feeling anxious, PRN medicine given. Calling places for placement to prepare for discharge. Noticed L wrist cut, healed and scabbing. Reports did this because hotels were very expensice 70$/day and was frustrated but in a better mood today besides anxiety mild. Safety measures continued.

## 2023-08-07 NOTE — ED Notes (Signed)
 Pt is in the dayroom watching TV with peers. Pt denies SI/HI/AVH. Pt has no further complain.No acute distress noted.

## 2023-08-07 NOTE — ED Notes (Signed)
 UDS done and positive for Benzo.

## 2023-08-07 NOTE — [facility] Group Notes (Addendum)
 SPIRITUALITY GROUP NOTE  Spirituality group facilitated by Chaplain Simmie Garin, MDiv, BCC.  Group Description: Group focused on topic of hope. Patients participated in facilitated discussion around topic, connecting with one another around experiences and definitions for hope. Group members engaged with visual explorer photos, reflecting on what hope looks like for them today. Group engaged in discussion around how their definitions of hope are present today in hospital.  Modalities: Psycho-social ed, Adlerian, Narrative, MI  Patient Progress: Fern arrived to group late.  Engaged with group members when prompted by facilitator.

## 2023-08-08 DIAGNOSIS — F1210 Cannabis abuse, uncomplicated: Secondary | ICD-10-CM | POA: Diagnosis not present

## 2023-08-08 DIAGNOSIS — F10229 Alcohol dependence with intoxication, unspecified: Secondary | ICD-10-CM | POA: Diagnosis not present

## 2023-08-08 DIAGNOSIS — F10230 Alcohol dependence with withdrawal, uncomplicated: Secondary | ICD-10-CM | POA: Diagnosis not present

## 2023-08-08 DIAGNOSIS — F1024 Alcohol dependence with alcohol-induced mood disorder: Secondary | ICD-10-CM | POA: Diagnosis not present

## 2023-08-08 MED ORDER — PROPRANOLOL HCL 10 MG PO TABS
10.0000 mg | ORAL_TABLET | Freq: Two times a day (BID) | ORAL | Status: DC
  Administered 2023-08-08 – 2023-08-10 (×5): 10 mg via ORAL
  Filled 2023-08-08 (×5): qty 1

## 2023-08-08 MED ORDER — GABAPENTIN 300 MG PO CAPS
300.0000 mg | ORAL_CAPSULE | Freq: Once | ORAL | Status: AC
  Administered 2023-08-08: 300 mg via ORAL

## 2023-08-08 MED ORDER — GABAPENTIN 300 MG PO CAPS
300.0000 mg | ORAL_CAPSULE | Freq: Three times a day (TID) | ORAL | Status: DC
  Administered 2023-08-08 – 2023-08-10 (×6): 300 mg via ORAL
  Filled 2023-08-08 (×7): qty 1

## 2023-08-08 MED ORDER — HYDROXYZINE HCL 25 MG PO TABS
50.0000 mg | ORAL_TABLET | Freq: Three times a day (TID) | ORAL | Status: DC | PRN
  Administered 2023-08-08 – 2023-08-09 (×3): 50 mg via ORAL
  Filled 2023-08-08 (×4): qty 2

## 2023-08-08 NOTE — ED Notes (Signed)
 Patient is in the dayroom watching TV with other patients. Pleasant and a talker. Denies SI/HI & AVH. NAD Respirations are even and unlabored. Safety checks in place per policy. Will monitor for safety.

## 2023-08-08 NOTE — ED Notes (Signed)
 Patient is sleeping. Respirations equal and unlabored. No change in assessment or acuity. Routine safety checks conducted according to facility protocol.

## 2023-08-08 NOTE — Group Note (Signed)
 Group Topic: Social Support  Group Date: 08/08/2023 Start Time: 1400 End Time: 1500 Facilitators: Arlan Belling, RN  Department: [facility]  Number of Participants: 9  Group Focus: social skills Treatment Modality:  Leisure Development Interventions utilized were clarification, exploration, and group exercise Purpose: regain self-worth and reinforce self-care  Name: John [name] Date of Birth: May 07, 1980  MR: [id number]    Level of Participation: active  Quality of Participation: attentive and cooperative Interactions with others: gave feedback Mood/Affect: anxious and appropriate Triggers (if applicable):   Cognition: coherent/clear Progress: Significant Response:   Plan: follow-up needed  Patients Problems:  Patient Active Problem List   Diagnosis Date Noted   MDD (major depressive disorder) 08/05/2023   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 Hx urethroplasty for urethral trauma in 2020 04/30/2019   Alcohol abuse with intoxication (HCC) 04/30/2019   Acute Femoral condyle fracture (HCC) 04/30/2019   Alcohol abuse 01/14/2018   Substance induced mood disorder (HCC) 01/14/2018

## 2023-08-08 NOTE — Group Note (Signed)
 Group Topic: Relaxation  Group Date: 08/08/2023 Start Time: 2030 End Time: 2045 Facilitators: Marcha Session  Department: [facility]  Number of Participants: 8  Group Focus: art therapy, check in, and clarity of thought Treatment Modality:  Art Therapy and Cognitive Behavioral Therapy Interventions utilized were group exercise and leisure development Purpose: increase insight and foster various positive outcomes, including enhanced communication, increased morale, improved teamwork, and development of problem solving essential skills.  Name: John [name] Date of Birth: May 23, 1980  MR: [id number]    Level of Participation: active Quality of Participation: attentive and cooperative Interactions with others: gave feedback Mood/Affect: appropriate Triggers (if applicable): N/A Cognition: coherent/clear Progress: Gaining insight Response: N/A Plan: patient will be encouraged to keep attending group and interacting with peers during group time.  Patients Problems:  Patient Active Problem List   Diagnosis Date Noted   MDD (major depressive disorder) 08/05/2023   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 Hx urethroplasty for urethral trauma in 2020 04/30/2019   Alcohol abuse with intoxication (HCC) 04/30/2019   Acute Femoral condyle fracture (HCC) 04/30/2019   Alcohol abuse 01/14/2018   Substance induced mood disorder (HCC) 01/14/2018

## 2023-08-08 NOTE — ED Provider Notes (Cosign Needed Addendum)
 Behavioral Health Progress Note  Date and Time: 08/08/2023 7:58 AM Name: John [name] MRN:  [id number]  Chart review: VSS. CIWA 0. Compliant with medications. Took PRN atarax  and trazodone .   Subjective:   Patient is seen in the millieu. He reports continuing to struggle with difficulties with sleep initiation, states the trazodone  100 mg did not help too much.  Reports he kept tossing and turning in bed.  We discussed would likely find alcohol withdrawal.  He verbalized understanding.  He denies current SI/HI/AVH.  Reports eating fairly well.  Reports mood is stable.  Reports occasionally being overwhelmed at all people around him.  Reports he normally has by himself so while he tolerates having people around him, does sometimes make him feel anxious.  He was amenable to trialing propranolol as needed to help with anxiety.  He was also amenable to increasing hydroxyzine  to better aid with as needed anxiety.  Diagnosis:  Final diagnoses:  Alcohol use disorder, severe, dependence (HCC)  Substance induced mood disorder (HCC)  Cannabis use disorder  Tobacco use disorder    Past Psychiatric History:  He reports a past psychiatric history of MDD, bipolar disorder, alcohol use disorder. He reports he had a past psychiatric hospitalization in [place] over 3 years ago and that was when he was diagnosed with bipolar disorder. He reports he was admitted during a similar situation for a suicide attempt by cutting his wrist and alcohol use. He reports he was prescribed zoloft by his PCP [facility]) for depression but he has not been taking it. He denies having a therapist. Per chart review, patient has also previously been prescribed depakote  500 BID, zyprexa  2.5 at bedtime. Patient also had psychiatric hospitalization in 08/2019 at [facility], he was admitted for worsening depression mixed bipolar traits and multiple admits for alcohol intoxication and withdrawals, not able to  contract for safety. At that time he was discharged on depakote  1000mg  BID, gabapentin  600 TID, naltrexone  50, nortriptyline  25 at bedtime, zyprexa  15mg  at bedtime and discharged to [facility]. Per chart review, patient left his detox bed at [facility] 2 months ago after being dissatisfied with his treatment and inability to smoke.     Past Medical History: He denies past medical history. Per chart review, has history of urethral trauma, BPH, multiple orthopedic injuries. He reports he is allergic to PCN   Family History: He reports family history of alcoholism in his dad and paternal grandfather.   Social History: He reports that he has been homeless since February. He reports that he has a 27 yo son who is currently living with his grandfather. 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 He reports that he was in regular classes and got his GED. He reports history of multiple legal issues and the upcoming court date on July 10th. Discussed with patient that this could be a barrier to him getting into a rehab facility. He also declines for collateral to be contacted, reports that he does not have support.   Additional Social History:                         Current Medications:  Current Facility-Administered Medications  Medication Dose Route Frequency Provider Last Rate Last Admin   acetaminophen  (TYLENOL ) tablet 650 mg  650 mg Oral Q6H PRN Monroe Antigua, NP  alum & mag hydroxide-simeth (MAALOX/MYLANTA) 200-200-20 MG/5ML suspension 30 mL  30 mL Oral Q4H PRN [place], Cicely, NP       haloperidol  (HALDOL ) tablet 5 mg  5 mg Oral TID PRN Monroe Antigua, NP       And   diphenhydrAMINE  (BENADRYL ) capsule 50 mg  50 mg Oral TID PRN Monroe Antigua, NP       haloperidol  lactate (HALDOL ) injection 10 mg  10 mg Intramuscular TID PRN Monroe Antigua, NP       And   diphenhydrAMINE  (BENADRYL ) injection 50 mg  50 mg  Intramuscular TID PRN [place], Alaine Howells, NP       And   LORazepam  (ATIVAN ) injection 2 mg  2 mg Intramuscular TID PRN [place] Antigua, NP       haloperidol  lactate (HALDOL ) injection 5 mg  5 mg Intramuscular TID PRN [place] Antigua, NP       And   diphenhydrAMINE  (BENADRYL ) injection 50 mg  50 mg Intramuscular TID PRN Monroe Antigua, NP       And   LORazepam  (ATIVAN ) injection 2 mg  2 mg Intramuscular TID PRN Monroe Antigua, NP       gabapentin  (NEURONTIN ) capsule 200 mg  200 mg Oral TID Chien, Stephanie, MD   200 mg at 08/07/23 2140   hydrOXYzine  (ATARAX ) tablet 25 mg  25 mg Oral TID PRN Chien, Stephanie, MD   25 mg at 08/07/23 1824   LORazepam  (ATIVAN ) tablet 0-4 mg  0-4 mg Oral Q6H PRN Norbert Bean, MD       magnesium  hydroxide (MILK OF MAGNESIA) suspension 30 mL  30 mL Oral Daily PRN [place], Alaine Howells, NP       naltrexone  (DEPADE) tablet 50 mg  50 mg Oral Daily Chien, Stephanie, MD   50 mg at 08/07/23 5284   nicotine  (NICODERM CQ  - dosed in mg/24 hours) patch 21 mg  21 mg Transdermal Daily [place], Cicely, NP   21 mg at 08/07/23 1324   thiamine  (VITAMIN B1) tablet 100 mg  100 mg Oral Daily [place], Cicely, NP   100 mg at 08/07/23 0908   traZODone  (DESYREL ) tablet 100 mg  100 mg Oral QHS PRN Chien, Stephanie, MD   100 mg at 08/07/23 2140   Current Outpatient Medications  Medication Sig Dispense Refill   gabapentin  (NEURONTIN ) 600 MG tablet Take 600 mg by mouth 2 (two) times daily. TAKE 1 TABLET BY MOUTH TWICE A DAY FOR 5 DAYS DURING DETOX     hydrOXYzine  (VISTARIL ) 50 MG capsule Take 50 mg by mouth every 6 (six) hours as needed for anxiety. TAKE 1 CAPSULE BY MOUTH EVERY 6 HOURS AS NEEDED FOR ANXIETY/INSOMNIA     traZODone  (DESYREL ) 50 MG tablet Take 50 mg by mouth at bedtime. TAKE 1 TABLETS BY MOUTH EVERY NIGHT AT BEDTIMES AS NEEDED FOR SLEEP      Labs  Lab Results:  Admission on 08/05/2023  Component Date Value Ref Range Status   Hgb A1c MFr Bld 08/07/2023 5.2  4.8 - 5.6 % Final   Comment:  (NOTE) Diagnosis of Diabetes The following HbA1c ranges recommended by the American Diabetes Association (ADA) may be used as an aid in the diagnosis of diabetes mellitus.  Hemoglobin             Suggested A1C NGSP%              Diagnosis  <5.7  Non Diabetic  5.7-6.4                Pre-Diabetic  >6.4                   Diabetic  <7.0                   Glycemic control for                       adults with diabetes.     Mean Plasma Glucose 08/07/2023 102.54  mg/dL Final   Performed at [facility], [address], [place], [place] [address]   Cholesterol 08/07/2023 278 (H)  0 - 200 mg/dL Final   Triglycerides 60/45/4098 125  <150 mg/dL Final   HDL 11/91/4782 56  >40 mg/dL Final   Total CHOL/HDL Ratio 08/07/2023 5.0  RATIO Final   VLDL 08/07/2023 25  0 - 40 mg/dL Final   LDL Cholesterol 08/07/2023 197 (H)  0 - 99 mg/dL Final   Comment:        Total Cholesterol/HDL:CHD Risk Coronary Heart Disease Risk Table                     Men   Women  1/2 Average Risk   3.4   3.3  Average Risk       5.0   4.4  2 X Average Risk   9.6   7.1  3 X Average Risk  23.4   11.0        Use the calculated Patient Ratio above and the CHD Risk Table to determine the patient's CHD Risk.        ATP III CLASSIFICATION (LDL):  <100     mg/dL   Optimal  956-213  mg/dL   Near or Above                    Optimal  130-159  mg/dL   Borderline  086-578  mg/dL   High  >469     mg/dL   Very High Performed at [facility], [address] [place], [place] [address]    TSH 08/07/2023 3.271  0.350 - 4.500 uIU/mL Final   Comment: Performed by a 3rd Generation assay with a functional sensitivity of <=0.01 uIU/mL. Performed at [facility], [address] [place], [place] [address]   Admission on 08/05/2023, Discharged on 08/05/2023  Component Date Value Ref Range Status   WBC 08/05/2023 8.8  4.0 - 10.5 K/uL Final   RBC 08/05/2023 5.18  4.22 - 5.81 MIL/uL Final    Hemoglobin 08/05/2023 15.9  13.0 - 17.0 g/dL Final   HCT 44/02/270 47.6  39.0 - 52.0 % Final   MCV 08/05/2023 91.9  80.0 - 100.0 fL Final   MCH 08/05/2023 30.7  26.0 - 34.0 pg Final   MCHC 08/05/2023 33.4  30.0 - 36.0 g/dL Final   RDW 53/66/4403 14.7  11.5 - 15.5 % Final   Platelets 08/05/2023 278  150 - 400 K/uL Final   nRBC 08/05/2023 0.0  0.0 - 0.2 % Final   Performed at [facility], [address] [place], [place] [address]   Sodium 08/05/2023 137  135 - 145 mmol/L Final   Potassium 08/05/2023 3.4 (L)  3.5 - 5.1 mmol/L Final   Chloride 08/05/2023 104  98 - 111 mmol/L Final   CO2 08/05/2023 22  22 -  32 mmol/L Final   Glucose, Bld 08/05/2023 103 (H)  70 - 99 mg/dL Final   Glucose reference range applies only to samples taken after fasting for at least 8 hours.   BUN 08/05/2023 12  6 - 20 mg/dL Final   Creatinine, Ser 08/05/2023 0.79  0.61 - 1.24 mg/dL Final   Calcium  08/05/2023 9.1  8.9 - 10.3 mg/dL Final   Total Protein 16/11/9602 7.8  6.5 - 8.1 g/dL Final   Albumin 54/10/8117 4.1  3.5 - 5.0 g/dL Final   AST 14/78/2956 27  15 - 41 U/L Final   ALT 08/05/2023 21  0 - 44 U/L Final   Alkaline Phosphatase 08/05/2023 72  38 - 126 U/L Final   Total Bilirubin 08/05/2023 1.0  0.0 - 1.2 mg/dL Final   GFR, Estimated 08/05/2023 >60  >60 mL/min Final   Comment: (NOTE) Calculated using the CKD-EPI Creatinine Equation (2021)    Anion gap 08/05/2023 11  5 - 15 Final   Performed at [facility], [address] [place], [place] [address]   Alcohol, Ethyl (B) 08/05/2023 120 (H)  <15 mg/dL Final   Comment: (NOTE) For medical purposes only. Performed at [facility], [address] [place], [place] [address]   Admission on 07/23/2023, Discharged on 07/23/2023  Component Date Value Ref Range Status   Sodium 07/23/2023 142  135 - 145 mmol/L Final   Potassium 07/23/2023 3.6  3.5 - 5.1 mmol/L Final   Chloride 07/23/2023 105  98 - 111 mmol/L Final   CO2 07/23/2023  30  22 - 32 mmol/L Final   Glucose, Bld 07/23/2023 107 (H)  70 - 99 mg/dL Final   Glucose reference range applies only to samples taken after fasting for at least 8 hours.   BUN 07/23/2023 <5 (L)  6 - 20 mg/dL Final   Creatinine, Ser 07/23/2023 0.67  0.61 - 1.24 mg/dL Final   Calcium  07/23/2023 9.2  8.9 - 10.3 mg/dL Final   Total Protein 69/62/9528 7.3  6.5 - 8.1 g/dL Final   Albumin 41/32/4401 3.8  3.5 - 5.0 g/dL Final   AST 02/72/5366 45 (H)  15 - 41 U/L Final   ALT 07/23/2023 37  0 - 44 U/L Final   Alkaline Phosphatase 07/23/2023 77  38 - 126 U/L Final   Total Bilirubin 07/23/2023 0.8  0.0 - 1.2 mg/dL Final   GFR, Estimated 07/23/2023 >60  >60 mL/min Final   Comment: (NOTE) Calculated using the CKD-EPI Creatinine Equation (2021)    Anion gap 07/23/2023 7  5 - 15 Final   Performed at [facility], [address] [place], [place] [address]   Alcohol, Ethyl (B) 07/23/2023 277 (H)  <15 mg/dL Final   Comment: (NOTE) For medical purposes only. Performed at [facility], [address] [place], [place] [address]    WBC 07/23/2023 5.6  4.0 - 10.5 K/uL Final   RBC 07/23/2023 5.51  4.22 - 5.81 MIL/uL Final   Hemoglobin 07/23/2023 17.0  13.0 - 17.0 g/dL Final   HCT 56/38/7564 49.4  39.0 - 52.0 % Final   MCV 07/23/2023 89.7  80.0 - 100.0 fL Final   MCH 07/23/2023 30.9  26.0 - 34.0 pg Final   MCHC 07/23/2023 34.4  30.0 - 36.0 g/dL Final   RDW 33/29/5188 14.7  11.5 - 15.5 % Final   Platelets 07/23/2023 236  150 - 400 K/uL Final   nRBC 07/23/2023 0.0  0.0 - 0.2 % Final  Performed at [facility], [address] [place], [place] [address]    Blood Alcohol level:  Lab Results  Component Value Date   ETH 120 (H) 08/05/2023   ETH 277 (H) 07/23/2023    Metabolic Disorder Labs: Lab Results  Component Value Date   HGBA1C 5.2 08/07/2023   MPG 102.54 08/07/2023   No results found for: PROLACTIN Lab Results  Component Value Date   CHOL 278 (H)  08/07/2023   TRIG 125 08/07/2023   HDL 56 08/07/2023   CHOLHDL 5.0 08/07/2023   VLDL 25 08/07/2023   LDLCALC 197 (H) 08/07/2023    Therapeutic Lab Levels: No results found for: LITHIUM Lab Results  Component Value Date   VALPROATE 40 (L) 10/07/2019   VALPROATE 84 09/12/2019   No results found for: CBMZ  Physical Findings   AUDIT    Flowsheet Row Admission (Discharged) from 09/09/2019 in [facility] IN[facility]AL [facility]ICINE  Alcohol Use Disorder Identification Test Final Score (AUDIT) 6   GAD-7    Flowsheet Row Integrated Behavioral Health [facility]  Total GAD-7 Score 5   PHQ2-9    Flowsheet Row ED from 08/05/2023 in [facility] Integrated Behavioral Health from 07/19/2019 in [facility]  PHQ-2 Total Score 2 4  PHQ-9 Total Score 8 10   Flowsheet Row ED from 08/05/2023 in [facility] Most recent reading at 08/05/2023  4:13 PM ED from 08/05/2023 in [facility] Emergency Department at [facility] Most recent reading at 08/05/2023 12:55 AM ED from 07/23/2023 in [facility] Department at [facility] Most recent reading at 07/23/2023  1:50 PM  C-SSRS RISK CATEGORY High Risk High Risk No Risk     Musculoskeletal  Strength & Muscle Tone: within normal limits Gait & Station: normal Patient leans: N/A  Psychiatric Specialty Exam  Presentation  General Appearance:  Casual  Eye Contact: Fair  Speech: Clear and Coherent  Speech Volume: Normal   Mood and Affect  Mood: Labile  Affect: Congruent   Thought Process  Thought Processes: Coherent  Descriptions of Associations:Intact  Orientation:Full (Time, Place and Person)  Thought Content:Tangential  Diagnosis of Schizophrenia or Schizoaffective disorder in past: No    Hallucinations:denies  Ideas of Reference:None  Suicidal Thoughts:denies  Homicidal  Thoughts:denies   Sensorium  Memory: Remote Poor  Judgment: Intact  Insight: Lacking   Executive Functions  Concentration: Fair  Attention Span: Fair  Recall: Poor  Fund of Knowledge: Fair  Language: Fair   Psychomotor Activity  Psychomotor Activity: normal   Assets  Assets: Manufacturing systems engineer; Physical Health; Resilience   Sleep  Sleep: Poor  Estimated Sleeping Duration (Last 24 Hours): 7.00-9.00 hours    Physical Exam  Physical Exam ROS Physical Exam Constitutional:      Appearance: the patient is not toxic-appearing.  Pulmonary:     Effort: Pulmonary effort is normal.  Neurological:     General: No focal deficit present.     Mental Status: the patient is alert and oriented to person, place, and time.   Review of Systems  Respiratory:  Negative for shortness of breath.   Cardiovascular:  Negative for chest pain.  Gastrointestinal:  Negative for abdominal pain, constipation, diarrhea, nausea and vomiting.  Neurological:  Negative for headaches.   Blood pressure 100/66, pulse 71, temperature 97.8 F (36.6 C), temperature source Oral, resp. rate 16, SpO2 95%. There is no height or weight on file  to calculate BMI.  Treatment Plan Summary: Daily contact with patient to assess and evaluate symptoms and progress in treatment, Medication management, and Plan    John [name] is a 43 yo male with a past psychiatric history of alcohol use disorder, bipolar disorder, MDD who presented to [facility] ED on 6/11 for suicidal ideation, self-inflicted injury and alcohol intoxication. He has a history of 1 suicide attempt via self-harm of his wrist and 1 psychiatric hospitalization. He was admitted for alcohol detoxification and crisis stabilization.       Patient meets criteria for alcohol use disorder and substance-induced mood disorder. Unclear regarding his history of bipolar disorder, he does seem to have some hyperverbal speech, tangentiality,  inappropriate comments. May be a sign of hyperactive ADHD or psychiatric baseline. Will continue to monitor and assess mood in the following days and whether he would benefit from additional mood stabilization. Will continue naltrexone  and increase gabapentin  for his alcohol use disorder and continue CIWA with sx-triggered ativan . Per chart review, patient has also had a history of suicidal statements made for secondary gain of housing.    Labs notable for: Alc 120, K 3.4, AST 27, ALT 21, Cr 0.79. CBC wnl. No UDS.  A1c wnl, lipid panel LDL 197, TSH wnl, UDS+BZD, EKG pending    Status: VOL   #Alcohol use disorder #Substance-induced mood disorder -CIWA -sx-triggered ativan  -PRN: atarax , tylenol , maalox, imodium , milk of mg, zofran  -daily MV, thiamine   -continue naltrexone  50mg  for alcohol use disorder -increase gabapentin  to 300 mg TID for anxiety and alcohol use disorder, can increase as tolerated   -Start propranolol 10 mg bid for social anxiety   #Tobacco use disorder -nicotine  patch ordered   #Cannabis use disorder -continue to encourage cessation    Dispo: wants residential / housing   Augusta Blizzard, MD, PGY-2 08/08/2023 7:59 AM  This case was discussed with attending Dr. Genita Keys who agrees with the above formulated treatment plan. Please see attending attestation for additional details.   This note was created using a voice recognition software as a result there may be grammatical errors inadvertently enclosed that do not reflect the nature of this encounter. Every attempt is made to correct such errors.

## 2023-08-08 NOTE — ED Notes (Signed)
 Patient is awake and alert on unit.  He just woke up and is getting ready to eat breakfast.  No complaints or distress.  No Withdrawal.  Patient can be needy at times requiring limits otherwise he is pleasant.  Insight into illness is limited with resistance to education.   Will monitor and provide safe environment.

## 2023-08-08 NOTE — ED Notes (Signed)
 Patient is in the bedroom calm and sleeping with eyes closed. NAD. Will continue to monitor for safety.

## 2023-08-08 NOTE — Group Note (Signed)
 Group Topic: Overcoming Obstacles  Group Date: 08/08/2023 Start Time: 1630 End Time: 1700 Facilitators: Esther Hem, NT  Department: [facility]  Number of Participants: 9  Group Focus: relapse prevention and self-esteem Treatment Modality:  Psychoeducation Interventions utilized were support Purpose: increase insight and relapse prevention strategies  Name: John [name] Date of Birth: 1980/07/06  MR: [id number]    Level of Participation: active Quality of Participation: attentive, cooperative, and engaged Interactions with others: gave feedback Mood/Affect: appropriate Triggers (if applicable): n/a Cognition: coherent/clear Progress: Gaining insight Response: PT rambled and got off topic quite a bit, but stated that he wanted to get into a program that would really help him stay sober and change his life. Plan: patient will be encouraged to attend group.  Patients Problems:  Patient Active Problem List   Diagnosis Date Noted   MDD (major depressive disorder) 08/05/2023   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 Hx urethroplasty for urethral trauma in 2020 04/30/2019   Alcohol abuse with intoxication (HCC) 04/30/2019   Acute Femoral condyle fracture (HCC) 04/30/2019   Alcohol abuse 01/14/2018   Substance induced mood disorder (HCC) 01/14/2018

## 2023-08-09 DIAGNOSIS — F1210 Cannabis abuse, uncomplicated: Secondary | ICD-10-CM | POA: Diagnosis not present

## 2023-08-09 DIAGNOSIS — F10229 Alcohol dependence with intoxication, unspecified: Secondary | ICD-10-CM | POA: Diagnosis not present

## 2023-08-09 DIAGNOSIS — F1024 Alcohol dependence with alcohol-induced mood disorder: Secondary | ICD-10-CM | POA: Diagnosis not present

## 2023-08-09 DIAGNOSIS — F10230 Alcohol dependence with withdrawal, uncomplicated: Secondary | ICD-10-CM | POA: Diagnosis not present

## 2023-08-09 MED ORDER — HYDROXYZINE HCL 50 MG PO TABS: 50.0000 mg | ORAL_TABLET | Freq: Three times a day (TID) | ORAL | Status: DC | PRN

## 2023-08-09 MED ORDER — NALTREXONE HCL 50 MG PO TABS: 50.0000 mg | ORAL_TABLET | Freq: Every day | ORAL | 0 refills | Status: DC

## 2023-08-09 MED ORDER — NICOTINE 21 MG/24HR TD PT24: 21.0000 mg | MEDICATED_PATCH | Freq: Every day | TRANSDERMAL | 0 refills | Status: DC

## 2023-08-09 MED ORDER — TRAZODONE HCL 100 MG PO TABS: 50.0000 mg | ORAL_TABLET | Freq: Every evening | ORAL | 0 refills | Status: DC | PRN

## 2023-08-09 MED ORDER — GABAPENTIN 300 MG PO CAPS: 300.0000 mg | ORAL_CAPSULE | Freq: Three times a day (TID) | ORAL | 0 refills | Status: DC

## 2023-08-09 MED ORDER — PROPRANOLOL HCL 10 MG PO TABS: 10.0000 mg | ORAL_TABLET | Freq: Two times a day (BID) | ORAL | 0 refills | Status: DC | PRN

## 2023-08-09 NOTE — ED Notes (Signed)
 Patient resting with eyes closed in no apparent acute distress. Respirations even and unlabored. Environment secured. Safety checks in place according to facility policy.

## 2023-08-09 NOTE — Group Note (Signed)
 Group Topic: Emotional Regulation  Group Date: 08/09/2023 Start Time: 1610 End Time: 2000 Facilitators: Alvino Joseph, NT  Department: [facility] [address]  Number of Participants: 9  Group Focus: goals/reality orientation Treatment Modality:  Individual Therapy Interventions utilized were assignment Purpose: explore maladaptive thinking  Name: John [name] Date of Birth: [birth year]  MR: [id number]    Level of Participation: active Quality of Participation: cooperative Interactions with others: gave feedback Mood/Affect: appropriate Triggers (if applicable): n/a Cognition: coherent/clear Progress: Moderate Response: daily goal - wake up be positive and get ready to hit the streets again, and stay sober and ask my higher power to guide me Plan: follow-up needed  Patients Problems:  Patient Active Problem List   Diagnosis Date Noted   MDD (major depressive disorder) 08/05/2023   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 Hx urethroplasty for urethral trauma in 2020 04/30/2019   Alcohol abuse with intoxication (HCC) 04/30/2019   Acute Femoral condyle fracture (HCC) 04/30/2019   Alcohol abuse 01/14/2018   Substance induced mood disorder (HCC) 01/14/2018
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
## 2023-08-09 NOTE — ED Notes (Signed)
 Patient calm and sleeping comfortably. Will continue to monitor for safety.

## 2023-08-09 NOTE — ED Notes (Addendum)
 Patient is in Dayroom watching TV with other patients. NAD. Denies needing anything at the moment. Denies SI/HI/AVH. Respirations even and unlabored. Environment secured per policy. Will monitor for safety.

## 2023-08-09 NOTE — ED Notes (Signed)
 Patient is in the bedroom calm and sleeping with eyes closed. NAD. Environment secured. Will continue to monitor for safety.

## 2023-08-09 NOTE — ED Notes (Signed)
 Patient repeatedly approaching nurses' station, makes inappropriate remarks to writer in regards to appearance and relationship status. Writer able to redirect patient to other topics.
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
## 2023-08-10 DIAGNOSIS — F1210 Cannabis abuse, uncomplicated: Secondary | ICD-10-CM | POA: Diagnosis not present

## 2023-08-10 DIAGNOSIS — F10230 Alcohol dependence with withdrawal, uncomplicated: Secondary | ICD-10-CM | POA: Diagnosis not present

## 2023-08-10 DIAGNOSIS — F1024 Alcohol dependence with alcohol-induced mood disorder: Secondary | ICD-10-CM | POA: Diagnosis not present

## 2023-08-10 DIAGNOSIS — F10229 Alcohol dependence with intoxication, unspecified: Secondary | ICD-10-CM | POA: Diagnosis not present

## 2023-08-10 NOTE — ED Notes (Signed)

## 2023-08-10 NOTE — Discharge Instructions (Addendum)
 Based on the information that you have provided and the presenting issues outpatient services and resources for have been recommended.  It is imperative that you follow through with treatment recommendations within 5-7 days from the of discharge to mitigate further risk to your safety and mental well-being. A list of referrals has been provided below to get you started.  You are not limited to the list provided.  In case of an urgent crisis, you may contact the Mobile Crisis Unit with [facility] at [phone].  You have a follow up appointment scheduled as a new patient with Dr. Verdia Glad on Wednesday 6/18 @ 2:15. Please bring ID and insurance information.    [email or web address] [facility] [address], [place], [place] [address]  18 mi [phone] Open  Closes 5 PM   Follow up resources for AA groups are provided below    About A.A. People who think they have a drinking problem are welcome to attend any A.A. meeting. They can sit and listen and learn more about recovery. Or they can share about themselves. It's completely up to them. Everyone is welcome. You don't have to pay anything to attend.   Our members are not professionals on alcoholism or addiction of any kind. We do not provide medical or psychological diagnoses or prognoses. We leave that up to you. A.A. is a supplemental resource. We offer ongoing support for anyone who has a drinking problem based on a solution that works for us .   Meetings are classified as "open" or "closed". Professionals are welcome to observe "open" A.A. meetings. "Open" meetings are for anyone interested in learning more about what happens in A.A. "Closed" meetings are for only those who have a desire to stop drinking. Find an open meeting here.     A.A. Resources   Resources from A.A. in [place]  Contact an A.A. Coordinator for more information: [email or web address] or  [email or web address]. We look forward to hearing from you. [place] A.A. Meetings AA support, including regional helplines, is at the local level. Find local AA information: Lodging[employer].fi       AA Meeting Early [facility]  Location:: Address [address] [place], [place], [address]   Weekly Meeting Schedule MONDAY, 8:00 AM - 9:00 AM Early Adele Admire Discussion Open Virtual Join Early [facility] Online AA Meeting Password: [phone]   TUESDAY, 8:00 AM - 9:00 AM Early Adele Admire Discussion Open Virtual Join Early [facility] Online AA Meeting Password: [phone]   [facility], 8:00 AM - 9:00 AM Early Adele Admire Discussion Open Virtual Join Early [facility] Online AA Meeting Password: [phone]   THURSDAY, 8:00 AM - 9:00 AM Early Adele Admire Discussion Open Virtual Join Early [facility] Online AA Meeting Password: [phone]   FRIDAY, 8:00 AM - 9:00 AM Early Adele Admire Discussion Open Virtual Join Early [facility] Online AA Meeting Password: [phone]   SATURDAY, 8:00 AM - 9:00 AM Early Adele Admire Discussion Open Virtual Join Early [facility] Online AA Meeting Password: [phone]   SUNDAY, 8:00 AM - 9:00 AM Early Adele Admire Discussion Open Virtual Join Early [facility] Online AA Meeting Password: [phone]    Follow up with [facility] Residents only  Walk-in hours for open access (medication management and therapy) are Monday-Friday 8 am to 37M. Appointments are limited, so please arrive at 7:00am. Upon arrival, please complete the form on the clipboard located at the front desk. If there are no clipboards available, all appointments have been filled for that  day.   [facility] Out[facility] [address] Floor Center [place], [place]  [address] [phone]  List of [facility] vacancies were provided to patient who stated he has a house secured for next week.

## 2023-08-10 NOTE — ED Notes (Signed)
 Patient A&Ox4. Denies intent to harm self/others when asked. Denies A/VH. Patient denies any physical complaints when asked. No acute distress noted. Support and encouragement provided. Routine safety checks conducted according to facility protocol. Encouraged patient to notify staff if thoughts of harm toward self or others arise. Patient verbalize understanding and agreement. Will continue to monitor for safety.

## 2023-08-10 NOTE — Discharge Planning (Signed)
 SW spoke with patient who stated that he found an [facility] house he was accepted into but would not be until next week. He stated that he is aware that he has to be detoxed before going and does not intend to use substances. He is wanting to DC to his friends home in [place] until he can get into the [facility] next week. SW made MD and RN aware and provided patient with outpatient resources with walk in clinic and AA/NA local meetings. SW also provided him with a most recent list of [facility] house vacancies as of today. SW provided RN with a cab voucher and he will get a 30 day scripts for his medications. Patient has a follow up appointment with a new PCP arranged for June 18 @ 2:15 at [facility] practice with Dr. Verdia Glad. Instructions are provided in patients AVS.

## 2023-08-10 NOTE — ED Notes (Signed)
 Patient awake, getting clothes together as he gets d/c later in the day. RN suggested to him to go back to sleep and continue later.

## 2023-08-10 NOTE — ED Provider Notes (Signed)
 FBC/OBS ASAP Discharge Summary  Date and Time: 08/10/2023 2:51 PM  Name: John [name]  MRN:  [id number]   Discharge Diagnoses:  Final diagnoses:  Alcohol use disorder, severe, dependence (HCC)  Substance induced mood disorder (HCC)  Cannabis use disorder  Tobacco use disorder   HPI: Per documentation from 08/05/2023: John [name] is a 43 y.o. male brought to the ED via EMS with a chief complaint of suicidal ideation.  Patient left without being seen yesterday evening from the ED.  Returns after drinking alcohol and making superficial cuts to his left wrist.  History of alcohol induced mood disorder.  Denies active SI/HI/AH/VH.  Voices no medical complaints.   Stay Summary: Pt was subsequently admitted to the Facility based crises center at the [facility] for treatment and stabilization of his mental status. Medications started were as follows: #Alcohol use disorder #Substance-induced mood disorder -CIWA -sx-triggered ativan  -PRN: atarax , tylenol , maalox, imodium , milk of mg, zofran  -daily MV, thiamine   -start naltrexone  50mg  for alcohol use disorder -start gabapentin  100mg  TID for anxiety and alcohol use disorder, can increase as tolerated     #Tobacco use disorder -nicotine  patch ordered   #Cannabis use disorder -continue to encourage cessation    Disposition discussed at time of admission was residential substance abuse treatment. However, patient has not been willing to wait for the referrals to be sent to residential substance abuse treatment facilities and preferred [facility], but was not willing to stay on the [facility] until next week when he can most likely get into one, and preferred  to go reside with a friend. He spoke with CSW prior to discharge, and will be going to reside, with a friend until next week when he can go to an [place] house.  At time of discharge, the pt reports that their mood is euthymic, improved since admission, and stable.  Denies feeling down, depressed, or sad.  Reports that anxiety symptoms are at manageable level.  Sleep is stable. Appetite is stable.  Concentration is without complaint.  Energy level is adequate. Denies having any suicidal thoughts. Denies having any suicidal intent and plan.  Denies having any HI.  Denies having psychotic symptoms.   Denies having side effects to current psychiatric medications.   Discussed discharge planning: Labs were reviewed with the patient, and abnormal results were discussed with the patient.  The patient is able to verbalize their individual safety plan to this provider.  # It is recommended to the patient to continue psychiatric medications as prescribed, after discharge from the hospital.    # It is recommended to the patient to follow up with your outpatient psychiatric provider and PCP.  # It was discussed with the patient, the impact of alcohol, drugs, tobacco have been there overall psychiatric and medical wellbeing, and total abstinence from substance use was recommended the patient.ed.  # Prescriptions provided or sent directly to preferred pharmacy at discharge. Patient agreeable to plan. Given opportunity to ask questions. Appears to feel comfortable with discharge.    # In the event of worsening symptoms, the patient is instructed to call the crisis hotline, 911 and or go to the nearest ED for appropriate evaluation and treatment of symptoms. To follow-up with primary care provider for other medical issues, concerns and or health care needs  # Patient was discharged with a plan to follow up as noted below.     Total Time spent with patient: 1.5 hours  Past Psychiatric History: please  see H & P Past Medical History: please see H & P Family History: please see H & P Family Psychiatric History: please see H & P Social History: please see H & P Tobacco Cessation:  A prescription for an FDA-approved tobacco cessation medication provided at  discharge  Current Medications:  Current Facility-Administered Medications  Medication Dose Route Frequency Provider Last Rate Last Admin   acetaminophen  (TYLENOL ) tablet 650 mg  650 mg Oral Q6H PRN Penn, Cicely, NP       alum & mag hydroxide-simeth (MAALOX/MYLANTA) 200-200-20 MG/5ML suspension 30 mL  30 mL Oral Q4H PRN Penn, Cicely, NP       haloperidol  (HALDOL ) tablet 5 mg  5 mg Oral TID PRN Monroe Antigua, NP       And   diphenhydrAMINE  (BENADRYL ) capsule 50 mg  50 mg Oral TID PRN Monroe Antigua, NP       haloperidol  lactate (HALDOL ) injection 10 mg  10 mg Intramuscular TID PRN Penn, Alaine Howells, NP       And   diphenhydrAMINE  (BENADRYL ) injection 50 mg  50 mg Intramuscular TID PRN Penn, Alaine Howells, NP       And   LORazepam  (ATIVAN ) injection 2 mg  2 mg Intramuscular TID PRN Penn, Alaine Howells, NP       haloperidol  lactate (HALDOL ) injection 5 mg  5 mg Intramuscular TID PRN Penn, Alaine Howells, NP       And   diphenhydrAMINE  (BENADRYL ) injection 50 mg  50 mg Intramuscular TID PRN Penn, Alaine Howells, NP       And   LORazepam  (ATIVAN ) injection 2 mg  2 mg Intramuscular TID PRN Penn, Alaine Howells, NP       gabapentin  (NEURONTIN ) capsule 300 mg  300 mg Oral TID Augusta Blizzard, MD   300 mg at 08/10/23 0955   hydrOXYzine  (ATARAX ) tablet 50 mg  50 mg Oral TID PRN Augusta Blizzard, MD   50 mg at 08/09/23 2002   magnesium  hydroxide (MILK OF MAGNESIA) suspension 30 mL  30 mL Oral Daily PRN Penn, Alaine Howells, NP       naltrexone  (DEPADE) tablet 50 mg  50 mg Oral Daily Chien, Stephanie, MD   50 mg at 08/10/23 0955   nicotine  (NICODERM CQ  - dosed in mg/24 hours) patch 21 mg  21 mg Transdermal Daily Penn, Cicely, NP   21 mg at 08/10/23 0956   propranolol (INDERAL) tablet 10 mg  10 mg Oral BID Augusta Blizzard, MD   10 mg at 08/10/23 1610   thiamine  (VITAMIN B1) tablet 100 mg  100 mg Oral Daily Penn, Alaine Howells, NP   100 mg at 08/10/23 9604   traZODone  (DESYREL ) tablet 100 mg  100 mg Oral QHS PRN Chien, Stephanie, MD   100 mg at 08/09/23 2116   Current  Outpatient Medications  Medication Sig Dispense Refill   gabapentin  (NEURONTIN ) 300 MG capsule Take 1 capsule (300 mg total) by mouth 3 (three) times daily. 90 capsule 0   hydrOXYzine  (ATARAX ) 50 MG tablet Take 1 tablet (50 mg total) by mouth 3 (three) times daily as needed for anxiety. 60 tablet 00   naltrexone  (DEPADE) 50 MG tablet Take 1 tablet (50 mg total) by mouth daily. 30 tablet 0   nicotine  (NICODERM CQ  - DOSED IN MG/24 HOURS) 21 mg/24hr patch Place 1 patch (21 mg total) onto the skin daily. 28 patch 0   propranolol (INDERAL) 10 MG tablet Take 1 tablet (10 mg total) by mouth 2 (two) times daily as needed. 60  tablet 0   traZODone  (DESYREL ) 100 MG tablet Take 0.5-1 tablets (50-100 mg total) by mouth at bedtime as needed for sleep. 30 tablet 0    PTA Medications:  PTA Medications  Medication Sig   propranolol (INDERAL) 10 MG tablet Take 1 tablet (10 mg total) by mouth 2 (two) times daily as needed.   hydrOXYzine  (ATARAX ) 50 MG tablet Take 1 tablet (50 mg total) by mouth 3 (three) times daily as needed for anxiety.   nicotine  (NICODERM CQ  - DOSED IN MG/24 HOURS) 21 mg/24hr patch Place 1 patch (21 mg total) onto the skin daily.   traZODone  (DESYREL ) 100 MG tablet Take 0.5-1 tablets (50-100 mg total) by mouth at bedtime as needed for sleep.   naltrexone  (DEPADE) 50 MG tablet Take 1 tablet (50 mg total) by mouth daily.   gabapentin  (NEURONTIN ) 300 MG capsule Take 1 capsule (300 mg total) by mouth 3 (three) times daily.   Facility Ordered Medications  Medication   nicotine  (NICODERM CQ  - dosed in mg/24 hours) patch 21 mg   thiamine  (VITAMIN B1) tablet 100 mg   acetaminophen  (TYLENOL ) tablet 650 mg   alum & mag hydroxide-simeth (MAALOX/MYLANTA) 200-200-20 MG/5ML suspension 30 mL   magnesium  hydroxide (MILK OF MAGNESIA) suspension 30 mL   haloperidol  lactate (HALDOL ) injection 10 mg   And   diphenhydrAMINE  (BENADRYL ) injection 50 mg   And   LORazepam  (ATIVAN ) injection 2 mg    haloperidol  lactate (HALDOL ) injection 5 mg   And   diphenhydrAMINE  (BENADRYL ) injection 50 mg   And   LORazepam  (ATIVAN ) injection 2 mg   haloperidol  (HALDOL ) tablet 5 mg   And   diphenhydrAMINE  (BENADRYL ) capsule 50 mg   [COMPLETED] hydrOXYzine  (ATARAX ) tablet 25 mg   naltrexone  (DEPADE) tablet 50 mg   [COMPLETED] potassium chloride  (KLOR-CON ) packet 40 mEq   [EXPIRED] LORazepam  (ATIVAN ) tablet 0-4 mg   traZODone  (DESYREL ) tablet 100 mg   propranolol (INDERAL) tablet 10 mg   hydrOXYzine  (ATARAX ) tablet 50 mg   gabapentin  (NEURONTIN ) capsule 300 mg   [COMPLETED] gabapentin  (NEURONTIN ) capsule 300 mg       08/09/2023   10:05 AM 08/06/2023   11:20 AM 07/19/2019    3:18 PM  Depression screen PHQ 2/9  Decreased Interest 1 1 1   Down, Depressed, Hopeless 1 1 3   PHQ - 2 Score 2 2 4   Altered sleeping 1 1 3   Tired, decreased energy 1 1 0  Change in appetite 1 1 1   Feeling bad or failure about yourself  1 1 1   Trouble concentrating 1 1 1   Moving slowly or fidgety/restless 0 0 0  Suicidal thoughts 0 1 0  PHQ-9 Score 7 8 10   Difficult doing work/chores   Somewhat difficult    Flowsheet Row ED from 08/05/2023 in [facility] Most recent reading at 08/05/2023  4:13 PM ED from 08/05/2023 in [facility] Emergency Department at [facility] Most recent reading at 08/05/2023 12:55 AM ED from 07/23/2023 in [facility] Emergency Department at [facility] Most recent reading at 07/23/2023  1:50 PM  C-SSRS RISK CATEGORY High Risk High Risk No Risk    Musculoskeletal  Strength & Muscle Tone: within normal limits Gait & Station: normal Patient leans: N/A  Psychiatric Specialty Exam  Presentation  General Appearance:  Casual  Eye Contact: Fair  Speech: Clear and Coherent  Speech Volume: Normal  Handedness: Right   Mood and Affect  Mood: Euthymic  Affect: Congruent  Thought Process  Thought Processes: Coherent  Descriptions of  Associations:Intact  Orientation:Full (Time, Place and Person)  Thought Content:Logical  Diagnosis of Schizophrenia or Schizoaffective disorder in past: No    Hallucinations:Hallucinations: None  Ideas of Reference:None  Suicidal Thoughts:Suicidal Thoughts: No  Homicidal Thoughts:Homicidal Thoughts: No   Sensorium  Memory: Immediate Fair  Judgment: Good  Insight: Good   Executive Functions  Concentration: Good  Attention Span: Good  Recall: Good  Fund of Knowledge: Good  Language: Good   Psychomotor Activity  Psychomotor Activity: Psychomotor Activity: Normal   Assets  Assets: Communication Skills; Desire for Improvement; Physical Health   Sleep  Sleep: Sleep: Good  Estimated Sleeping Duration (Last 24 Hours): 7.50-8.50 hours  No data recorded  Physical Exam  Physical Exam Vitals reviewed.   Musculoskeletal:     Cervical back: Normal range of motion.   Neurological:     General: No focal deficit present.     Mental Status: He is oriented to person, place, and time.   Psychiatric:        Mood and Affect: Mood normal.        Behavior: Behavior normal.        Thought Content: Thought content normal.        Judgment: Judgment normal.    Review of Systems  Psychiatric/Behavioral:  Positive for substance abuse (Educated on cessation). Negative for depression, hallucinations, memory loss and suicidal ideas. The patient is nervous/anxious (stable for discharge) and has insomnia (stable for discharge).   All other systems reviewed and are negative.  Blood pressure 112/86, pulse 78, temperature 98.2 F (36.8 C), temperature source Oral, resp. rate 18, SpO2 96%. There is no height or weight on file to calculate BMI.  Demographic Factors:  Male  Loss Factors: Financial problems/change in socioeconomic status  Historical Factors: Impulsivity  Risk Reduction Factors:   Sense of responsibility to family  Continued Clinical Symptoms:   Alcohol/Substance Abuse/Dependencies Previous Psychiatric Diagnoses and Treatments  Cognitive Features That Contribute To Risk:  None    Suicide Risk:  Minimal: No identifiable suicidal ideation.  Patients presenting with no risk factors but with morbid ruminations; may be classified as minimal risk based on the severity of the depressive symptoms   Plan Of Care/Follow-up recommendations:  Activity:  Outpatient follow up  Other:    Follow up with [facility] Res[facility] Only  Walk-in hours for open access (medication management and therapy) are Monday - Friday 8 am to 11 am. Appointments are limited, so please arrive at 7:00 am. Upon arrival, please complete the form on the clipboard located at the front desk. If there are no clipboards available, all appointments have been filled for that day.  [facility] Outpatient Services [address] [place]  [address] [phone]   Disposition: Discharged, and states will be going to reside with friend until he can go to the [facility] house. Education provided on the fact that if experiencing worsening of psychiatry symptoms including suicidal ideations, homicidal ideations, or having auditory/visual hallucinations, etc, to call 911, [phone], come back to this location, or go to the nearest ER. Pt verbalized understanding.  Robet Chiquito, NP 08/10/2023, 2:51 PM

## 2023-08-12 ENCOUNTER — Ambulatory Visit: Payer: MEDICAID | Admitting: Family Medicine

## 2023-08-23 ENCOUNTER — Other Ambulatory Visit: Payer: Self-pay

## 2023-08-23 ENCOUNTER — Observation Stay (HOSPITAL_COMMUNITY)
Admission: EM | Admit: 2023-08-23 | Discharge: 2023-08-25 | Discharge status: Against Medical Advice | Payer: MEDICAID | Attending: Student | Admitting: Student

## 2023-08-23 DIAGNOSIS — G4700 Insomnia, unspecified: Secondary | ICD-10-CM | POA: Insufficient documentation

## 2023-08-23 DIAGNOSIS — F418 Other specified anxiety disorders: Secondary | ICD-10-CM | POA: Insufficient documentation

## 2023-08-23 DIAGNOSIS — R110 Nausea: Secondary | ICD-10-CM | POA: Diagnosis present

## 2023-08-23 DIAGNOSIS — F10230 Alcohol dependence with withdrawal, uncomplicated: Secondary | ICD-10-CM

## 2023-08-23 DIAGNOSIS — F10930 Alcohol use, unspecified with withdrawal, uncomplicated: Principal | ICD-10-CM | POA: Insufficient documentation

## 2023-08-23 DIAGNOSIS — R000 Tachycardia, unspecified: Secondary | ICD-10-CM | POA: Diagnosis not present

## 2023-08-23 DIAGNOSIS — F1914 Other psychoactive substance abuse with psychoactive substance-induced mood disorder: Secondary | ICD-10-CM | POA: Insufficient documentation

## 2023-08-23 DIAGNOSIS — F1020 Alcohol dependence, uncomplicated: Secondary | ICD-10-CM

## 2023-08-23 DIAGNOSIS — Z5901 Sheltered homelessness: Secondary | ICD-10-CM | POA: Diagnosis not present

## 2023-08-23 DIAGNOSIS — I10 Essential (primary) hypertension: Secondary | ICD-10-CM | POA: Diagnosis not present

## 2023-08-23 DIAGNOSIS — F17210 Nicotine dependence, cigarettes, uncomplicated: Secondary | ICD-10-CM | POA: Insufficient documentation

## 2023-08-23 DIAGNOSIS — F17200 Nicotine dependence, unspecified, uncomplicated: Secondary | ICD-10-CM | POA: Diagnosis not present

## 2023-08-23 DIAGNOSIS — F10939 Alcohol use, unspecified with withdrawal, unspecified: Principal | ICD-10-CM | POA: Diagnosis present

## 2023-08-23 LAB — CBC WITH DIFFERENTIAL/PLATELET
Abs Immature Granulocytes: 0.04 10*3/uL (ref 0.00–0.07)
Basophils Absolute: 0.1 10*3/uL (ref 0.0–0.1)
Basophils Relative: 1 %
Eosinophils Absolute: 0 10*3/uL (ref 0.0–0.5)
Eosinophils Relative: 0 %
HCT: 47.7 % (ref 39.0–52.0)
Hemoglobin: 16.2 g/dL (ref 13.0–17.0)
Immature Granulocytes: 0 %
Lymphocytes Relative: 22 %
Lymphs Abs: 2.2 10*3/uL (ref 0.7–4.0)
MCH: 31.5 pg (ref 26.0–34.0)
MCHC: 34 g/dL (ref 30.0–36.0)
MCV: 92.6 fL (ref 80.0–100.0)
Monocytes Absolute: 0.6 10*3/uL (ref 0.1–1.0)
Monocytes Relative: 6 %
Neutro Abs: 7 10*3/uL (ref 1.7–7.7)
Neutrophils Relative %: 71 %
Platelets: 214 10*3/uL (ref 150–400)
RBC: 5.15 MIL/uL (ref 4.22–5.81)
RDW: 14.6 % (ref 11.5–15.5)
WBC: 9.9 10*3/uL (ref 4.0–10.5)
nRBC: 0 % (ref 0.0–0.2)

## 2023-08-23 LAB — RAPID URINE DRUG SCREEN, HOSP PERFORMED
Amphetamines: NOT DETECTED
Barbiturates: NOT DETECTED
Benzodiazepines: POSITIVE — AB
Cocaine: NOT DETECTED
Opiates: NOT DETECTED
Tetrahydrocannabinol: NOT DETECTED

## 2023-08-23 LAB — COMPREHENSIVE METABOLIC PANEL WITH GFR
ALT: 25 U/L (ref 0–44)
AST: 41 U/L (ref 15–41)
Albumin: 4.2 g/dL (ref 3.5–5.0)
Alkaline Phosphatase: 72 U/L (ref 38–126)
Anion gap: 16 — ABNORMAL HIGH (ref 5–15)
BUN: 7 mg/dL (ref 6–20)
CO2: 21 mmol/L — ABNORMAL LOW (ref 22–32)
Calcium: 8.8 mg/dL — ABNORMAL LOW (ref 8.9–10.3)
Chloride: 99 mmol/L (ref 98–111)
Creatinine, Ser: 0.66 mg/dL (ref 0.61–1.24)
GFR, Estimated: >60 mL/min (ref >60)
Glucose, Bld: 79 mg/dL (ref 70–99)
Potassium: 3.5 mmol/L (ref 3.5–5.1)
Sodium: 136 mmol/L (ref 135–145)
Total Bilirubin: 1 mg/dL (ref 0.0–1.2)
Total Protein: 7.6 g/dL (ref 6.5–8.1)

## 2023-08-23 LAB — ETHANOL: Alcohol, Ethyl (B): 35 mg/dL — ABNORMAL HIGH (ref <15)

## 2023-08-23 MED ORDER — LACTATED RINGERS IV BOLUS
1000.0000 mL | Freq: Once | INTRAVENOUS | Status: AC
  Administered 2023-08-23: 1000 mL via INTRAVENOUS

## 2023-08-23 MED ORDER — LORAZEPAM 2 MG/ML IJ SOLN: 1.0000 mg | INTRAMUSCULAR | Status: DC | PRN

## 2023-08-23 MED ORDER — ADULT MULTIVITAMIN W/MINERALS CH
1.0000 | ORAL_TABLET | Freq: Every day | ORAL | Status: DC
  Administered 2023-08-23 – 2023-08-25 (×3): 1 via ORAL
  Filled 2023-08-23 (×3): qty 1

## 2023-08-23 MED ORDER — ONDANSETRON HCL 4 MG PO TABS: 4.0000 mg | ORAL_TABLET | Freq: Four times a day (QID) | ORAL | Status: DC | PRN

## 2023-08-23 MED ORDER — SERTRALINE HCL 25 MG PO TABS
25.0000 mg | ORAL_TABLET | Freq: Every day | ORAL | Status: DC
  Administered 2023-08-24: 25 mg via ORAL
  Filled 2023-08-23: qty 1

## 2023-08-23 MED ORDER — ACETAMINOPHEN 650 MG RE SUPP: 650.0000 mg | Freq: Four times a day (QID) | RECTAL | Status: DC | PRN

## 2023-08-23 MED ORDER — THIAMINE MONONITRATE 100 MG PO TABS
100.0000 mg | ORAL_TABLET | Freq: Every day | ORAL | Status: DC
  Administered 2023-08-23 – 2023-08-25 (×3): 100 mg via ORAL
  Filled 2023-08-23 (×3): qty 1

## 2023-08-23 MED ORDER — PROPRANOLOL HCL 10 MG PO TABS: 10.0000 mg | ORAL_TABLET | Freq: Two times a day (BID) | ORAL | Status: DC | PRN

## 2023-08-23 MED ORDER — TRAZODONE HCL 100 MG PO TABS
100.0000 mg | ORAL_TABLET | Freq: Every day | ORAL | Status: DC
  Administered 2023-08-24: 100 mg via ORAL
  Filled 2023-08-23: qty 1

## 2023-08-23 MED ORDER — GABAPENTIN 300 MG PO CAPS
300.0000 mg | ORAL_CAPSULE | Freq: Three times a day (TID) | ORAL | Status: DC
  Administered 2023-08-23 – 2023-08-25 (×5): 300 mg via ORAL
  Filled 2023-08-23 (×5): qty 1

## 2023-08-23 MED ORDER — ENOXAPARIN SODIUM 40 MG/0.4ML IJ SOSY
40.0000 mg | PREFILLED_SYRINGE | INTRAMUSCULAR | Status: DC
  Administered 2023-08-24 – 2023-08-25 (×2): 40 mg via SUBCUTANEOUS
  Filled 2023-08-23 (×2): qty 0.4

## 2023-08-23 MED ORDER — ACETAMINOPHEN 325 MG PO TABS: 650.0000 mg | ORAL_TABLET | Freq: Four times a day (QID) | ORAL | Status: DC | PRN

## 2023-08-23 MED ORDER — ONDANSETRON HCL 4 MG/2ML IJ SOLN: 4.0000 mg | Freq: Four times a day (QID) | INTRAMUSCULAR | Status: DC | PRN

## 2023-08-23 MED ORDER — HYDROXYZINE HCL 50 MG PO TABS: 50.0000 mg | ORAL_TABLET | Freq: Three times a day (TID) | ORAL | Status: DC | PRN

## 2023-08-23 MED ORDER — NICOTINE 21 MG/24HR TD PT24
21.0000 mg | MEDICATED_PATCH | Freq: Every day | TRANSDERMAL | Status: DC
  Administered 2023-08-24 – 2023-08-25 (×2): 21 mg via TRANSDERMAL
  Filled 2023-08-23 (×2): qty 1

## 2023-08-23 MED ORDER — THIAMINE HCL 100 MG/ML IJ SOLN: 100.0000 mg | Freq: Every day | INTRAMUSCULAR | Status: DC

## 2023-08-23 MED ORDER — NALTREXONE HCL 50 MG PO TABS
50.0000 mg | ORAL_TABLET | Freq: Every day | ORAL | Status: DC
  Administered 2023-08-24 – 2023-08-25 (×2): 50 mg via ORAL
  Filled 2023-08-23 (×2): qty 1

## 2023-08-23 MED ORDER — SODIUM CHLORIDE 0.9 % IV SOLN: INTRAVENOUS | Status: AC

## 2023-08-23 MED ORDER — LORAZEPAM 1 MG PO TABS
1.0000 mg | ORAL_TABLET | ORAL | Status: DC | PRN
  Administered 2023-08-23: 1 mg via ORAL
  Administered 2023-08-23 – 2023-08-24 (×4): 2 mg via ORAL
  Administered 2023-08-25: 1 mg via ORAL
  Filled 2023-08-23 (×2): qty 2
  Filled 2023-08-23: qty 1
  Filled 2023-08-23 (×2): qty 2
  Filled 2023-08-23: qty 1

## 2023-08-23 MED ORDER — FOLIC ACID 1 MG PO TABS
1.0000 mg | ORAL_TABLET | Freq: Every day | ORAL | Status: DC
  Administered 2023-08-23 – 2023-08-24 (×2): 1 mg via ORAL
  Filled 2023-08-23 (×3): qty 1

## 2023-08-23 MED ORDER — SENNOSIDES-DOCUSATE SODIUM 8.6-50 MG PO TABS: 1.0000 | ORAL_TABLET | Freq: Every evening | ORAL | Status: DC | PRN

## 2023-08-23 NOTE — ED Triage Notes (Signed)
 Patient to ED by POV for detox from alcohol states his last drink was yesterday.

## 2023-08-23 NOTE — H&P (Addendum)
 History and Physical  John [name] FMW:[id number] DOB: 1980-07-09 DOA: 08/23/2023  PCP: Freddrick, No   Chief Complaint: Nausea and tremor  HPI: John [name] is a 43 y.o. male with medical history significant for homelessness, alcohol use disorder, substance-induced mood disorder, substance use disorder, tobacco use disorder, chronic back pain, ASD and HTN who presented to the ED for evaluation of nausea and tremor in the setting of alcohol withdrawal.  Patient reports that he went to the [place] [facility] and they noticed that he was shaking and had elevated heart rate so he was advised to present to the ED for detox.  Patient also endorses some mild nausea and nervousness but denies any palpitations, vomiting, abdominal pain, dizziness, headache, chest pain or shortness of breath.  Reports he drinks 4 40 oz bottles per day and sometimes drinks until he passes out.  He also smokes about a pack of cigarettes per day when drinking.  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 He was informed that he can return back to the [place] [facility] after his detox.  ED Course: Initial vitals show temp 98.2, RR 18, HR 128, BP 116/90, SpO2 96% on room air. Initial labs significant for sodium 136, K+ 3.5, bicarb 21, creatinine 0.66, glucose 79, normal CBC, ethanol 35, UDS positive for benzos. Pt received IV LR 1 L bolus and IV Ativan  1 mg for CIWA score of 5.TRH was consulted for admission.   Review of Systems: Please see HPI for pertinent positives and negatives. A complete 10 system review of systems are otherwise negative.  Past Medical History:  Diagnosis Date   ASD (atrial septal defect)    Bacterial infection due to H. pylori    Hypertension    Kidney stones    Past Surgical History:  Procedure Laterality Date   ASD REPAIR     FOOT SURGERY Right    KNEE SURGERY Right    Social History:  reports that he has been smoking cigarettes. He has never used smokeless tobacco. He reports  current alcohol use. He reports that he does not currently use drugs.  Allergies  Allergen Reactions   Penicillins     Did it involve swelling of the face/tongue/throat, SOB, or low BP? No Did it involve sudden or severe rash/hives, skin peeling, or any reaction on the inside of your mouth or nose? No Did you need to seek medical attention at a hospital or doctor's office? Unknown When did it last happen?       If all above answers are "NO", may proceed with cephalosporin use.    No family history on file.   Prior to Admission medications   Medication Sig Start Date End Date Taking? Authorizing Provider  gabapentin  (NEURONTIN ) 300 MG capsule Take 1 capsule (300 mg total) by mouth 3 (three) times daily. 08/09/23   Lynnette Barter, MD  hydrOXYzine  (ATARAX ) 50 MG tablet Take 1 tablet (50 mg total) by mouth 3 (three) times daily as needed for anxiety. 08/09/23   Lynnette Barter, MD  naltrexone  (DEPADE) 50 MG tablet Take 1 tablet (50 mg total) by mouth daily. 08/09/23   Lynnette Barter, MD  nicotine  (NICODERM CQ  - DOSED IN MG/24 HOURS) 21 mg/24hr patch Place 1 patch (21 mg total) onto the skin daily. 08/09/23   Lynnette Barter, MD  propranolol  (INDERAL ) 10 MG tablet Take 1 tablet (10 mg total) by mouth 2 (two) times daily as needed. 08/09/23   Lynnette,  Prentice, MD  traZODone  (DESYREL ) 100 MG tablet Take 0.5-1 tablets (50-100 mg total) by mouth at bedtime as needed for sleep. 08/09/23   Lynnette Prentice, MD    Physical Exam: BP (!) 143/85 (BP Location: Left Arm)   Pulse (!) 112   Temp 98.7 F (37.1 C) (Oral)   Resp 17   Ht 5' 6 (1.676 m)   Wt 79.4 kg   SpO2 92%   BMI 28.25 kg/m  General: Pleasant, slightly disheveled middle-age man sitting on the hallway bed. No acute distress. HEENT: [place]/AT. Anicteric sclera. Poor dentition. CV: Tachycardic. Regular rhythm. No murmurs, rubs, or gallops. No LE edema Pulmonary: Lungs CTAB. Normal effort. No wheezing or rales. Abdominal: Soft, nontender, nondistended. Normal bowel  sounds. Extremities: Palpable radial and DP pulses. Normal ROM. Skin: Warm and dry. No obvious rash or lesions. Neuro: A&Ox3. Moves all extremities.  No tremors. Normal sensation to light touch. No focal deficit. Psych: Mild anxiety.          Labs on Admission:  Basic Metabolic Panel: Recent Labs  Lab 08/23/23 1752  NA 136  K 3.5  CL 99  CO2 21*  GLUCOSE 79  BUN 7  CREATININE 0.66  CALCIUM  8.8*   Liver Function Tests: Recent Labs  Lab 08/23/23 1752  AST 41  ALT 25  ALKPHOS 72  BILITOT 1.0  PROT 7.6  ALBUMIN 4.2   No results for input(s): LIPASE, AMYLASE in the last 168 hours. No results for input(s): AMMONIA in the last 168 hours. CBC: Recent Labs  Lab 08/23/23 1752  WBC 9.9  NEUTROABS 7.0  HGB 16.2  HCT 47.7  MCV 92.6  PLT 214   Cardiac Enzymes: No results for input(s): CKTOTAL, CKMB, CKMBINDEX, TROPONINI in the last 168 hours. BNP (last 3 results) No results for input(s): BNP in the last 8760 hours.  ProBNP (last 3 results) No results for input(s): PROBNP in the last 8760 hours.  CBG: No results for input(s): GLUCAP in the last 168 hours.  Radiological Exams on Admission: No results found. Assessment/Plan John [name] is a 43 y.o. male with medical history significant for homelessness, alcohol use disorder, substance-induced mood disorder, substance use disorder, tobacco use disorder, chronic back pain, ASD and HTN who presented to the ED for evaluation of nausea and tremor in the setting of alcohol withdrawal.   # Alcohol use disorder # Alcohol withdrawal - Pt with hx of EtOH abuse presented with tremors and nausea - Drinks 4 40 oz bottles per day, last drink was the night of 6/28 - Found to be tachycardic and hypertensive with ethanol level of 35 on admission - Initial CIWA score of 5 - Admit for close observation - Continue CIWA with Ativan  - Continue home naltrexone  and gabapentin  - Folate, multivitamin and thiamine  -  Check mag and Phos levels - TOC consulted for substance use resources  # Substance-induced mood disorder # Anxiety and depression - Mood disorder mostly due to his alcohol abuse - Has had a history of suicidal ideations but denies this currently - Continue sertraline and PRN hydroxyzine   # Tachycardia - Reports a history of elevated heart rate with a recently prescribed PRN propranolol  - HR elevated today to 110-120s likely in the setting of EtOH withdrawal and dehydration - Continue propranolol  for HR >110 - S/p IV LR 1 L bolus in the ED, start IV NS 100 cc/h for 10 hours - Check EKG  # Tobacco use disorder - Reports smoking 1 pack of  cigarette/day - Has nicotine  patch on, will continue  # HTN - BP elevated with SBP in the 110s to 140s in the setting of EtOH withdrawal - CTM  # Homelessness - Currently residing at [place] [facility] - Plan to return to [place] [facility] at discharge  # Insomnia - Continue trazodone  at bedtime  DVT prophylaxis: Lovenox      Code Status: Full Code  Consults called: None  Family Communication: No family at bedside  Severity of Illness: The appropriate patient status for this patient is OBSERVATION. Observation status is judged to be reasonable and necessary in order to provide the required intensity of service to ensure the patient's safety. The patient's presenting symptoms, physical exam findings, and initial radiographic and laboratory data in the context of their medical condition is felt to place them at decreased risk for further clinical deterioration. Furthermore, it is anticipated that the patient will be medically stable for discharge from the hospital within 2 midnights of admission.   Level of care: Progressive   This record has been created using Conservation officer, historic buildings. Errors have been sought and corrected, but may not always be located. Such creation errors do not reflect on the standard of care.   Lou Claretta HERO,  MD 08/23/2023, 7:33 PM [facility] Pager: [phone] Isaiah 41:10   If 7PM-7AM, please contact night-coverage [email or web address] Password [email or web address]

## 2023-08-23 NOTE — ED Provider Notes (Signed)
 [place] EMERGENCY DEPARTMENT AT [facility] Provider Note  CSN: [id number] Arrival date & time: 08/23/23 1739  Chief Complaint(s) Alcohol Intoxication  HPI John [name] is a 43 y.o. male with PMH alcohol abuse, ASD, HTN who presents emerged from for evaluation of alcohol withdrawal.  States that he is feeling nauseous and shaky and arrives tremoring with tachycardia.  States that last drink was last night.  Has not had a seizure previously.  Denies abdominal pain, headache, fever or other systemic symptoms.   Past Medical History Past Medical History:  Diagnosis Date   ASD (atrial septal defect)    Bacterial infection due to H. 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 Hx urethroplasty for urethral trauma in 2020 04/30/2019   Alcohol abuse with intoxication (HCC) 04/30/2019   Acute Femoral condyle fracture (HCC) 04/30/2019   Alcohol abuse 01/14/2018   Substance induced mood disorder (HCC) 01/14/2018   Home Medication(s) Prior to Admission medications   Medication Sig Start Date End Date Taking? Authorizing Provider  gabapentin  (NEURONTIN ) 300 MG capsule Take 1 capsule (300 mg total) by mouth 3 (three) times daily. 08/09/23   Lynnette Barter, MD  hydrOXYzine  (ATARAX ) 50 MG tablet Take 1 tablet (50 mg total) by mouth 3 (three) times daily as needed for anxiety. 08/09/23   Lynnette Barter, MD  naltrexone  (DEPADE) 50 MG tablet Take 1 tablet (50 mg total) by mouth daily. 08/09/23   Lynnette Barter, MD  nicotine  (NICODERM CQ  - DOSED IN MG/24 HOURS) 21 mg/24hr patch Place 1 patch (21 mg total) onto the  skin daily. 08/09/23   Lynnette Barter, MD  propranolol  (INDERAL ) 10 MG tablet Take 1 tablet (10 mg total) by mouth 2 (two) times daily as needed. 08/09/23   Lynnette Barter, MD  traZODone  (DESYREL ) 100 MG tablet Take 0.5-1 tablets (50-100 mg total) by mouth at bedtime as needed for sleep. 08/09/23   Lynnette Barter, MD                                                                                                                                    Past Surgical History Past Surgical History:  Procedure Laterality Date   ASD REPAIR     FOOT SURGERY Right    KNEE SURGERY Right    Family History No family history on file.  Social History Social History   Tobacco Use   Smoking status: Every Day    Types: Cigarettes   Smokeless tobacco: Never  Tobacco comments:    down to 2 cigarettes a day  Vaping Use   Vaping status: Never Used  Substance Use Topics   Alcohol use: Yes    Comment: Last consumed Friday 06/10/19, pt states he is an alcoholic and wants to drink   Drug use: Not Currently   Allergies Penicillins  Review of Systems Review of Systems  Gastrointestinal:  Positive for nausea.  Neurological:  Positive for tremors.    Physical Exam Vital Signs  I have reviewed the triage vital signs BP (!) 143/85 (BP Location: Left Arm)   Pulse (!) 112   Temp 98.7 F (37.1 C) (Oral)   Resp 17   Ht 5' 6 (1.676 m)   Wt 79.4 kg   SpO2 92%   BMI 28.25 kg/m   Physical Exam Constitutional:      General: He is not in acute distress.    Appearance: Normal appearance.  HENT:     Head: Normocephalic and atraumatic.     Nose: No congestion or rhinorrhea.   Eyes:     General:        Right eye: No discharge.        Left eye: No discharge.     Extraocular Movements: Extraocular movements intact.     Pupils: Pupils are equal, round, and reactive to light.    Cardiovascular:     Rate and Rhythm: Regular rhythm. Tachycardia present.     Heart sounds: No murmur heard. Pulmonary:      Effort: No respiratory distress.     Breath sounds: No wheezing or rales.  Abdominal:     General: There is no distension.     Tenderness: There is no abdominal tenderness.   Musculoskeletal:        General: Normal range of motion.     Cervical back: Normal range of motion.   Skin:    General: Skin is warm and dry.   Neurological:     General: No focal deficit present.     Mental Status: He is alert.     Comments: tremoring    ED Results and Treatments Labs (all labs ordered are listed, but only abnormal results are displayed) Labs Reviewed  COMPREHENSIVE METABOLIC PANEL WITH GFR - Abnormal; Notable for the following components:      Result Value   CO2 21 (*)    Calcium  8.8 (*)    Anion gap 16 (*)    All other components within normal limits  ETHANOL - Abnormal; Notable for the following components:   Alcohol, Ethyl (B) 35 (*)    All other components within normal limits  CBC WITH DIFFERENTIAL/PLATELET  RAPID URINE DRUG SCREEN, HOSP PERFORMED                                                                                                                          Radiology No results found.  Pertinent labs & imaging results that were available during my care  of the patient were reviewed by me and considered in my medical decision making (see MDM for details).  Medications Ordered in ED Medications  LORazepam  (ATIVAN ) tablet 1-4 mg (1 mg Oral Given 08/23/23 1804)    Or  LORazepam  (ATIVAN ) injection 1-4 mg ( Intravenous See Alternative 08/23/23 1804)  thiamine  (VITAMIN B1) tablet 100 mg (100 mg Oral Given 08/23/23 1803)    Or  thiamine  (VITAMIN B1) injection 100 mg ( Intravenous See Alternative 08/23/23 1803)  folic acid  (FOLVITE ) tablet 1 mg (1 mg Oral Given 08/23/23 1804)  multivitamin with minerals tablet 1 tablet (1 tablet Oral Given 08/23/23 1803)  lactated ringers  bolus 1,000 mL (1,000 mLs Intravenous New Bag/Given 08/23/23 1834)                                                                                                                                      Procedures Procedures  (including critical care time)  Medical Decision Making / ED Course   This patient presents to the ED for concern of tremors, nausea, alcohol withdrawal, this involves an extensive number of treatment options, and is a complaint that carries with it a high risk of complications and morbidity.  The differential diagnosis includes alcohol withdrawal, electrolyte abnormality, polysubstance use, homelessness  MDM: Patient seen emergency room for evaluation of alcohol withdrawal.  Physical exam reveals an uncomfortable appearing tremoring patient that has a regular tachycardia but is otherwise unremarkable.  Laboratory evaluation largely unremarkable outside of an alcohol level of 35.  Initial CIWA was 5 and patient received oral Ativan  with improvement of symptoms.  On reevaluation he states he still feels poorly and was told that by [facility] that he was going to be staying in the hospital for 7-day detox.  It was very clear with the patient that the hospital does not do detox but can help him in the short-term through the life-threatening phase of alcohol withdrawal.  Require hospital admission for alcohol withdrawal.   Additional history obtained:  -External records from outside source obtained and reviewed including: Chart review including previous notes, labs, imaging, consultation notes   Lab Tests: -I ordered, reviewed, and interpreted labs.   The pertinent results include:   Labs Reviewed  COMPREHENSIVE METABOLIC PANEL WITH GFR - Abnormal; Notable for the following components:      Result Value   CO2 21 (*)    Calcium  8.8 (*)    Anion gap 16 (*)    All other components within normal limits  ETHANOL - Abnormal; Notable for the following components:   Alcohol, Ethyl (B) 35 (*)    All other components within normal limits  CBC WITH DIFFERENTIAL/PLATELET  RAPID URINE  DRUG SCREEN, HOSP PERFORMED       Medicines ordered and prescription drug management: Meds ordered this encounter  Medications   OR Linked Order Group    LORazepam  (ATIVAN ) tablet 1-4 mg  CIWA-AR < 5 =:   0 mg     CIWA-AR 5 -10 =:   1 mg     CIWA-AR 11 -15 =:   2 mg     CIWA-AR 16 -20 =:   3 mg     CIWA-AR 16 -20 =:   Recheck CIWA-AR in 1 hour; if > 20 notify MD     CIWA-AR > 20 =:   4 mg     CIWA-AR > 20 =:   Call Rapid Response    LORazepam  (ATIVAN ) injection 1-4 mg     CIWA-AR < 5 =:   0 mg     CIWA-AR 5 -10 =:   1 mg     CIWA-AR 11 -15 =:   2 mg     CIWA-AR 16 -20 =:   3 mg     CIWA-AR 16 -20 =:   Recheck CIWA-AR in 1 hour; if > 20 notify MD     CIWA-AR > 20 =:   4 mg     CIWA-AR > 20 =:   Call Rapid Response   OR Linked Order Group    thiamine  (VITAMIN B1) tablet 100 mg    thiamine  (VITAMIN B1) injection 100 mg   folic acid  (FOLVITE ) tablet 1 mg   multivitamin with minerals tablet 1 tablet   lactated ringers  bolus 1,000 mL    -I have reviewed the patients home medicines and have made adjustments as needed  Critical interventions none   Cardiac Monitoring: The patient was maintained on a cardiac monitor.  I personally viewed and interpreted the cardiac monitored which showed an underlying rhythm of: Sinus tachycardia  Social Determinants of Health:  Factors impacting patients care include: Homeless   Reevaluation: After the interventions noted above, I reevaluated the patient and found that they have :improved  Co morbidities that complicate the patient evaluation  Past Medical History:  Diagnosis Date   ASD (atrial septal defect)    Bacterial infection due to H. pylori    Hypertension    Kidney stones       Dispostion: I considered admission for this patient, and patient require hospital mission for alcohol withdrawal.     Final Clinical Impression(s) / ED Diagnoses Final diagnoses:  None     @PCDICTATION @    Albertina Dixon,  MD 08/23/23 1930

## 2023-08-24 ENCOUNTER — Encounter (HOSPITAL_COMMUNITY): Payer: Self-pay | Admitting: Student

## 2023-08-24 DIAGNOSIS — F10930 Alcohol use, unspecified with withdrawal, uncomplicated: Secondary | ICD-10-CM | POA: Diagnosis not present

## 2023-08-24 LAB — MRSA NEXT GEN BY PCR, NASAL: MRSA by PCR Next Gen: NOT DETECTED

## 2023-08-24 LAB — BASIC METABOLIC PANEL WITH GFR
Anion gap: 8 (ref 5–15)
BUN: 10 mg/dL (ref 6–20)
CO2: 27 mmol/L (ref 22–32)
Calcium: 9.2 mg/dL (ref 8.9–10.3)
Chloride: 103 mmol/L (ref 98–111)
Creatinine, Ser: 0.75 mg/dL (ref 0.61–1.24)
GFR, Estimated: >60 mL/min (ref >60)
Glucose, Bld: 88 mg/dL (ref 70–99)
Potassium: 3.7 mmol/L (ref 3.5–5.1)
Sodium: 138 mmol/L (ref 135–145)

## 2023-08-24 LAB — PHOSPHORUS: Phosphorus: 4.1 mg/dL (ref 2.5–4.6)

## 2023-08-24 LAB — CBC
HCT: 46 % (ref 39.0–52.0)
Hemoglobin: 15.1 g/dL (ref 13.0–17.0)
MCH: 31.7 pg (ref 26.0–34.0)
MCHC: 32.8 g/dL (ref 30.0–36.0)
MCV: 96.4 fL (ref 80.0–100.0)
Platelets: 198 10*3/uL (ref 150–400)
RBC: 4.77 MIL/uL (ref 4.22–5.81)
RDW: 14.7 % (ref 11.5–15.5)
WBC: 7.7 10*3/uL (ref 4.0–10.5)
nRBC: 0 % (ref 0.0–0.2)

## 2023-08-24 LAB — HIV ANTIBODY (ROUTINE TESTING W REFLEX): HIV Screen 4th Generation wRfx: NONREACTIVE

## 2023-08-24 LAB — MAGNESIUM: Magnesium: 2 mg/dL (ref 1.7–2.4)

## 2023-08-24 MED ORDER — SODIUM CHLORIDE 0.9 % IV SOLN: INTRAVENOUS | Status: AC

## 2023-08-24 MED ORDER — DIAZEPAM 5 MG PO TABS
5.0000 mg | ORAL_TABLET | Freq: Four times a day (QID) | ORAL | Status: DC
  Administered 2023-08-24 – 2023-08-25 (×5): 5 mg via ORAL
  Filled 2023-08-24 (×5): qty 1

## 2023-08-24 NOTE — Progress Notes (Signed)
 Pt had suicide reassessment requirement for documentation due to error. Answers scored him a high risk pt. MD made aware. At this time, pt has no active plans or intent to act on any previous thoughts. Notified that pt does not currently need a SI sitter.

## 2023-08-24 NOTE — Progress Notes (Signed)
 PROGRESS NOTE  John [name]  DOB: Mar 18, 1980  PCP: Freddrick Johns FMW:[id number]  DOA: 08/23/2023  LOS: 0 days  Hospital Day: 2  Brief narrative: John [name] is a 43 y.o. male with PMH significant for homelessness, chronic alcohol use, polysubstance abuse, mood disorder, chronic back pain, HTN, atrial septal defect  6/29, patient presented to the ED with complaint of nausea, tremor requesting for alcohol detox, last drink was the day before. Patient was recently admitted with health unit for alcohol withdrawal, suicidal ideation.  He was discharged on 6/16.  Reports he drinks 4 40 oz bottles per day and sometimes drinks until he passes out.  He also smokes about a pack of cigarettes per day when drinking.  He admits to [facility] use and reports using meth last month but is now clean.  6/29, patient presented at the [facility] asking for detox.  They noticed him shaking, tachycardic and advised to go to ED for detox. He reports that he has been informed at [facility] house that he can return back to them after his detox.  In the ED, patient was not alcohol withdrawal with tremors, tachycardic to 128, blood pressure normal, breathing on room air He received oral Ativan  with improvement of symptoms And labs with sodium 136, potassium 3.5, renal function normal, blood alcohol level elevated at 35, UDS positive for benzos Patient was started on IV fluid Admitted to [facility]  Subjective: Patient was seen and examined this morning. Middle-aged Caucasian male.  Alert, awake, mild tremors.  Able to have a minimal communication.  Denies any suicidal ideation. Chart reviewed. Overnight, no fever, heart rate in 70s, blood pressure in normal range, breathing on 2 L oxygen Labs this morning with CBC, CMP unremarkable  Assessment and plan: Alcohol withdrawal symptoms Chronic alcoholism Reports drinking about four 40 oz bottles per day, last drink was the night of 6/28. Was at [facility] for detox, noted to  have tremors, tachycardia and and sent to the ED with concern of alcohol withdrawal. Currently on CIWA protocol with as needed Ativan .  Will add scheduled Valium for next 2 days  Continue home naltrexone  and gabapentin  Tachycardia, tremors improving Folate, multivitamin and thiamine  TOC consulted for substance use resources  Substance-induced mood disorder Anxiety and depression H/o suicidal ideation Mood disorder mostly due to his alcohol abuse Has had a history of suicidal ideations but denies this currently on my questioning this morning. Continue sertraline and PRN hydroxyzine    Tobacco use disorder Reports smoking 1 pack of cigarette/day Has nicotine  patch on, will continue   Homelessness Currently residing at [place] [facility] Plan to return to [facility] at discharge   Insomnia Continue trazodone  at bedtime   Mobility: Encourage ambulation  Goals of care   Code Status: Full Code     DVT prophylaxis:  enoxaparin  (LOVENOX ) injection 40 mg Start: 08/24/23 1000   Antimicrobials: None Fluid: NS at 100 mL/h Consultants: None Family Communication: None at bedside  Status: Observation Level of care:  Progressive   Patient is from: Homeless Needs to continue in-hospital care: Being watched for alcohol withdrawal symptoms Anticipated d/c to: Hopefully 1 to 2 days.  Patient reports [facility] has asked him to call them prior to discharge to be picked up    Diet:  Diet Order             Diet regular Room service appropriate? Yes; Fluid consistency: Thin  Diet effective now  Scheduled Meds:  diazepam  5 mg Oral Q6H   enoxaparin  (LOVENOX ) injection  40 mg Subcutaneous Q24H   folic acid   1 mg Oral Daily   gabapentin   300 mg Oral TID   multivitamin with minerals  1 tablet Oral Daily   naltrexone   50 mg Oral Daily   nicotine   21 mg Transdermal Daily   sertraline  25 mg Oral QHS   thiamine   100 mg Oral Daily   Or   thiamine   100 mg  Intravenous Daily   traZODone   100 mg Oral QHS    PRN meds: acetaminophen  **OR** acetaminophen , hydrOXYzine , LORazepam  **OR** LORazepam , ondansetron  **OR** ondansetron  (ZOFRAN ) IV, propranolol , senna-docusate   Infusions:   sodium chloride  100 mL/hr at 08/24/23 1005    Antimicrobials: Anti-infectives (From admission, onward)    None       Objective: Vitals:   08/24/23 1056 08/24/23 1209  BP: 114/73 118/85  Pulse: 92 90  Resp:    Temp:    SpO2:      Intake/Output Summary (Last 24 hours) at 08/24/2023 1254 Last data filed at 08/24/2023 1208 Gross per 24 hour  Intake 2328 ml  Output 1300 ml  Net 1028 ml   Filed Weights   08/23/23 1743  Weight: 79.4 kg   Weight change:  Body mass index is 28.25 kg/m.   Physical Exam: General exam: Pleasant, middle-aged.  Not in distress currently Skin: No rashes, lesions or ulcers. HEENT: Atraumatic, normocephalic, no obvious bleeding Lungs: Clear to auscultation bilaterally,  CVS: S1, S2, no murmur,   GI/Abd: Soft, nontender, nondistended, bowel sound present,   CNS: Alert, awake, able to answer orientation questions.  Mild tremors Psychiatry: Mood appropriate,  Extremities: No pedal edema, no calf tenderness,   Data Review: I have personally reviewed the laboratory data and studies available.  F/u labs ordered Unresulted Labs (From admission, onward)    None       Signed, Chapman Rota, MD [facility] 08/24/2023

## 2023-08-25 DIAGNOSIS — F10930 Alcohol use, unspecified with withdrawal, uncomplicated: Secondary | ICD-10-CM | POA: Diagnosis not present

## 2023-08-25 NOTE — Discharge Summary (Signed)
 Discharge AGAINST MEDICAL ADVICE  John [name] [facility] FMW:[id number] DOB: 1980-08-05 DOA: 08/23/2023  PCP: Pcp, No  Admit date: 08/23/2023 Discharge date: 08/25/2023 left AMA  Admitted From: Home   Code Status: Full Code   Discharge Diagnosis:   Principal Problem:   Alcohol withdrawal (HCC) Active Problems:   Tachycardia   Alcohol use disorder, severe, dependence (HCC)    History of Present Illness / Brief narrative:  John [name] is a 43 y.o. male with PMH significant for homelessness, chronic alcohol use, polysubstance abuse, mood disorder, chronic back pain, HTN, atrial septal defect   6/29, patient presented to the ED with complaint of nausea, tremor requesting for alcohol detox, last drink was the day before. Patient was recently admitted with health unit for alcohol withdrawal, suicidal ideation.  He was discharged on 6/16.   Reports he drinks 4 40 oz bottles per day and sometimes drinks until he passes out.  He also smokes about a pack of cigarettes per day when drinking.  He admits to [facility] use and reports using meth last month but is now clean.   6/29, patient presented at the [facility] house asking for detox.  They noticed him shaking, tachycardic and advised to go to ED for detox. He reports that he has been informed at [facility] house that he can return back to them after his detox.   In the ED, patient was not alcohol withdrawal with tremors, tachycardic to 128, blood pressure normal, breathing on room air He received oral Ativan  with improvement of symptoms And labs with sodium 136, potassium 3.5, renal function normal, blood alcohol level elevated at 35, UDS positive for benzos Patient was started on IV fluid Admitted to [facility]  Patient was started on CIWA protocol with scheduled Valium and as needed Ativan .  He was in the process of tapering Valium.  He told me this morning that he has arranged to pick up to go to [facility] house tomorrow morning. This afternoon however, he  decided to leave.  He did not want to follow the recommendation and hence left AMA.   Medical Consultants:     Discharge Exam:  Not applicable.   Discharge Instructions:   The results of significant diagnostics from this hospitalization (including imaging, microbiology, ancillary and laboratory) are listed below for reference.    Procedures and Diagnostic Studies:   No results found.   Labs:   Basic Metabolic Panel: Recent Labs  Lab 08/23/23 1752 08/24/23 0416  NA 136 138  K 3.5 3.7  CL 99 103  CO2 21* 27  GLUCOSE 79 88  BUN 7 10  CREATININE 0.66 0.75  CALCIUM  8.8* 9.2  MG  --  2.0  PHOS  --  4.1   GFR Estimated Creatinine Clearance: 115.5 mL/min (by C-G formula based on SCr of 0.75 mg/dL). Liver Function Tests: Recent Labs  Lab 08/23/23 1752  AST 41  ALT 25  ALKPHOS 72  BILITOT 1.0  PROT 7.6  ALBUMIN 4.2   No results for input(s): LIPASE, AMYLASE in the last 168 hours. No results for input(s): AMMONIA in the last 168 hours. Coagulation profile No results for input(s): INR, PROTIME in the last 168 hours.  CBC: Recent Labs  Lab 08/23/23 1752 08/24/23 0416  WBC 9.9 7.7  NEUTROABS 7.0  --   HGB 16.2 15.1  HCT 47.7 46.0  MCV 92.6 96.4  PLT 214 198   Cardiac Enzymes: No results for input(s): CKTOTAL, CKMB, CKMBINDEX, TROPONINI in the last 168  hours. BNP: Invalid input(s): POCBNP CBG: No results for input(s): GLUCAP in the last 168 hours. D-Dimer No results for input(s): DDIMER in the last 72 hours. Hgb A1c No results for input(s): HGBA1C in the last 72 hours. Lipid Profile No results for input(s): CHOL, HDL, LDLCALC, TRIG, CHOLHDL, LDLDIRECT in the last 72 hours. Thyroid  function studies No results for input(s): TSH, T4TOTAL, T3FREE, THYROIDAB in the last 72 hours.  Invalid input(s): FREET3 Anemia work up No results for input(s): VITAMINB12, FOLATE, FERRITIN, TIBC, IRON,  RETICCTPCT in the last 72 hours. Microbiology Recent Results (from the past 240 hours)  MRSA Next Gen by PCR, Nasal     Status: None   Collection Time: 08/24/23  6:47 AM   Specimen: Nasal Mucosa; Nasal Swab  Result Value Ref Range Status   MRSA by PCR Next Gen NOT DETECTED NOT DETECTED Final    Comment: (NOTE) The GeneXpert MRSA Assay (FDA approved for NASAL specimens only), is one component of a comprehensive MRSA colonization surveillance program. It is not intended to diagnose MRSA infection nor to guide or monitor treatment for MRSA infections. Test performance is not FDA approved in patients less than 30 years old. Performed at [facility], [address] [place], [place] [address]     Signed: Chapman Chaitanya Amedee  [facility] 08/25/2023, 6:06 PM

## 2023-08-25 NOTE — Progress Notes (Signed)
 Patient came out in hall to desk requestig to be discharged. Spoke with MD Dahal & patient is not medically ready for a safe discharge. Patient A/Ox4 & is planning  to leave AMA & has signed the [employer] paperwork. IV removed. Patient educated on health/safety risks/concerns that are possible with not completing care.

## 2023-08-25 NOTE — Progress Notes (Signed)
 PROGRESS NOTE  Guilherme Schwenke Aul  DOB: 1980/03/23  PCP: Freddrick Johns FMW:[id number]  DOA: 08/23/2023  LOS: 0 days  Hospital Day: 3  Brief narrative: John [name] is a 43 y.o. male with PMH significant for homelessness, chronic alcohol use, polysubstance abuse, mood disorder, chronic back pain, HTN, atrial septal defect  6/29, patient presented to the ED with complaint of nausea, tremor requesting for alcohol detox, last drink was the day before. Patient was recently admitted with health unit for alcohol withdrawal, suicidal ideation.  He was discharged on 6/16.  Reports he drinks 4 40 oz bottles per day and sometimes drinks until he passes out.  He also smokes about a pack of cigarettes per day when drinking.  He admits to [facility] use and reports using meth last month but is now clean.  6/29, patient presented at the [facility] house asking for detox.  They noticed him shaking, tachycardic and advised to go to ED for detox. He reports that he has been informed at [facility] house that he can return back to them after his detox.  In the ED, patient was not alcohol withdrawal with tremors, tachycardic to 128, blood pressure normal, breathing on room air He received oral Ativan  with improvement of symptoms And labs with sodium 136, potassium 3.5, renal function normal, blood alcohol level elevated at 35, UDS positive for benzos Patient was started on IV fluid Admitted to [facility]  Subjective: Patient was seen and examined this morning. Lying on bed.  Not in distress.  Feels better than yesterday on Valium.  Minimal tremors. Hemodynamically stable.  Assessment and plan: Alcohol withdrawal symptoms Chronic alcoholism Reports drinking about four 40 oz bottles per day, last drink was the night of 6/28. He went to [place] house for detox, noted to have tremors, tachycardia and and sent to the ED with concern of alcohol withdrawal. Currently on CIWA protocol with scheduled Valium and as needed Ativan .  Continue  gabapentin .  Completed 2 weeks course of naltrexone  on 6/29, as previously prescribed. Tachycardia, tremors improving Folate, multivitamin and thiamine  TOC consulted for substance use resources  Substance-induced mood disorder Anxiety and depression H/o suicidal ideation Mood disorder mostly due to his alcohol abuse Has had a history of suicidal ideations but denies this currently on my questioning Continue sertraline and PRN hydroxyzine    Tobacco use disorder Reports smoking 1 pack of cigarette/day Has nicotine  patch on, will continue   Homelessness Currently residing at [facility] house Plan to return to [facility] house at discharge tomorrow   Insomnia Continue trazodone  at bedtime   Mobility: Encourage ambulation  Goals of care   Code Status: Full Code     DVT prophylaxis:  enoxaparin  (LOVENOX ) injection 40 mg Start: 08/24/23 1000   Antimicrobials: None Fluid: NS at 100 mL/h Consultants: None Family Communication: None at bedside  Status: Observation Level of care:  Progressive   Patient is from: Homeless Needs to continue in-hospital care: Improving alcohol withdrawal symptoms Anticipated d/c to: Patient states he will go to [facility] house directly from the hospital tomorrow     Diet:  Diet Order             Diet regular Room service appropriate? Yes; Fluid consistency: Thin  Diet effective now                   Scheduled Meds:  diazepam  5 mg Oral Q6H   enoxaparin  (LOVENOX ) injection  40 mg Subcutaneous Q24H   folic acid   1 mg  Oral Daily   gabapentin   300 mg Oral TID   multivitamin with minerals  1 tablet Oral Daily   nicotine   21 mg Transdermal Daily   sertraline  25 mg Oral QHS   thiamine   100 mg Oral Daily   Or   thiamine   100 mg Intravenous Daily   traZODone   100 mg Oral QHS    PRN meds: acetaminophen  **OR** acetaminophen , hydrOXYzine , LORazepam  **OR** LORazepam , ondansetron  **OR** ondansetron  (ZOFRAN ) IV, propranolol , senna-docusate    Infusions:     Antimicrobials: Anti-infectives (From admission, onward)    None       Objective: Vitals:   08/25/23 0442 08/25/23 0928  BP: 117/80 133/87  Pulse: 77 87  Resp: 17 20  Temp: 97.9 F (36.6 C) 97.8 F (36.6 C)  SpO2: 93% 93%    Intake/Output Summary (Last 24 hours) at 08/25/2023 1257 Last data filed at 08/25/2023 1045 Gross per 24 hour  Intake 1813.96 ml  Output 1700 ml  Net 113.96 ml   Filed Weights   08/23/23 1743  Weight: 79.4 kg   Weight change:  Body mass index is 28.25 kg/m.   Physical Exam: General exam: Pleasant, middle-aged.  Not in distress currently Skin: No rashes, lesions or ulcers. HEENT: Atraumatic, normocephalic, no obvious bleeding Lungs: Clear to auscultation bilaterally,  CVS: S1, S2, no murmur,   GI/Abd: Soft, nontender, nondistended, bowel sound present,   CNS: Alert, awake, able to answer orientation questions.  Improving tremors. Psychiatry: Mood appropriate,  Extremities: No pedal edema, no calf tenderness,   Data Review: I have personally reviewed the laboratory data and studies available.  F/u labs ordered Unresulted Labs (From admission, onward)    None       Signed, Chapman Rota, MD [facility] 08/25/2023

## 2023-08-25 NOTE — TOC Initial Note (Signed)
 Transition of Care [facility]) - Initial/Assessment Note    Patient Details  Name: John [name] MRN: [id number] Date of Birth: 1980-10-24  [facility] CM/SW Contact:    Tawni CHRISTELLA Eva, LCSW Phone Number: 08/25/2023, 11:40 AM  Clinical Narrative:                  Substance use resources has been added to pt's AVS.       Patient Goals and CMS Choice            Expected Discharge Plan and Services                                              Prior Living Arrangements/Services                       Activities of Daily Living   ADL Screening (condition at time of admission) Independently performs ADLs?: Yes (appropriate for developmental age) Is the patient deaf or have difficulty hearing?: No Does the patient have difficulty seeing, even when wearing glasses/contacts?: No Does the patient have difficulty concentrating, remembering, or making decisions?: No  Permission Sought/Granted                  Emotional Assessment              Admission diagnosis:  Alcohol withdrawal (HCC) [F10.939] Alcohol withdrawal syndrome without complication (HCC) [F10.930] Patient Active Problem List   Diagnosis Date Noted   Alcohol withdrawal (HCC) 08/23/2023   Tachycardia 08/23/2023   Alcohol use disorder, severe, dependence (HCC) 08/23/2023   MDD (major depressive disorder) 08/05/2023   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 Hx urethroplasty for urethral trauma in 2020 04/30/2019   Alcohol abuse with intoxication (HCC) 04/30/2019   Acute Femoral condyle fracture (HCC) 04/30/2019   Alcohol abuse 01/14/2018   Substance induced mood disorder (HCC) 01/14/2018   PCP:  Freddrick, No Pharmacy:    [facility] [address] [place] [address] Phone: [phone] Fax: [phone]  [facility] [address], [place] - [address] [place] [address] Phone: [phone] Fax: [phone]     Social Drivers of Health (SDOH) Social History: SDOH Screenings   Food Insecurity: Food Insecurity Present (08/24/2023)  Housing: High Risk (08/24/2023)  Transportation Needs: Unmet Transportation Needs (08/24/2023)  Utilities: Not At Risk (08/24/2023)  Alcohol Screen: Low Risk  (09/09/2019)  Depression (PHQ2-9): Medium Risk (08/09/2023)  Financial Resource Strain: High Risk (08/10/2019)  Social Connections: Moderately Isolated (08/10/2019)  Tobacco Use: High Risk (08/24/2023)   SDOH Interventions:     Readmission Risk Interventions     No data to display

## 2023-11-05 ENCOUNTER — Emergency Department (HOSPITAL_COMMUNITY): Payer: MEDICAID

## 2023-11-05 ENCOUNTER — Emergency Department (HOSPITAL_COMMUNITY)
Admission: EM | Admit: 2023-11-05 | Discharge: 2023-11-06 | Disposition: A | Discharge status: Other Acute Inpatient Hospital | Payer: MEDICAID

## 2023-11-05 ENCOUNTER — Other Ambulatory Visit: Payer: Self-pay

## 2023-11-05 ENCOUNTER — Encounter (HOSPITAL_COMMUNITY): Payer: Self-pay

## 2023-11-05 DIAGNOSIS — I10 Essential (primary) hypertension: Secondary | ICD-10-CM | POA: Diagnosis not present

## 2023-11-05 DIAGNOSIS — Y906 Blood alcohol level of 120-199 mg/100 ml: Secondary | ICD-10-CM | POA: Diagnosis not present

## 2023-11-05 DIAGNOSIS — R0602 Shortness of breath: Secondary | ICD-10-CM | POA: Insufficient documentation

## 2023-11-05 DIAGNOSIS — D72829 Elevated white blood cell count, unspecified: Secondary | ICD-10-CM | POA: Diagnosis not present

## 2023-11-05 DIAGNOSIS — F1010 Alcohol abuse, uncomplicated: Secondary | ICD-10-CM

## 2023-11-05 DIAGNOSIS — R45851 Suicidal ideations: Secondary | ICD-10-CM | POA: Insufficient documentation

## 2023-11-05 DIAGNOSIS — F332 Major depressive disorder, recurrent severe without psychotic features: Secondary | ICD-10-CM | POA: Diagnosis not present

## 2023-11-05 DIAGNOSIS — F1020 Alcohol dependence, uncomplicated: Secondary | ICD-10-CM | POA: Insufficient documentation

## 2023-11-05 DIAGNOSIS — F17210 Nicotine dependence, cigarettes, uncomplicated: Secondary | ICD-10-CM | POA: Insufficient documentation

## 2023-11-05 DIAGNOSIS — Z79899 Other long term (current) drug therapy: Secondary | ICD-10-CM | POA: Insufficient documentation

## 2023-11-05 DIAGNOSIS — F10230 Alcohol dependence with withdrawal, uncomplicated: Secondary | ICD-10-CM | POA: Diagnosis present

## 2023-11-05 LAB — COMPREHENSIVE METABOLIC PANEL WITH GFR
ALT: 11 U/L (ref 0–44)
AST: 24 U/L (ref 15–41)
Albumin: 3.8 g/dL (ref 3.5–5.0)
Alkaline Phosphatase: 89 U/L (ref 38–126)
Anion gap: 12 (ref 5–15)
BUN: <5 mg/dL — ABNORMAL LOW (ref 6–20)
CO2: 27 mmol/L (ref 22–32)
Calcium: 9.7 mg/dL (ref 8.9–10.3)
Chloride: 101 mmol/L (ref 98–111)
Creatinine, Ser: 0.59 mg/dL — ABNORMAL LOW (ref 0.61–1.24)
GFR, Estimated: >60 mL/min (ref >60)
Glucose, Bld: 79 mg/dL (ref 70–99)
Potassium: 3.8 mmol/L (ref 3.5–5.1)
Sodium: 139 mmol/L (ref 135–145)
Total Bilirubin: 0.3 mg/dL (ref 0.0–1.2)
Total Protein: 9 g/dL — ABNORMAL HIGH (ref 6.5–8.1)

## 2023-11-05 LAB — URINE DRUG SCREEN
Amphetamines: NEGATIVE
Barbiturates: NEGATIVE
Benzodiazepines: NEGATIVE
Cocaine: NEGATIVE
Fentanyl: NEGATIVE
Methadone Scn, Ur: NEGATIVE
Opiates: NEGATIVE
Tetrahydrocannabinol: NEGATIVE

## 2023-11-05 LAB — CBC WITH DIFFERENTIAL/PLATELET
Abs Immature Granulocytes: 0.11 K/uL — ABNORMAL HIGH (ref 0.00–0.07)
Basophils Absolute: 0.1 K/uL (ref 0.0–0.1)
Basophils Relative: 1 %
Eosinophils Absolute: 0.1 K/uL (ref 0.0–0.5)
Eosinophils Relative: 0 %
HCT: 45.6 % (ref 39.0–52.0)
Hemoglobin: 14.2 g/dL (ref 13.0–17.0)
Immature Granulocytes: 1 %
Lymphocytes Relative: 23 %
Lymphs Abs: 2.8 K/uL (ref 0.7–4.0)
MCH: 29 pg (ref 26.0–34.0)
MCHC: 31.1 g/dL (ref 30.0–36.0)
MCV: 93.1 fL (ref 80.0–100.0)
Monocytes Absolute: 0.8 K/uL (ref 0.1–1.0)
Monocytes Relative: 6 %
Neutro Abs: 8.6 K/uL — ABNORMAL HIGH (ref 1.7–7.7)
Neutrophils Relative %: 69 %
Platelets: 464 K/uL — ABNORMAL HIGH (ref 150–400)
RBC: 4.9 MIL/uL (ref 4.22–5.81)
RDW: 14.5 % (ref 11.5–15.5)
WBC: 12.5 K/uL — ABNORMAL HIGH (ref 4.0–10.5)
nRBC: 0 % (ref 0.0–0.2)

## 2023-11-05 LAB — ETHANOL: Alcohol, Ethyl (B): 192 mg/dL — ABNORMAL HIGH (ref <15)

## 2023-11-05 LAB — TROPONIN T, HIGH SENSITIVITY: Troponin T High Sensitivity: <15 ng/L (ref 0–19)

## 2023-11-05 LAB — ACETAMINOPHEN LEVEL: Acetaminophen (Tylenol), Serum: <10 ug/mL — ABNORMAL LOW (ref 10–30)

## 2023-11-05 LAB — SALICYLATE LEVEL: Salicylate Lvl: <7 mg/dL — ABNORMAL LOW (ref 7.0–30.0)

## 2023-11-05 MED ORDER — LORAZEPAM 1 MG PO TABS: 0.0000 mg | ORAL_TABLET | Freq: Four times a day (QID) | ORAL | Status: DC

## 2023-11-05 MED ORDER — LORAZEPAM 2 MG/ML IJ SOLN: 0.0000 mg | Freq: Two times a day (BID) | INTRAMUSCULAR | Status: DC

## 2023-11-05 MED ORDER — THIAMINE MONONITRATE 100 MG PO TABS
100.0000 mg | ORAL_TABLET | Freq: Every day | ORAL | Status: DC
  Administered 2023-11-06 (×2): 100 mg via ORAL
  Filled 2023-11-05 (×2): qty 1

## 2023-11-05 MED ORDER — LORAZEPAM 2 MG/ML IJ SOLN: 0.0000 mg | Freq: Four times a day (QID) | INTRAMUSCULAR | Status: DC

## 2023-11-05 MED ORDER — LORAZEPAM 1 MG PO TABS: 0.0000 mg | ORAL_TABLET | Freq: Two times a day (BID) | ORAL | Status: DC

## 2023-11-05 MED ORDER — THIAMINE HCL 100 MG/ML IJ SOLN: 100.0000 mg | Freq: Every day | INTRAMUSCULAR | Status: DC

## 2023-11-05 NOTE — ED Notes (Signed)
 Pt cursing staff, demanding food, he was made aware to respect staff and not to use profanity and name calling. He was provided with a sandwich and soda. If pt tries to leave, he will be IVC'd. At present time he agrees to stay.

## 2023-11-05 NOTE — ED Notes (Signed)
 Patient refuses Resp panel.

## 2023-11-05 NOTE — ED Provider Notes (Signed)
 [place] EMERGENCY DEPARTMENT AT [facility] Provider Note   CSN: [id number] Arrival date & time: 11/05/23  1715     Patient presents with: No chief complaint on file.   John [name] is a 43 y.o. male with a past medical history significant for hypertension, alcohol abuse, and polysubstance abuse who presents to the ED due to shortness of breath.  Patient admits to intermittent shortness of breath associated with a cough. No chest pain.  Denies lower extremity edema.  No history of blood clots.  Denies lower extremity edema.  Patient also requesting alcohol and methamphetamine detox.  He notes he last drank today.  Typically drinks 4 to 5 40 ounce beers daily.  Had roughly 4 or 5 of them this morning.  Denies any tremors.  No hallucinations.  Patient also admits to suicidal ideations.  Notes he felt suicidal this morning.  No plan.  Of note, difficult to obtain HPI.  Possibly intoxicated??  History obtained from patient and past medical records. No interpreter used during encounter.       Prior to Admission medications   Medication Sig Start Date End Date Taking? Authorizing Provider  gabapentin  (NEURONTIN ) 300 MG capsule Take 1 capsule (300 mg total) by mouth 3 (three) times daily. 08/09/23   Lynnette Barter, MD  hydrOXYzine  (ATARAX ) 50 MG tablet Take 1 tablet (50 mg total) by mouth 3 (three) times daily as needed for anxiety. 08/09/23   Lynnette Barter, MD  ibuprofen  (ADVIL ) 200 MG tablet Take 200 mg by mouth every 6 (six) hours as needed.    [provider]  naltrexone  (DEPADE) 50 MG tablet Take 1 tablet (50 mg total) by mouth daily. 08/09/23   Lynnette Barter, MD  nicotine  (NICODERM CQ  - DOSED IN MG/24 HOURS) 21 mg/24hr patch Place 1 patch (21 mg total) onto the skin daily. 08/09/23   Lynnette Barter, MD  propranolol  (INDERAL ) 10 MG tablet Take 1 tablet (10 mg total) by mouth 2 (two) times daily as needed. 08/09/23   Lynnette Barter, MD  sertraline  (ZOLOFT ) 25 MG tablet Take 25 mg by mouth at  bedtime.    [provider]  traZODone  (DESYREL ) 100 MG tablet Take 0.5-1 tablets (50-100 mg total) by mouth at bedtime as needed for sleep. Patient taking differently: Take 100 mg by mouth at bedtime. 08/09/23   Lynnette Barter, MD    Allergies: Penicillins    Review of Systems  Constitutional:  Negative for fever.  Respiratory:  Positive for cough and shortness of breath.   Cardiovascular:  Negative for chest pain.  Psychiatric/Behavioral:  Positive for suicidal ideas.     Updated Vital Signs BP 129/84   Pulse 83   Temp 98.4 F (36.9 C) (Oral)   Resp 18   SpO2 98%   Physical Exam Vitals and nursing note reviewed.  Constitutional:      General: He is not in acute distress.    Appearance: He is not ill-appearing.  HENT:     Head: Normocephalic.  Eyes:     Pupils: Pupils are equal, round, and reactive to light.  Cardiovascular:     Rate and Rhythm: Normal rate and regular rhythm.     Pulses: Normal pulses.     Heart sounds: Normal heart sounds. No murmur heard.    No friction rub. No gallop.  Pulmonary:     Effort: Pulmonary effort is normal.     Breath sounds: Normal breath sounds.  Abdominal:     General:  Abdomen is flat. There is no distension.     Palpations: Abdomen is soft.     Tenderness: There is no abdominal tenderness. There is no guarding or rebound.  Musculoskeletal:        General: Normal range of motion.     Cervical back: Neck supple.  Skin:    General: Skin is warm and dry.  Neurological:     General: No focal deficit present.     Mental Status: He is alert.  Psychiatric:        Mood and Affect: Mood normal.        Behavior: Behavior normal.     (all labs ordered are listed, but only abnormal results are displayed) Labs Reviewed  COMPREHENSIVE METABOLIC PANEL WITH GFR - Abnormal; Notable for the following components:      Result Value   BUN <5 (*)    Creatinine, Ser 0.59 (*)    Total Protein 9.0 (*)    All other components within  normal limits  ETHANOL - Abnormal; Notable for the following components:   Alcohol, Ethyl (B) 192 (*)    All other components within normal limits  CBC WITH DIFFERENTIAL/PLATELET - Abnormal; Notable for the following components:   WBC 12.5 (*)    Platelets 464 (*)    Neutro Abs 8.6 (*)    Abs Immature Granulocytes 0.11 (*)    All other components within normal limits  ACETAMINOPHEN  LEVEL - Abnormal; Notable for the following components:   Acetaminophen  (Tylenol ), Serum <10 (*)    All other components within normal limits  SALICYLATE LEVEL - Abnormal; Notable for the following components:   Salicylate Lvl <7.0 (*)    All other components within normal limits  RESP PANEL BY RT-PCR (RSV, FLU A&B, COVID)  [id number]  URINE DRUG SCREEN  TROPONIN T, HIGH SENSITIVITY  TROPONIN T, HIGH SENSITIVITY    EKG: None  Radiology: DG Chest Portable 1 View Result Date: 11/05/2023 CLINICAL DATA:  Shortness of breath. EXAM: PORTABLE CHEST 1 VIEW COMPARISON:  October 13, 2022. FINDINGS: The heart size and mediastinal contours are within normal limits. Stable left basilar scarring. No acute pulmonary disease is noted. Old right clavicular fracture is again noted. IMPRESSION: No active disease. Electronically Signed   By: Lynwood Landy Raddle M.D.   On: 11/05/2023 18:03     Procedures   Medications Ordered in the ED  LORazepam  (ATIVAN ) injection 0-4 mg (has no administration in time range)    Or  LORazepam  (ATIVAN ) tablet 0-4 mg (has no administration in time range)  LORazepam  (ATIVAN ) injection 0-4 mg (has no administration in time range)    Or  LORazepam  (ATIVAN ) tablet 0-4 mg (has no administration in time range)  thiamine  (VITAMIN B1) tablet 100 mg (has no administration in time range)    Or  thiamine  (VITAMIN B1) injection 100 mg (has no administration in time range)    Clinical Course as of 11/05/23 2121  Thu Nov 05, 2023  1837 Alcohol, Ethyl (B)(!): 192 [CA]  1841 Called to bedside by RN.   Patient becoming agitated.  Still admits to suicidal ideations. Patient will need to be IVC'd if attempts to leave.  [CA]    Clinical Course User Index [CA] John Aleck BROCKS, PA-C                                 Medical Decision Making Amount and/or Complexity of Data  Reviewed Independent Historian: EMS Labs: ordered. Decision-making details documented in ED Course. Radiology: ordered and independent interpretation performed. Decision-making details documented in ED Course. ECG/medicine tests: ordered and independent interpretation performed. Decision-making details documented in ED Course.  Risk OTC drugs. Prescription drug management.   This patient presents to the ED for concern of SOB, this involves an extensive number of treatment options, and is a complaint that carries with it a high risk of complications and morbidity.  The differential diagnosis includes PNA, ACS, asthma, PE, etc  43 year old male presents to the ED via EMS requesting methamphetamine and alcohol detoxification.  Drinks 4 to 5 40 ounce beers daily. Drank some beer earlier. Also admits to shortness of breath and suicidal ideations.  Upon arrival, vitals all within normal limits.  Patient in no acute distress.  Reassuring physical exam.  Lungs clear to auscultation bilaterally.  No lower extremity edema.  No evidence of DVT on exam.  Patient possibly intoxicated during initial evaluation. Routine labs ordered.  Chest x-ray given shortness of breath to rule out pneumonia. EKG and troponin. If unremarkable, will discuss with TTS given suicidal ideations.   CBC with leukocytosis at 12.5.  Normal hemoglobin.  CMP reassuring.  Normal LFTs.  Ethanol level elevated at 192.  Acetaminophen  and salicylate levels within normal limits.  Troponin normal.  EKG normal sinus rhythm.  No signs acute ischemia.  Low suspicion for ACS.  Chest X-ray personally viewed interpreted negative for signs of pneumonia, pneumothorax or widened  mediastinum. UDS negative.  Unclear etiology of SOB. No wheeze on exam. Low suspicion for asthma/COPD. Low suspicion for PE/DVT. No evidence of respiratory distress. Patient has been observed in the ED for over 4 hours. No evidence of alcohol withdrawal on exam.  Patient has been medically cleared for TTS evaluation. CIWA protocol ordered.   Hx alcohol abuse/polysubstance abuse No PCP    Final diagnoses:  Shortness of breath  Suicidal ideations  Alcohol abuse    ED Discharge Orders     None          John Aleck JAYSON DEVONNA 11/05/23 2123    Simon Lavonia SAILOR, MD 11/05/23 2356

## 2023-11-05 NOTE — ED Triage Notes (Signed)
 Pt BIB [facility] from street requesting detox from [facility]TOH. Admits to ETOH use today.

## 2023-11-05 NOTE — BH Assessment (Signed)
 TTS Consult will be completed by IRIS Provider. IRIS Coordinator will communicate in established secure chat assessment time and provider name. Thanks

## 2023-11-06 ENCOUNTER — Encounter (HOSPITAL_COMMUNITY): Payer: Self-pay | Admitting: Psychiatry

## 2023-11-06 ENCOUNTER — Inpatient Hospital Stay (HOSPITAL_COMMUNITY)
Admission: AD | Admit: 2023-11-06 | Discharge: 2023-11-10 | DRG: 897 | Disposition: A | Discharge status: Home / Self Care | Payer: MEDICAID | Source: Intra-hospital

## 2023-11-06 DIAGNOSIS — F1020 Alcohol dependence, uncomplicated: Principal | ICD-10-CM | POA: Diagnosis present

## 2023-11-06 DIAGNOSIS — F319 Bipolar disorder, unspecified: Secondary | ICD-10-CM | POA: Diagnosis present

## 2023-11-06 DIAGNOSIS — Z5982 Transportation insecurity: Secondary | ICD-10-CM

## 2023-11-06 DIAGNOSIS — F17210 Nicotine dependence, cigarettes, uncomplicated: Secondary | ICD-10-CM | POA: Diagnosis present

## 2023-11-06 DIAGNOSIS — Z5900 Homelessness unspecified: Secondary | ICD-10-CM | POA: Diagnosis not present

## 2023-11-06 DIAGNOSIS — Z5986 Financial insecurity: Secondary | ICD-10-CM

## 2023-11-06 DIAGNOSIS — Z5941 Food insecurity: Secondary | ICD-10-CM

## 2023-11-06 DIAGNOSIS — Z716 Tobacco abuse counseling: Secondary | ICD-10-CM

## 2023-11-06 DIAGNOSIS — F419 Anxiety disorder, unspecified: Secondary | ICD-10-CM | POA: Diagnosis present

## 2023-11-06 DIAGNOSIS — Z79899 Other long term (current) drug therapy: Secondary | ICD-10-CM | POA: Diagnosis not present

## 2023-11-06 DIAGNOSIS — Z880 Allergy status to penicillin: Secondary | ICD-10-CM | POA: Diagnosis not present

## 2023-11-06 DIAGNOSIS — F1994 Other psychoactive substance use, unspecified with psychoactive substance-induced mood disorder: Secondary | ICD-10-CM | POA: Diagnosis not present

## 2023-11-06 DIAGNOSIS — F332 Major depressive disorder, recurrent severe without psychotic features: Secondary | ICD-10-CM | POA: Diagnosis not present

## 2023-11-06 DIAGNOSIS — R45851 Suicidal ideations: Secondary | ICD-10-CM | POA: Diagnosis present

## 2023-11-06 DIAGNOSIS — R0602 Shortness of breath: Secondary | ICD-10-CM | POA: Diagnosis not present

## 2023-11-06 DIAGNOSIS — F10229 Alcohol dependence with intoxication, unspecified: Secondary | ICD-10-CM | POA: Diagnosis present

## 2023-11-06 DIAGNOSIS — G629 Polyneuropathy, unspecified: Secondary | ICD-10-CM | POA: Diagnosis present

## 2023-11-06 DIAGNOSIS — F1024 Alcohol dependence with alcohol-induced mood disorder: Secondary | ICD-10-CM | POA: Diagnosis present

## 2023-11-06 DIAGNOSIS — I10 Essential (primary) hypertension: Secondary | ICD-10-CM | POA: Diagnosis present

## 2023-11-06 DIAGNOSIS — Z9151 Personal history of suicidal behavior: Secondary | ICD-10-CM

## 2023-11-06 MED ORDER — NICOTINE 14 MG/24HR TD PT24
14.0000 mg | MEDICATED_PATCH | Freq: Every day | TRANSDERMAL | Status: DC
  Administered 2023-11-06 – 2023-11-10 (×5): 14 mg via TRANSDERMAL
  Filled 2023-11-06 (×5): qty 1

## 2023-11-06 MED ORDER — ACETAMINOPHEN 325 MG PO TABS
650.0000 mg | ORAL_TABLET | Freq: Four times a day (QID) | ORAL | Status: DC | PRN
  Administered 2023-11-07 – 2023-11-08 (×2): 650 mg via ORAL
  Filled 2023-11-06 (×2): qty 2

## 2023-11-06 MED ORDER — LORAZEPAM 1 MG PO TABS
0.0000 mg | ORAL_TABLET | Freq: Four times a day (QID) | ORAL | Status: AC
  Administered 2023-11-07: 1 mg via ORAL
  Filled 2023-11-06: qty 1

## 2023-11-06 MED ORDER — LORAZEPAM 2 MG/ML IJ SOLN: 0.0000 mg | Freq: Four times a day (QID) | INTRAMUSCULAR | Status: AC

## 2023-11-06 MED ORDER — DIPHENHYDRAMINE HCL 50 MG/ML IJ SOLN: 50.0000 mg | Freq: Three times a day (TID) | INTRAMUSCULAR | Status: DC | PRN

## 2023-11-06 MED ORDER — HALOPERIDOL LACTATE 5 MG/ML IJ SOLN: 10.0000 mg | Freq: Three times a day (TID) | INTRAMUSCULAR | Status: DC | PRN

## 2023-11-06 MED ORDER — TRAZODONE HCL 100 MG PO TABS
100.0000 mg | ORAL_TABLET | Freq: Every evening | ORAL | Status: DC | PRN
  Administered 2023-11-06 – 2023-11-09 (×4): 100 mg via ORAL
  Filled 2023-11-06 (×4): qty 1

## 2023-11-06 MED ORDER — LORAZEPAM 1 MG PO TABS: 0.0000 mg | ORAL_TABLET | Freq: Two times a day (BID) | ORAL | Status: DC

## 2023-11-06 MED ORDER — HALOPERIDOL 5 MG PO TABS: 5.0000 mg | ORAL_TABLET | Freq: Three times a day (TID) | ORAL | Status: DC | PRN

## 2023-11-06 MED ORDER — HYDROXYZINE HCL 25 MG PO TABS: 50.0000 mg | ORAL_TABLET | Freq: Four times a day (QID) | ORAL | Status: DC | PRN

## 2023-11-06 MED ORDER — LORAZEPAM 2 MG/ML IJ SOLN: 2.0000 mg | Freq: Three times a day (TID) | INTRAMUSCULAR | Status: DC | PRN

## 2023-11-06 MED ORDER — DIAZEPAM 5 MG/ML IJ SOLN: 10.0000 mg | Freq: Four times a day (QID) | INTRAMUSCULAR | Status: DC | PRN

## 2023-11-06 MED ORDER — VITAMIN B-1 100 MG PO TABS
100.0000 mg | ORAL_TABLET | Freq: Every day | ORAL | Status: DC
  Administered 2023-11-07 – 2023-11-08 (×2): 100 mg via ORAL
  Filled 2023-11-06 (×2): qty 1

## 2023-11-06 MED ORDER — DIPHENHYDRAMINE HCL 25 MG PO CAPS: 50.0000 mg | ORAL_CAPSULE | Freq: Three times a day (TID) | ORAL | Status: DC | PRN

## 2023-11-06 MED ORDER — ALUM & MAG HYDROXIDE-SIMETH 200-200-20 MG/5ML PO SUSP
30.0000 mL | ORAL | Status: DC | PRN
  Administered 2023-11-08: 30 mL via ORAL
  Filled 2023-11-06: qty 30

## 2023-11-06 MED ORDER — THIAMINE HCL 100 MG/ML IJ SOLN: 100.0000 mg | Freq: Every day | INTRAMUSCULAR | Status: DC

## 2023-11-06 MED ORDER — LORAZEPAM 2 MG/ML IJ SOLN: 0.0000 mg | Freq: Two times a day (BID) | INTRAMUSCULAR | Status: DC

## 2023-11-06 MED ORDER — ZIPRASIDONE MESYLATE 20 MG IM SOLR: 10.0000 mg | Freq: Four times a day (QID) | INTRAMUSCULAR | Status: DC | PRN

## 2023-11-06 MED ORDER — HALOPERIDOL LACTATE 5 MG/ML IJ SOLN: 5.0000 mg | Freq: Three times a day (TID) | INTRAMUSCULAR | Status: DC | PRN

## 2023-11-06 MED ORDER — MAGNESIUM HYDROXIDE 400 MG/5ML PO SUSP: 30.0000 mL | Freq: Every day | ORAL | Status: DC | PRN

## 2023-11-06 MED ORDER — DIPHENHYDRAMINE HCL 50 MG/ML IJ SOLN: 50.0000 mg | Freq: Four times a day (QID) | INTRAMUSCULAR | Status: DC | PRN

## 2023-11-06 NOTE — Progress Notes (Signed)
 John [name] is a 43 year old male admitted voluntarily from [facility] ED due to [facility] and requesting alcohol/methamphetamine detox.   Initially presented to ED for shortness of breath and noted to be possibly intoxicated also requesting detox. Patient's last drink was yesterday with BAL 192. Typically drinks 4 to 5 40 ounce beers daily. UDS negative but admits to also smoking weed and doing meth all the time. Patient states being homeless and having no support or anyone to call. Patient noted to have abrasions on his face and states falling while drunk before. Patient does have 2 or 3 past suicidal attempts with the last one being in 2018 by cutting his wrist. Patient states he is looking for resources for substance abuse treatment or housing. Patient states he struggles financially and with transportation but does get disability checks. Patient oriented to unit/unit rules. Patient verbalized all understanding. Patient currently denies SI,HI, and A/V/H with no plan or intent. Patient remains cooperative in unit and proceeded to go shower before dinner.   BP 125/81 (BP Location: Left Arm)   Pulse 92   Temp 98.3 F (36.8 C) (Oral)   Resp 18   Ht 5' 6 (1.676 m)   Wt 70 kg   SpO2 97%   BMI 24.92 kg/m

## 2023-11-06 NOTE — Plan of Care (Signed)
  Problem: Education: Goal: Verbalization of understanding the information provided will improve Outcome: Progressing   Problem: Health Behavior/Discharge Planning: Goal: Compliance with treatment plan for underlying cause of condition will improve Outcome: Progressing   Problem: Safety: Goal: Periods of time without injury will increase Outcome: Progressing   

## 2023-11-06 NOTE — Progress Notes (Signed)
   11/06/23 2113  Psych Admission Type (Psych Patients Only)  Admission Status Voluntary  Psychosocial Assessment  Patient Complaints Substance abuse  Eye Contact Fair  Facial Expression Animated  Affect Anxious  Speech Logical/coherent  Interaction Assertive  Motor Activity Slow  Appearance/Hygiene In scrubs  Behavior Characteristics Cooperative  Mood Anxious  Thought Process  Coherency WDL  Content WDL  Delusions None reported or observed  Perception WDL  Hallucination None reported or observed  Judgment Poor  Confusion None  Danger to Self  Current suicidal ideation? Denies  Danger to Others  Danger to Others None reported or observed

## 2023-11-06 NOTE — Plan of Care (Signed)
   Problem: Education: Goal: Emotional status will improve Outcome: Not Progressing Goal: Mental status will improve Outcome: Not Progressing

## 2023-11-06 NOTE — Consult Note (Signed)
 [facility] Consult Note  Patient Name: John [name] MRN: [id number] DOB: December 25, 1980 DATE OF Consult: 11/06/2023  PRIMARY PSYCHIATRIC DIAGNOSES   1.  Major Depression, Recurrent, Severe without Psychotic Symptoms 2.  Alcohol Use Disorder, Severe   RECOMMENDATIONS  Recommendations: Medication recommendations: PRN's:  Hydroxyzine , 50 mg q6h PRN anxiety;  Geodon , 10 mg IM q6h PRN and Valium , 10 mg IM q6h PRN and Benadryl , 50 mg IM q6h PRN, all for severe agitation/aggression.  Patient continues on a CIWA protocol via the ED. Non-Medication/therapeutic recommendations: Patient still feels suicidal, even when now sober, and he will require close observation until he is admitted to a psychiatric service.  Continue with matter-of-fact emotional support in ED, pending transfer Is inpatient psychiatric hospitalization recommended for this patient? Yes (Explain why): Patient still states that he is suicidal, even when now sober.  Has attempted suicide in the past (via cutting), and his younger brother hanged himself at age 65, with strong family history of bipolar disorder. Extended discussion with patient about unit rule, e.g., no smoking, locked doors, attendance required for group activities, etc., and he agreed to comply, given his degree of suicidality. Is another care setting recommended for this patient? (examples may include Crisis Stabilization Unit, Residential/Recovery Treatment, ALF/SNF, Memory Care Unit)  No (Explain why): As above From a psychiatric perspective, is this patient appropriate for discharge to an outpatient setting/resource or other less restrictive environment for continued care?  No (Explain why): As above Follow-Up Telepsychiatry C/L services: We will sign off for now. Please re-consult our service if needed for any concerning changes in the patient's condition, discharge planning, or questions. Communication: Treatment team members (and family members if  applicable) who were involved in treatment/care discussions and planning, and with whom we spoke or engaged with via secure text/chat, include the following: Secure message sent to Dr. Bari, ED attending, and staff, outlining above recommenations  Thank you for involving us  in the care of this patient. If you have any additional questions or concerns, please call [phone] and ask for the provider on-call.   TELEPSYCHIATRY ATTESTATION & CONSENT   As the provider for this telehealth consult, I attest that I verified the patient's identity using two separate identifiers, introduced myself to the patient, provided my credentials, disclosed my location, and performed this encounter via a HIPAA-compliant, real-time, face-to-face, two-way, interactive audio and video platform and with the full consent and agreement of the patient (or guardian as applicable.)   Patient physical location: [name]. Telehealth provider physical location: home office in state of [place] .  Video start time: 0200h EDT  Video end time: 0215h EDT   Total time spent in this encounter was 30 minutes, including record review, clinical interview, behavior observations, discussion of impressions and recommendations (including medications and hospitalization), and consultation/communication with relevant parties   IDENTIFYING DATA  John [name] is a 43 y.o. year-old male for whom a psychiatric consultation has been ordered by the primary provider. The patient was identified using two separate identifiers.  CHIEF COMPLAINT/REASON FOR CONSULT   I just don't care anymore.  I've got nobody.  I just might as well die and get it over with.   HISTORY OF PRESENT ILLNESS (HPI)  The patient presents with longstanding history of mood disturbance and polysubstance use, with long history of detox/rehab programs that have not been successful to date.  Has past history of suicide attempt (in 2018) by cutting.  One past psych  hospitalization a long time ago.  Patient's younger brother (only sib) hanged himself at age 47.  Patient has been homeless, and in the past has struggled to comply with requirements for housing and sober living.  Has been on the streets for a while now, and he came in with BAL of  192 (UDS negative).  Patient came in stating that he was wanting to kill himself because I have nothing to live for.  After patient sobered up, he still stated that he is thinking of killing himself because I have nothing.  Given his past history of issues with compliance re treatment, spent time with him discussing the requirements that he would need to agree to in order to participate in inpatient treatment, and he agreed to do them.  There's nothing else for me.    Denies psychotic sx's, and no evidence of acute delirium.  No homicidal ideation.   PAST PSYCHIATRIC HISTORY   Otherwise as per HPI above.  PAST MEDICAL HISTORY  Past Medical History:  Diagnosis Date   ASD (atrial septal defect)    Bacterial infection due to H. pylori    Hypertension    Kidney stones      HOME MEDICATIONS  Facility Ordered Medications  Medication   LORazepam  (ATIVAN ) injection 0-4 mg   Or   LORazepam  (ATIVAN ) tablet 0-4 mg   [START ON 11/08/2023] LORazepam  (ATIVAN ) injection 0-4 mg   Or   [START ON 11/08/2023] LORazepam  (ATIVAN ) tablet 0-4 mg   thiamine  (VITAMIN B1) tablet 100 mg   Or   thiamine  (VITAMIN B1) injection 100 mg   hydrOXYzine  (ATARAX ) tablet 50 mg   ziprasidone  (GEODON ) injection 10 mg   diazepam  (VALIUM ) injection 10 mg   diphenhydrAMINE  (BENADRYL ) injection 50 mg   PTA Medications  Medication Sig   propranolol  (INDERAL ) 10 MG tablet Take 1 tablet (10 mg total) by mouth 2 (two) times daily as needed.   hydrOXYzine  (ATARAX ) 50 MG tablet Take 1 tablet (50 mg total) by mouth 3 (three) times daily as needed for anxiety.   nicotine  (NICODERM CQ  - DOSED IN MG/24 HOURS) 21 mg/24hr patch Place 1 patch (21 mg  total) onto the skin daily.   traZODone  (DESYREL ) 100 MG tablet Take 0.5-1 tablets (50-100 mg total) by mouth at bedtime as needed for sleep. (Patient taking differently: Take 100 mg by mouth at bedtime.)   naltrexone  (DEPADE) 50 MG tablet Take 1 tablet (50 mg total) by mouth daily.   gabapentin  (NEURONTIN ) 300 MG capsule Take 1 capsule (300 mg total) by mouth 3 (three) times daily.   sertraline  (ZOLOFT ) 25 MG tablet Take 25 mg by mouth at bedtime.   ibuprofen  (ADVIL ) 200 MG tablet Take 200 mg by mouth every 6 (six) hours as needed.     ALLERGIES  Allergies  Allergen Reactions   Penicillins     Did it involve swelling of the face/tongue/throat, SOB, or low BP? No Did it involve sudden or severe rash/hives, skin peeling, or any reaction on the inside of your mouth or nose? No Did you need to seek medical attention at a hospital or doctor's office? Unknown When did it last happen?       If all above answers are "NO", may proceed with cephalosporin use.    SOCIAL & SUBSTANCE USE HISTORY  Social History   Socioeconomic History   Marital status: Single    Spouse name: Not on file   Number of children: Not on file   Years of education: Not on file  Highest education level: Not on file  Occupational History   Not on file  Tobacco Use   Smoking status: Every Day    Types: Cigarettes    Passive exposure: Past   Smokeless tobacco: Never   Tobacco comments:    down to 2 cigarettes a day  Vaping Use   Vaping status: Never Used  Substance and Sexual Activity   Alcohol use: Yes    Comment: Last consumed Friday 06/10/19, pt states he is an alcoholic and wants to drink   Drug use: Not Currently   Sexual activity: Yes    Birth control/protection: None  Other Topics Concern   Not on file  Social History Narrative   Not on file   Social Drivers of Health   Financial Resource Strain: High Risk (08/10/2019)   Overall Financial Resource Strain (CARDIA)    Difficulty of Paying Living  Expenses: Very hard  Food Insecurity: Food Insecurity Present (08/24/2023)   Hunger Vital Sign    Worried About Running Out of Food in the Last Year: Sometimes true    Ran Out of Food in the Last Year: Sometimes true  Transportation Needs: Unmet Transportation Needs (08/24/2023)   PRAPARE - Administrator, Civil Service (Medical): Yes    Lack of Transportation (Non-Medical): Yes  Physical Activity: Not on file  Stress: Not on file  Social Connections: Moderately Isolated (08/10/2019)   Social Connection and Isolation Panel    Frequency of Communication with Friends and Family: Never    Frequency of Social Gatherings with Friends and Family: Never    Attends Religious Services: 1 to 4 times per year    Active Member of [employer] or Organizations: Yes    Attends Banker Meetings: 1 to 4 times per year    Marital Status: Never married   Social History   Tobacco Use  Smoking Status Every Day   Types: Cigarettes   Passive exposure: Past  Smokeless Tobacco Never  Tobacco Comments   down to 2 cigarettes a day   Social History   Substance and Sexual Activity  Alcohol Use Yes   Comment: Last consumed Friday 06/10/19, pt states he is an alcoholic and wants to drink   Social History   Substance and Sexual Activity  Drug Use Not Currently    Additional pertinent information  Has 28 yo son who lives with patient's father.  Has rare contact with him. SABRA  FAMILY HISTORY  History reviewed. No pertinent family history. Family Psychiatric History (if known):  Strong history of bipolar disorder in family, and brother committed suicide at age 42.   MENTAL STATUS EXAM (MSE)  Mental Status Exam: General Appearance: Fairly Groomed  Orientation:  Full (Time, Place, and Person)  Memory:  Immediate;   Fair Recent;   Fair Remote;   Fair  Concentration:  Concentration: Fair and Attention Span: Fair  Recall:  Fair  Attention  Fair  Eye Contact:  Fair  Speech:  Clear  and Coherent and Normal Rate  Language:  Good  Volume:  Normal  Mood: I've got nothing to live for.  Affect:  Depressed and Flat  Thought Process:  Coherent  Thought Content:  Rumination  Suicidal Thoughts:  Yes.  with intent/plan  Homicidal Thoughts:  No  Judgement:  Impaired  Insight:  Shallow  Psychomotor Activity:  Decreased  Akathisia:  NA  Fund of Knowledge:  Fair    Assets:  Manufacturing systems engineer Desire for Improvement  Cognition:  WNL  ADL's:  Intact  AIMS (if indicated):       VITALS  Blood pressure 129/84, pulse 83, temperature 98.4 F (36.9 C), temperature source Oral, resp. rate 18, SpO2 98%.  LABS  Admission on 11/05/2023  Component Date Value Ref Range Status   Sodium 11/05/2023 139  135 - 145 mmol/L Final   Potassium 11/05/2023 3.8  3.5 - 5.1 mmol/L Final   Chloride 11/05/2023 101  98 - 111 mmol/L Final   CO2 11/05/2023 27  22 - 32 mmol/L Final   Glucose, Bld 11/05/2023 79  70 - 99 mg/dL Final   Glucose reference range applies only to samples taken after fasting for at least 8 hours.   BUN 11/05/2023 <5 (L)  6 - 20 mg/dL Final   Creatinine, Ser 11/05/2023 0.59 (L)  0.61 - 1.24 mg/dL Final   Calcium  11/05/2023 9.7  8.9 - 10.3 mg/dL Final   Total Protein 90/88/7974 9.0 (H)  6.5 - 8.1 g/dL Final   Albumin 90/88/7974 3.8  3.5 - 5.0 g/dL Final   AST 90/88/7974 24  15 - 41 U/L Final   ALT 11/05/2023 11  0 - 44 U/L Final   Alkaline Phosphatase 11/05/2023 89  38 - 126 U/L Final   Total Bilirubin 11/05/2023 0.3  0.0 - 1.2 mg/dL Final   GFR, Estimated 11/05/2023 >60  >60 mL/min Final   Comment: (NOTE) Calculated using the CKD-EPI Creatinine Equation (2021)    Anion gap 11/05/2023 12  5 - 15 Final   Performed at [facility], [address], [place], [place] [address]   Alcohol, Ethyl (B) 11/05/2023 192 (H)  <15 mg/dL Final   Comment: (NOTE) For medical purposes only. Performed at [facility], [address], [place], [place] [address]    Opiates 11/05/2023 NEGATIVE  NEGATIVE Final   Cocaine 11/05/2023 NEGATIVE  NEGATIVE Final   Benzodiazepines 11/05/2023 NEGATIVE  NEGATIVE Final   Amphetamines 11/05/2023 NEGATIVE  NEGATIVE Final   Tetrahydrocannabinol 11/05/2023 NEGATIVE  NEGATIVE Final   Barbiturates 11/05/2023 NEGATIVE  NEGATIVE Final   Methadone Scn, Ur 11/05/2023 NEGATIVE  NEGATIVE Final   Fentanyl 11/05/2023 NEGATIVE  NEGATIVE Final   Comment: (NOTE) Drug screen is for Medical Purposes only. Positive results are preliminary only. If confirmation is needed, notify lab within 5 days.  Drug Class                 Cutoff (ng/mL) Amphetamine and metabolites 1000 Barbiturate and metabolites 200 Benzodiazepine              200 Opiates and metabolites     300 Cocaine and metabolites     300 THC                         50 Fentanyl                    5 Methadone                   300  Trazodone  is metabolized in vivo to several metabolites,  including pharmacologically active m-CPP, which is excreted in the  urine.  Immunoassay screens for amphetamines and MDMA have potential  cross-reactivity with these compounds and may provide false positive  result.  Performed at [facility], [address], [place], [place] [address]    WBC 11/05/2023 12.5 (H)  4.0 - 10.5 K/uL Final   RBC  11/05/2023 4.90  4.22 - 5.81 MIL/uL Final   Hemoglobin 11/05/2023 14.2  13.0 - 17.0 g/dL Final   HCT 90/88/7974 45.6  39.0 - 52.0 % Final   MCV 11/05/2023 93.1  80.0 - 100.0 fL Final   MCH 11/05/2023 29.0  26.0 - 34.0 pg Final   MCHC 11/05/2023 31.1  30.0 - 36.0 g/dL Final   RDW 90/88/7974 14.5  11.5 - 15.5 % Final   Platelets 11/05/2023 464 (H)  150 - 400 K/uL Final   nRBC 11/05/2023 0.0  0.0 - 0.2 % Final   Neutrophils Relative % 11/05/2023 69  % Final   Neutro Abs 11/05/2023 8.6 (H)  1.7 - 7.7 K/uL Final   Lymphocytes Relative 11/05/2023 23  % Final   Lymphs Abs 11/05/2023 2.8  0.7  - 4.0 K/uL Final   Monocytes Relative 11/05/2023 6  % Final   Monocytes Absolute 11/05/2023 0.8  0.1 - 1.0 K/uL Final   Eosinophils Relative 11/05/2023 0  % Final   Eosinophils Absolute 11/05/2023 0.1  0.0 - 0.5 K/uL Final   Basophils Relative 11/05/2023 1  % Final   Basophils Absolute 11/05/2023 0.1  0.0 - 0.1 K/uL Final   Immature Granulocytes 11/05/2023 1  % Final   Abs Immature Granulocytes 11/05/2023 0.11 (H)  0.00 - 0.07 K/uL Final   Performed at [facility], [address], [place], [place] [address]   Acetaminophen  (Tylenol ), Serum 11/05/2023 <10 (L)  10 - 30 ug/mL Final   Comment: (NOTE) Toxic concentrations can be more effectively related to post dose interval; > 200, > 100, and > 50 ug/mL serum concentrations correspond to toxic concentrations at 4, 8, and 12 hours post dose, respectively.  Performed at [facility], [address], [place], [place] [address]    Salicylate Lvl 11/05/2023 <7.0 (L)  7.0 - 30.0 mg/dL Final   Performed at [facility], [address], [place], [place] [address]   Troponin T High Sensitivity 11/05/2023 <15  0 - 19 ng/L Final   Comment: (NOTE) Biotin concentrations > 1000 ng/mL falsely decrease TnT results.  Serial cardiac troponin measurements are suggested.  Refer to the Links section for chest pain algorithms and additional  guidance. Performed at [facility], [address] [place], [place] [address]     PSYCHIATRIC REVIEW OF SYSTEMS (ROS)  ROS: Notable for the following relevant positive findings: Review of Systems  Constitutional:  Positive for malaise/fatigue.  HENT: Negative.    Eyes: Negative.   Respiratory: Negative.    Cardiovascular: Negative.   Gastrointestinal: Negative.   Genitourinary: Negative.   Musculoskeletal: Negative.   Skin: Negative.   Neurological:  Positive for tremors.  Endo/Heme/Allergies: Negative.   Psychiatric/Behavioral:   Positive for depression, substance abuse and suicidal ideas. The patient is nervous/anxious.     Additional findings:      Musculoskeletal: No abnormal movements observed      Gait & Station: Normal      Pain Screening: Denies      Nutrition & Dental Concerns: Reviewed   RISK FORMULATION/ASSESSMENT  Is the patient experiencing any suicidal or homicidal ideations: Yes       Explain if yes: Patient continuing with suicidal thoughts, again of cutting himself.  Protective factors considered for safety management:   Patient has no social support in the community  Risk factors/concerns considered for safety management:  Prior attempt Family history of suicide Depression Substance abuse/dependence Access to lethal means  Hopelessness Impulsivity Aggression Isolation Barriers to accessing treatment Male gender Unmarried  Is there a safety management plan with the patient and treatment team to minimize risk factors and promote protective factors: No           Explain: Patient has no support in the community  Is crisis care placement or psychiatric hospitalization recommended: Yes     Based on my current evaluation and risk assessment, patient is determined at this time to be at:  High risk  *RISK ASSESSMENT Risk assessment is a dynamic process; it is possible that this patient's condition, and risk level, may change. This should be re-evaluated and managed over time as appropriate. Please re-consult psychiatric consult services if additional assistance is needed in terms of risk assessment and management. If your team decides to discharge this patient, please advise the patient how to best access emergency psychiatric services, or to call 911, if their condition worsens or they feel unsafe in any way.   Adriana JINNY Pontes, MD Telepsychiatry Consult Services

## 2023-11-06 NOTE — Group Note (Signed)
 Date:  11/06/2023 Time:  4:39 PM  Group Topic/Focus:  Overcoming Stress:   The focus of this group is to define stress and help patients assess their triggers.    Participation Level:  Did Not Attend    Annalee Larch 11/06/2023, 4:39 PM

## 2023-11-06 NOTE — ED Notes (Signed)
Report called to [facility] 

## 2023-11-06 NOTE — Tx Team (Signed)
 Initial Treatment Plan 11/06/2023 4:53 PM RANDE ROYLANCE [id number]    PATIENT STRESSORS: Financial difficulties   Medication change or noncompliance   Substance abuse     PATIENT STRENGTHS: Ability for insight  Active sense of humor    PATIENT IDENTIFIED PROBLEMS: Polysubstance abuse  Depression                   DISCHARGE CRITERIA:  Improved stabilization in mood, thinking, and/or behavior Verbal commitment to aftercare and medication compliance Withdrawal symptoms are absent or subacute and managed without 24-hour nursing intervention  PRELIMINARY DISCHARGE PLAN: Attend 12-step recovery group Participate in family therapy  PATIENT/FAMILY INVOLVEMENT: This treatment plan has been presented to and reviewed with the patient, John [name].The patient has been given the opportunity to ask questions and make suggestions.  Jarris Kortz, RN 11/06/2023, 4:53 PM

## 2023-11-06 NOTE — Progress Notes (Signed)
 Pt has been accepted to [facility] on 11/06/2023. Bed assignment: 400-1   Pt meets inpatient criteria per Adriana Pontes, MD   Attending Physician will be Dr. Raliegh  Report can be called to: -Adult unit: [phone]  Pt can arrive after 1PM   Care Team Notified: [facility], RN, Ester Sloop, Paramedic, Chesley Holt

## 2023-11-06 NOTE — Progress Notes (Signed)
 Nursing Progress Note:   (Sleep Hours) -  (Any PRNs that were needed, meds refused, or side effects to meds)- Trazodone  PRN  (Any disturbances and when (visitation, over night)- None  (Concerns raised by the patient)- Homeless; Detox program; Housing   (SI/HI/AVH)- Denies

## 2023-11-06 NOTE — Group Note (Signed)
 Date:  11/06/2023 Time:  10:51 PM  Group Topic/Focus:  Wrap-Up Group:   The focus of this group is to help patients review their daily goal of treatment and discuss progress on daily workbooks.    Participation Level:  Did Not Attend  Participation Quality:  Did not attend group this evening   Affect:  Resistant  Cognitive:  Lacking  Insight: Lacking  Engagement in Group:  None  Modes of Intervention:  Discussion  Additional Comments:  Patient did not attend group this evening   Bari Moats 11/06/2023, 10:51 PM

## 2023-11-07 MED ORDER — GABAPENTIN 300 MG PO CAPS
300.0000 mg | ORAL_CAPSULE | Freq: Three times a day (TID) | ORAL | Status: DC
  Administered 2023-11-07 – 2023-11-10 (×9): 300 mg via ORAL
  Filled 2023-11-07 (×10): qty 1

## 2023-11-07 MED ORDER — NALTREXONE HCL 50 MG PO TABS
50.0000 mg | ORAL_TABLET | Freq: Every day | ORAL | Status: DC
  Administered 2023-11-08 – 2023-11-10 (×3): 50 mg via ORAL
  Filled 2023-11-07 (×3): qty 1

## 2023-11-07 MED ORDER — PROPRANOLOL HCL 10 MG PO TABS: 10.0000 mg | ORAL_TABLET | Freq: Two times a day (BID) | ORAL | Status: DC | PRN

## 2023-11-07 NOTE — Group Note (Signed)
 Date:  11/07/2023 Time:  10:58 AM  Group Topic/Focus:  Safety Plan This group focused on helping patients identify personal safety goals and strategies to support emotional and physical well-being. Patients discussed the meaning of safety, explored warning signs, coping skills, and sources of support, and were encouraged to share individualized safety goals. The group emphasized self-awareness, personal strengths, and the importance of proactive planning in maintaining safety and supporting recovery.    Participation Level:  Did Not Attend  Participation Quality:  N/A  Affect:  N/A  Cognitive:  N/A  Insight: None  Engagement in Group:  None  Modes of Intervention:  N/A  Additional Comments:  Pt did not attend this safety plan group.  Kristi HERO Amilia Vandenbrink 11/07/2023, 10:58 AM

## 2023-11-07 NOTE — Group Note (Signed)
 Date:  11/07/2023 Time:  4:49 PM  Group Topic/Focus:  Self Care:   The focus of this group is to help patients understand the importance of self-care and sleep hygiene in order to improve or restore emotional, physical, spiritual, interpersonal, and financial health.    Participation Level:  Minimal  Participation Quality:  Appropriate and Attentive  Affect:  Appropriate  Cognitive:  Alert, Appropriate, and Oriented  Insight: Improving  Engagement in Group:  Developing/Improving  Modes of Intervention:  Discussion   John [name] 11/07/2023, 4:49 PM

## 2023-11-07 NOTE — [facility] Group Notes (Signed)
 LCSW Wellness Group Note   11/07/2023 11:00am  Type of Group and Topic: Psychoeducational Group:  Wellness  Participation Level:  did not attend  Description of Group  Wellness group introduces the topic and its focus on developing healthy habits across the spectrum and its relationship to a decrease in hospital admissions.  Six areas of wellness are discussed: physical, social spiritual, intellectual, occupational, and emotional.  Patients are asked to consider their current wellness habits and to identify areas of wellness where they are interested and able to focus on improvements.    Therapeutic Goals Patients will understand components of wellness and how they can positively impact overall health.  Patients will identify areas of wellness where they have developed good habits. Patients will identify areas of wellness where they would like to make improvements.    Summary of Patient Progress     Therapeutic Modalities: Cognitive Behavioral Therapy Psychoeducation    Bridget Cordella Simmonds, LCSW

## 2023-11-07 NOTE — Plan of Care (Signed)
   Problem: Education: Goal: Knowledge of [place] [facility] Education information/materials will improve Outcome: Progressing Goal: Emotional status will improve Outcome: Progressing Goal: Mental status will improve Outcome: Progressing

## 2023-11-07 NOTE — [facility] Suicide Risk Assessment (Signed)
 [facility] Admission Suicide Risk Assessment   Nursing information obtained from:  Patient Demographic factors:  Male, Caucasian, Low socioeconomic status, Unemployed Current Mental Status:  Suicidal ideation indicated by patient Loss Factors:  Financial problems / change in socioeconomic status Historical Factors:  Prior suicide attempts Risk Reduction Factors:  NA  Total Time spent with patient:: 1 Hour Principal Problem: Alcohol use disorder, severe, dependence (HCC) Diagnosis:  Principal Problem:   Alcohol use disorder, severe, dependence (HCC) Active Problems:   Substance induced mood disorder (HCC)   Subjective Data:  John [name] is a 43 y.o., male with history of alcohol use disorder, bipolar disorder, MDD who is admitted to [facility] for alcohol intoxication and suicidal ideation. He has a history of 1 suicide attempt via self-harm of his wrist and 1 psychiatric hospitalization. He was admitted for alcohol detoxification and crisis stabilization.    Patient's predominant concern is that he has been unable to control his alcohol use especially since he became homeless again. He was staying at a place of residence but found that a lot of individuals are using substances so opted to go elsewhere.  He reports that he had wanted to go to sober living community after his [facility] hospitalization in June but never made it to an [facility].  He was open to going to residential rehab for treatment of his alcohol use disorder.  He reportedly had history of bipolar disorder due to some symptoms of hyperverbal speech, tangentiality, and inappropriate comments but these do not appear to be episodic and alcohol intoxication has previously played a role in these behaviors. He endorses some symptoms of depression including depressed mood, hopelessness, poor sleep, poor appetite, anhedonia but he attributes most of this due to housing instability and alcohol use disorder.  He denies symptoms of psychosis.  He  denies present SI/HI/AVH.  Continued Clinical Symptoms:  Alcohol Use Disorder Identification Test Final Score (AUDIT): 13 The Alcohol Use Disorders Identification Test, Guidelines for Use in Primary Care, Second Edition.  World Science writer [facility]). Score between 0-7:  no or low risk or alcohol related problems. Score between 8-15:  moderate risk of alcohol related problems. Score between 16-19:  high risk of alcohol related problems. Score 20 or above:  warrants further diagnostic evaluation for alcohol dependence and treatment.   CLINICAL FACTORS:   Severe Anxiety and/or Agitation More than one psychiatric diagnosis Previous Psychiatric Diagnoses and Treatments   Musculoskeletal: Strength & Muscle Tone: within normal limits Gait & Station: normal Patient leans: N/A  Psychiatric Specialty Exam:  Presentation  General Appearance:  Casual   Eye Contact: Fair   Speech: Clear and Coherent   Speech Volume: Normal   Handedness: Right   Mood and Affect  Mood: Euthymic   Affect: Congruent    Thought Process  Thought Processes: Coherent   Descriptions of Associations:Intact   Orientation:Full (Time, Place and Person)   Thought Content:Logical   History of Schizophrenia/Schizoaffective disorder:No   Duration of Psychotic Symptoms:No data recorded  Hallucinations:No data recorded  Ideas of Reference:None   Suicidal Thoughts:No data recorded  Homicidal Thoughts:No data recorded   Sensorium  Memory: Immediate Fair   Judgment: Good   Insight: Good    Executive Functions  Concentration: Good   Attention Span: Good   Recall: Good   Fund of Knowledge: Good   Language: Good    Psychomotor Activity  Psychomotor Activity:No data recorded   Assets  Assets: Communication Skills; Desire for Improvement; Physical Health  Sleep  Sleep:No data recorded      COGNITIVE FEATURES THAT CONTRIBUTE TO  RISK:  None    SUICIDE RISK:   Minimal: No identifiable suicidal ideation.  Patients presenting with no risk factors but with morbid ruminations; may be classified as minimal risk based on the severity of the depressive symptoms  PLAN OF CARE: see H&P  I certify that inpatient services furnished can reasonably be expected to improve the patient's condition.   Prentice Espy, MD 11/07/2023, 1:30 PM

## 2023-11-07 NOTE — Group Note (Signed)
 Date:  11/07/2023 Time:  10:43 AM  Group Topic/Focus:  Goals Group:   The focus of this group is to help patients establish daily goals to achieve during treatment and discuss how the patient can incorporate goal setting into their daily lives to aide in recovery.    Participation Level:  Did Not Attend  Participation Quality:  N/A  Affect:  N/A  Cognitive:  N/A  Insight: None  Engagement in Group:  None  Modes of Intervention:  N/A  Additional Comments:  Pt did not attend goals group.  Kristi HERO Zahriah Roes 11/07/2023, 10:43 AM

## 2023-11-07 NOTE — Group Note (Signed)
 Date:  11/07/2023 Time:  9:54 PM  Group Topic/Focus:  Wrap-Up Group:   The focus of this group is to help patients review their daily goal of treatment and discuss progress on daily workbooks.    Participation Level:  Active  Participation Quality:  Appropriate, Attentive, and Sharing  Affect:  Appropriate  Cognitive:  Appropriate  Insight: Appropriate  Engagement in Group:  Engaged and Supportive  Modes of Intervention:  Discussion and Support  Additional Comments:  Patient appeared actively engaged in wrap up group. He described himself as kind despite challenges with confrontational people. He is proud that he was able to get through the day. He looks forward to talking with the social work to receive support. He strives to continue working on tx goals.   Dena JINNY Mace 11/07/2023, 9:54 PM

## 2023-11-07 NOTE — H&P (Addendum)
 Psychiatric Admission Assessment Adult  Patient Identification: John [name] MRN:  [id number] Date of Evaluation:  11/07/2023  Chief Complaint:  Alcohol use disorder, severe, dependence (HCC) [F10.20],  Alcohol use disorder, severe, dependence (HCC)  Principal Problem:   Alcohol use disorder, severe, dependence (HCC) Active Problems:   Substance induced mood disorder (HCC)   History of Present Illness:  John [name] is a 43 y.o., male with history of alcohol use disorder, substance-induced mood disorder and homelessness who is admitted to [facility] for alcohol intoxication and suicidal ideation. He has a history of 1 suicide attempt via self-harm of his wrist and 1 psychiatric hospitalization. He was admitted for alcohol detoxification and crisis stabilization.   Patient's predominant concern is that he has been unable to control his alcohol use especially since he became homeless again. He was staying at a place of residence but found that a lot of individuals are using substances so opted to go elsewhere.  He reports that he had wanted to go to sober living community after his [facility] hospitalization in June but never made it to an [facility].  He was open to going to residential rehab for treatment of his alcohol use disorder.  He reportedly had history of bipolar disorder due to some symptoms of hyperverbal speech, tangentiality, and inappropriate comments but these do not appear to be episodic and alcohol intoxication has previously played a role in these behaviors. He endorses some symptoms of depression including depressed mood, hopelessness, poor sleep, poor appetite, anhedonia but he attributes most of this due to housing instability and alcohol use disorder.  He denies symptoms of psychosis.  He denies present SI/HI/AVH.   Past Psychiatric History:  He reports a past psychiatric history of MDD, bipolar disorder, alcohol use disorder. He reports he had a past psychiatric hospitalization in  [place] over 3 years ago and that was when he was diagnosed with bipolar disorder. He reports he was admitted during a similar situation for a suicide attempt by cutting his wrist and alcohol use. He reports he was prescribed zoloft  by his PCP [facility]) for depression but he has not been taking it. He denies having a therapist. Per chart review, patient has also previously been prescribed depakote  500 BID, zyprexa  2.5 at bedtime. Patient also had psychiatric hospitalization in 08/2019 at [facility], he was admitted for worsening depression mixed bipolar traits and multiple admits for alcohol intoxication and withdrawals, not able to contract for safety. At that time he was discharged on depakote  1000mg  BID, gabapentin  600 TID, naltrexone  50, nortriptyline  25 at bedtime, zyprexa  15mg  at bedtime and discharged to [facility]. Per chart review, patient left his detox bed at [facility] 2 months ago after being dissatisfied with his treatment and inability to smoke.  [facility], he was on gabapentin , and Treximet, and propranolol  which appeared to have been an effective treatment for him but he has been off psychotropics for a few months now.  Substance Use History: Alcohol:  Is much as I can get my hands on Tobacco: Endorses Illicit Substance: Denies Cannabis: Denies    Is the patient at risk to self? No Has the patient been a risk to self in the past 6 months? No Has the patient been a risk to self within the distant past? No Is the patient a risk to others? No Has the patient been a risk to others in the past 6 months? No  Has the patient been a risk to others within the distant past? No  Alcohol Screening: 1. How often do you have a drink containing alcohol?: 2 to 3 times a week 2. How many drinks containing alcohol do you have on a typical day when you are drinking?: 3 or 4 3. How often do you have six or more drinks on one  occasion?: Less than monthly AUDIT-C Score: 5 4. How often during the last year have you found that you were not able to stop drinking once you had started?: Never 5. How often during the last year have you failed to do what was normally expected from you because of drinking?: Never 6. How often during the last year have you needed a first drink in the morning to get yourself going after a heavy drinking session?: Never 7. How often during the last year have you had a feeling of guilt of remorse after drinking?: Weekly 8. How often during the last year have you been unable to remember what happened the night before because you had been drinking?: Less than monthly 9. Have you or someone else been injured as a result of your drinking?: No 10. Has a relative or friend or a doctor or another health worker been concerned about your drinking or suggested you cut down?: Yes, during the last year Alcohol Use Disorder Identification Test Final Score (AUDIT): 13 Alcohol Brief Interventions/Follow-up: Alcohol education/Brief advice Tobacco Screening:    Substance Abuse History in the last 12 months: Yes  Allergies:  Allergies  Allergen Reactions   Penicillins Other (See Comments)    Unknown     Past Medical/Surgical History:  Past Medical History:  Diagnosis Date   ASD (atrial septal defect)    Bacterial infection due to H. pylori    Hypertension    Kidney stones   Denies history of seizures or delirium tremens  Family History:  History reviewed. No pertinent family history.  Social History:  Social History   Socioeconomic History   Marital status: Single    Spouse name: Not on file   Number of children: Not on file   Years of education: Not on file   Highest education level: Not on file  Occupational History   Not on file  Tobacco Use   Smoking status: Every Day    Types: Cigarettes    Passive exposure: Past   Smokeless tobacco: Never   Tobacco comments:    down to 2  cigarettes a day  Vaping Use   Vaping status: Never Used  Substance and Sexual Activity   Alcohol use: Yes    Comment: Last consumed Friday 06/10/19, pt states he is an alcoholic and wants to drink   Drug use: Yes    Types: Methamphetamines, Marijuana   Sexual activity: Yes    Birth control/protection: None  Other Topics Concern   Not on file  Social History Narrative   Not on file   Social Drivers of Health   Financial Resource Strain: High Risk (08/10/2019)   Overall Financial Resource Strain (CARDIA)    Difficulty of Paying Living Expenses: Very hard  Food Insecurity: Food Insecurity Present (11/06/2023)   Hunger Vital Sign    Worried About Running Out of Food in the Last Year: Sometimes true    Ran Out of Food in the Last Year: Sometimes true  Transportation Needs: Unmet Transportation Needs (11/06/2023)   PRAPARE - Transportation    Lack of Transportation (Medical): Yes    Lack  of Transportation (Non-Medical): Yes  Physical Activity: Not on file  Stress: Not on file  Social Connections: Moderately Isolated (08/10/2019)   Social Connection and Isolation Panel    Frequency of Communication with Friends and Family: Never    Frequency of Social Gatherings with Friends and Family: Never    Attends Religious Services: 1 to 4 times per year    Active Member of [employer] or Organizations: Yes    Attends Banker Meetings: 1 to 4 times per year    Marital Status: Never married  Intimate Partner Violence: Not At Risk (11/06/2023)   Humiliation, Afraid, Rape, and Kick questionnaire    Fear of Current or Ex-Partner: No    Emotionally Abused: No    Physically Abused: No    Sexually Abused: No    Lab Results:  Results for orders placed or performed during the hospital encounter of 11/05/23 (from the past 48 hours)  Urine rapid drug screen (hosp performed)     Status: None   Collection Time: 11/05/23  5:45 PM  Result Value Ref Range   Opiates NEGATIVE NEGATIVE    Cocaine NEGATIVE NEGATIVE   Benzodiazepines NEGATIVE NEGATIVE   Amphetamines NEGATIVE NEGATIVE   Tetrahydrocannabinol NEGATIVE NEGATIVE   Barbiturates NEGATIVE NEGATIVE   Methadone Scn, Ur NEGATIVE NEGATIVE   Fentanyl  NEGATIVE NEGATIVE    Comment: (NOTE) Drug screen is for Medical Purposes only. Positive results are preliminary only. If confirmation is needed, notify lab within 5 days.  Drug Class                 Cutoff (ng/mL) Amphetamine and metabolites 1000 Barbiturate and metabolites 200 Benzodiazepine              200 Opiates and metabolites     300 Cocaine and metabolites     300 THC                         50 Fentanyl                     5 Methadone                   300  Trazodone  is metabolized in vivo to several metabolites,  including pharmacologically active m-CPP, which is excreted in the  urine.  Immunoassay screens for amphetamines and MDMA have potential  cross-reactivity with these compounds and may provide false positive  result.  Performed at [facility], [address] [place], [place] [address]   Comprehensive metabolic panel     Status: Abnormal   Collection Time: 11/05/23  5:47 PM  Result Value Ref Range   Sodium 139 135 - 145 mmol/L   Potassium 3.8 3.5 - 5.1 mmol/L   Chloride 101 98 - 111 mmol/L   CO2 27 22 - 32 mmol/L   Glucose, Bld 79 70 - 99 mg/dL    Comment: Glucose reference range applies only to samples taken after fasting for at least 8 hours.   BUN <5 (L) 6 - 20 mg/dL   Creatinine, Ser 9.40 (L) 0.61 - 1.24 mg/dL   Calcium  9.7 8.9 - 10.3 mg/dL   Total Protein 9.0 (H) 6.5 - 8.1 g/dL   Albumin 3.8 3.5 - 5.0 g/dL   AST 24 15 - 41 U/L   ALT 11 0 - 44 U/L   Alkaline Phosphatase 89 38 - 126 U/L   Total Bilirubin 0.3 0.0 - 1.2  mg/dL   GFR, Estimated >39 >39 mL/min    Comment: (NOTE) Calculated using the CKD-EPI Creatinine Equation (2021)    Anion gap 12 5 - 15    Comment: Performed at [facility],  [address], [place], [place] [address]  Ethanol     Status: Abnormal   Collection Time: 11/05/23  5:47 PM  Result Value Ref Range   Alcohol, Ethyl (B) 192 (H) <15 mg/dL    Comment: (NOTE) For medical purposes only. Performed at [facility], [address] [place], [place] [address]   CBC with Diff     Status: Abnormal   Collection Time: 11/05/23  5:47 PM  Result Value Ref Range   WBC 12.5 (H) 4.0 - 10.5 K/uL   RBC 4.90 4.22 - 5.81 MIL/uL   Hemoglobin 14.2 13.0 - 17.0 g/dL   HCT 54.3 60.9 - 47.9 %   MCV 93.1 80.0 - 100.0 fL   MCH 29.0 26.0 - 34.0 pg   MCHC 31.1 30.0 - 36.0 g/dL   RDW 85.4 88.4 - 84.4 %   Platelets 464 (H) 150 - 400 K/uL   nRBC 0.0 0.0 - 0.2 %   Neutrophils Relative % 69 %   Neutro Abs 8.6 (H) 1.7 - 7.7 K/uL   Lymphocytes Relative 23 %   Lymphs Abs 2.8 0.7 - 4.0 K/uL   Monocytes Relative 6 %   Monocytes Absolute 0.8 0.1 - 1.0 K/uL   Eosinophils Relative 0 %   Eosinophils Absolute 0.1 0.0 - 0.5 K/uL   Basophils Relative 1 %   Basophils Absolute 0.1 0.0 - 0.1 K/uL   Immature Granulocytes 1 %   Abs Immature Granulocytes 0.11 (H) 0.00 - 0.07 K/uL    Comment: Performed at [facility], [address] [place], [place] [address]  Acetaminophen  level     Status: Abnormal   Collection Time: 11/05/23  5:47 PM  Result Value Ref Range   Acetaminophen  (Tylenol ), Serum <10 (L) 10 - 30 ug/mL    Comment: (NOTE) Toxic concentrations can be more effectively related to post dose interval; > 200, > 100, and > 50 ug/mL serum concentrations correspond to toxic concentrations at 4, 8, and 12 hours post dose, respectively.  Performed at [facility], [address] [place], [place] [address]   Salicylate level     Status: Abnormal   Collection Time: 11/05/23  5:47 PM  Result Value Ref Range   Salicylate Lvl <7.0 (L) 7.0 - 30.0 mg/dL    Comment: Performed at [facility], [address] [place], [place] [address]  Troponin T, High Sensitivity     Status: None   Collection Time: 11/05/23  5:47 PM  Result Value Ref Range   Troponin T High Sensitivity <15 0 - 19 ng/L    Comment: (NOTE) Biotin concentrations > 1000 ng/mL falsely decrease TnT results.  Serial cardiac troponin measurements are suggested.  Refer to the Links section for chest pain algorithms and additional  guidance. Performed at [facility], [address] [place], [place] [address]     Blood Alcohol level:  Lab Results  Component Value Date   ETH 192 (H) 11/05/2023   ETH 35 (H) 08/23/2023    Metabolic Disorder Labs:  Lab Results  Component Value Date   HGBA1C 5.2 08/07/2023   MPG 102.54 08/07/2023   No results found for: PROLACTIN Lab Results  Component Value Date  CHOL 278 (H) 08/07/2023   TRIG 125 08/07/2023   HDL 56 08/07/2023   CHOLHDL 5.0 08/07/2023   VLDL 25 08/07/2023   LDLCALC 197 (H) 08/07/2023    Current Medications: Current Facility-Administered Medications  Medication Dose Route Frequency Provider Last Rate Last Admin   acetaminophen  (TYLENOL ) tablet 650 mg  650 mg Oral Q6H PRN Weber, Kyra A, NP   650 mg at 11/07/23 0826   alum & mag hydroxide-simeth (MAALOX/MYLANTA) 200-200-20 MG/5ML suspension 30 mL  30 mL Oral Q4H PRN Weber, Kyra A, NP       haloperidol  (HALDOL ) tablet 5 mg  5 mg Oral TID PRN Weber, Kyra A, NP       And   diphenhydrAMINE  (BENADRYL ) capsule 50 mg  50 mg Oral TID PRN Weber, Kyra A, NP       haloperidol  lactate (HALDOL ) injection 5 mg  5 mg Intramuscular TID PRN Weber, Kyra A, NP       And   diphenhydrAMINE  (BENADRYL ) injection 50 mg  50 mg Intramuscular TID PRN Weber, Kyra A, NP       And   LORazepam  (ATIVAN ) injection 2 mg  2 mg Intramuscular TID PRN Weber, Kyra A, NP       haloperidol  lactate (HALDOL ) injection 10 mg  10 mg Intramuscular TID PRN Weber, Kyra A, NP       And   diphenhydrAMINE  (BENADRYL ) injection 50 mg  50 mg  Intramuscular TID PRN Weber, Kyra A, NP       And   LORazepam  (ATIVAN ) injection 2 mg  2 mg Intramuscular TID PRN Weber, Kyra A, NP       gabapentin  (NEURONTIN ) capsule 300 mg  300 mg Oral Q8H Lynnette Barter, MD       LORazepam  (ATIVAN ) injection 0-4 mg  0-4 mg Intravenous Q6H Weber, Kyra A, NP       Or   LORazepam  (ATIVAN ) tablet 0-4 mg  0-4 mg Oral Q6H Weber, Kyra A, NP   1 mg at 11/07/23 1133   [START ON 11/08/2023] LORazepam  (ATIVAN ) injection 0-4 mg  0-4 mg Intravenous Q12H Weber, Kyra A, NP       Or   [START ON 11/08/2023] LORazepam  (ATIVAN ) tablet 0-4 mg  0-4 mg Oral Q12H Weber, Kyra A, NP       magnesium  hydroxide (MILK OF MAGNESIA) suspension 30 mL  30 mL Oral Daily PRN Weber, Kyra A, NP       [START ON 11/08/2023] naltrexone  (DEPADE) tablet 50 mg  50 mg Oral Daily Lynnette Barter, MD       nicotine  (NICODERM CQ  - dosed in mg/24 hours) patch 14 mg  14 mg Transdermal Daily Pashayan, Alexander S, DO   14 mg at 11/07/23 0825   propranolol  (INDERAL ) tablet 10 mg  10 mg Oral BID PRN Lynnette Barter, MD       thiamine  (Vitamin B-1) tablet 100 mg  100 mg Oral Daily Weber, Kyra A, NP   100 mg at 11/07/23 0825   Or   thiamine  (VITAMIN B1) injection 100 mg  100 mg Intravenous Daily Weber, Kyra A, NP       traZODone  (DESYREL ) tablet 100 mg  100 mg Oral QHS PRN Weber, Kyra A, NP   100 mg at 11/06/23 2113    PTA Medications: Medications Prior to Admission  Medication Sig Dispense Refill Last Dose/Taking   ATIVAN  1 MG tablet Take 1 mg by mouth 2 (two) times daily as needed. (Patient not  taking: Reported on 11/06/2023)      cloNIDine (CATAPRES) 0.1 MG tablet Take 0.1 mg by mouth 2 (two) times daily. (Patient not taking: Reported on 11/06/2023)      diazepam  (VALIUM ) 5 MG tablet Take 5 mg by mouth daily. (Patient not taking: Reported on 11/06/2023)      escitalopram (LEXAPRO) 5 MG tablet Take 5 mg by mouth daily. (Patient not taking: Reported on 11/06/2023)      famotidine  (PEPCID ) 20 MG tablet Take 20 mg by mouth  daily. (Patient not taking: Reported on 11/06/2023)      folic acid  (FOLVITE ) 1 MG tablet Take 1 mg by mouth daily. (Patient not taking: Reported on 11/06/2023)      gabapentin  (NEURONTIN ) 300 MG capsule Take 1 capsule (300 mg total) by mouth 3 (three) times daily. (Patient not taking: Reported on 11/06/2023) 90 capsule 0    hydrOXYzine  (ATARAX ) 50 MG tablet Take 1 tablet (50 mg total) by mouth 3 (three) times daily as needed for anxiety. (Patient not taking: Reported on 11/06/2023) 60 tablet 00    naltrexone  (DEPADE) 50 MG tablet Take 1 tablet (50 mg total) by mouth daily. 30 tablet 0    nicotine  (NICODERM CQ  - DOSED IN MG/24 HOURS) 21 mg/24hr patch Place 1 patch (21 mg total) onto the skin daily. (Patient not taking: Reported on 11/06/2023) 28 patch 0    propranolol  (INDERAL ) 10 MG tablet Take 1 tablet (10 mg total) by mouth 2 (two) times daily as needed. (Patient not taking: Reported on 11/06/2023) 60 tablet 0    sertraline  (ZOLOFT ) 25 MG tablet Take 25 mg by mouth at bedtime. (Patient not taking: Reported on 11/06/2023)      traZODone  (DESYREL ) 100 MG tablet Take 0.5-1 tablets (50-100 mg total) by mouth at bedtime as needed for sleep. (Patient not taking: Reported on 11/06/2023) 30 tablet 0     Physical Findings: AIMS: No  CIWA:  CIWA-Ar Total: 5 COWS:     Psychiatric Specialty Exam: General Appearance:  Casual   Eye Contact:  Fair   Speech:  Clear and Coherent   Volume:  Normal   Mood:  Euthymic   Affect:  Congruent   Thought Content:  Logical   Suicidal Thoughts: No data recorded  Homicidal Thoughts: No data recorded  Thought Process:  Coherent   Orientation:  Full (Time, Place and Person)     Memory:  Immediate Fair   Judgment:  Good   Insight:  Good   Concentration:  Good   Recall:  Good   Fund of Knowledge:  Good   Language:  Good   Psychomotor Activity: No data recorded  Assets:  Communication Skills; Desire for Improvement; Physical Health    Sleep: No data recorded   Review of Systems Review of Systems  Respiratory:  Negative for shortness of breath.   Cardiovascular:  Negative for chest pain.  Gastrointestinal:  Negative for abdominal pain, constipation, diarrhea, heartburn, nausea and vomiting.  Neurological:  Negative for headaches.    Vital signs: Blood pressure (!) 140/86, pulse 89, temperature 98 F (36.7 C), temperature source Oral, resp. rate 18, height 5' 6 (1.676 m), weight 70 kg, SpO2 97%. Body mass index is 24.92 kg/m. Physical Exam Constitutional:      Appearance: Normal appearance.  HENT:     Head: Normocephalic and atraumatic.  Eyes:     Extraocular Movements: Extraocular movements intact.     Conjunctiva/sclera: Conjunctivae normal.     Pupils: Pupils are equal,  round, and reactive to light.  Cardiovascular:     Rate and Rhythm: Normal rate.     Pulses: Normal pulses.  Pulmonary:     Effort: Pulmonary effort is normal.  Musculoskeletal:        General: Normal range of motion.     Cervical back: Normal range of motion.  Skin:    General: Skin is warm and dry.  Neurological:     General: No focal deficit present.     Mental Status: He is alert and oriented to person, place, and time. Mental status is at baseline.     Assets  Assets:Communication Skills; Desire for Improvement; Physical Health   Treatment Plan Summary: Daily contact with patient to assess and evaluate symptoms and progress in treatment and medication management  ASSESSMENT: Patient currently meets criteria for alcohol use disorder severe dependence and alcohol-induced mood disorder.  Patient has had difficulty with homelessness as well which is likely contributing to her ongoing depressive symptoms and alcohol use.  Plan for patient to go to residential rehab if able or at the minimal to go to a sober living community.  Restarting gabapentin  as this helped with both his neuropathy and anxiety.  Also plan to restart  propranolol  as needed to aid with symptoms of social anxiety.   PLAN: Safety and Monitoring:  -- Voluntary admission to inpatient psychiatric unit for safety, stabilization and treatment  -- Daily contact with patient to assess and evaluate symptoms and progress in treatment  -- Patient's case to be discussed in multi-disciplinary team meeting  -- Observation Level : q15 minute checks  -- Vital signs: q12 hours  -- Precautions: suicide, elopement, and assault  2. Psychiatric Problems #Alcohol use disorder, severe dependence #Substance-induced mood disorder Last alcoholic drink 11/05/23 - CIWA with symptom triggered Ativan  - MVI/thiamine  - Gabapentin  300 mg 3 times daily - Naltrexone  50 mg daily - Nicotine  patch/gum for NRT - Propranolol  10 mg twice daily as needed for social anxiety  -PRNs: Tylenol  maalox, milk of magnesia, hydroxyzine , trazodone  -- As needed agitation protocol in-place  The risks/benefits/side-effects/alternatives to the above medication were discussed in detail with the patient and time was given for questions. The patient consents to medication trial. FDA black box warnings, if present, were discussed.  The patient is agreeable with the medication plan, as above. We will monitor the patient's response to pharmacologic treatment, and adjust medications as necessary.  3. Medical Problems none  4. Routine and other pertinent labs: EKG monitoring: QTc: 457  Metabolism / endocrine: BMI: Body mass index is 24.92 kg/m. Prolactin: No results found for: PROLACTIN Lipid Panel: Lab Results  Component Value Date   CHOL 278 (H) 08/07/2023   TRIG 125 08/07/2023   HDL 56 08/07/2023   CHOLHDL 5.0 08/07/2023   VLDL 25 08/07/2023   LDLCALC 197 (H) 08/07/2023   HbgA1c: Hgb A1c MFr Bld (%)  Date Value  08/07/2023 5.2   TSH: TSH (uIU/mL)  Date Value  08/07/2023 3.271    5. Group Therapy:  -- Encouraged patient to participate in unit milieu and in  scheduled group therapies   -- Short Term Goals: Ability to identify changes in lifestyle to reduce recurrence of condition, verbalize feelings, identify and develop effective coping behaviors, maintain clinical measurements within normal limits, and identify triggers associated with substance abuse/mental health issues will improve. Improvement in ability to demonstrate self-control and comply with prescribed medications.  -- Long Term Goals: Improvement in symptoms so as ready for discharge --  Patient is encouraged to participate in group therapy while admitted to the psychiatric unit. -- We will address other chronic and acute stressors, which contributed to the patient's Alcohol use disorder, severe, dependence (HCC) in order to reduce the risk of self-harm at discharge.  6. Discharge Planning:   -- Social work and case management to assist with discharge planning and identification of hospital follow-up needs prior to discharge  -- Estimated LOS: 5-7 days  -- Discharge Concerns: Need to establish a safety plan; Medication compliance and effectiveness  -- Discharge Goals: Return home with outpatient referrals for mental health follow-up including medication management/psychotherapy  I certify that inpatient services furnished can reasonably be expected to improve the patient's condition.  Signed: Prentice Espy, MD 11/07/2023, 12:29 PM

## 2023-11-07 NOTE — Progress Notes (Signed)
   11/07/23 1500  Psych Admission Type (Psych Patients Only)  Admission Status Voluntary/72 hour document signed  Date 72 hour document signed  11/07/23  Time 72 hour document signed  1500  Provider Notified (First and Last Name) (see details for LINK to note) Marsa rosser

## 2023-11-07 NOTE — Progress Notes (Signed)
   11/07/23 0857  Charting Type  Charting Type Shift assessment  Safety Check Verification  Has the RN verified the 15 minute safety check completion? Yes  Neurological  Neuro (WDL) WDL  HEENT  HEENT (WDL) X  Teeth Missing (Comment)  Respiratory  Respiratory (WDL) WDL  Cardiac  Cardiac (WDL) WDL  Vascular  Vascular (WDL) WDL  Integumentary  Integumentary (WDL) X (No changes)  Braden Scale (Ages 8 and up)  Sensory Perceptions 4  Moisture 4  Activity 4  Mobility 4  Nutrition 3  Friction and Shear 3  Braden Scale Score 22  Musculoskeletal  Musculoskeletal (WDL) WDL  Gastrointestinal  Gastrointestinal (WDL) WDL  Last BM Date  11/05/23  GU Assessment  Genitourinary (WDL) WDL  Neurological  Level of Consciousness Alert

## 2023-11-07 NOTE — Plan of Care (Signed)
   Problem: Education: Goal: Emotional status will improve Outcome: Progressing

## 2023-11-07 NOTE — Progress Notes (Signed)
   11/07/23 0800  Psych Admission Type (Psych Patients Only)  Admission Status Voluntary  Psychosocial Assessment  Patient Complaints None  Eye Contact Fair  Facial Expression Flat  Affect Appropriate to circumstance  Speech Logical/coherent  Interaction Superficial  Motor Activity Slow  Appearance/Hygiene In scrubs  Behavior Characteristics Cooperative  Mood Pleasant  Thought Process  Coherency WDL  Content WDL  Delusions None reported or observed  Perception WDL  Hallucination None reported or observed  Judgment Poor  Confusion None  Danger to Self  Current suicidal ideation? Denies  Agreement Not to Harm Self Yes  Description of Agreement verbal  Danger to Others  Danger to Others None reported or observed

## 2023-11-08 MED ORDER — VITAMIN B-1 100 MG PO TABS
100.0000 mg | ORAL_TABLET | Freq: Every day | ORAL | Status: DC
  Administered 2023-11-09 – 2023-11-10 (×2): 100 mg via ORAL
  Filled 2023-11-08 (×2): qty 1

## 2023-11-08 NOTE — [facility] Group Notes (Signed)
 Adult Psychoeducational Group Note  Date:  11/08/2023 Time:  8:34 PM  Group Topic/Focus:  Wrap-Up Group:   The focus of this group is to help patients review their daily goal of treatment and discuss progress on daily workbooks.  Participation Level:  Active  Participation Quality:  Appropriate  Affect:  Appropriate  Cognitive:  Appropriate  Insight: Appropriate  Engagement in Group:  Engaged  Modes of Intervention:  Discussion  Additional Comments:  Lem said his day was 8.His goal was to be nice and he met goal.Coping skills being around positive people . Favorite part of the day going outside.he likes being outside.  Lang Drilling Long 11/08/2023, 8:34 PM

## 2023-11-08 NOTE — Group Note (Signed)
 Date:  11/08/2023 Time:  12:00 PM  Group Topic/Focus:  Goals Group:   The focus of this group is to help patients establish daily goals to achieve during treatment and discuss how the patient can incorporate goal setting into their daily lives to aide in recovery.    Participation Level:  Pt Did Not Attend  Participation Quality:  Pt Did Not Attend  Affect:  Pt Did Not Attend  Cognitive:  Pt Did Not Attend  Insight: Pt Did Not Attend  Engagement in Group:  Pt Did Not Attend  Modes of Intervention:  Pt Did Not Attend  Additional Comments:  Pt Did Not Attend  Trek Kimball E Tobenna Needs 11/08/2023, 12:00 PM

## 2023-11-08 NOTE — Plan of Care (Signed)

## 2023-11-08 NOTE — Progress Notes (Signed)
 [facility] MD Progress Note  11/08/2023 11:11 AM John [name]  MRN:  [id number]  Principal Problem: Alcohol use disorder, severe, dependence (HCC) Diagnosis: Principal Problem:   Alcohol use disorder, severe, dependence (HCC) Active Problems:   Substance induced mood disorder (HCC)   Reason for Admission:  John [name] is a 43 y.o., male with history of alcohol use disorder, substance-induced mood disorder and homelessness who is admitted to [facility] for alcohol intoxication and suicidal ideation. He has a history of 1 suicide attempt via self-harm of his wrist and 1 psychiatric hospitalization. He was admitted for alcohol detoxification and crisis stabilization (admitted on 11/06/2023, total  LOS: 2 days ).   ASSESSMENT: Patient signed 72 hour yesterday as he cites his son's 18th birthday was Monday but then after discussion decided to rescind it because his concern of housing. He would like to ensure he would be able to attend residential rehab as it appears homelessness is what he attributes to why he drinks so significantly. Denies side effects from naltrexone , propranolol  or gabapentin .   PLAN: #Alcohol use disorder, severe dependence #Substance-induced mood disorder Last alcoholic drink 11/05/23 - CIWA with symptom triggered Ativan , last ciwa=2 - MVI/thiamine  - Gabapentin  300 mg 3 times daily - Naltrexone  50 mg daily - Nicotine  patch/gum for NRT - Propranolol  10 mg twice daily as needed for social anxiety - trazodone  100 mg at bedtime prn for sleep   -PRNs: Tylenol  maalox, milk of magnesia, hydroxyzine , trazodone  -- As needed agitation protocol in-place   Disposition Planning: -- Estimated LOS: 3-5 days --Estimated Discharge Date: 9/17 --Barriers to Discharge: alcohol withdrawal -- Discharge Goals: Return home with outpatient referrals for mental health follow-up including medication management/psychotherapy  Subjective: The patient was seen and evaluated on the unit. On  assessment today the patient reports eating and sleeping well.  He reports signing the 72-hour request for discharge as he states that he has to go to his son's 18th birthday.  Denies SI/HI/AVH.  However, he then reports he wants to rescind the 72-hour as we discussed we would not be able to secure him a residential rehab to be able to go to if he were to opt to discharge on Tuesday.  He was amenable to staying and monitoring for alcohol withdrawal as well as working towards getting to residential rehab.  He denies acute somatic complaints from restarting psychotropics.   Objective: Chart Review from last 24 hours:  The patient's chart was reviewed and nursing notes were reviewed. The patient's case was discussed in multidisciplinary team meeting.  - Overnight events to report per chart review / staff report: none - Patient took all prescribed medications yes - Patient received the following PRNs: trazodone   Current Medications: Current Facility-Administered Medications  Medication Dose Route Frequency Provider Last Rate Last Admin   acetaminophen  (TYLENOL ) tablet 650 mg  650 mg Oral Q6H PRN Weber, Kyra A, NP   650 mg at 11/07/23 0826   alum & mag hydroxide-simeth (MAALOX/MYLANTA) 200-200-20 MG/5ML suspension 30 mL  30 mL Oral Q4H PRN Weber, Kyra A, NP       haloperidol  (HALDOL ) tablet 5 mg  5 mg Oral TID PRN Weber, Kyra A, NP       And   diphenhydrAMINE  (BENADRYL ) capsule 50 mg  50 mg Oral TID PRN Weber, Kyra A, NP       haloperidol  lactate (HALDOL ) injection 5 mg  5 mg Intramuscular TID PRN Weber, Kyra A, NP       And  diphenhydrAMINE  (BENADRYL ) injection 50 mg  50 mg Intramuscular TID PRN Weber, Kyra A, NP       And   LORazepam  (ATIVAN ) injection 2 mg  2 mg Intramuscular TID PRN Weber, Kyra A, NP       haloperidol  lactate (HALDOL ) injection 10 mg  10 mg Intramuscular TID PRN Weber, Kyra A, NP       And   diphenhydrAMINE  (BENADRYL ) injection 50 mg  50 mg Intramuscular TID PRN Weber, Kyra  A, NP       And   LORazepam  (ATIVAN ) injection 2 mg  2 mg Intramuscular TID PRN Weber, Kyra A, NP       gabapentin  (NEURONTIN ) capsule 300 mg  300 mg Oral Q8H Ruthellen Tippy, MD   300 mg at 11/08/23 0730   magnesium  hydroxide (MILK OF MAGNESIA) suspension 30 mL  30 mL Oral Daily PRN Weber, Kyra A, NP       naltrexone  (DEPADE) tablet 50 mg  50 mg Oral Daily Latoya Maulding, MD   50 mg at 11/08/23 0844   nicotine  (NICODERM CQ  - dosed in mg/24 hours) patch 14 mg  14 mg Transdermal Daily Pashayan, Alexander S, DO   14 mg at 11/08/23 9155   propranolol  (INDERAL ) tablet 10 mg  10 mg Oral BID PRN Lynnette Barter, MD       thiamine  (Vitamin B-1) tablet 100 mg  100 mg Oral Daily Weber, Kyra A, NP   100 mg at 11/08/23 9155   Or   thiamine  (VITAMIN B1) injection 100 mg  100 mg Intravenous Daily Weber, Kyra A, NP       traZODone  (DESYREL ) tablet 100 mg  100 mg Oral QHS PRN Weber, Kyra A, NP   100 mg at 11/07/23 2107    Lab Results: No results found for this or any previous visit (from the past 48 hours).  Blood Alcohol level:  Lab Results  Component Value Date   ETH 192 (H) 11/05/2023   ETH 35 (H) 08/23/2023    Metabolic Labs: Lab Results  Component Value Date   HGBA1C 5.2 08/07/2023   MPG 102.54 08/07/2023   No results found for: PROLACTIN Lab Results  Component Value Date   CHOL 278 (H) 08/07/2023   TRIG 125 08/07/2023   HDL 56 08/07/2023   CHOLHDL 5.0 08/07/2023   VLDL 25 08/07/2023   LDLCALC 197 (H) 08/07/2023    Physical Findings: CIWA:  CIWA-Ar Total: 2  Psychiatric Specialty Exam: General Appearance: Casual   Eye Contact: Fair   Speech: Clear and Coherent   Volume: Normal   Mood: denies  Affect: Congruent   Thought Content: Logical   Suicidal Thoughts: denies  Homicidal Thoughts: denies  Thought Process: Coherent   Orientation: Full (Time, Place and Person)     Memory: Immediate Fair   Judgment: poor  Insight: fair  Concentration: Good   Recall: Good   Fund of  Knowledge: Good   Language: Good   Psychomotor Activity: wnl  Assets: Communication Skills; Desire for Improvement; Physical Health   Sleep: No data recorded   Review of Systems ROS  Vital Signs: Blood pressure 112/87, pulse (!) 101, temperature 98 F (36.7 C), resp. rate 12, height 5' 6 (1.676 m), weight 70 kg, SpO2 97%. Body mass index is 24.92 kg/m. Physical Exam  I certify that inpatient services furnished can reasonably be expected to improve the patient's condition.   Signed: Barter Lynnette, MD 11/08/2023, 11:11 AM

## 2023-11-08 NOTE — Progress Notes (Signed)
 Proctor approach nurse's station requesting medications and type medication he can not take. Levorn hanging at the nurse's station. Writer explain 3 different times his nurse is in a report. Writer explain I can not discuss medication because I know nothing about medicine that is a nurse task. Quention began use profanity and storm away from nurse's station. RN notify.

## 2023-11-08 NOTE — Progress Notes (Signed)
(  Sleep Hours) -7.75 (Any PRNs that were needed, meds refused, or side effects to meds)- prn trazodone  @ 2107 (Any disturbances and when (visitation, over night)-none (Concerns raised by the patient)- none (SI/HI/AVH)- denies all

## 2023-11-08 NOTE — Group Note (Signed)
 Date:  11/08/2023 Time:  5:50 PM  Group Topic/Focus:  Coping With Mental Health Crisis:   The purpose of this group is to help patients identify strategies for coping with mental health crisis.  Group discusses possible causes of crisis and ways to manage them effectively. Identifying Needs:   The focus of this group is to help patients identify their personal needs that have been historically problematic and identify healthy behaviors to address their needs. Wellness Toolbox:   The focus of this group is to discuss various aspects of wellness, balancing those aspects and exploring ways to increase the ability to experience wellness.  Patients will create a wellness toolbox for use upon discharge.    Participation Level:  Active  Participation Quality:  Appropriate  Affect:  Appropriate  Cognitive:  Appropriate  Insight: Appropriate  Engagement in Group:  Engaged  Modes of Intervention:  Discussion  Additional Comments:    John [name] 11/08/2023, 5:50 PM

## 2023-11-08 NOTE — Progress Notes (Signed)
 PSA 1st attempt  CSW attempted to complete PSA, Consents and ROI 11/08/2023 @1020 .  CSW explained her role, pt declines at this time to speak with a Child psychotherapist. CSW made a note on [facility] handoff for weekday CSW to followup with pt on another day.   Allura Doepke LCSWA 9.14.25

## 2023-11-08 NOTE — Progress Notes (Addendum)
 D: Patient is alert, oriented, pleasant, and cooperative. Denies SI, HI, AVH, and verbally contracts for safety. Patient reports he slept fair last night with sleeping medication. Patient reports his appetite as fair, energy level as low, and concentration as good. Patient rates his depression 9/10, hopelessness 7/10, and anxiety 9/10. Patient reports low back pain and headache.    A: Scheduled medications administered per MD order. PRN tylenol  administered. Support provided. Patient educated on safety on the unit and medications. Routine safety checks every 15 minutes. Patient stated understanding to tell nurse about any new physical symptoms. Patient understands to tell staff of any needs.     R: No adverse drug reactions noted. Patient remains safe at this time and will continue to monitor.    11/08/23 1000  Psych Admission Type (Psych Patients Only)  Admission Status Voluntary/72 hour document signed  Psychosocial Assessment  Patient Complaints None  Eye Contact Fair  Facial Expression Animated  Affect Appropriate to circumstance  Speech Logical/coherent  Interaction Assertive;Flirtatious  Motor Activity Slow  Appearance/Hygiene Unremarkable  Behavior Characteristics Cooperative;Appropriate to situation;Calm  Mood Pleasant  Thought Process  Coherency WDL  Content WDL  Delusions None reported or observed  Perception WDL  Hallucination None reported or observed  Judgment Poor  Confusion None  Danger to Self  Current suicidal ideation? Denies  Agreement Not to Harm Self Yes  Description of Agreement verbal  Danger to Others  Danger to Others None reported or observed

## 2023-11-08 NOTE — BHH Counselor (Signed)
 Adult Comprehensive Assessment  Patient ID: DRAVIN LANCE, male   DOB: 1980/05/05, 43 y.o.   MRN: [id number]  Information Source:    Current Stressors:  Patient states their primary concerns and needs for treatment are:: Housing and resources Patient states their goals for this hospitilization and ongoing recovery are:: housing pt is currently homeless Educational / Learning stressors: n/a Employment / Job issues: n/a Family Relationships: none reported Surveyor, quantity / Lack of resources (include bankruptcy): receive disability Housing / Lack of housing: currently homeless Physical health (include injuries & life threatening diseases): pt reported past surgeries and having to learn to walk again Social relationships: i don't have to be friends Substance abuse: i used something a few days ago i dont know what was in there Bereavement / Loss: loss a friendship with a friend and relationship with my son  Living/Environment/Situation:  Living conditions (as described by patient or guardian): currently homeless  Family History:  Are you sexually active?: No What is your sexual orientation?: Straight Has your sexual activity been affected by drugs, alcohol, medication, or emotional stress?: Not at this time Does patient have children?: No  Childhood History:  By whom was/is the patient raised?: Mother Additional childhood history information: Raised by father Description of patient's relationship with caregiver when they were a child: It was good. Patient's description of current relationship with people who raised him/her: it was good but then he was made at me because i made bad decisions How were you disciplined when you got in trouble as a child/adolescent?: Whoopings Does patient have siblings?: Yes Number of Siblings: 1 Description of patient's current relationship with siblings: my brother committed suicide in 63 and my daughter passed when she was 4 Did patient  suffer from severe childhood neglect?: No Has patient ever been sexually abused/assaulted/raped as an adolescent or adult?: No Was the patient ever a victim of a crime or a disaster?: No Witnessed domestic violence?: No Has patient been affected by domestic violence as an adult?: No  Education:  Highest grade of school patient has completed:  10th grade , GED Currently a student?: No Learning disability?: No  Employment/Work Situation:   Employment Situation: On disability Why is Patient on Disability: due to accident How Long has Patient Been on Disability: for aboutb2 years Patient's Job has Been Impacted by Current Illness: No What is the Longest Time Patient has Held a Job?: Over 6 years Where was the Patient Employed at that Time?: Landscaping Has Patient ever Been in the [employer]?: No  Financial Resources:   Financial resources: Medicaid Does patient have a Lawyer or guardian?: No  Alcohol/Substance Abuse:   What has been your use of drugs/alcohol within the last 12 months?: alcohol and meth If attempted suicide, did drugs/alcohol play a role in this?: No Alcohol/Substance Abuse Treatment Hx: Attends AA/NA If yes, describe treatment: i have done some of them Has alcohol/substance abuse ever caused legal problems?: Yes  Social Support System:   Patient's Community Support System: None Describe Community Support System: i don't have noone, the Lord Type of faith/religion: grew up baptist, I believe in God How does patient's faith help to cope with current illness?:  sometimes I be using drugs and alcohol but i pray  Leisure/Recreation:   Do You Have Hobbies?: Yes Leisure and Hobbies: walk  Strengths/Needs:   What is the patient's perception of their strengths?:  remaining positive and fishing, sometimes i like being by myself solitude Patient states they can use these personal  strengths during their treatment to contribute to their  recovery:  remain positive Patient states these barriers may affect/interfere with their treatment: none reported Patient states these barriers may affect their return to the community: being able to get to appointments Other important information patient would like considered in planning for their treatment: AA  Discharge Plan:   Currently receiving community mental health services: No Patient states concerns and preferences for aftercare planning are:  AA meetings Patient states they will know when they are safe and ready for discharge when:  I feel good now, im not thinking suicidal Does patient have access to transportation?: No Patient description of barriers related to discharge medications: PRIMARY INS: [facility] [employer] Plan for no access to transportation at discharge: CSW to coordinate Plan for living situation after discharge: Pt is currently homeless, CSW will provide [place] housing list Will patient be returning to same living situation after discharge?: No  Summary/Recommendations:   Summary and Recommendations (to be completed by the evaluator): BERTRAM HADDIX is a 43 y.o., male with history of alcohol use disorder, substance-induced mood disorder and homelessness who is admitted to [facility] for alcohol intoxication and suicidal ideation. He has a history of 1 suicide attempt via self-harm of his wrist and 1 psychiatric hospitalization. He was admitted for alcohol detoxification and crisis stabilization. Pt would benefit from AA and some resources to help provide stabilization for housing due to this being his primary stressor.  Golda Louder. LCSWA 11/08/2023

## 2023-11-08 NOTE — Progress Notes (Signed)
 Patient withdrew 72 hour request for discharge 11/08/23 at 1645.

## 2023-11-09 ENCOUNTER — Encounter (HOSPITAL_COMMUNITY): Payer: Self-pay

## 2023-11-09 DIAGNOSIS — F1994 Other psychoactive substance use, unspecified with psychoactive substance-induced mood disorder: Secondary | ICD-10-CM

## 2023-11-09 DIAGNOSIS — F1020 Alcohol dependence, uncomplicated: Secondary | ICD-10-CM

## 2023-11-09 LAB — RESP PANEL BY RT-PCR (RSV, FLU A&B, COVID)  RVPGX2
Influenza A by PCR: NEGATIVE
Influenza B by PCR: NEGATIVE
Resp Syncytial Virus by PCR: NEGATIVE
SARS Coronavirus 2 by RT PCR: NEGATIVE

## 2023-11-09 NOTE — Plan of Care (Signed)
   Problem: Education: Goal: Knowledge of [place] [facility] Education information/materials will improve Outcome: Progressing Goal: Emotional status will improve Outcome: Progressing Goal: Mental status will improve Outcome: Progressing Goal: Verbalization of understanding the information provided will improve Outcome: Progressing

## 2023-11-09 NOTE — Group Note (Signed)
 Occupational Therapy Group Note  Group Topic: Sleep Hygiene  Group Date: 11/09/2023 Start Time: 1500 End Time: 1535 Facilitators: Dot [place] MATSU, OT   Group Description: Group encouraged increased participation and engagement through topic focused on sleep hygiene. Patients reflected on the quality of sleep they typically receive and identified areas that need improvement. Group was given background information on sleep and sleep hygiene, including common sleep disorders. Group members also received information on how to improve one's sleep and introduced a sleep diary as a tool that can be utilized to track sleep quality over a length of time. Group session ended with patients identifying one or more strategies they could utilize or implement into their sleep routine in order to improve overall sleep quality.        Therapeutic Goal(s):  Identify one or more strategies to improve overall sleep hygiene  Identify one or more areas of sleep that are negatively impacted (sleep too much, too little, etc)     Participation Level: Engaged   Participation Quality: Independent   Behavior: Appropriate   Speech/Thought Process: Relevant   Affect/Mood: Appropriate   Insight: Fair   Judgement: Fair      Modes of Intervention: Education  Patient Response to Interventions:  Attentive   Plan: Continue to engage patient in OT groups 2 - 3x/week.  11/09/2023  [place] MATSU Dot, OT John [name], OT

## 2023-11-09 NOTE — Progress Notes (Signed)
 CONTACT NOTE: CSW phone call and voicemail   Contacted:  Rona Monte (care manager) [phone]    [facility] (main office) [phone]   CSW LVM for Ms.Powell in regards to patient. When unsuccessful CSW contacted main office to inform Ms.Monte of contact attempt. Demi, main Investment banker, corporate, provided supervisor phone number as well: [place] seismologist supervisor) [phone]  Louetta Lame, LCSW-A 11/09/23

## 2023-11-09 NOTE — Progress Notes (Signed)
 Spiritual care group on grief and loss facilitated by Chaplain Rockie Sofia, [facility]  Group Goal: Support / Education around grief and loss  Members engage in facilitated group support and psycho-social education.  Group Description:  Following introductions and group rules, group members engaged in facilitated group dialogue and support around topic of loss, with particular support around experiences of loss in their lives. Group Identified types of loss (relationships / self / things) and identified patterns, circumstances, and changes that precipitate losses. Reflected on thoughts / feelings around loss, normalized grief responses, and recognized variety in grief experience. Group encouraged individual reflection on safe space and on the coping skills that they are already utilizing.  Group drew on Adlerian / Rogerian and narrative framework  Patient Progress: John [name] attended group and actively engaged and participated in group conversation and activities.  He shared about the loss of his brother to suicide at a young age.  He requested to meet with me individually for prayer.

## 2023-11-09 NOTE — BH Assessment (Signed)
(  Sleep Hours) - 7.75 (Any PRNs that were needed, meds refused, or side effects to meds)- Trazodone  100 mg PO (Any disturbances and when (visitation, over night)- None (Concerns raised by the patient)- Pt request a copy of labs R/T Covid results for placement location's records.  (SI/HI/AVH)- Denies

## 2023-11-09 NOTE — [facility]HH Suicide Risk Assessment (Signed)
 [facility] INPATIENT:  Family/Significant Other Suicide Prevention Education  Suicide Prevention Education:  Education Completed; Francis Hoit (friend) [phone],  (name of family member/significant other) has been identified by the patient as the family member/significant other with whom the patient will be residing, and identified as the person(s) who will aid the patient in the event of a mental health crisis (suicidal ideations/suicide attempt).  With written consent from the patient, the family member/significant other has been provided the following suicide prevention education, prior to the and/or following the discharge of the patient.  The suicide prevention education provided includes the following: Suicide risk factors Suicide prevention and interventions National Suicide Hotline telephone number [facility] assessment telephone number [facility] Emergency Assistance [facility] and/or Residential Mobile Crisis Unit telephone number  Request made of family/significant other to: Remove weapons (e.g., guns, rifles, knives), all items previously/currently identified as safety concern.   Remove drugs/medications (over-the-counter, prescriptions, illicit drugs), all items previously/currently identified as a safety concern.  Francis Hoit agreed to allow the patient to discharge to his home located at [address] [place], [place]. Francis denies any access to weapons, illicit drugs, or alcohol in the home. Francis is also aware of what to do in the event of a mental health crisis.   The family member/significant other verbalizes understanding of the suicide prevention education information provided.  The family member/significant other agrees to remove the items of safety concern listed above.  Louetta Lame 11/09/2023, 4:05 PM

## 2023-11-09 NOTE — BH IP Treatment Plan (Signed)
 Interdisciplinary Treatment and Diagnostic Plan Update  11/09/2023 Time of Session: 10:45 AM John [name] MRN: [id number]  Principal Diagnosis: Alcohol use disorder, severe, dependence (HCC)  Secondary Diagnoses: Principal Problem:   Alcohol use disorder, severe, dependence (HCC) Active Problems:   Substance induced mood disorder (HCC)   Current Medications:  Current Facility-Administered Medications  Medication Dose Route Frequency Provider Last Rate Last Admin   acetaminophen  (TYLENOL ) tablet 650 mg  650 mg Oral Q6H PRN Weber, Kyra A, NP   650 mg at 11/08/23 1506   alum & mag hydroxide-simeth (MAALOX/MYLANTA) 200-200-20 MG/5ML suspension 30 mL  30 mL Oral Q4H PRN Weber, Kyra A, NP   30 mL at 11/08/23 1633   haloperidol  (HALDOL ) tablet 5 mg  5 mg Oral TID PRN Weber, Kyra A, NP       And   diphenhydrAMINE  (BENADRYL ) capsule 50 mg  50 mg Oral TID PRN Weber, Kyra A, NP       haloperidol  lactate (HALDOL ) injection 5 mg  5 mg Intramuscular TID PRN Weber, Kyra A, NP       And   diphenhydrAMINE  (BENADRYL ) injection 50 mg  50 mg Intramuscular TID PRN Weber, Kyra A, NP       And   LORazepam  (ATIVAN ) injection 2 mg  2 mg Intramuscular TID PRN Weber, Kyra A, NP       haloperidol  lactate (HALDOL ) injection 10 mg  10 mg Intramuscular TID PRN Weber, Kyra A, NP       And   diphenhydrAMINE  (BENADRYL ) injection 50 mg  50 mg Intramuscular TID PRN Weber, Kyra A, NP       And   LORazepam  (ATIVAN ) injection 2 mg  2 mg Intramuscular TID PRN Weber, Kyra A, NP       gabapentin  (NEURONTIN ) capsule 300 mg  300 mg Oral Q8H Lynnette Barter, MD   300 mg at 11/09/23 9043   magnesium  hydroxide (MILK OF MAGNESIA) suspension 30 mL  30 mL Oral Daily PRN Weber, Kyra A, NP       naltrexone  (DEPADE) tablet 50 mg  50 mg Oral Daily Ji, Andrew, MD   50 mg at 11/09/23 0957   nicotine  (NICODERM CQ  - dosed in mg/24 hours) patch 14 mg  14 mg Transdermal Daily Pashayan, Alexander S, DO   14 mg at 11/09/23 0957   propranolol   (INDERAL ) tablet 10 mg  10 mg Oral BID PRN Lynnette Barter, MD       thiamine  (Vitamin B-1) tablet 100 mg  100 mg Oral Daily Lynnette Barter, MD   100 mg at 11/09/23 0957   traZODone  (DESYREL ) tablet 100 mg  100 mg Oral QHS PRN Weber, Kyra A, NP   100 mg at 11/08/23 2118   PTA Medications: Medications Prior to Admission  Medication Sig Dispense Refill Last Dose/Taking   ATIVAN  1 MG tablet Take 1 mg by mouth 2 (two) times daily as needed. (Patient not taking: Reported on 11/06/2023)      cloNIDine (CATAPRES) 0.1 MG tablet Take 0.1 mg by mouth 2 (two) times daily. (Patient not taking: Reported on 11/06/2023)      diazepam  (VALIUM ) 5 MG tablet Take 5 mg by mouth daily. (Patient not taking: Reported on 11/06/2023)      escitalopram (LEXAPRO) 5 MG tablet Take 5 mg by mouth daily. (Patient not taking: Reported on 11/06/2023)      famotidine  (PEPCID ) 20 MG tablet Take 20 mg by mouth daily. (Patient not taking: Reported on 11/06/2023)  folic acid  (FOLVITE ) 1 MG tablet Take 1 mg by mouth daily. (Patient not taking: Reported on 11/06/2023)      gabapentin  (NEURONTIN ) 300 MG capsule Take 1 capsule (300 mg total) by mouth 3 (three) times daily. (Patient not taking: Reported on 11/06/2023) 90 capsule 0    hydrOXYzine  (ATARAX ) 50 MG tablet Take 1 tablet (50 mg total) by mouth 3 (three) times daily as needed for anxiety. (Patient not taking: Reported on 11/06/2023) 60 tablet 00    naltrexone  (DEPADE) 50 MG tablet Take 1 tablet (50 mg total) by mouth daily. 30 tablet 0    nicotine  (NICODERM CQ  - DOSED IN MG/24 HOURS) 21 mg/24hr patch Place 1 patch (21 mg total) onto the skin daily. (Patient not taking: Reported on 11/06/2023) 28 patch 0    propranolol  (INDERAL ) 10 MG tablet Take 1 tablet (10 mg total) by mouth 2 (two) times daily as needed. (Patient not taking: Reported on 11/06/2023) 60 tablet 0    sertraline  (ZOLOFT ) 25 MG tablet Take 25 mg by mouth at bedtime. (Patient not taking: Reported on 11/06/2023)      traZODone   (DESYREL ) 100 MG tablet Take 0.5-1 tablets (50-100 mg total) by mouth at bedtime as needed for sleep. (Patient not taking: Reported on 11/06/2023) 30 tablet 0     Patient Stressors: Financial difficulties   Medication change or noncompliance   Substance abuse    Patient Strengths: Ability for insight  Active sense of humor   Treatment Modalities: Medication Management, Group therapy, Case management,  1 to 1 session with clinician, Psychoeducation, Recreational therapy.   Physician Treatment Plan for Primary Diagnosis: Alcohol use disorder, severe, dependence (HCC) Long Term Goal(s):     Short Term Goals:    Medication Management: Evaluate patient's response, side effects, and tolerance of medication regimen.  Therapeutic Interventions: 1 to 1 sessions, Unit Group sessions and Medication administration.  Evaluation of Outcomes: Not Progressing  Physician Treatment Plan for Secondary Diagnosis: Principal Problem:   Alcohol use disorder, severe, dependence (HCC) Active Problems:   Substance induced mood disorder (HCC)  Long Term Goal(s):     Short Term Goals:       Medication Management: Evaluate patient's response, side effects, and tolerance of medication regimen.  Therapeutic Interventions: 1 to 1 sessions, Unit Group sessions and Medication administration.  Evaluation of Outcomes: Not Progressing   RN Treatment Plan for Primary Diagnosis: Alcohol use disorder, severe, dependence (HCC) Long Term Goal(s): Knowledge of disease and therapeutic regimen to maintain health will improve  Short Term Goals: Ability to remain free from injury will improve, Ability to verbalize frustration and anger appropriately will improve, Ability to demonstrate self-control, Ability to participate in decision making will improve, Ability to verbalize feelings will improve, Ability to disclose and discuss suicidal ideas, Ability to identify and develop effective coping behaviors will improve, and  Compliance with prescribed medications will improve  Medication Management: RN will administer medications as ordered by provider, will assess and evaluate patient's response and provide education to patient for prescribed medication. RN will report any adverse and/or side effects to prescribing provider.  Therapeutic Interventions: 1 on 1 counseling sessions, Psychoeducation, Medication administration, Evaluate responses to treatment, Monitor vital signs and CBGs as ordered, Perform/monitor CIWA, COWS, AIMS and Fall Risk screenings as ordered, Perform wound care treatments as ordered.  Evaluation of Outcomes: Not Progressing   LCSW Treatment Plan for Primary Diagnosis: Alcohol use disorder, severe, dependence (HCC) Long Term Goal(s): Safe transition to appropriate next level of  care at discharge, Engage patient in therapeutic group addressing interpersonal concerns.  Short Term Goals: Engage patient in aftercare planning with referrals and resources, Increase social support, Increase ability to appropriately verbalize feelings, Increase emotional regulation, Facilitate acceptance of mental health diagnosis and concerns, Facilitate patient progression through stages of change regarding substance use diagnoses and concerns, Identify triggers associated with mental health/substance abuse issues, and Increase skills for wellness and recovery  Therapeutic Interventions: Assess for all discharge needs, 1 to 1 time with Social worker, Explore available resources and support systems, Assess for adequacy in community support network, Educate family and significant other(s) on suicide prevention, Complete Psychosocial Assessment, Interpersonal group therapy.  Evaluation of Outcomes: Not Progressing   Progress in Treatment: Attending groups: Yes. Participating in groups: Yes. Taking medication as prescribed: Yes. Toleration medication: Yes. Family/Significant other contact made: No, will contact:   Patient declined consents. Patient understands diagnosis: Yes. Discussing patient identified problems/goals with staff: Yes. Medical problems stabilized or resolved: Yes. Denies suicidal/homicidal ideation: Yes. Issues/concerns per patient self-inventory: No. None reported.  New problem(s) identified: No, Describe:  None reported.  New Short Term/Long Term Goal(s): detox, medication management for mood stabilization; elimination of SI thoughts; development of comprehensive mental wellness/sobriety plan   Patient Goals:  I want to get out of here. Do therapy in person in [facility]   Discharge Plan or Barriers: Patient recently admitted. CSW will continue to follow and assess for appropriate referrals and possible discharge planning.    Reason for Continuation of Hospitalization: Depression Medication stabilization Withdrawal symptoms  Estimated Length of Stay: 1-3 days  Last 3 [place] Suicide Severity Risk Score: Flowsheet Row Admission (Current) [facility] 11/05/2023 in [facility] ED to Hosp-Admission (Discharged) from 08/23/2023 in [place] [facility] 4TH FLOOR PROGRESSIVE CARE AND UROLOGY  C-SSRS RISK CATEGORY Low Risk No Risk High Risk    Last PHQ 2/9 Scores:    08/09/2023   10:05 AM 08/06/2023   11:20 AM 07/19/2019    3:18 PM  Depression screen PHQ 2/9  Decreased Interest 1 1 1   Down, Depressed, Hopeless 1 1 3   PHQ - 2 Score 2 2 4   Altered sleeping 1 1 3   Tired, decreased energy 1 1 0  Change in appetite 1 1 1   Feeling bad or failure about yourself  1 1 1   Trouble concentrating 1 1 1   Moving slowly or fidgety/restless 0 0 0  Suicidal thoughts 0 1 0  PHQ-9 Score 7 8 10   Difficult doing work/chores   Somewhat difficult    Scribe for Treatment Team: Louetta Wynona SILK 11/09/2023 12:01 PM

## 2023-11-09 NOTE — Plan of Care (Signed)

## 2023-11-09 NOTE — Progress Notes (Signed)
 CONTACT NOTE:  CSW spoke with Rona Monte (tailor care manager) at [facility] in [place], [place]. Ms.Powell confirmed this patient is a client of hers and is requiring a negative COVID test in order to meet with patient on Thursday 9/18 due to company policy.   Louetta Lame, LCSW-A 11/09/23

## 2023-11-09 NOTE — Group Note (Signed)
 Date:  11/09/2023 Time:  9:46 PM  Group Topic/Focus:  Wrap-Up Group:   The focus of this group is to help patients review their daily goal of treatment and discuss progress on daily workbooks.    Participation Level:  Active  Participation Quality:  Appropriate  Affect:  Appropriate  Cognitive:  Alert  Insight: Appropriate  Engagement in Group:  Engaged  Modes of Intervention:  Activity  Additional Comments:    Leigh VEAR Pais 11/09/2023, 9:46 PM

## 2023-11-09 NOTE — Group Note (Signed)
 Recreation Therapy Group Note   Group Topic:Communication  Group Date: 11/09/2023 Start Time: 0930 End Time: 1000 Facilitators: Mckaylah Bettendorf-McCall, LRT,CTRS Location: [address] Dayroom   Group Topic: Communication, Team Building, Problem Solving  Goal Area(s) Addresses:  Patient will effectively work with peer towards shared goal.  Patient will identify skills used to make activity successful.  Patient will identify how skills used during activity can be applied to reach post d/c goals.   Behavioral Response:   Intervention: STEM Activity- Glass blower/designer  Activity: Tallest [employer]. In teams of 5-6, patients were given 11 craft pipe cleaners. Using the materials provided, patients were instructed to compete again the opposing team(s) to build the tallest free-standing structure from floor level. The activity was timed; difficulty increased by Clinical research associate as Production designer, theatre/television/film continued.  Systematically resources were removed with additional directions for example, placing one arm behind their back, working in silence, and shape stipulations. LRT facilitated post-activity discussion reviewing team processes and necessary communication skills involved in completion. Patients were encouraged to reflect how the skills utilized, or not utilized, in this activity can be incorporated to positively impact support systems post discharge.  Education: Pharmacist, community, Scientist, physiological, Discharge Planning   Education Outcome: Acknowledges education/In group clarification offered/Needs additional education.    Affect/Mood: N/A   Participation Level: Did not attend    Clinical Observations/Individualized Feedback:     Plan: Continue to engage patient in RT group sessions 2-3x/week.   Aws Shere-McCall, LRT,CTRS  11/09/2023 12:50 PM

## 2023-11-09 NOTE — Progress Notes (Signed)
 D: Patient is alert, oriented, pleasant, and cooperative. Denies SI, HI, AVH, and verbally contracts for safety. Patient reports he slept good last night with sleeping medication. Patient reports his appetite as good and concentration as good. Patient rates his depression 7/10, hopelessness 9/10, and anxiety 7/10.    A: Scheduled medications administered per MD order. Support provided. Patient educated on safety on the unit and medications. Routine safety checks every 15 minutes. Patient stated understanding to tell nurse about any new physical symptoms. Patient understands to tell staff of any needs.     R: No adverse drug reactions noted. Patient remains safe at this time and will continue to monitor.    11/09/23 1000  Psych Admission Type (Psych Patients Only)  Admission Status Voluntary  Psychosocial Assessment  Patient Complaints None  Eye Contact Fair  Facial Expression Animated  Affect Appropriate to circumstance  Speech Logical/coherent  Interaction Assertive;Flirtatious  Motor Activity Other (Comment) (WNL)  Appearance/Hygiene Unremarkable  Behavior Characteristics Cooperative;Appropriate to situation;Calm  Mood Pleasant  Thought Process  Coherency WDL  Content WDL  Delusions None reported or observed  Perception WDL  Hallucination None reported or observed  Judgment Poor  Confusion None  Danger to Self  Current suicidal ideation? Denies  Agreement Not to Harm Self Yes  Description of Agreement verbal  Danger to Others  Danger to Others None reported or observed

## 2023-11-09 NOTE — Progress Notes (Signed)
 [facility] MD Progress Note  11/09/2023 8:21 PM John [name]  MRN:  [id number]  Principal Problem: Alcohol use disorder, severe, dependence (HCC) Diagnosis: Principal Problem:   Alcohol use disorder, severe, dependence (HCC) Active Problems:   Substance induced mood disorder (HCC)   Reason for Admission:  John [name] is a 43 y.o., male with history of alcohol use disorder, substance-induced mood disorder and homelessness who is admitted to [facility] for alcohol intoxication and suicidal ideation. He has a history of 1 suicide attempt via self-harm of his wrist and 1 psychiatric hospitalization. He was admitted for alcohol detoxification and crisis stabilization (admitted on 11/06/2023, total  LOS: 3 days ).   Chart Review from last 24 hours:  The patient's chart was reviewed and nursing notes were reviewed. The patient's case was discussed in multidisciplinary team meeting.  - Overnight events to report per chart review / staff report: none - Patient took all prescribed medications yes - Patient received the following PRNs: trazodone    Daily Evaluation: On assessment today, John [name] reports that his mood is euthymic, improved since admission, and stable. Denies feeling down, depressed, or sad. Reports that anxiety symptoms are at manageable level. Sleep and appetite reported stable. Concentration is without complaint. Reports energy level is adequate. Denies having any suicidal thoughts. Denies having any suicidal intent and plan. Denies having any homicidal ideation. Denies having psychotic symptoms. Denies having side effects to current psychiatric medications.   Patient reports that he was admitted voluntarily and was under the impression that he could leave at any time. He states that he needs to be discharged so he John [name] look for housing, as he is currently unhoused. He notes that he intends to stay with a friend in [place]. He declines interest in inpatient substance use treatment but reports that  he was provided resources by the social worker yesterday and plans to attend AA meetings. He expresses interest in a community therapy referral, preferring in-person sessions in [place] due to bus access. He shares that he has been attending unit groups and finds some helpful. His stated daily goal is to work on his attitude and be nice. He states he has a IT trainer, and reports that she requested his COVID test results because he has an appointment with her on Thursday, September 18.   Discussed discharge planning and emphasized medication adherence and attendance at follow-up appointments to prevent symptom exacerbation.  We discussed psychosocial stressors, including lack of housing.  Discussed with the patient the critical importance of complete cessation of alcohol and other substances to support recovery, highlighting that abstaining John [name] lead to improve mood stability, enhance cognitive function, better sleep, and lower anxiety.    The social work team contacted the patient's care Production designer, theatre/television/film at [employer] in [place], who confirmed he is an active client. She requested a negative COVID test result before meeting with him on September 18, consistent with company policy and due to his reported symptoms prior to hospitalization. A COVID test will be ordered to ensure he John [name] attend this appointment, which is important for his housing and resource needs.   Overall, the patient's mood has improved and remains stable. No overt psychotic symptoms or manic features are observed.  He denies experiencing withdrawal symptoms and continues naltrexone  for management of cravings. He continues to decline formal addiction treatment at this time.The case was reviewed with the attending psychiatrist.  Plan to continue current treatment regimen.  Patient is scheduled for discharge tomorrow, Tuesday, September 16. Aftercare planning  is in progress with assistance from the social work team.   PLAN: #Alcohol  use disorder, severe dependence #Substance-induced mood disorder Last alcoholic drink 11/05/23 - CIWA with symptom triggered Ativan , last CIWA=2 - MVI/thiamine  - Gabapentin  300 mg 3 times daily - Naltrexone  50 mg daily - Nicotine  patch/gum for NRT - Propranolol  10 mg twice daily as needed for social anxiety - trazodone  100 mg at bedtime prn for sleep   -PRNs: Tylenol  maalox, milk of magnesia, hydroxyzine , trazodone  -- As needed agitation protocol in-place   Disposition Planning: -- Estimated LOS: 3-5 days --Estimated Discharge Date: 9/17 --Barriers to Discharge: alcohol withdrawal -- Discharge Goals: Return home with outpatient referrals for mental health follow-up including medication management/psychotherapy    Current Medications: Current Facility-Administered Medications  Medication Dose Route Frequency Provider Last Rate Last Admin   acetaminophen  (TYLENOL ) tablet 650 mg  650 mg Oral Q6H PRN Weber, Kyra A, NP   650 mg at 11/08/23 1506   alum & mag hydroxide-simeth (MAALOX/MYLANTA) 200-200-20 MG/5ML suspension 30 mL  30 mL Oral Q4H PRN Weber, Kyra A, NP   30 mL at 11/08/23 1633   haloperidol  (HALDOL ) tablet 5 mg  5 mg Oral TID PRN Weber, Kyra A, NP       And   diphenhydrAMINE  (BENADRYL ) capsule 50 mg  50 mg Oral TID PRN Weber, Kyra A, NP       haloperidol  lactate (HALDOL ) injection 5 mg  5 mg Intramuscular TID PRN Weber, Kyra A, NP       And   diphenhydrAMINE  (BENADRYL ) injection 50 mg  50 mg Intramuscular TID PRN Weber, Kyra A, NP       And   LORazepam  (ATIVAN ) injection 2 mg  2 mg Intramuscular TID PRN Weber, Kyra A, NP       haloperidol  lactate (HALDOL ) injection 10 mg  10 mg Intramuscular TID PRN Weber, Kyra A, NP       And   diphenhydrAMINE  (BENADRYL ) injection 50 mg  50 mg Intramuscular TID PRN Weber, Kyra A, NP       And   LORazepam  (ATIVAN ) injection 2 mg  2 mg Intramuscular TID PRN Weber, Kyra A, NP       gabapentin  (NEURONTIN ) capsule 300 mg  300 mg Oral Q8H Lynnette Barter, MD   300 mg at 11/09/23 1512   magnesium  hydroxide (MILK OF MAGNESIA) suspension 30 mL  30 mL Oral Daily PRN Weber, Kyra A, NP       naltrexone  (DEPADE) tablet 50 mg  50 mg Oral Daily Ji, Andrew, MD   50 mg at 11/09/23 0957   nicotine  (NICODERM CQ  - dosed in mg/24 hours) patch 14 mg  14 mg Transdermal Daily Pashayan, Alexander S, DO   14 mg at 11/09/23 0957   propranolol  (INDERAL ) tablet 10 mg  10 mg Oral BID PRN Lynnette Barter, MD       thiamine  (Vitamin B-1) tablet 100 mg  100 mg Oral Daily Lynnette Barter, MD   100 mg at 11/09/23 0957   traZODone  (DESYREL ) tablet 100 mg  100 mg Oral QHS PRN Weber, Kyra A, NP   100 mg at 11/08/23 2118    Lab Results:  Results for orders placed or performed during the hospital encounter of 11/06/23 (from the past 48 hours)  Resp panel by RT-PCR (RSV, Flu A&B, Covid) Anterior Nasal Swab     Status: None   Collection Time: 11/09/23  7:12 PM   Specimen: Anterior Nasal Swab  Result  Value Ref Range   SARS Coronavirus 2 by RT PCR NEGATIVE NEGATIVE    Comment: (NOTE) SARS-CoV-2 target nucleic acids are NOT DETECTED.  The SARS-CoV-2 RNA is generally detectable in upper respiratory specimens during the acute phase of infection. The lowest concentration of SARS-CoV-2 viral copies this assay John [name] detect is 138 copies/mL. A negative result does not preclude SARS-Cov-2 infection and should not be used as the sole basis for treatment or other patient management decisions. A negative result may occur with  improper specimen collection/handling, submission of specimen other than nasopharyngeal swab, presence of viral mutation(s) within the areas targeted by this assay, and inadequate number of viral copies(<138 copies/mL). A negative result must be combined with clinical observations, patient history, and epidemiological information. The expected result is Negative.  Fact Sheet for Patients:  BloggerCourse.com  Fact Sheet for Healthcare  Providers:  SeriousBroker.it  This test is no t yet approved or cleared by the [place]  FDA and  has been authorized for detection and/or diagnosis of SARS-CoV-2 by FDA under an Emergency Use Authorization (EUA). This EUA will remain  in effect (meaning this test John [name] be used) for the duration of the COVID-19 declaration under Section 564(b)(1) of the Act, 21 U.S.C.section 360bbb-3(b)(1), unless the authorization is terminated  or revoked sooner.       Influenza A by PCR NEGATIVE NEGATIVE   Influenza B by PCR NEGATIVE NEGATIVE    Comment: (NOTE) The Xpert Xpress SARS-CoV-2/FLU/RSV plus assay is intended as an aid in the diagnosis of influenza from Nasopharyngeal swab specimens and should not be used as a sole basis for treatment. Nasal washings and aspirates are unacceptable for Xpert Xpress SARS-CoV-2/FLU/RSV testing.  Fact Sheet for Patients: BloggerCourse.com  Fact Sheet for Healthcare Providers: SeriousBroker.it  This test is not yet approved or cleared by the [place]  FDA and has been authorized for detection and/or diagnosis of SARS-CoV-2 by FDA under an Emergency Use Authorization (EUA). This EUA will remain in effect (meaning this test John [name] be used) for the duration of the COVID-19 declaration under Section 564(b)(1) of the Act, 21 U.S.C. section 360bbb-3(b)(1), unless the authorization is terminated or revoked.     Resp Syncytial Virus by PCR NEGATIVE NEGATIVE    Comment: (NOTE) Fact Sheet for Patients: BloggerCourse.com  Fact Sheet for Healthcare Providers: SeriousBroker.it  This test is not yet approved or cleared by the [place]  FDA and has been authorized for detection and/or diagnosis of SARS-CoV-2 by FDA under an Emergency Use Authorization (EUA). This EUA will remain in effect (meaning this test John [name] be used) for the  duration of the COVID-19 declaration under Section 564(b)(1) of the Act, 21 U.S.C. section 360bbb-3(b)(1), unless the authorization is terminated or revoked.  Performed at [facility], [address] [place], [place] [address]     Blood Alcohol level:  Lab Results  Component Value Date   ETH 192 (H) 11/05/2023   ETH 35 (H) 08/23/2023    Metabolic Labs: Lab Results  Component Value Date   HGBA1C 5.2 08/07/2023   MPG 102.54 08/07/2023   No results found for: PROLACTIN Lab Results  Component Value Date   CHOL 278 (H) 08/07/2023   TRIG 125 08/07/2023   HDL 56 08/07/2023   CHOLHDL 5.0 08/07/2023   VLDL 25 08/07/2023   LDLCALC 197 (H) 08/07/2023    Physical Findings: CIWA:  CIWA-Ar Total: 2  Psychiatric Specialty Exam: General Appearance: Casual   Eye Contact: Fair   Speech: Clear and  Coherent   Volume: Normal   Mood: denies  Affect: Congruent   Thought Content: Logical   Suicidal Thoughts: denies  Homicidal Thoughts: denies  Thought Process: Coherent   Orientation: Full (Time, Place and Person)     Memory: Immediate Fair   Judgment: poor  Insight: fair  Concentration: Good   Recall: Good   Fund of Knowledge: Good   Language: Good   Psychomotor Activity: wnl  Assets: Communication Skills; Desire for Improvement; Physical Health   Sleep: No data recorded   Review of Systems ROS  Vital Signs: Blood pressure 115/77, pulse 96, temperature 98 F (36.7 C), resp. rate 12, height 5' 6 (1.676 m), weight 70 kg, SpO2 95%. Body mass index is 24.92 kg/m. Physical Exam  I certify that inpatient services furnished John [name] reasonably be expected to improve the patient's condition.   Signed: Blair Chiquita Hint, NP 11/09/2023, 8:21 PMPatient ID: John [name], male   DOB: 20-Jun-1980, 43 y.o.   MRN: [id number]

## 2023-11-09 NOTE — Group Note (Signed)
 Date:  11/09/2023 Time:  5:04 PM  Group Topic/Focus:  Ask Me 3   Group focused on using Ask Me 3 communication tool to learn about their condition and treatment.   Participation Level:  Active  Participation Quality:  Appropriate  Affect:  Appropriate  Cognitive:  Appropriate  Insight: Appropriate  Engagement in Group:  Engaged  Modes of Intervention:  Discussion  Additional Comments:    Aivah Putman 11/09/2023, 5:04 PM

## 2023-11-10 ENCOUNTER — Other Ambulatory Visit: Payer: Self-pay

## 2023-11-10 MED ORDER — VITAMIN B-1 100 MG PO TABS
100.0000 mg | ORAL_TABLET | Freq: Every day | ORAL | 0 refills | Status: DC
  Filled 2023-11-10: qty 14, 14d supply, fill #0

## 2023-11-10 MED ORDER — TRAZODONE HCL 100 MG PO TABS
100.0000 mg | ORAL_TABLET | Freq: Every evening | ORAL | 0 refills | Status: DC | PRN
  Filled 2023-11-10: qty 14, 14d supply, fill #0

## 2023-11-10 MED ORDER — TRAZODONE HCL 100 MG PO TABS: 100.0000 mg | ORAL_TABLET | Freq: Every evening | ORAL | 0 refills | Status: AC | PRN

## 2023-11-10 MED ORDER — NALTREXONE HCL 50 MG PO TABS: 50.0000 mg | ORAL_TABLET | Freq: Every day | ORAL | 0 refills | Status: DC

## 2023-11-10 MED ORDER — NICOTINE 14 MG/24HR TD PT24: 14.0000 mg | MEDICATED_PATCH | Freq: Every day | TRANSDERMAL | Status: DC

## 2023-11-10 MED ORDER — GABAPENTIN 300 MG PO CAPS: 300.0000 mg | ORAL_CAPSULE | Freq: Three times a day (TID) | ORAL | 0 refills | Status: DC

## 2023-11-10 MED ORDER — GABAPENTIN 300 MG PO CAPS
300.0000 mg | ORAL_CAPSULE | Freq: Three times a day (TID) | ORAL | 0 refills | Status: DC
  Filled 2023-11-10: qty 90, 30d supply, fill #0

## 2023-11-10 MED ORDER — NALTREXONE HCL 50 MG PO TABS
50.0000 mg | ORAL_TABLET | Freq: Every day | ORAL | 0 refills | Status: DC
  Filled 2023-11-10: qty 30, 30d supply, fill #0

## 2023-11-10 MED ORDER — VITAMIN B-1 100 MG PO TABS: 100.0000 mg | ORAL_TABLET | Freq: Every day | ORAL | 0 refills | Status: DC

## 2023-11-10 NOTE — Discharge Summary (Signed)
 Physician Discharge Summary Note  Patient:  John [name] is an 43 y.o., male MRN:  [id number] DOB:  1980-06-19 Patient phone:  [phone] (home)  Patient address:   [address] [place] [address],  Total Time spent with patient: 30 minutes  Date of Admission:  11/06/2023 Date of Discharge: 11/11/23   Reason for Admission:  [name] is a 43 y.o., male with history of alcohol use disorder, substance-induced mood disorder and homelessness who is admitted to [facility] for alcohol intoxication and suicidal ideation. He has a history of 1 suicide attempt via self-harm of his wrist and 1 psychiatric hospitalization. He was admitted for alcohol detoxification and crisis stabilization.  [name] is planned for discharge to his friend's home in [place].  He has been referred for medication management and counseling services. He is aware of follow-up plans and demonstrates insight into the importance of ongoing treatment and abstaining from alcohol and other psychoactive substances. He is future oriented and motivated toward recovery. No current safety concerns or acute psychiatric symptoms observed. No withdrawal symptoms or cravings reported.    Principal Problem: Alcohol use disorder, severe, dependence (HCC) Discharge Diagnoses: Principal Problem:   Alcohol use disorder, severe, dependence (HCC) Active Problems:   Substance induced mood disorder (HCC)   Past Psychiatric History: See H&P   Past Medical History:  Past Medical History:  Diagnosis Date   ASD (atrial septal defect)    Bacterial infection due to H. pylori    Hypertension    Kidney stones     Past Surgical History:  Procedure Laterality Date   ASD REPAIR     FOOT SURGERY Right    KNEE SURGERY Right    Family History: History reviewed. No pertinent family history. Family Psychiatric  History: See H&P  Social History:  Social History   Substance and Sexual Activity  Alcohol Use Yes   Comment: Last consumed Friday  06/10/19, pt states he is an alcoholic and wants to drink     Social History   Substance and Sexual Activity  Drug Use Yes   Types: Methamphetamines, Marijuana    Social History   Socioeconomic History   Marital status: Single    Spouse name: Not on file   Number of children: Not on file   Years of education: Not on file   Highest education level: Not on file  Occupational History   Not on file  Tobacco Use   Smoking status: Every Day    Types: Cigarettes    Passive exposure: Past   Smokeless tobacco: Never   Tobacco comments:    down to 2 cigarettes a day  Vaping Use   Vaping status: Never Used  Substance and Sexual Activity   Alcohol use: Yes    Comment: Last consumed Friday 06/10/19, pt states he is an alcoholic and wants to drink   Drug use: Yes    Types: Methamphetamines, Marijuana   Sexual activity: Yes    Birth control/protection: None  Other Topics Concern   Not on file  Social History Narrative   Not on file   Social Drivers of Health   Financial Resource Strain: High Risk (08/10/2019)   Overall Financial Resource Strain (CARDIA)    Difficulty of Paying Living Expenses: Very hard  Food Insecurity: Food Insecurity Present (11/06/2023)   Hunger Vital Sign    Worried About Running Out of Food in the Last Year: Sometimes true    Ran Out of Food in the Last Year: Sometimes  true  Transportation Needs: Unmet Transportation Needs (11/06/2023)   PRAPARE - Administrator, Civil Service (Medical): Yes    Lack of Transportation (Non-Medical): Yes  Physical Activity: Not on file  Stress: Not on file  Social Connections: Moderately Isolated (08/10/2019)   Social Connection and Isolation Panel    Frequency of Communication with Friends and Family: Never    Frequency of Social Gatherings with Friends and Family: Never    Attends Religious Services: 1 to 4 times per year    Active Member of [employer] or Organizations: Yes    Attends Banker  Meetings: 1 to 4 times per year    Marital Status: Never married    Hospital Course:  During the patient's hospitalization, patient had extensive initial psychiatric evaluation, and follow-up psychiatric evaluations every day.  Psychiatric diagnoses provided upon initial assessment:  Principal Problem:   Alcohol use disorder, severe, dependence (HCC) Active Problems:   Substance induced mood disorder (HCC)  Patient's psychiatric medications were adjusted on admission:   Gabapentin  300 mg TID was restarted, as it had previously been effective in managing both neuropathic pain and anxiety.  Naltrexone  50 mg daily was also restarted to assist with alcohol cravings.    During the hospitalization, no additional adjustments were made to the patient's psychiatric medication regimen.  Patient's care was discussed during the interdisciplinary team meeting every day during the hospitalization.  The patient denies having side effects to prescribed psychiatric medication.  Gradually, patient started adjusting to milieu. The patient was evaluated each day by a clinical provider to ascertain response to treatment. Improvement was noted by the patient's report of decreasing symptoms, improved sleep and appetite, affect, medication tolerance, behavior, and participation in unit programming.  Patient was asked each day to complete a self inventory noting mood, mental status, pain, new symptoms, anxiety and concerns.    Symptoms were reported as significantly decreased or resolved completely by discharge.   On day of discharge, the patient reports that their mood is stable. The patient denied having suicidal thoughts for more than 48 hours prior to discharge.  Patient denies having homicidal thoughts.  Patient denies having auditory hallucinations.  Patient denies any visual hallucinations or other symptoms of psychosis. The patient was motivated to continue taking medication with a goal of continued  improvement in mental health.   The patient reports their target psychiatric symptoms of anxiety, depression, and insomnia responded well to the psychiatric medications, and the patient reports overall benefit other psychiatric hospitalization. Supportive psychotherapy was provided to the patient. The patient also participated in regular group therapy while hospitalized. Coping skills, problem solving as well as relaxation therapies were also part of the unit programming.  Labs were reviewed with the patient, and abnormal results were discussed with the patient.  The patient is able to verbalize their individual safety plan to this provider.  # It is recommended to the patient to continue psychiatric medications as prescribed, after discharge from the hospital.    # It is recommended to the patient to follow up with your outpatient psychiatric provider and PCP.  # It was discussed with the patient, the impact of alcohol, drugs, tobacco have been there overall psychiatric and medical wellbeing, and total abstinence from substance use was recommended the patient.ed.  # Prescriptions provided or sent directly to preferred pharmacy at discharge. Patient agreeable to plan. Given opportunity to ask questions. Appears to feel comfortable with discharge.    # In the event of  worsening symptoms, the patient is instructed to call the crisis hotline, 911 and or go to the nearest ED for appropriate evaluation and treatment of symptoms. To follow-up with primary care provider for other medical issues, concerns and or health care needs  # Patient was discharged to friend, [facility] house, located at [address] [place], [place] with a plan to follow up as noted below.   Physical Findings: AIMS:  , ,  ,  ,  ,  ,   CIWA:  CIWA-Ar Total: 2 COWS:     Musculoskeletal: Strength & Muscle Tone: within normal limits Gait & Station: normal Patient leans: N/A   Psychiatric Specialty Exam:  Presentation   General Appearance:  Casual  Eye Contact: Good  Speech: Clear and Coherent; Normal Rate  Speech Volume: Normal  Handedness: Right   Mood and Affect  Mood: Euthymic  Affect: Appropriate; Full Range   Thought Process  Thought Processes: Coherent; Linear  Descriptions of Associations:Intact  Orientation:Full (Time, Place and Person)  Thought Content:WDL  History of Schizophrenia/Schizoaffective disorder:No  Duration of Psychotic Symptoms:No data recorded Hallucinations:Hallucinations: None  Ideas of Reference:None  Suicidal Thoughts:Suicidal Thoughts: No  Homicidal Thoughts:Homicidal Thoughts: No   Sensorium  Memory: Immediate Good; Recent Good; Remote Good  Judgment: Intact  Insight: Present   Executive Functions  Concentration: Good  Attention Span: Good  Recall: Good  Fund of Knowledge: Good  Language: Good   Psychomotor Activity  Psychomotor Activity:Psychomotor Activity: Normal   Assets  Assets: Communication Skills; Physical Health; Resilience; Social Support; Talents/Skills   Sleep  Sleep:Sleep: Good  Estimated Sleeping Duration (Last 24 Hours): 0.00 hours   Physical Exam: Physical Exam Vitals and nursing note reviewed.  Constitutional:      General: He is not in acute distress.    Appearance: Normal appearance. He is not ill-appearing.  HENT:     Mouth/Throat:     Pharynx: Oropharynx is clear.  Cardiovascular:     Rate and Rhythm: Normal rate.     Pulses: Normal pulses.  Pulmonary:     Effort: No respiratory distress.  Skin:    General: Skin is dry.  Neurological:     General: No focal deficit present.     Mental Status: He is alert and oriented to person, place, and time.    Review of Systems  Constitutional: Negative.   HENT: Negative.    Respiratory: Negative.    Cardiovascular: Negative.   Gastrointestinal: Negative.   Skin: Negative.   Neurological: Negative.   Psychiatric/Behavioral:   Positive for substance abuse (Cessation encouraged). Negative for hallucinations, memory loss and suicidal ideas. The patient is nervous/anxious (Stable for lower level of care) and has insomnia (Stable for lower level of care).    Blood pressure 127/80, pulse 95, temperature 97.7 F (36.5 C), resp. rate 20, height 5' 6 (1.676 m), weight 70 kg, SpO2 98%. Body mass index is 24.92 kg/m.   Social History   Tobacco Use  Smoking Status Every Day   Types: Cigarettes   Passive exposure: Past  Smokeless Tobacco Never  Tobacco Comments   down to 2 cigarettes a day   Tobacco Cessation:  A prescription for an FDA-approved tobacco cessation medication provided at discharge   Blood Alcohol level:  Lab Results  Component Value Date   ETH 192 (H) 11/05/2023   ETH 35 (H) 08/23/2023    Metabolic Disorder Labs:  Lab Results  Component Value Date   HGBA1C 5.2 08/07/2023   MPG 102.54 08/07/2023  No results found for: PROLACTIN Lab Results  Component Value Date   CHOL 278 (H) 08/07/2023   TRIG 125 08/07/2023   HDL 56 08/07/2023   CHOLHDL 5.0 08/07/2023   VLDL 25 08/07/2023   LDLCALC 197 (H) 08/07/2023    See Psychiatric Specialty Exam and Suicide Risk Assessment completed by Attending Physician prior to discharge.  Discharge destination:  Other:  Friend[facility] house, located at [address], [place]   Is patient on multiple antipsychotic therapies at discharge:  No   Has Patient had three or more failed trials of antipsychotic monotherapy by history:  No  Recommended Plan for Multiple Antipsychotic Therapies: NA  Discharge Instructions     Activity as tolerated - No restrictions   Complete by: As directed    Diet - low sodium heart healthy   Complete by: As directed       Allergies as of 11/10/2023       Reactions   Penicillins Other (See Comments)   Unknown         Medication List     STOP taking these medications    Ativan  1 MG tablet Generic  drug: LORazepam    cloNIDine 0.1 MG tablet Commonly known as: CATAPRES   diazepam  5 MG tablet Commonly known as: VALIUM    escitalopram 5 MG tablet Commonly known as: LEXAPRO   famotidine  20 MG tablet Commonly known as: PEPCID    folic acid  1 MG tablet Commonly known as: FOLVITE    hydrOXYzine  50 MG tablet Commonly known as: ATARAX    nicotine  21 mg/24hr patch Commonly known as: NICODERM CQ  - dosed in mg/24 hours Replaced by: nicotine  14 mg/24hr patch   propranolol  10 MG tablet Commonly known as: INDERAL    sertraline  25 MG tablet Commonly known as: ZOLOFT        TAKE these medications      Indication  gabapentin  300 MG capsule Commonly known as: NEURONTIN  Take 1 capsule (300 mg total) by mouth every 8 (eight) hours. What changed: when to take this  Indication: Abuse or Misuse of Alcohol, Generalized Anxiety Disorder   naltrexone  50 MG tablet Commonly known as: DEPADE Take 1 tablet (50 mg total) by mouth daily.  Indication: Abuse or Misuse of Alcohol   nicotine  14 mg/24hr patch Commonly known as: NICODERM CQ  - dosed in mg/24 hours Place 1 patch (14 mg total) onto the skin daily. Replaces: nicotine  21 mg/24hr patch  Indication: Nicotine  Addiction   thiamine  100 MG tablet Commonly known as: Vitamin B-1 Take 1 tablet (100 mg total) by mouth daily.  Indication: Alcohol use disorder   traZODone  100 MG tablet Commonly known as: DESYREL  Take 1 tablet (100 mg total) by mouth at bedtime as needed for sleep. What changed: how much to take  Indication: Trouble Sleeping        Follow-up Information     [facility], [facility]. Go on 11/11/2023.   Why: Please call this provider on 11/11/23 at 9:00 am to schedule a hospital follow up  assessment, to obtain therapy and/or medication management services.  You may also go between 9 am and 2 pm. Contact information: [address] [place] [address] [phone]                 Follow-up  recommendations:   Activity: as tolerated  Diet: heart healthy  Other: -Follow-up with your outpatient psychiatric provider -instructions on appointment date, time, and address (location) are provided to you in discharge paperwork.  -Take your  psychiatric medications as prescribed at discharge - instructions are provided to you in the discharge paperwork  -Follow-up with outpatient primary care doctor and other specialists -for management of preventative medicine and chronic medical disease  -Testing: Follow-up with outpatient provider for abnormal lab results:   -If you are prescribed an atypical antipsychotic medication, we recommend that your outpatient psychiatrist follow routine screening for side effects within 3 months of discharge, including monitoring: AIMS scale, height, weight, blood pressure, fasting lipid panel, HbA1c, and fasting blood sugar.   -Recommend total abstinence from alcohol, tobacco, and other illicit drug use at discharge.   -If your psychiatric symptoms recur, worsen, or if you have side effects to your psychiatric medications, call your outpatient psychiatric provider, 911, [phone] or go to the nearest emergency department.  -If suicidal thoughts occur, immediately call your outpatient psychiatric provider, 911, [phone] or go to the nearest emergency department.    Signed: Blair Chiquita Hint, NP 11/11/2023, 7:53 PM

## 2023-11-10 NOTE — Progress Notes (Signed)
   11/10/23 0900  Psych Admission Type (Psych Patients Only)  Admission Status Voluntary  Psychosocial Assessment  Patient Complaints None  Eye Contact Fair  Facial Expression Animated  Affect Appropriate to circumstance  Speech Logical/coherent  Interaction Minimal  Motor Activity Slow  Appearance/Hygiene Unremarkable  Behavior Characteristics Appropriate to situation  Mood Preoccupied  Thought Process  Coherency WDL  Content WDL  Delusions None reported or observed  Perception WDL  Hallucination None reported or observed  Judgment Poor  Confusion None  Danger to Self  Current suicidal ideation? Denies  Description of Suicide Plan No Plan  Agreement Not to Harm Self Yes  Description of Agreement Verbal Contract  Danger to Others  Danger to Others None reported or observed

## 2023-11-10 NOTE — BHH Suicide Risk Assessment (Signed)
 Suicide Risk Assessment  Discharge Assessment    [facility] Discharge Suicide Risk Assessment   Principal Problem: Alcohol use disorder, severe, dependence (HCC) Discharge Diagnoses: Principal Problem:   Alcohol use disorder, severe, dependence (HCC) Active Problems:   Substance induced mood disorder (HCC)   Total Time spent with patient: 30 minutes  Musculoskeletal: Strength & Muscle Tone: within normal limits Gait & Station: normal Patient leans: N/A  Psychiatric Specialty Exam  Presentation  General Appearance:  Casual  Eye Contact: Good  Speech: Clear and Coherent; Normal Rate  Speech Volume: Normal  Handedness: Right   Mood and Affect  Mood: Euthymic  Duration of Depression Symptoms: Greater than two weeks  Affect: Appropriate; Full Range   Thought Process  Thought Processes: Coherent; Linear  Descriptions of Associations:Intact  Orientation:Full (Time, Place and Person)  Thought Content:WDL  History of Schizophrenia/Schizoaffective disorder:No  Duration of Psychotic Symptoms:No data recorded Hallucinations:Hallucinations: None  Ideas of Reference:None  Suicidal Thoughts:Suicidal Thoughts: No  Homicidal Thoughts:Homicidal Thoughts: No   Sensorium  Memory: Immediate Good; Recent Good; Remote Good  Judgment: Intact  Insight: Present   Executive Functions  Concentration: Good  Attention Span: Good  Recall: Good  Fund of Knowledge: Good  Language: Good   Psychomotor Activity  Psychomotor Activity:Psychomotor Activity: Normal   Assets  Assets: Communication Skills; Physical Health; Resilience; Social Support; Talents/Skills   Sleep  Sleep:Sleep: Good  Estimated Sleeping Duration (Last 24 Hours): 6.00-8.50 hours  Physical Exam: Physical Exam ROS Blood pressure 127/80, pulse 95, temperature 97.7 F (36.5 C), resp. rate 20, height 5' 6 (1.676 m), weight 70 kg, SpO2 98%. Body mass index is 24.92  kg/m.  Mental Status Per Nursing Assessment::   On Admission:  Suicidal ideation indicated by patient  Demographic Factors:  Male and Caucasian  Loss Factors: Financial problems/change in socioeconomic status  Historical Factors: Prior suicide attempts and Impulsivity  Risk Reduction Factors:   Positive social support, Positive therapeutic relationship, and Positive coping skills or problem solving skills  Continued Clinical Symptoms:  More than one psychiatric diagnosis Previous Psychiatric Diagnoses and Treatments  Cognitive Features That Contribute To Risk:  None    Suicide Risk:  Minimal: No identifiable suicidal ideation.  Patients presenting with no risk factors but with morbid ruminations; may be classified as minimal risk based on the severity of the depressive symptoms. Patient is future oriented and motivated towards recovery.  No overt psychotic symptoms.  No manic features.  No identifiable risk for harm to self or others.  Stable for lower level of care.   Follow-up Information     [facility], [facility]. Go on 11/11/2023.   Why: Please call this provider on 11/11/23 at 9:00 am to schedule a hospital follow up  assessment, to obtain therapy and/or medication management services.  You may also go between 9 am and 2 pm. Contact information: [address] [place] [address] [phone]                 Plan Of Care/Follow-up recommendations:  See discharge summary.   Blair Chiquita Hint, NP 11/10/2023, 11:10 AM

## 2023-11-10 NOTE — Transportation (Signed)
 11/10/2023  John [name] DOB: 08-02-1980 MRN: [id number]   RIDER WAIVER AND RELEASE OF LIABILITY  For the purposes of helping with transportation needs, [facility] partners with outside transportation providers (taxi companies, [place], Catering manager.) to give [facility] patients or other approved people the choice of on-demand rides Public librarian) to our buildings for non-emergency visits.  By using [employer], I, the person signing this document, on behalf of myself and/or any legal minors (in my care using the [employer]), agree:  Science writer given to me are supplied by independent, outside transportation providers who do not work for, or have any affiliation with, [employer]. [facility] is not a transportation company. [facility] has no control over the quality or safety of the rides I get using [employer]. [facility] has no control over whether any outside ride will happen on time or not. [facility] gives no guarantee on the reliability, quality, safety, or availability on any rides, or that no mistakes will happen. I know and accept that traveling by vehicle (car, truck, SVU, fleeta, bus, taxi, etc.) has risks of serious injuries such as disability, being paralyzed, and death. I know and agree the risk of using [employer] is mine alone, and not [employer]. [employer] are provided as is and as are available. The transportation providers are in charge for all inspections and care of the vehicles used to provide these rides. I agree not to take legal action against [facility], its agents, employees, officers, directors, representatives, insurers, attorneys, assigns, successors, subsidiaries, and affiliates at any time for any reasons related directly or indirectly to using [employer]. I also agree not to take legal action against  or its affiliates for any injury, death, or damage to property caused by or related to using  [employer]. I have read this Waiver and Release of Liability, and I understand the terms used in it and their legal meaning. This Waiver is freely and voluntarily given with the understanding that my right (or any legal minors) to legal action against  relating to [employer] is knowingly given up to use these services.   I attest that I read the Ride Waiver and Release of Liability to John [name], gave [name] the opportunity to ask questions and answered the questions asked (if any). I affirm that John [name] then provided consent for assistance with transportation.

## 2023-11-10 NOTE — Progress Notes (Addendum)
  [facility] Adult Case Management Discharge Plan :  Will you be returning to the same living situation after discharge:  No. Patient will be discharging to friend, [facility], located at [address] [place], [place] At discharge, do you have transportation home?: No. CSW arranging transport with taxi voucher through [facility] [employer] at 11:15 AM.  Do you have the ability to pay for your medications: Yes,  patient has active health insurance.  Release of information consent forms completed and in the chart;  Patient's signature needed at discharge.  Patient to Follow up at:  Follow-up Information     [facility], [facility]. Go on 11/11/2023.   Why: Please call this provider on 11/11/23 at 9:00 am to schedule a hospital follow up  assessment, to obtain therapy and/or medication management services.  You may also go between 9 am and 2 pm. Contact information: [address] [place] [address] [phone]                 Next level of care provider has access to [facility] Link:no  Safety Planning and Suicide Prevention discussed: Yes,  completed with Francis Hoit (friend) [phone]     Has patient been referred to the Quitline?: Yes, faxed/e-referral on 11/10/23 at 9:15 AM  Patient has been referred for addiction treatment: Patient refused referral for treatment.  Louetta Lame, LCSWA 11/10/2023, 11:22 AM

## 2023-11-10 NOTE — Plan of Care (Signed)
   Problem: Education: Goal: Knowledge of [place] [facility] Education information/materials will improve Outcome: Progressing Goal: Emotional status will improve Outcome: Progressing Goal: Mental status will improve Outcome: Progressing Goal: Verbalization of understanding the information provided will improve Outcome: Progressing

## 2023-11-10 NOTE — Progress Notes (Signed)
 Discharge Note:  Pt discharged to lobby. Pt was stable and appreciative at that time. All papers and prescriptions were given and valuables returned. Verbal understanding expressed. Denies SI/HI and A/VH. Pt given opportunity to express concerns and ask questions.

## 2023-11-10 NOTE — BH Assessment (Signed)
(  Sleep Hours) - 8.25 (Any PRNs that were needed, meds refused, or side effects to meds)- Trazodone  100 mg PO (Any disturbances and when (visitation, over night)- None (Concerns raised by the patient)- Pt request copied document of negative Covid-19 results needed for facility admission following D/C from [facility]. (SI/HI/AVH)- Denies

## 2023-11-12 ENCOUNTER — Emergency Department
Admission: EM | Admit: 2023-11-12 | Discharge: 2023-11-12 | Disposition: A | Discharge status: Home / Self Care | Payer: MEDICAID

## 2023-11-12 ENCOUNTER — Emergency Department: Payer: MEDICAID

## 2023-11-12 ENCOUNTER — Other Ambulatory Visit: Payer: Self-pay

## 2023-11-12 DIAGNOSIS — R079 Chest pain, unspecified: Secondary | ICD-10-CM | POA: Diagnosis present

## 2023-11-12 DIAGNOSIS — F10929 Alcohol use, unspecified with intoxication, unspecified: Secondary | ICD-10-CM

## 2023-11-12 DIAGNOSIS — F10129 Alcohol abuse with intoxication, unspecified: Secondary | ICD-10-CM | POA: Insufficient documentation

## 2023-11-12 DIAGNOSIS — Z5900 Homelessness unspecified: Secondary | ICD-10-CM | POA: Diagnosis not present

## 2023-11-12 DIAGNOSIS — J9801 Acute bronchospasm: Secondary | ICD-10-CM | POA: Insufficient documentation

## 2023-11-12 DIAGNOSIS — Y906 Blood alcohol level of 120-199 mg/100 ml: Secondary | ICD-10-CM | POA: Insufficient documentation

## 2023-11-12 LAB — BASIC METABOLIC PANEL WITH GFR
Anion gap: 9 (ref 5–15)
BUN: 9 mg/dL (ref 6–20)
CO2: 24 mmol/L (ref 22–32)
Calcium: 8.2 mg/dL — ABNORMAL LOW (ref 8.9–10.3)
Chloride: 107 mmol/L (ref 98–111)
Creatinine, Ser: 0.7 mg/dL (ref 0.61–1.24)
GFR, Estimated: >60 mL/min (ref >60)
Glucose, Bld: 98 mg/dL (ref 70–99)
Potassium: 3.3 mmol/L — ABNORMAL LOW (ref 3.5–5.1)
Sodium: 140 mmol/L (ref 135–145)

## 2023-11-12 LAB — CBC WITH DIFFERENTIAL/PLATELET
Abs Immature Granulocytes: 0.06 K/uL (ref 0.00–0.07)
Basophils Absolute: 0.1 K/uL (ref 0.0–0.1)
Basophils Relative: 1 %
Eosinophils Absolute: 0.1 K/uL (ref 0.0–0.5)
Eosinophils Relative: 1 %
HCT: 40.4 % (ref 39.0–52.0)
Hemoglobin: 13.4 g/dL (ref 13.0–17.0)
Immature Granulocytes: 1 %
Lymphocytes Relative: 33 %
Lymphs Abs: 2.8 K/uL (ref 0.7–4.0)
MCH: 30.9 pg (ref 26.0–34.0)
MCHC: 33.2 g/dL (ref 30.0–36.0)
MCV: 93.3 fL (ref 80.0–100.0)
Monocytes Absolute: 0.7 K/uL (ref 0.1–1.0)
Monocytes Relative: 9 %
Neutro Abs: 5 K/uL (ref 1.7–7.7)
Neutrophils Relative %: 55 %
Platelets: 389 K/uL (ref 150–400)
RBC: 4.33 MIL/uL (ref 4.22–5.81)
RDW: 14.8 % (ref 11.5–15.5)
Smear Review: NORMAL
WBC: 8.7 K/uL (ref 4.0–10.5)
nRBC: 0 % (ref 0.0–0.2)

## 2023-11-12 LAB — SALICYLATE LEVEL: Salicylate Lvl: <7 mg/dL — ABNORMAL LOW (ref 7.0–30.0)

## 2023-11-12 LAB — ETHANOL: Alcohol, Ethyl (B): 140 mg/dL — ABNORMAL HIGH (ref <15)

## 2023-11-12 LAB — URINALYSIS, ROUTINE W REFLEX MICROSCOPIC
Bilirubin Urine: NEGATIVE
Glucose, UA: NEGATIVE mg/dL
Hgb urine dipstick: NEGATIVE
Ketones, ur: NEGATIVE mg/dL
Leukocytes,Ua: NEGATIVE
Nitrite: NEGATIVE
Protein, ur: NEGATIVE mg/dL
Specific Gravity, Urine: 1.019 (ref 1.005–1.030)
pH: 5 (ref 5.0–8.0)

## 2023-11-12 LAB — TROPONIN I (HIGH SENSITIVITY)
Troponin I (High Sensitivity): 4 ng/L (ref <18)
Troponin I (High Sensitivity): 4 ng/L (ref <18)

## 2023-11-12 LAB — URINE DRUG SCREEN, QUALITATIVE (ARMC ONLY)
Amphetamines, Ur Screen: NOT DETECTED
Barbiturates, Ur Screen: NOT DETECTED
Benzodiazepine, Ur Scrn: NOT DETECTED
Cannabinoid 50 Ng, Ur ~~LOC~~: POSITIVE — AB
Cocaine Metabolite,Ur ~~LOC~~: NOT DETECTED
MDMA (Ecstasy)Ur Screen: NOT DETECTED
Methadone Scn, Ur: NOT DETECTED
Opiate, Ur Screen: NOT DETECTED
Phencyclidine (PCP) Ur S: NOT DETECTED
Tricyclic, Ur Screen: NOT DETECTED

## 2023-11-12 LAB — BRAIN NATRIURETIC PEPTIDE: B Natriuretic Peptide: 6.2 pg/mL (ref 0.0–100.0)

## 2023-11-12 LAB — ACETAMINOPHEN LEVEL: Acetaminophen (Tylenol), Serum: <10 ug/mL — ABNORMAL LOW (ref 10–30)

## 2023-11-12 MED ORDER — ALBUTEROL SULFATE HFA 108 (90 BASE) MCG/ACT IN AERS
2.0000 | INHALATION_SPRAY | Freq: Once | RESPIRATORY_TRACT | Status: AC
  Administered 2023-11-12: 2 via RESPIRATORY_TRACT
  Filled 2023-11-12: qty 6.7

## 2023-11-12 MED ORDER — ONDANSETRON HCL 4 MG/2ML IJ SOLN
4.0000 mg | Freq: Once | INTRAMUSCULAR | Status: AC
  Administered 2023-11-12: 4 mg via INTRAVENOUS
  Filled 2023-11-12: qty 2

## 2023-11-12 MED ORDER — LACTATED RINGERS IV BOLUS
1000.0000 mL | Freq: Once | INTRAVENOUS | Status: AC
Start: 2023-11-12 — End: 2023-11-12
  Administered 2023-11-12: 1000 mL via INTRAVENOUS

## 2023-11-12 MED ORDER — PREDNISONE 20 MG PO TABS
60.0000 mg | ORAL_TABLET | Freq: Once | ORAL | Status: AC
Start: 2023-11-12 — End: 2023-11-12
  Administered 2023-11-12: 60 mg via ORAL
  Filled 2023-11-12: qty 3

## 2023-11-12 MED ORDER — ALBUTEROL SULFATE HFA 108 (90 BASE) MCG/ACT IN AERS
2.0000 | INHALATION_SPRAY | Freq: Four times a day (QID) | RESPIRATORY_TRACT | 2 refills | Status: DC | PRN
  Filled 2023-11-12: qty 13.4, fill #0

## 2023-11-12 MED ORDER — PREDNISONE 20 MG PO TABS
20.0000 mg | ORAL_TABLET | Freq: Two times a day (BID) | ORAL | 0 refills | Status: DC
  Filled 2023-11-12: qty 6, 3d supply, fill #0

## 2023-11-12 MED ORDER — PROPRANOLOL HCL 20 MG PO TABS
10.0000 mg | ORAL_TABLET | Freq: Once | ORAL | Status: AC
  Administered 2023-11-12: 10 mg via ORAL
  Filled 2023-11-12: qty 1

## 2023-11-12 MED ORDER — PREDNISONE 20 MG PO TABS: 20.0000 mg | ORAL_TABLET | Freq: Two times a day (BID) | ORAL | 0 refills | Status: AC

## 2023-11-12 MED ORDER — SODIUM CHLORIDE 0.9 % IV BOLUS
500.0000 mL | Freq: Once | INTRAVENOUS | Status: AC
  Administered 2023-11-12: 500 mL via INTRAVENOUS

## 2023-11-12 MED ORDER — ALBUTEROL SULFATE HFA 108 (90 BASE) MCG/ACT IN AERS: 2.0000 | INHALATION_SPRAY | Freq: Four times a day (QID) | RESPIRATORY_TRACT | 2 refills | Status: AC | PRN

## 2023-11-12 MED ORDER — IPRATROPIUM-ALBUTEROL 0.5-2.5 (3) MG/3ML IN SOLN
3.0000 mL | Freq: Once | RESPIRATORY_TRACT | Status: AC
  Administered 2023-11-12: 3 mL via RESPIRATORY_TRACT
  Filled 2023-11-12: qty 3

## 2023-11-12 MED ORDER — FAMOTIDINE IN NACL 20-0.9 MG/50ML-% IV SOLN
20.0000 mg | Freq: Once | INTRAVENOUS | Status: AC
  Administered 2023-11-12: 20 mg via INTRAVENOUS
  Filled 2023-11-12: qty 50

## 2023-11-12 NOTE — ED Triage Notes (Signed)
 Pt arrived via [facility] from home c/o chest pain since 1pm yesterday. Pt states pain worsened over the last hour.  Pt states he feels pressure, like someone punched him in the chest.  Pt claims no cardiac history.

## 2023-11-12 NOTE — ED Provider Notes (Signed)
 [facility] Provider Note    Event Date/Time   First MD Initiated Contact with Patient 11/12/23 [phone]     (approximate)   History   Chest Pain   HPI  John [name] is a 43 y.o. male  male with history of alcohol use disorder, substance-induced mood disorder and homelessness who presents to the emergency department via ambulance with 24 hours of chest pain.  Patient reports ongoing chest pain that feels as if someone punched him in the chest.  He does report drinking a copious amount of alcohol this morning.  He is states that he smokes over 2 packs of cigarettes daily.  Denies any cough fevers or chills abdominal pain.  Denies any SI or HI currently and states that mood is not his issue today.  He states that he has not taken his medications in a while and cannot give me a reason why      Physical Exam   Triage Vital Signs: ED Triage Vitals  Encounter Vitals Group     BP 11/12/23 0129 124/79     Girls Systolic BP Percentile --      Girls Diastolic BP Percentile --      Boys Systolic BP Percentile --      Boys Diastolic BP Percentile --      Pulse Rate 11/12/23 0129 (!) 103     Resp --      Temp 11/12/23 0129 98.5 F (36.9 C)     Temp Source 11/12/23 0129 Oral     SpO2 11/12/23 0129 95 %     Weight 11/12/23 0130 155 lb (70.3 kg)     Height 11/12/23 0130 5' 9 (1.753 m)     Head Circumference --      Peak Flow --      Pain Score 11/12/23 0130 8     Pain Loc --      Pain Education --      Exclude from Growth Chart --     Most recent vital signs: Vitals:   11/12/23 0630 11/12/23 0640  BP: 112/77   Pulse:    Resp: 17   Temp:    SpO2:  94%    Nursing Triage Note reviewed. Vital signs reviewed and patients oxygen saturation is normoxic  General: Patient is well nourished, well developed, awake and alert, resting comfortably in no acute distress Head: Normocephalic and atraumatic Eyes: Normal inspection, extraocular muscles intact, no  conjunctival pallor Ear, nose, throat: Normal external exam Neck: Normal range of motion Respiratory: Patient is in no respiratory distress, lungs with mild wheezes Cardiovascular: Patient is tachycardic, RRR without murmur appreciated GI: Abd SNT with no guarding or rebound  Back: Normal inspection of the back with good strength and range of motion throughout all ext Extremities: pulses intact with good cap refills, no LE pitting edema or calf tenderness Neuro: The patient is alert and oriented to person, place, and time, appropriately conversive, with 5/5 bilat UE/LE strength, no gross motor or sensory defects noted. Coordination appears to be adequate. Skin: Warm, dry, and intact Psych: normal mood and affect, no SI or HI  ED Results / Procedures / Treatments   Labs (all labs ordered are listed, but only abnormal results are displayed) Labs Reviewed  BASIC METABOLIC PANEL WITH GFR - Abnormal; Notable for the following components:      Result Value   Potassium 3.3 (*)    Calcium  8.2 (*)    All other components  within normal limits  ETHANOL - Abnormal; Notable for the following components:   Alcohol, Ethyl (B) 140 (*)    All other components within normal limits  ACETAMINOPHEN  LEVEL - Abnormal; Notable for the following components:   Acetaminophen  (Tylenol ), Serum <10 (*)    All other components within normal limits  SALICYLATE LEVEL - Abnormal; Notable for the following components:   Salicylate Lvl <7.0 (*)    All other components within normal limits  URINE DRUG SCREEN, QUALITATIVE ([facility] ONLY) - Abnormal; Notable for the following components:   Cannabinoid 50 Ng, Ur [place] POSITIVE (*)    All other components within normal limits  URINALYSIS, ROUTINE W REFLEX MICROSCOPIC - Abnormal; Notable for the following components:   Color, Urine YELLOW (*)    APPearance CLEAR (*)    All other components within normal limits  CBC WITH DIFFERENTIAL/PLATELET  BRAIN NATRIURETIC PEPTIDE   TROPONIN I (HIGH SENSITIVITY)  TROPONIN I (HIGH SENSITIVITY)     EKG EKG and rhythm strip are interpreted by myself:   EKG: [Normal sinus rhythm] at heart rate of 100, normal QRS duration, QTc 438, normal ST segments and T waves no ectopy EKG not consistent with Acute STEMI Rhythm strip: NSR in lead II   RADIOLOGY Xray chest: No acute abnormality on my independent review interpretation radiologist reads this as chronic scarring    PROCEDURES:  Critical Care performed: No  Procedures   MEDICATIONS ORDERED IN ED: Medications  famotidine  (PEPCID ) IVPB 20 mg premix (0 mg Intravenous Stopped 11/12/23 0252)  ondansetron  (ZOFRAN ) injection 4 mg (4 mg Intravenous Given 11/12/23 0214)  lactated ringers  bolus 1,000 mL (0 mLs Intravenous Stopped 11/12/23 0315)  sodium chloride  0.9 % bolus 500 mL (0 mLs Intravenous Stopped 11/12/23 0440)  ipratropium-albuterol  (DUONEB) 0.5-2.5 (3) MG/3ML nebulizer solution 3 mL (3 mLs Nebulization Given 11/12/23 0341)  propranolol  (INDERAL ) tablet 10 mg (10 mg Oral Given 11/12/23 0524)  ipratropium-albuterol  (DUONEB) 0.5-2.5 (3) MG/3ML nebulizer solution 3 mL (3 mLs Nebulization Given 11/12/23 0524)  predniSONE  (DELTASONE ) tablet 60 mg (60 mg Oral Given 11/12/23 0524)  albuterol  (VENTOLIN  HFA) 108 (90 Base) MCG/ACT inhaler 2 puff (2 puffs Inhalation Given 11/12/23 0656)     IMPRESSION / MDM / ASSESSMENT AND PLAN / ED COURSE                                Differential diagnosis includes, but is not limited to, ACS, gastritis, bronchospasm, anemia, electrolyte derangement   ED course: Patient presents and he is well-appearing.  Initial EKG demonstrated no evidence of acute ischemia.  Troponin x 2 was not elevated.  Chest x-ray was unremarkable.  Patient's alcohol level was elevated and presentation is consistent with alcohol intoxication; initial exam was not consistent with any withdrawal.  While he was sleeping patient did drop his oxygen level (was not  hypoxic on arrival) and repeat pulmonary exams were consistent with increasing bronchospasm.  I did consider all pulmonary embolism as a source however I think it is much more likely that bronchospasm given patient's smoking history combined with alcohol intoxication and his sleeping is more likely for symptoms.  Patient is receiving 2 DuoNebs, Zofran  Pepcid  a bolus of LR.  At this time he does not want psychiatric assessment and does not endorse any SI HI albeit there is a chance he may transition upon discharge.  Disposition is currently pending clinical soberization and reassessment   Clinical Course as  of 11/12/23 0701  Thu Nov 12, 2023  0317 Troponin I (High Sensitivity): 4 Not elevated [HD]  0317 Alcohol, Ethyl (B)(!): 140 Mildly elevated [HD]  0317 Basic metabolic panel(!) No acute abnormality [HD]  0329 SpO2(!): 86 % [HD]  0334 I noticed patient desat so I went to reevaluate him he is sleeping comfortably.  I woke him and his oxygen level improved to 93%.  Placed on oxygen while sleeping but will order a DuoNeb.  Chest x-ray still pending [HD]  0503 Troponin I (High Sensitivity): 4 Second troponin not elevated [HD]  0503 DG Chest 2 View Demonstrates only chronic findings with no evidence of pneumonia [HD]  0511 Patient resting comfortably sleeping, repeat pulmonary exam does demonstrate still little bit of bronchospasm.  Will initiate some steroids [HD]  0614 B Natriuretic Peptide: 6.2 BNP not elevated [HD]  0626 Patient awake and alert and taking p.o.  Weaned to room air.  Denies any SI HI or AVH S.  Feels comfortable returning home [HD]    Clinical Course User Index [HD] Nicholaus Rolland BRAVO, MD   At time of discharge there is no evidence of acute life, limb, vision, or fertility threat. Patient has stable vital signs, pain is well controlled, patient is ambulatory and p.o. tolerant.  Discharge instructions were completed using the EPIC system. I would refer you to those at this  time. All warnings prescriptions follow-up etc. were discussed in detail with the patient. Patient indicates understanding and is agreeable with this plan. All questions answered.  Patient is made aware that they may return to the emergency department for any worsening or new condition or for any other emergency.  -- Risk: 5 This patient has a high risk of morbidity due to further diagnostic testing or treatment. Rationale: This patient's evaluation and management involve a high risk of morbidity due to the potential severity of presenting symptoms, need for diagnostic testing, and/or initiation of treatment that may require close monitoring. The differential includes conditions with potential for significant deterioration or requiring escalation of care. Treatment decisions in the ED, including medication administration, procedural interventions, or disposition planning, reflect this level of risk. COPA: 5 The patient has the following acute or chronic illness/injury that poses a possible threat to life or bodily function: [X] : The patient has a potentially serious acute condition or an acute exacerbation of a chronic illness requiring urgent evaluation and management in the Emergency Department. The clinical presentation necessitates immediate consideration of life-threatening or function-threatening diagnoses, even if they are ultimately ruled out.   FINAL CLINICAL IMPRESSION(S) / ED DIAGNOSES   Final diagnoses:  Alcoholic intoxication with complication (HCC)  Chest pain, unspecified type  Bronchospasm     Rx / DC Orders   ED Discharge Orders          Ordered    predniSONE  (DELTASONE ) 20 MG tablet  2 times daily with meals,   Status:  Discontinued        11/12/23 0517    albuterol  (VENTOLIN  HFA) 108 (90 Base) MCG/ACT inhaler  Every 6 hours PRN,   Status:  Discontinued        11/12/23 0517    Ambulatory referral to Cardiology       Comments: If you have not heard from the Cardiology  office within the next 72 hours please call [phone].   11/12/23 0542    Ambulatory Referral to Primary Care (Establish Care)        11/12/23 0542    predniSONE  (  DELTASONE ) 20 MG tablet  2 times daily with meals        11/12/23 0627    albuterol  (VENTOLIN  HFA) 108 (90 Base) MCG/ACT inhaler  Every 6 hours PRN        11/12/23 [id number]             Note:  This document was prepared using Dragon voice recognition software and may include unintentional dictation errors.   Nicholaus Rolland BRAVO, MD 11/12/23 [phone]

## 2023-11-12 NOTE — Discharge Instructions (Signed)
 You was seen in the emergency department for chest pain and shortness of breath in the setting of alcohol intoxication and ongoing cigarette smoking.  Your workup today was reassuring but I have placed outpatient referrals for you to primary care physician and cardiology.  Please establish a primary care physician as soon as possible.  Use your albuterol  inhaler for the next 48 hours every 4-6 hours.  Your next dose of steroids will be due this evening.  Return if any acutely worsening symptoms or any other emergency. -- RETURN PRECAUTIONS & AFTERCARE: (ENGLISH) RETURN PRECAUTIONS: Return immediately to the emergency department or see/call your doctor if you feel worse, weak or have changes in speech or vision, are short of breath, have fever, vomiting, pain, bleeding or dark stool, trouble urinating or any new issues. Return here or see/call your doctor if not improving as expected for your suspected condition. FOLLOW-UP CARE: Call your doctor and/or any doctors we referred you to for more advice and to make an appointment. Do this today, tomorrow or after the weekend. Some doctors only take PPO insurance so if you have HMO insurance you may want to contact your HMO or your regular doctor for referral to a specialist within your plan. Either way tell the doctor's office that it was a referral from the emergency department so you get the soonest possible appointment.  YOUR TEST RESULTS: Take result reports of any blood or urine tests, imaging tests and EKG's to your doctor and any referral doctor. Have any abnormal tests repeated. Your doctor or a referral doctor can let you know when this should be done. Also make sure your doctor contacts this hospital to get any test results that are not currently available such as cultures or special tests for infection and final imaging reports, which are often not available at the time you leave the ER but which may list additional important findings that are not  documented on the preliminary report. BLOOD PRESSURE: If your blood pressure was greater than 120/80 have your blood pressure rechecked within 1 to 2 weeks. MEDICATION SIDE EFFECTS: Do not drive, walk, bike, take the bus, etc. if you have received or are being prescribed any sedating medications such as those for pain or anxiety or certain antihistamines like Benadryl . If you have been give one of these here get a taxi home or have a friend drive you home. Ask your pharmacist to counsel you on potential side effects of any new medication

## 2023-11-13 ENCOUNTER — Emergency Department
Admission: EM | Admit: 2023-11-13 | Discharge: 2023-11-13 | Discharge status: Against Medical Advice | Payer: MEDICAID | Attending: Emergency Medicine | Admitting: Emergency Medicine

## 2023-11-13 ENCOUNTER — Other Ambulatory Visit: Payer: Self-pay

## 2023-11-13 DIAGNOSIS — F10239 Alcohol dependence with withdrawal, unspecified: Secondary | ICD-10-CM | POA: Diagnosis present

## 2023-11-13 DIAGNOSIS — R45851 Suicidal ideations: Secondary | ICD-10-CM | POA: Diagnosis not present

## 2023-11-13 DIAGNOSIS — Z5321 Procedure and treatment not carried out due to patient leaving prior to being seen by health care provider: Secondary | ICD-10-CM | POA: Insufficient documentation

## 2023-11-13 NOTE — ED Triage Notes (Addendum)
 Pt comes with [employer] with c/o detox. Pt denies any SI but [employer] states pt called them voicing that and they took him to [facility] but he is intoxicated   Pt is yelling and cussing to officer. Pt states he doesn't want to be here. This RN told pt he could leave if he didn't want help. Pt states fuck I will stay. Pt instructed to be calm and cooperative with staff.   Pt states he drinks 3 40oz beers daily. Pt states no drug use.

## 2023-11-13 NOTE — ED Notes (Signed)
 Pt states he is leaving and isnt getting locked up; [employer] escorted pt out of hospital and off the property.

## 2023-11-13 NOTE — ED Triage Notes (Signed)
 Pt to ED with [employer] voluntarily SI and detox.

## 2023-11-20 ENCOUNTER — Other Ambulatory Visit: Payer: Self-pay

## 2023-11-20 ENCOUNTER — Emergency Department
Admission: EM | Admit: 2023-11-20 | Discharge: 2023-11-21 | Disposition: A | Discharge status: Home / Self Care | Payer: MEDICAID | Attending: Emergency Medicine | Admitting: Emergency Medicine

## 2023-11-20 DIAGNOSIS — R4781 Slurred speech: Secondary | ICD-10-CM | POA: Insufficient documentation

## 2023-11-20 DIAGNOSIS — F1010 Alcohol abuse, uncomplicated: Secondary | ICD-10-CM | POA: Diagnosis present

## 2023-11-20 DIAGNOSIS — Y906 Blood alcohol level of 120-199 mg/100 ml: Secondary | ICD-10-CM | POA: Diagnosis not present

## 2023-11-20 DIAGNOSIS — R45851 Suicidal ideations: Secondary | ICD-10-CM | POA: Diagnosis not present

## 2023-11-20 LAB — CBC WITH DIFFERENTIAL/PLATELET
Abs Immature Granulocytes: 0.03 K/uL (ref 0.00–0.07)
Basophils Absolute: 0.1 K/uL (ref 0.0–0.1)
Basophils Relative: 1 %
Eosinophils Absolute: 0.1 K/uL (ref 0.0–0.5)
Eosinophils Relative: 1 %
HCT: 42.5 % (ref 39.0–52.0)
Hemoglobin: 13.9 g/dL (ref 13.0–17.0)
Immature Granulocytes: 0 %
Lymphocytes Relative: 34 %
Lymphs Abs: 2.7 K/uL (ref 0.7–4.0)
MCH: 29.7 pg (ref 26.0–34.0)
MCHC: 32.7 g/dL (ref 30.0–36.0)
MCV: 90.8 fL (ref 80.0–100.0)
Monocytes Absolute: 0.5 K/uL (ref 0.1–1.0)
Monocytes Relative: 7 %
Neutro Abs: 4.6 K/uL (ref 1.7–7.7)
Neutrophils Relative %: 57 %
Platelets: 284 K/uL (ref 150–400)
RBC: 4.68 MIL/uL (ref 4.22–5.81)
RDW: 15.1 % (ref 11.5–15.5)
WBC: 7.9 K/uL (ref 4.0–10.5)
nRBC: 0 % (ref 0.0–0.2)

## 2023-11-20 LAB — COMPREHENSIVE METABOLIC PANEL WITH GFR
ALT: 20 U/L (ref 0–44)
AST: 24 U/L (ref 15–41)
Albumin: 3.7 g/dL (ref 3.5–5.0)
Alkaline Phosphatase: 55 U/L (ref 38–126)
Anion gap: 12 (ref 5–15)
BUN: 6 mg/dL (ref 6–20)
CO2: 22 mmol/L (ref 22–32)
Calcium: 8.5 mg/dL — ABNORMAL LOW (ref 8.9–10.3)
Chloride: 105 mmol/L (ref 98–111)
Creatinine, Ser: 0.65 mg/dL (ref 0.61–1.24)
GFR, Estimated: >60 mL/min (ref >60)
Glucose, Bld: 99 mg/dL (ref 70–99)
Potassium: 3.4 mmol/L — ABNORMAL LOW (ref 3.5–5.1)
Sodium: 139 mmol/L (ref 135–145)
Total Bilirubin: 0.4 mg/dL (ref 0.0–1.2)
Total Protein: 8 g/dL (ref 6.5–8.1)

## 2023-11-20 LAB — ETHANOL: Alcohol, Ethyl (B): 159 mg/dL — ABNORMAL HIGH (ref <15)

## 2023-11-20 NOTE — ED Triage Notes (Signed)
 Pt reports I am suicidal I just want to end it Pt admits to be drinking alcohol. Pt denies any HI. Pt reports seeing things, denies hearing things. Pt reports he drinks every day, reports last drink was 4 pm. Pt reports last time he used Meth was last week.

## 2023-11-20 NOTE — ED Triage Notes (Signed)
 First nurse note:  BIB EMS c/o SOB.  ETOH on board with 3-4 40oz beers today.  Pt ambulatory and in NAD.  Upon arrival to lobby with EMS patient walked outside to smoke.  90/60 BP, 94 HR, 97% RA

## 2023-11-21 MED ORDER — ALBUTEROL SULFATE (2.5 MG/3ML) 0.083% IN NEBU: 2.5000 mg | INHALATION_SOLUTION | Freq: Four times a day (QID) | RESPIRATORY_TRACT | Status: DC | PRN

## 2023-11-21 MED ORDER — ACETAMINOPHEN 500 MG PO TABS
1000.0000 mg | ORAL_TABLET | Freq: Four times a day (QID) | ORAL | Status: DC | PRN
  Administered 2023-11-21: 1000 mg via ORAL
  Filled 2023-11-21: qty 2

## 2023-11-21 MED ORDER — THIAMINE MONONITRATE 100 MG PO TABS
100.0000 mg | ORAL_TABLET | Freq: Every day | ORAL | Status: DC
  Administered 2023-11-21: 100 mg via ORAL
  Filled 2023-11-21: qty 1

## 2023-11-21 MED ORDER — THIAMINE HCL 100 MG/ML IJ SOLN
100.0000 mg | Freq: Every day | INTRAMUSCULAR | Status: DC
  Filled 2023-11-21: qty 1

## 2023-11-21 MED ORDER — FOLIC ACID 1 MG PO TABS
1.0000 mg | ORAL_TABLET | Freq: Every day | ORAL | Status: DC
Start: 2023-11-21 — End: 2023-11-21
  Administered 2023-11-21: 1 mg via ORAL
  Filled 2023-11-21: qty 1

## 2023-11-21 MED ORDER — LORAZEPAM 2 MG/ML IJ SOLN: 1.0000 mg | INTRAMUSCULAR | Status: DC | PRN

## 2023-11-21 MED ORDER — ADULT MULTIVITAMIN W/MINERALS CH
1.0000 | ORAL_TABLET | Freq: Every day | ORAL | Status: DC
Start: 2023-11-21 — End: 2023-11-21
  Administered 2023-11-21: 1 via ORAL
  Filled 2023-11-21: qty 1

## 2023-11-21 MED ORDER — LORAZEPAM 1 MG PO TABS: 1.0000 mg | ORAL_TABLET | ORAL | Status: DC | PRN

## 2023-11-21 NOTE — ED Notes (Signed)
 Pt appears agitated and stood outside nurses station door demanding his paperwork for discharge. Pt states that doctor said that I was going home and would leave by 2 and it doesn't take that long to do some damn paperwork. Pt reassured.

## 2023-11-21 NOTE — ED Notes (Signed)
Breakfast provided with juice.

## 2023-11-21 NOTE — ED Notes (Signed)
 Pt sleeping peacefully, no signs of withdrawal.

## 2023-11-21 NOTE — ED Notes (Signed)
 Pt given bags of belongings and his dinner tray. Discharge instructions reviewed. Pt verbalizes understanding and denies any needs.

## 2023-11-21 NOTE — ED Notes (Signed)
 Pt to nurses station door. Pt very agitated yelling and cursing because he has not be discharged yet. Pt continues to become increasingly agitated. Psych NP was notified via secure chat.

## 2023-11-21 NOTE — ED Notes (Signed)
 Fruit cup and water provided for snack

## 2023-11-21 NOTE — ED Notes (Signed)
 Pt asking where his discharge papers are so he can leave. Pt states he was told he was going home today and wants to leave now. Pt reassured the provider was working on his discharge and this nurse would let him know ASAP when that would happen.

## 2023-11-21 NOTE — ED Notes (Signed)
 Pt given phone to call for a ride home; pt pending discharge per provider

## 2023-11-21 NOTE — ED Notes (Signed)
Snacks given to pt.

## 2023-11-21 NOTE — ED Notes (Signed)
 IVC/ pending psych consult/assessment

## 2023-11-21 NOTE — ED Provider Notes (Signed)
 [facility] Provider Note    Event Date/Time   First MD Initiated Contact with Patient 11/21/23 0028     (approximate)   History   Psychiatric Evaluation   HPI  John [name] is a 43 y.o. male alcohol use disorder and substance-induced mood disorder  Patient reports he was drinking alcohol tonight.  He decided that he is suicidal.  He does not go into great detail but he gestures as though he has a plan to shoot himself.  He reports suicidal ideation.  Denies any recent illness.  No pain or discomfort.  Reports alcohol use tonight.  Currently his only request is to have something to eat and drink.  Diet has been ordered     Physical Exam   Triage Vital Signs: ED Triage Vitals  Encounter Vitals Group     BP 11/20/23 2046 104/73     Girls Systolic BP Percentile --      Girls Diastolic BP Percentile --      Boys Systolic BP Percentile --      Boys Diastolic BP Percentile --      Pulse Rate 11/20/23 2046 97     Resp 11/20/23 2046 16     Temp 11/20/23 2046 97.7 F (36.5 C)     Temp Source 11/20/23 2046 Oral     SpO2 11/20/23 2046 95 %     Weight 11/20/23 2049 155 lb (70.3 kg)     Height 11/20/23 2049 5' 9 (1.753 m)     Head Circumference --      Peak Flow --      Pain Score 11/20/23 2049 8     Pain Loc --      Pain Education --      Exclude from Growth Chart --     Most recent vital signs: Vitals:   11/20/23 2046  BP: 104/73  Pulse: 97  Resp: 16  Temp: 97.7 F (36.5 C)  SpO2: 95%     General: Awake, no distress.  Resting without distress laying on his side pleasant but reports active suicidal ideations indicating by gesturing that he may want to shoot himself CV:  Good peripheral perfusion.   Resp:  Normal effort.   Abd:  No distention.  Other:  Alert oriented.  Slight slurring of speech.  Conjunctiva mildly injected.  Does not appear disoriented or to be responding to external stimuli.  He is calm and is not agitated   ED  Results / Procedures / Treatments   Labs (all labs ordered are listed, but only abnormal results are displayed) Labs Reviewed  COMPREHENSIVE METABOLIC PANEL WITH GFR - Abnormal; Notable for the following components:      Result Value   Potassium 3.4 (*)    Calcium  8.5 (*)    All other components within normal limits  ETHANOL - Abnormal; Notable for the following components:   Alcohol, Ethyl (B) 159 (*)    All other components within normal limits  CBC WITH DIFFERENTIAL/PLATELET  URINE DRUG SCREEN, QUALITATIVE ([facility] ONLY)     EKG     RADIOLOGY     PROCEDURES:  Critical Care performed: No  Procedures   MEDICATIONS ORDERED IN ED: Medications  LORazepam  (ATIVAN ) tablet 1-4 mg (has no administration in time range)    Or  LORazepam  (ATIVAN ) injection 1-4 mg (has no administration in time range)  thiamine  (VITAMIN B1) tablet 100 mg (has no administration in time range)    Or  thiamine  (VITAMIN B1) injection 100 mg (has no administration in time range)  folic acid  (FOLVITE ) tablet 1 mg (has no administration in time range)  multivitamin with minerals tablet 1 tablet (has no administration in time range)  albuterol  (VENTOLIN  HFA) 108 (90 Base) MCG/ACT inhaler 2 puff (has no administration in time range)     IMPRESSION / MDM / ASSESSMENT AND PLAN / ED COURSE  I reviewed the triage vital signs and the nursing notes.                              Differential diagnosis includes, but is not limited to, history of substance abuse, alcohol use presenting today reporting alcohol use and desire to commit suicide.  He denies any overdose or ingestion but indicates a plan to potentially shoot himself.  Will place under involuntary commitment.  Medical screening labs reassuring.  Vital signs reassuring.  Resting comfortably requesting diet without acute distress  Patient's presentation is most consistent with acute complicated illness / injury requiring diagnostic workup.   No  evidence or history to suggest acute medical cause of presentation rather patient self reporting alcohol use and suicidal ideation.  The patient has been placed in psychiatric observation due to the need to provide a safe environment for the patient while obtaining psychiatric consultation and evaluation, as well as ongoing medical and medication management to treat the patient's condition.  The patient has been placed under full IVC at this time.  Ongoing care signed to Dr. Jacolyn LYELL initiated.      FINAL CLINICAL IMPRESSION(S) / ED DIAGNOSES   Final diagnoses:  Alcohol abuse  Suicidal ideation     Rx / DC Orders   ED Discharge Orders     None        Note:  This document was prepared using Dragon voice recognition software and may include unintentional dictation errors.   Dicky Anes, MD 11/21/23 [phone]

## 2023-11-21 NOTE — Consult Note (Signed)
 [facility] Psychiatric Consult Initial  Patient Name: .John [name]  MRN: [id number]  DOB: May 29, 1980  Consult Order details:  Orders (From admission, onward)     Start     Ordered   11/21/23 0046  CONSULT TO CALL ACT TEAM       Ordering Provider: Dicky Anes, MD  Provider:  (Not yet assigned)  Question:  Reason for Consult?  Answer:  Psych consult   11/21/23 0046   11/21/23 0046  IP CONSULT TO PSYCHIATRY       Ordering Provider: Dicky Anes, MD  Provider:  (Not yet assigned)  Question Answer Comment  Reason for consult: Other (see comments)   Comments: ivc      11/21/23 0046             Mode of Visit: In person    Psychiatry Consult Evaluation  Service Date: November 21, 2023 LOS:  LOS: 0 days  Chief Complaint Suicidal Ideation  Primary Psychiatric Diagnoses  Suicidal ideation 2.  Alcohol abuse   Assessment  John [name] is a 43 y.o. male admitted: Presented to the EDfor 11/20/2023  9:36 PM endorsing suicidal ideations, alcohol abuse with auditory and visual hallucinations.   Per ED provider note: John [name] is a 43 y.o. male alcohol use disorder and substance-induced mood disorder. Patient reports he was drinking alcohol tonight.  He decided that he is suicidal.  He does not go into great detail but he gestures as though he has a plan to shoot himself.  Diagnoses:  Active Hospital problems: Active Problems:   Suicidal ideations and Alcohol iintoxication  Plan   ## Psychiatric Medication Recommendations:  -Continue prescribed home medications as directed   ## Medical Decision Making Capacity: Not specifically addressed in this encounter  ## Further Work-up:  -- Pertinent labwork reviewed earlier this admission includes: CMP, CBC, Ethanol- 159, UDS   ## Disposition:-- There are no psychiatric contraindications to discharge at this time  ## Behavioral / Environmental: - No specific recommendations at this time.     ## Safety and Observation  Level:  - Based on my clinical evaluation, I estimate the patient to be at  low risk of self harm in the current setting. - At this time, we recommend  routine. This decision is based on my review of the chart including patient's history and current presentation, interview of the patient, mental status examination, and consideration of suicide risk including evaluating suicidal ideation, plan, intent, suicidal or self-harm behaviors, risk factors, and protective factors. This judgment is based on our ability to directly address suicide risk, implement suicide prevention strategies, and develop a safety plan while the patient is in the clinical setting. Please contact our team if there is a concern that risk level has changed.  CSSR Risk Category:C-SSRS RISK CATEGORY: Error: Q3, 4, or 5 should not be populated when Q2 is No  Suicide Risk Assessment: Patient has following modifiable risk factors for suicide: under treated depression , medication noncompliance, triggering events, and recent psychiatric hospitalization, homelessness, which we are addressing by utilizing therapeutic conversation with patient about events leading up to ED presentation. Patient has following non-modifiable or demographic risk factors for suicide: male gender, history of self harm behavior, and psychiatric hospitalization, suicide attempt Patient has the following protective factors against suicide: Access to outpatient mental health care  Thank you for this consult request. Recommendations have been communicated to the primary team.  We will sign off at this time.   Jaydah Stahle  JINNY Mountain, NP       History of Present Illness  Relevant Aspects of Hospital ED Course:   Patient is a 43 year-old, male with history of alcohol use disorder, substance-induced mood disorder and homelessness who presented to the ED endorsing suicidal ideation,alcohol intoxication with visual and auditory hallucinations. Patient was discharged from  [facility] approximately two weeks ago. He was admitted for alcohol detoxification and crisis stabilization. Patient reports he has been non-compliant with his current medications. Patient reports that he currently takes Naltrexone , Trazodone , and Gabapentin . He states that he is going to seek substance abuse treatment at [facility] and psychotherapy when he returns back to his psychiatry appointment on October 2. Patient is endorsing feeling better since detoxing from alcohol and getting some sleep.  Patient was strongly encouraged to maintain adherence to his current medication regimen.  He denies suicidal or homicidal ideations, or perceptual disturbances at this time. Patient is appropriate for discharge today and his IVC petition is recommended to be rescinded.  Psych ROS:  Depression: moderate, non-compliant with medication Anxiety:  moderate, non-compliant with medication Mania (lifetime and current): lifetime, episodic secondary to substance abuse Psychosis: (lifetime and current): lifetime, episodic secondary to substance abuse  Collateral information:  Patient declined to provide any family contacts for collateral information.    Psychiatric and Social History  Psychiatric History:   Information collected from the patient and review of chart, [facility] admission.  Prev Dx/Sx: Bipolar disorder, Substance-induced mood disorder, Alcohol use disorder, Polysubstance abuse Current Psych Provider: [facility] Meds (current): As mentioned above in HPI  Previous Med Trials: Unknown Therapy: Denies  Prior Psych Hospitalization: Multiple, most recent at [facility] Prior Self Harm: Admits to 1 suicide attempt prior  Violence: Denies  Family Psych History: Unknown Family Hx suicide: Unknown  Social History:  Educational Hx: Unknown Occupational Hx: Unemployed Legal Hx: Unknown Living Situation: Homeless Spiritual Hx: Unknown Access to weapons/lethal means: Denies  Substance  History Alcohol: Current excessive use Type of alcohol liquor Last Drink yesterday Number of drinks per day unknown History of alcohol withdrawal seizures denies History of DT's denies Tobacco: Occasional use Illicit drugs: History of meth Prescription drug abuse: Denies Rehab hx: Unknown  Exam Findings  Physical Exam: Deferred to EDP- note reviewed   Vital Signs:  Temp:  [97.7 F (36.5 C)-98.1 F (36.7 C)] 98.1 F (36.7 C) (09/27 0948) Pulse Rate:  [97] 97 (09/27 0948) Resp:  [16-18] 18 (09/27 0948) BP: (104-141)/(73-87) 141/87 (09/27 0948) SpO2:  [93 %-95 %] 93 % (09/27 0948) Weight:  [70.3 kg] 70.3 kg (09/26 2049) Blood pressure (!) 141/87, pulse 97, temperature 98.1 F (36.7 C), temperature source Oral, resp. rate 18, height 5' 9 (1.753 m), weight 70.3 kg, SpO2 93%. Body mass index is 22.89 kg/m.  Physical Exam  Mental Status Exam: General Appearance: Disheveled and Fairly Groomed  Orientation:  Full (Time, Place, and Person)  Memory:  Immediate;   Good Recent;   Good Remote;   Good  Concentration:  Concentration: Good and Attention Span: Good  Recall:  Fair  Attention  Good  Eye Contact:  Good  Speech:  Clear and Coherent and Normal Rate  Language:  Good  Volume:  Normal  Mood: good  Affect:  Appropriate and Congruent  Thought Process:  Coherent, Goal Directed, and Linear  Thought Content:  Logical  Suicidal Thoughts:  No  Homicidal Thoughts:  No  Judgement:  Fair  Insight:  Lacking  Psychomotor Activity:  Normal  Akathisia:  Negative  Fund of Knowledge:  Negative      Assets:  Manufacturing systems engineer Physical Health  Cognition:  WNL  ADL's:  Intact  AIMS (if indicated):        Other History   These have been pulled in through the EMR, reviewed, and updated if appropriate.  Family History:  The patient's family history is not on file.  Medical History: Past Medical History:  Diagnosis Date   ASD (atrial septal defect)    Bacterial infection  due to H. pylori    Hypertension    Kidney stones     Surgical History: Past Surgical History:  Procedure Laterality Date   ASD REPAIR     FOOT SURGERY Right    KNEE SURGERY Right      Medications:   Current Facility-Administered Medications:    acetaminophen  (TYLENOL ) tablet 1,000 mg, 1,000 mg, Oral, Q6H PRN, Claudene Rover, MD, 1,000 mg at 11/21/23 0950   albuterol  (PROVENTIL ) (2.5 MG/3ML) 0.083% nebulizer solution 2.5 mg, 2.5 mg, Inhalation, Q6H PRN, Dicky Anes, MD   folic acid  (FOLVITE ) tablet 1 mg, 1 mg, Oral, Daily, Quale, Mark, MD, 1 mg at 11/21/23 9051   LORazepam  (ATIVAN ) tablet 1-4 mg, 1-4 mg, Oral, Q1H PRN **OR** LORazepam  (ATIVAN ) injection 1-4 mg, 1-4 mg, Intravenous, Q1H PRN, Dicky Anes, MD   multivitamin with minerals tablet 1 tablet, 1 tablet, Oral, Daily, Quale, Mark, MD, 1 tablet at 11/21/23 9051   thiamine  (VITAMIN B1) tablet 100 mg, 100 mg, Oral, Daily, 100 mg at 11/21/23 0948 **OR** thiamine  (VITAMIN B1) injection 100 mg, 100 mg, Intravenous, Daily, Dicky Anes, MD  Current Outpatient Medications:    acetaminophen  (TYLENOL ) 325 MG tablet, Take 325 mg by mouth every 6 (six) hours as needed., Disp: , Rfl:    albuterol  (VENTOLIN  HFA) 108 (90 Base) MCG/ACT inhaler, Inhale 2 puffs into the lungs every 6 (six) hours as needed for wheezing or shortness of breath., Disp: 8 g, Rfl: 2   gabapentin  (NEURONTIN ) 300 MG capsule, Take 1 capsule (300 mg total) by mouth every 8 (eight) hours., Disp: 90 capsule, Rfl: 0   ibuprofen  (ADVIL ) 400 MG tablet, Take 400 mg by mouth every 6 (six) hours as needed., Disp: , Rfl:    naltrexone  (DEPADE) 50 MG tablet, Take 1 tablet (50 mg total) by mouth daily., Disp: 30 tablet, Rfl: 0   traZODone  (DESYREL ) 100 MG tablet, Take 1 tablet (100 mg total) by mouth at bedtime as needed for sleep., Disp: 14 tablet, Rfl: 0   nicotine  (NICODERM CQ  - DOSED IN MG/24 HOURS) 14 mg/24hr patch, Place 1 patch (14 mg total) onto the skin daily., Disp: , Rfl:     thiamine  (VITAMIN B-1) 100 MG tablet, Take 1 tablet (100 mg total) by mouth daily., Disp: 14 tablet, Rfl: 0  Allergies: Allergies  Allergen Reactions   Penicillins Other (See Comments)    Unknown     Camelia JINNY Mountain, NP

## 2023-11-21 NOTE — ED Provider Notes (Signed)
 Emergency Medicine Observation Re-evaluation Note  John [name] is a 43 y.o. male, seen on rounds today.  Pt initially presented to the ED for complaints of Psychiatric Evaluation Currently, the patient is resting.  Physical Exam  BP 104/73 (BP Location: Left Arm)   Pulse 97   Temp 97.7 F (36.5 C) (Oral)   Resp 16   Ht 5' 9 (1.753 m)   Wt 70.3 kg   SpO2 95%   BMI 22.89 kg/m   General: No distress   ED Course / MDM  EKG:   I have reviewed the labs performed to date as well as medications administered while in observation.  Recent changes in the last 24 hours include IVC, moved to BHU. Awaiting psych eval.  Plan  Current plan is for psych eval.     Claudene Rover, MD 11/21/23 [phone]

## 2023-11-21 NOTE — ED Notes (Signed)
John [name] at the bedside for pt evaluation  

## 2023-11-21 NOTE — ED Notes (Signed)
 Pt using the phone at this time. Pt informed we are working on getting him discharged. Pt verbalized understanding and states that he is just upset because he was told that he would be discharged by 2pm and he is still waiting. Pt states that he has things to take care of before the rain starts. Pt informed they are working on getting his paper work taken care of at this time. Pt verbalized understanding.

## 2023-11-21 NOTE — BH Assessment (Incomplete)
 Comprehensive Clinical Assessment (CCA) Screening, Triage and Referral Note  11/21/2023 John [name] [id number]  Chief Complaint:  Chief Complaint  Patient presents with   Psychiatric Evaluation   Visit Diagnosis: ***  Patient Reported Information How did you hear about us ? Self  What Is the Reason for Your Visit/Call Today? Came to the ER due to SI from being homeless and drinking alcohol.  How Long Has This Been Causing You Problems? 1 wk - 1 month  What Do You Feel Would Help You the Most Today? Treatment for Depression or other mood problem; Alcohol or Drug Use Treatment   Have You Recently Had Any Thoughts About Hurting Yourself? Yes  Are You Planning to Commit Suicide/Harm Yourself At This time? No   Have you Recently Had Thoughts About Hurting Someone Sherral? No  Are You Planning to Harm Someone at This Time? No  Explanation: No data recorded  Have You Used Any Alcohol or Drugs in the Past 24 Hours? Yes  How Long Ago Did You Use Drugs or Alcohol? 08/05/23  What Did You Use and How Much? Alcohol   Do You Currently Have a Therapist/Psychiatrist? No  Name of Therapist/Psychiatrist: No data recorded  Have You Been Recently Discharged From Any Office Practice or Programs? No  Explanation of Discharge From Practice/Program: No data recorded   CCA Screening Triage Referral Assessment Type of Contact: Face-to-Face  Telemedicine Service Delivery:   Is this Initial or Reassessment?   Date Telepsych consult ordered in [facility]:    Time Telepsych consult ordered in [facility]:    Location of Assessment: [facility] ED  Provider Location: [facility] ED    Collateral Involvement: No data recorded  Does Patient Have a Court Appointed Legal Guardian? No data recorded Name and Contact of Legal Guardian: No data recorded If Minor and Not Living with Parent(s), Who has Custody? No data recorded Is CPS involved or ever been involved? Never  Is APS involved or ever been involved?  Never   Patient Determined To Be At Risk for Harm To Self or Others Based on Review of Patient Reported Information or Presenting Complaint? No  Method: No data recorded Availability of Means: No data recorded Intent: No data recorded Notification Required: No data recorded Additional Information for Danger to Others Potential: No data recorded Additional Comments for Danger to Others Potential: No data recorded Are There Guns or Other Weapons in Your Home? No  Types of Guns/Weapons: No data recorded Are These Weapons Safely Secured?                            No  Who Could Verify You Are Able To Have These Secured: No data recorded Do You Have any Outstanding Charges, Pending Court Dates, Parole/Probation? No data recorded Contacted To Inform of Risk of Harm To Self or Others: No data recorded  Does Patient Present under Involuntary Commitment? Yes    [place] of Residence: [place]   Patient Currently Receiving the Following Services: Medication Management   Determination of Need: Emergent (2 hours)   Options For Referral: Medication Management; ED Visit   Disposition Recommendation per psychiatric provider: {CHLmaccldispo:[id number]}  Patrcia Kiki Jakob, Counselor

## 2023-11-21 NOTE — ED Notes (Signed)
 Pt moved to BHU, instructed on rules to BHU and to ask if he has questions. Pt tv turned on and provided with blanket.

## 2023-11-21 NOTE — ED Notes (Signed)
 Lunch tray provided to pt.

## 2023-11-21 NOTE — ED Notes (Signed)
Shower supplies provided and shower room unlocked for pt.

## 2023-12-02 ENCOUNTER — Ambulatory Visit: Payer: MEDICAID | Admitting: Internal Medicine
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
## 2023-12-11 ENCOUNTER — Telehealth: Payer: Self-pay

## 2023-12-11 NOTE — Telephone Encounter (Signed)
 Received fax from [facility] reporting positive Hep B test result from 09/23/2023. No previously hep b labs in Epic records or in [facility]. Attempted to contact pt. No answer. LMTRC.

## 2023-12-14 NOTE — Telephone Encounter (Signed)
 Mr. Cutler had returned call this am . Discussed with pt about labs done prior to plasma donation. Per Mr. Gorby he was given a hep b vaccine back in July and since has followed up with [facility] his PCP.

## 2023-12-18 NOTE — Telephone Encounter (Addendum)
 Received visit notes and labs from [facility] . Pt had labs drawn in May 28,2025 Had Hep B antibody drawn and was suggested he get vaccine. Pt was given Twinrix on 09/21/23. Discussed with pt to recheck bloodwork because this could of been a false positive. Pt is in agreement to come in 12/30/23 for labs to recheck for Hep B.

## 2023-12-23 ENCOUNTER — Ambulatory Visit: Payer: MEDICAID | Attending: Internal Medicine | Admitting: Internal Medicine
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
## 2023-12-28 ENCOUNTER — Encounter: Payer: Self-pay | Admitting: Internal Medicine

## 2023-12-30 ENCOUNTER — Ambulatory Visit: Payer: Self-pay

## 2024-01-06 ENCOUNTER — Ambulatory Visit (LOCAL_COMMUNITY_HEALTH_CENTER): Payer: MEDICAID

## 2024-01-06 ENCOUNTER — Ambulatory Visit: Payer: MEDICAID

## 2024-01-06 DIAGNOSIS — Z1159 Encounter for screening for other viral diseases: Secondary | ICD-10-CM

## 2024-01-06 NOTE — Progress Notes (Signed)
 Walked pt to lab. ROI signed.

## 2024-01-14 NOTE — Progress Notes (Signed)
 Called pt to advise Hep B tests results from 01/06/24 were negative and no further follow up. Pt verbalized understanding.

## 2024-01-14 NOTE — Addendum Note (Signed)
 Addended byBETHA ANDRIA GIVENS on: 01/14/2024 04:54 PM   Modules accepted: Orders

## 2024-01-18 LAB — HBV ANTIGEN/ANTIBODY STATE LAB
Hep B Core Total Ab: NONREACTIVE
Hepatitis B Surface Ag: NONREACTIVE
Hepatitis B Surface Ag: NONREACTIVE

## 2024-01-18 NOTE — Addendum Note (Signed)
 Addended byBETHA ANDRIA GIVENS on: 01/18/2024 02:41 PM   Modules accepted: Orders

## 2024-02-03 ENCOUNTER — Ambulatory Visit: Payer: MEDICAID | Admitting: Internal Medicine

## 2024-02-05 NOTE — Progress Notes (Addendum)
 Cardiology Office Note  Date:  02/08/2024   ID:  John [name], John [name] 1980-03-01, MRN [id number]  PCP:  [facility]   Chief Complaint  Patient presents with   New Patient (Initial Visit)    Ref by Dr. Rolland [name] for chest pain. Patient c/o shortness of breath with exertion. Patient c/o a red rash under his right arm pit.      HPI:  John [name] is a 43 y.o. male with past medical history of: Past Medical History:  Diagnosis Date   ASD (atrial septal defect)    Bacterial infection due to H. pylori    Hypertension    Kidney stones   Bipolar type I, alcohol/substance abuse, falls, suicidal ideation, homelessness Who presents by referral from Dr. Kreg [name] for chest pain  Numerous emergency room visits for alcohol intoxication Psychiatric issues, dating back to 2021  Seen in the ER November 12, 2023 reported having chest pain That morning had been drinking copious amounts of alcohol Looks 2 packs of cigarettes per day Medication noncompliant Cannabis positive, alcohol level 140 Cardiac workup negative  Still smoking 2 ppd, Quit drinking September Smells like alcohol today on exam Brought a bag full of his medications from home, reports he is not taking any of them  Boils in right armpit, significant pain, wants antibiotic  EKG personally reviewed by myself on todays visit EKG Interpretation Date/Time:  Monday February 08 2024 14:20:24 EST Ventricular Rate:  97 PR Interval:  154 QRS Duration:  118 QT Interval:  376 QTC Calculation: 477 R Axis:   102  Text Interpretation: Normal sinus rhythm Non-specific intra-ventricular conduction delay When compared with ECG of 12-Nov-2023 01:30, No significant change was found Confirmed by John [name] [phone]) on 02/08/2024 2:32:15 PM    PMH:   has a past medical history of ASD (atrial septal defect), Bacterial infection due to H. pylori, Hypertension, and Kidney stones.   PSH:    Past  Surgical History:  Procedure Laterality Date   ASD REPAIR     FOOT SURGERY Right    KNEE SURGERY Right     Current Outpatient Medications  Medication Sig Dispense Refill   acetaminophen  (TYLENOL ) 325 MG tablet Take 325 mg by mouth every 6 (six) hours as needed.     albuterol  (VENTOLIN  HFA) 108 (90 Base) MCG/ACT inhaler Inhale 2 puffs into the lungs every 6 (six) hours as needed for wheezing or shortness of breath. 8 g 2   cephALEXin  (KEFLEX ) 500 MG capsule Take 1 capsule (500 mg total) by mouth 4 (four) times daily. 28 capsule 0   gabapentin  (NEURONTIN ) 300 MG capsule Take 1 capsule (300 mg total) by mouth every 8 (eight) hours. 90 capsule 0   ibuprofen  (ADVIL ) 400 MG tablet Take 400 mg by mouth every 6 (six) hours as needed.     naltrexone  (DEPADE) 50 MG tablet Take 1 tablet (50 mg total) by mouth daily. 30 tablet 0   nicotine  (NICODERM CQ  - DOSED IN MG/24 HOURS) 14 mg/24hr patch Place 1 patch (14 mg total) onto the skin daily.     thiamine  (VITAMIN B-1) 100 MG tablet Take 1 tablet (100 mg total) by mouth daily. 14 tablet 0   traZODone  (DESYREL ) 100 MG tablet Take 1 tablet (100 mg total) by mouth at bedtime as needed for sleep. 14 tablet 0   No current facility-administered medications for this visit.    Allergies:   Penicillins   Social History:  The  patient  reports that he has been smoking cigarettes. He has been exposed to tobacco smoke. He has never used smokeless tobacco. He reports current alcohol use. He reports current drug use. Drugs: Methamphetamines and Marijuana.   Family History:   family history is not on file.    Review of Systems: Review of Systems  Constitutional: Negative.   HENT: Negative.    Respiratory: Negative.    Cardiovascular: Negative.   Gastrointestinal: Negative.   Musculoskeletal: Negative.   Neurological: Negative.   Psychiatric/Behavioral: Negative.    All other systems reviewed and are negative.    PHYSICAL EXAM: VS:  BP (!) 160/80 (BP  Location: Left Arm, Patient Position: Sitting, Cuff Size: Normal)   Pulse 72   Temp 98 F (36.7 C)   Ht 5' 9 (1.753 m)   Wt 172 lb 9.6 oz (78.3 kg)   BMI 25.49 kg/m  , BMI Body mass index is 25.49 kg/m. GEN: Well nourished, well developed, in no acute distress HEENT: normal Neck: no JVD, carotid bruits, or masses Cardiac: RRR; no murmurs, rubs, or gallops,no edema  Respiratory:  clear to auscultation bilaterally, normal work of breathing GI: soft, nontender, nondistended, + BS MS: no deformity or atrophy Skin: warm and dry, no rash Neuro:  Strength and sensation are intact Psych: euthymic mood, full affect    Recent Labs: 08/07/2023: TSH 3.271 08/24/2023: Magnesium  2.0 11/12/2023: B Natriuretic Peptide 6.2 11/20/2023: ALT 20; BUN 6; Creatinine, Ser 0.65; Hemoglobin 13.9; Platelets 284; Potassium 3.4; Sodium 139    Lipid Panel Lab Results  Component Value Date   CHOL 278 (H) 08/07/2023   HDL 56 08/07/2023   LDLCALC 197 (H) 08/07/2023   TRIG 125 08/07/2023      Wt Readings from Last 3 Encounters:  02/08/24 172 lb 9.6 oz (78.3 kg)  11/20/23 155 lb (70.3 kg)  11/12/23 155 lb (70.3 kg)       ASSESSMENT AND PLAN:  Problem List Items Addressed This Visit     Chest pain of uncertain etiology - Primary   Relevant Orders   EKG 12-Lead (Completed)   Alcohol abuse   Other Visit Diagnoses       SOB (shortness of breath)       Relevant Orders   EKG 12-Lead (Completed)     Smoker         Cellulitis, unspecified cellulitis site          Atypical chest pain Notes from the ER September 2025 with chest pain  Notes detail he had been drinking that morning He is a poor historian Reports he stopped drinking but smells like alcohol today Not taking any of his medications Denies active chest pain Cardiac risk factors include smoking - Numerous visits to the emergency room for alcohol, suicidal ideation - Difficulty laying down for EKG and following directions, benign  EKG once obtained - Will hold off on further testing at this time given poor candidate for stress testing, cardiac catheterization if it were positive, medication noncompliance  Smoker We have encouraged him to continue to work on weaning his cigarettes and smoking cessation. He will continue to work on this and does not want any assistance with chantix.    Alcohol abuse Cessation recommended  Behavioral health Numerous visits to the emergency room for suicidal ideation  Armpit boils Numerous erythematous raised what appear to be boils in his right armpit Difficulty putting his arm down secondary to discomfort Recommend he take Keflex  500 mg 4 times daily  7 days If no improvement suggested follow-up in urgent care  Signed, Velinda Lunger, M.D., Ph.D. [facility] [place], [place] [phone]

## 2024-02-08 ENCOUNTER — Ambulatory Visit: Payer: MEDICAID | Attending: Cardiovascular Disease | Admitting: Cardiovascular Disease

## 2024-02-08 VITALS — BP 160/80 | HR 72 | Temp 98.0°F | Ht 69.0 in | Wt 172.6 lb

## 2024-02-08 DIAGNOSIS — R0602 Shortness of breath: Secondary | ICD-10-CM | POA: Diagnosis present

## 2024-02-08 DIAGNOSIS — F1010 Alcohol abuse, uncomplicated: Secondary | ICD-10-CM | POA: Diagnosis present

## 2024-02-08 DIAGNOSIS — R079 Chest pain, unspecified: Secondary | ICD-10-CM | POA: Insufficient documentation

## 2024-02-08 DIAGNOSIS — F17200 Nicotine dependence, unspecified, uncomplicated: Secondary | ICD-10-CM | POA: Diagnosis present

## 2024-02-08 DIAGNOSIS — L0390 Cellulitis, unspecified: Secondary | ICD-10-CM | POA: Diagnosis present

## 2024-02-08 MED ORDER — CEPHALEXIN 500 MG PO CAPS: 500.0000 mg | ORAL_CAPSULE | Freq: Four times a day (QID) | ORAL | 0 refills | Status: DC

## 2024-02-08 NOTE — Patient Instructions (Addendum)
 Medication Instructions:   Keflex  500 mg four times a day for 7 days (breakfast/lunch/dinner and before bed)  If you need a refill on your cardiac medications before your next appointment, please call your pharmacy.   Lab work: No new labs needed  Testing/Procedures: No new testing needed  Follow-Up: At [facility], you and your health needs are our priority.  As part of our continuing mission to provide you with exceptional heart care, we have created designated Provider Care Teams.  These Care Teams include your primary Cardiologist (physician) and Advanced Practice Providers (APPs -  Physician Assistants and Nurse Practitioners) who all work together to provide you with the care you need, when you need it.  You will need a follow up appointment in 12 months  Providers on your designated Care Team:   Lonni Meager, NP Bernardino Bring, PA-C Cadence [place], [place]  COVID-19 Vaccine Information can be found at: [email or web address] For questions related to vaccine distribution or appointments, please email [email or web address] or call 7053471877.

## 2024-02-09 ENCOUNTER — Other Ambulatory Visit (HOSPITAL_COMMUNITY)
Admission: EM | Admit: 2024-02-09 | Discharge: 2024-02-12 | Disposition: A | Discharge status: Home / Self Care | Payer: MEDICAID | Source: Intra-hospital | Attending: Psychiatry | Admitting: Psychiatry

## 2024-02-09 ENCOUNTER — Other Ambulatory Visit: Payer: Self-pay

## 2024-02-09 ENCOUNTER — Emergency Department (HOSPITAL_COMMUNITY)
Admission: EM | Admit: 2024-02-09 | Discharge: 2024-02-09 | Disposition: A | Discharge status: Other Acute Inpatient Hospital | Payer: MEDICAID | Source: Home / Self Care | Attending: Emergency Medicine | Admitting: Emergency Medicine

## 2024-02-09 DIAGNOSIS — L02411 Cutaneous abscess of right axilla: Secondary | ICD-10-CM

## 2024-02-09 DIAGNOSIS — F329 Major depressive disorder, single episode, unspecified: Secondary | ICD-10-CM

## 2024-02-09 DIAGNOSIS — F845 Asperger's syndrome: Secondary | ICD-10-CM | POA: Diagnosis not present

## 2024-02-09 DIAGNOSIS — F1010 Alcohol abuse, uncomplicated: Secondary | ICD-10-CM | POA: Diagnosis present

## 2024-02-09 DIAGNOSIS — Z5901 Sheltered homelessness: Secondary | ICD-10-CM | POA: Insufficient documentation

## 2024-02-09 DIAGNOSIS — F1020 Alcohol dependence, uncomplicated: Secondary | ICD-10-CM | POA: Diagnosis present

## 2024-02-09 DIAGNOSIS — Z79899 Other long term (current) drug therapy: Secondary | ICD-10-CM | POA: Insufficient documentation

## 2024-02-09 DIAGNOSIS — F1014 Alcohol abuse with alcohol-induced mood disorder: Secondary | ICD-10-CM | POA: Insufficient documentation

## 2024-02-09 DIAGNOSIS — F32A Depression, unspecified: Secondary | ICD-10-CM | POA: Insufficient documentation

## 2024-02-09 DIAGNOSIS — Z560 Unemployment, unspecified: Secondary | ICD-10-CM | POA: Insufficient documentation

## 2024-02-09 DIAGNOSIS — R45851 Suicidal ideations: Secondary | ICD-10-CM | POA: Diagnosis not present

## 2024-02-09 DIAGNOSIS — Y904 Blood alcohol level of 80-99 mg/100 ml: Secondary | ICD-10-CM | POA: Diagnosis not present

## 2024-02-09 DIAGNOSIS — I10 Essential (primary) hypertension: Secondary | ICD-10-CM | POA: Insufficient documentation

## 2024-02-09 DIAGNOSIS — F411 Generalized anxiety disorder: Secondary | ICD-10-CM | POA: Diagnosis not present

## 2024-02-09 DIAGNOSIS — R000 Tachycardia, unspecified: Secondary | ICD-10-CM | POA: Diagnosis not present

## 2024-02-09 LAB — COMPREHENSIVE METABOLIC PANEL WITH GFR
ALT: 9 U/L (ref 0–44)
AST: 25 U/L (ref 15–41)
Albumin: 4.8 g/dL (ref 3.5–5.0)
Alkaline Phosphatase: 85 U/L (ref 38–126)
Anion gap: 16 — ABNORMAL HIGH (ref 5–15)
BUN: 5 mg/dL — ABNORMAL LOW (ref 6–20)
CO2: 24 mmol/L (ref 22–32)
Calcium: 9.9 mg/dL (ref 8.9–10.3)
Chloride: 99 mmol/L (ref 98–111)
Creatinine, Ser: 0.72 mg/dL (ref 0.61–1.24)
GFR, Estimated: >60 mL/min (ref >60)
Glucose, Bld: 90 mg/dL (ref 70–99)
Potassium: 3.8 mmol/L (ref 3.5–5.1)
Sodium: 140 mmol/L (ref 135–145)
Total Bilirubin: 0.3 mg/dL (ref 0.0–1.2)
Total Protein: 8.7 g/dL — ABNORMAL HIGH (ref 6.5–8.1)

## 2024-02-09 LAB — URINE DRUG SCREEN
Amphetamines: NEGATIVE
Barbiturates: NEGATIVE
Benzodiazepines: NEGATIVE
Cocaine: NEGATIVE
Fentanyl: NEGATIVE
Methadone Scn, Ur: NEGATIVE
Opiates: NEGATIVE
Tetrahydrocannabinol: POSITIVE — AB

## 2024-02-09 LAB — CBC
HCT: 48.6 % (ref 39.0–52.0)
Hemoglobin: 16.1 g/dL (ref 13.0–17.0)
MCH: 30.1 pg (ref 26.0–34.0)
MCHC: 33.1 g/dL (ref 30.0–36.0)
MCV: 90.8 fL (ref 80.0–100.0)
Platelets: 321 K/uL (ref 150–400)
RBC: 5.35 MIL/uL (ref 4.22–5.81)
RDW: 12.9 % (ref 11.5–15.5)
WBC: 13.1 K/uL — ABNORMAL HIGH (ref 4.0–10.5)
nRBC: 0 % (ref 0.0–0.2)

## 2024-02-09 LAB — ETHANOL: Alcohol, Ethyl (B): 87 mg/dL — ABNORMAL HIGH (ref <15)

## 2024-02-09 MED ORDER — TRAZODONE HCL 100 MG PO TABS
100.0000 mg | ORAL_TABLET | Freq: Every evening | ORAL | Status: DC | PRN
  Administered 2024-02-09 – 2024-02-11 (×3): 100 mg via ORAL
  Filled 2024-02-09 (×3): qty 1

## 2024-02-09 MED ORDER — MAGNESIUM HYDROXIDE 400 MG/5ML PO SUSP: 30.0000 mL | Freq: Every day | ORAL | Status: DC | PRN

## 2024-02-09 MED ORDER — THIAMINE HCL 100 MG PO TABS
100.0000 mg | ORAL_TABLET | Freq: Once | ORAL | Status: AC
  Administered 2024-02-09: 11:00:00 100 mg via ORAL
  Filled 2024-02-09: qty 1

## 2024-02-09 MED ORDER — CHLORDIAZEPOXIDE HCL 25 MG PO CAPS: 25.0000 mg | ORAL_CAPSULE | Freq: Four times a day (QID) | ORAL | Status: DC | PRN

## 2024-02-09 MED ORDER — ADULT MULTIVITAMIN W/MINERALS CH
1.0000 | ORAL_TABLET | Freq: Every day | ORAL | Status: DC
  Administered 2024-02-09: 11:00:00 1 via ORAL
  Filled 2024-02-09: qty 1

## 2024-02-09 MED ORDER — DIPHENHYDRAMINE HCL 50 MG PO CAPS: 50.0000 mg | ORAL_CAPSULE | Freq: Three times a day (TID) | ORAL | Status: DC | PRN

## 2024-02-09 MED ORDER — DIPHENHYDRAMINE HCL 50 MG/ML IJ SOLN: 50.0000 mg | Freq: Three times a day (TID) | INTRAMUSCULAR | Status: DC | PRN

## 2024-02-09 MED ORDER — ONDANSETRON 4 MG PO TBDP: 4.0000 mg | ORAL_TABLET | Freq: Four times a day (QID) | ORAL | Status: DC | PRN

## 2024-02-09 MED ORDER — ALUM & MAG HYDROXIDE-SIMETH 200-200-20 MG/5ML PO SUSP: 30.0000 mL | ORAL | Status: DC | PRN

## 2024-02-09 MED ORDER — LORAZEPAM 2 MG/ML IJ SOLN: 2.0000 mg | Freq: Three times a day (TID) | INTRAMUSCULAR | Status: DC | PRN

## 2024-02-09 MED ORDER — HYDROXYZINE HCL 25 MG PO TABS: 25.0000 mg | ORAL_TABLET | Freq: Four times a day (QID) | ORAL | Status: DC | PRN

## 2024-02-09 MED ORDER — NICOTINE 21 MG/24HR TD PT24
21.0000 mg | MEDICATED_PATCH | Freq: Every day | TRANSDERMAL | Status: DC
  Administered 2024-02-10 – 2024-02-12 (×3): 21 mg via TRANSDERMAL
  Filled 2024-02-09 (×3): qty 1

## 2024-02-09 MED ORDER — ACETAMINOPHEN 325 MG PO TABS
650.0000 mg | ORAL_TABLET | Freq: Four times a day (QID) | ORAL | Status: DC | PRN
  Administered 2024-02-10: 16:00:00 650 mg via ORAL
  Filled 2024-02-09: qty 2

## 2024-02-09 MED ORDER — DOXYCYCLINE HYCLATE 100 MG PO TABS
100.0000 mg | ORAL_TABLET | Freq: Two times a day (BID) | ORAL | Status: DC
  Administered 2024-02-09 – 2024-02-12 (×6): 100 mg via ORAL
  Filled 2024-02-09 (×4): qty 1
  Filled 2024-02-09: qty 9
  Filled 2024-02-09 (×2): qty 1

## 2024-02-09 MED ORDER — CHLORDIAZEPOXIDE HCL 25 MG PO CAPS: 25.0000 mg | ORAL_CAPSULE | Freq: Four times a day (QID) | ORAL | Status: AC | PRN

## 2024-02-09 MED ORDER — HALOPERIDOL 5 MG PO TABS: 5.0000 mg | ORAL_TABLET | Freq: Three times a day (TID) | ORAL | Status: DC | PRN

## 2024-02-09 MED ORDER — ESCITALOPRAM OXALATE 5 MG PO TABS
5.0000 mg | ORAL_TABLET | Freq: Every day | ORAL | Status: DC
  Administered 2024-02-09 – 2024-02-12 (×4): 5 mg via ORAL
  Filled 2024-02-09 (×4): qty 1

## 2024-02-09 MED ORDER — HYDROXYZINE HCL 25 MG PO TABS
50.0000 mg | ORAL_TABLET | Freq: Three times a day (TID) | ORAL | Status: DC | PRN
  Administered 2024-02-10: 16:00:00 25 mg via ORAL
  Administered 2024-02-11: 14:00:00 50 mg via ORAL
  Filled 2024-02-09 (×3): qty 2

## 2024-02-09 MED ORDER — HYDROXYZINE HCL 25 MG PO TABS
25.0000 mg | ORAL_TABLET | Freq: Four times a day (QID) | ORAL | Status: AC | PRN
  Administered 2024-02-09 – 2024-02-11 (×2): 25 mg via ORAL
  Filled 2024-02-09: qty 1

## 2024-02-09 MED ORDER — NICOTINE 21 MG/24HR TD PT24
21.0000 mg | MEDICATED_PATCH | Freq: Every day | TRANSDERMAL | Status: DC
  Administered 2024-02-09: 11:00:00 21 mg via TRANSDERMAL
  Filled 2024-02-09: qty 1

## 2024-02-09 MED ORDER — THIAMINE HCL 100 MG/ML IJ SOLN: 100.0000 mg | Freq: Once | INTRAMUSCULAR | Status: DC

## 2024-02-09 MED ORDER — LOPERAMIDE HCL 2 MG PO CAPS: 2.0000 mg | ORAL_CAPSULE | ORAL | Status: DC | PRN

## 2024-02-09 MED ORDER — HALOPERIDOL LACTATE 5 MG/ML IJ SOLN: 5.0000 mg | Freq: Three times a day (TID) | INTRAMUSCULAR | Status: DC | PRN

## 2024-02-09 MED ORDER — DOXYCYCLINE HYCLATE 100 MG PO TABS
100.0000 mg | ORAL_TABLET | Freq: Two times a day (BID) | ORAL | Status: DC
  Administered 2024-02-09: 11:00:00 100 mg via ORAL
  Filled 2024-02-09: qty 1

## 2024-02-09 MED ORDER — HALOPERIDOL LACTATE 5 MG/ML IJ SOLN: 10.0000 mg | Freq: Three times a day (TID) | INTRAMUSCULAR | Status: DC | PRN

## 2024-02-09 MED ORDER — GABAPENTIN 300 MG PO CAPS
300.0000 mg | ORAL_CAPSULE | Freq: Three times a day (TID) | ORAL | Status: DC
  Administered 2024-02-09 – 2024-02-12 (×8): 300 mg via ORAL
  Filled 2024-02-09 (×6): qty 1
  Filled 2024-02-09: qty 21
  Filled 2024-02-09 (×2): qty 1

## 2024-02-09 NOTE — Progress Notes (Signed)
 Pt has been accepted to [facility] on 02/09/2024 . Bed assignment:158   Pt meets inpatient criteria per Cathaleen Adam, NP   Attending Physician will be Dr. Kandi Hahn   Report can be called to: [phone]  Care Team Notified: [facility] Cherylynn Ernst, RN, Chesley Holt, Cathaleen Adam, NP

## 2024-02-09 NOTE — ED Provider Notes (Signed)
 [place] EMERGENCY DEPARTMENT AT [facility] Provider Note   CSN: [id number] Arrival date & time: 02/09/24  0800     Patient presents with: Alcohol Problem   John [name] is a 43 y.o. male.   Patient is a 43 year old male with a history of hypertension, ASD, prior H. pylori, chronic alcohol use, depression and bipolar disease who is presenting today reporting that he is suicidal.  Patient does admit to drinking significant amounts of alcohol usually on a daily basis anywhere between 3 or 4 40 ounce cans of beer daily.  He last drank around 1 AM this morning.  He also uses marijuana products.  He reports that he became suicidal yesterday after he and his dad got in an argument and his dad kicked him out of the house.  He now has nowhere to go and thought he would just jump off a bridge or run in front of a car.  He has tried to kill himself in the past by cutting his wrists.  He has psychiatric medications but has not been taking any of them for unknown reasons.  He is chronically noncompliant with medication.  He does report getting tremors with alcohol withdrawal but has never had seizures.  The history is provided by the patient.  Alcohol Problem       Prior to Admission medications  Medication Sig Start Date End Date Taking? Authorizing Provider  escitalopram  (LEXAPRO ) 5 MG tablet ESCITALOPRAM  OXALATE 5 MG TABS 09/21/23  Yes [provider]  hydrOXYzine  (ATARAX ) 50 MG tablet HYDROXYZINE  HCL 50 MG TABS 01/06/24  Yes [provider]  acetaminophen  (TYLENOL ) 325 MG tablet Take 325 mg by mouth every 6 (six) hours as needed. 11/19/23   [provider]  albuterol  (VENTOLIN  HFA) 108 (90 Base) MCG/ACT inhaler Inhale 2 puffs into the lungs every 6 (six) hours as needed for wheezing or shortness of breath. 11/12/23   Nicholaus Rolland BRAVO, MD  cephALEXin  (KEFLEX ) 500 MG capsule Take 1 capsule (500 mg total) by mouth 4 (four) times daily. 02/08/24   Gollan,  Timothy J, MD  gabapentin  (NEURONTIN ) 300 MG capsule Take 1 capsule (300 mg total) by mouth every 8 (eight) hours. 11/10/23   Bennett, Christal H, NP  ibuprofen  (ADVIL ) 400 MG tablet Take 400 mg by mouth every 6 (six) hours as needed. 11/19/23   [provider]  naltrexone  (DEPADE) 50 MG tablet Take 1 tablet (50 mg total) by mouth daily. 11/10/23   Bennett, Christal H, NP  nicotine  (NICODERM CQ  - DOSED IN MG/24 HOURS) 14 mg/24hr patch Place 1 patch (14 mg total) onto the skin daily. 11/11/23   Bennett, Christal H, NP  thiamine  (VITAMIN B-1) 100 MG tablet Take 1 tablet (100 mg total) by mouth daily. 11/11/23   Bennett, Christal H, NP  traZODone  (DESYREL ) 100 MG tablet Take 1 tablet (100 mg total) by mouth at bedtime as needed for sleep. 11/10/23   Blair Chiquita DEL, NP    Allergies: Penicillins    Review of Systems  Updated Vital Signs BP (!) 138/98 (BP Location: Left Arm)   Pulse (!) 105   Temp 98.1 F (36.7 C)   Resp 18   Ht 5' 9 (1.753 m)   Wt 77.1 kg   SpO2 97%   BMI 25.10 kg/m   Physical Exam Vitals and nursing note reviewed.  Constitutional:      General: He is not in acute distress.    Appearance: He is well-developed.  HENT:     Head: Normocephalic and atraumatic.     Mouth/Throat:     Mouth: Mucous membranes are dry.  Eyes:     Conjunctiva/sclera: Conjunctivae normal.     Pupils: Pupils are equal, round, and reactive to light.  Cardiovascular:     Rate and Rhythm: Regular rhythm. Tachycardia present.     Heart sounds: No murmur heard. Pulmonary:     Effort: Pulmonary effort is normal. No respiratory distress.     Breath sounds: Normal breath sounds. No wheezing or rales.  Abdominal:     General: There is no distension.     Palpations: Abdomen is soft.     Tenderness: There is no abdominal tenderness. There is no guarding or rebound.  Musculoskeletal:        General: No tenderness. Normal range of motion.     Cervical back: Normal range of motion and neck  supple.  Skin:    General: Skin is warm and dry.     Findings: No erythema or rash.  Neurological:     Mental Status: He is alert and oriented to person, place, and time.     Comments: Slightly tremulous with outstretched arms.  No tongue fasciculations  Psychiatric:        Mood and Affect: Affect is flat.        Behavior: Behavior normal.        Thought Content: Thought content includes suicidal ideation. Thought content includes suicidal plan.     (all labs ordered are listed, but only abnormal results are displayed) Labs Reviewed  COMPREHENSIVE METABOLIC PANEL WITH GFR - Abnormal; Notable for the following components:      Result Value   BUN 5 (*)    Total Protein 8.7 (*)    Anion gap 16 (*)    All other components within normal limits  ETHANOL - Abnormal; Notable for the following components:   Alcohol, Ethyl (B) 87 (*)    All other components within normal limits  CBC - Abnormal; Notable for the following components:   WBC 13.1 (*)    All other components within normal limits  URINE DRUG SCREEN - Abnormal; Notable for the following components:   Tetrahydrocannabinol POSITIVE (*)    All other components within normal limits    EKG: None  Radiology: No results found.   Procedures  INCISION AND DRAINAGE Performed by: Benton Shone Consent: Verbal consent obtained. Risks and benefits: risks, benefits and alternatives were discussed Type: abscess  Body area: right axilla  Anesthesia: local infiltration  Incision was made with a scalpel.  Local anesthetic: lidocaine  2% with epinephrine  Anesthetic total: 3 ml  Complexity: complex Blunt dissection to break up loculations  Drainage: purulent  Drainage amount: 5mL  Packing material: none  Patient tolerance: Patient tolerated the procedure well with no immediate complications.  Medications Ordered in the ED  thiamine  (VITAMIN B1) injection 100 mg (has no administration in time range)  multivitamin  with minerals tablet 1 tablet (has no administration in time range)  chlordiazePOXIDE  (LIBRIUM ) capsule 25 mg (has no administration in time range)  hydrOXYzine  (ATARAX ) tablet 25 mg (has no administration in time range)  loperamide  (IMODIUM ) capsule 2-4 mg (has no administration in time range)  ondansetron  (ZOFRAN -ODT) disintegrating tablet 4 mg (has no administration in time range)  nicotine  (NICODERM CQ  - dosed in mg/24 hours) patch 21 mg (has no administration in time range)  doxycycline  (VIBRA -TABS) tablet 100 mg (has no administration in time range)  Medical Decision Making Amount and/or Complexity of Data Reviewed Labs: ordered. Decision-making details documented in ED Course.  Risk OTC drugs. Prescription drug management.   Pt with multiple medical problems and comorbidities and presenting today with a complaint that caries a high risk for morbidity and mortality.  Here today with complaint of suicidal ideation.  Patient also is a fairly heavy alcohol abuser.  Patient is awake and alert at this time.  Minimal signs of withdrawal.  I independently interpreted patient's labs and CMP is within normal limits other than mildly elevated anion gap of 16, alcohol level is 87 and leukocytosis of 13.  Patient reports he is going to kill himself by either walking in front of a car or jumping off a bridge.  At this time he appears medically clear will start on Librium  CIWA precautions and have TTS evaluate. Abscess of eczema were drained and patient was started on doxycycline 's for some mild localized cellulitis.      Final diagnoses:  Alcohol abuse  Suicidal ideation  Abscess of axilla, right    ED Discharge Orders     None          Doretha Folks, MD 02/09/24 [phone]

## 2024-02-09 NOTE — ED Notes (Signed)
 [facility] spoke with pt about working with a Secondary School Teacher from the county to assist him with resources and housing. Pt agreed so [facility] brought Harlene Garbe and Brie her colleague in to speak with pt before his transfer to [facility].   Chesley Holt, [facility]  02/09/24

## 2024-02-09 NOTE — ED Triage Notes (Addendum)
 Pt ambulatory to triage with rolling duffle bag. Pt is requesting detox from alcohol and marijuana. Pt reports that he drinks 3-4 40oz beers daily, last drink at approx 01:00. Pt states that he smokes 1 joint per day.   Pt denies a hx of seizures.

## 2024-02-09 NOTE — ED Notes (Signed)
 Patient wanded by security. Has 2 black bags and 1 patient belonging bag located in locker 35.

## 2024-02-09 NOTE — Group Note (Signed)
 Group Topic: Healthy Self Image and Positive Change  Group Date: 02/09/2024 Start Time: 2030 End Time: 2100 Facilitators: Anice Benton LABOR, NT  Department: [facility]  Number of Participants: 4  Group Focus: clarity of thought, discharge education, and feeling awareness/expression Treatment Modality:  Patient-Centered Therapy Interventions utilized were group exercise and support Purpose: explore maladaptive thinking and express feelings  Name: John [name] Date of Birth: 1980-12-16  MR: [id number]    Level of Participation: active Quality of Participation: cooperative Interactions with others: gave feedback Mood/Affect: appropriate Triggers (if applicable): N/A Cognition: goal directed Progress: Moderate Response: good  Plan: follow-up needed  Patients Problems:  Patient Active Problem List   Diagnosis Date Noted   Chest pain of uncertain etiology 02/05/2024   Alcohol use disorder, severe, dependence (HCC) 11/06/2023   Alcohol withdrawal (HCC) 08/23/2023   Tachycardia 08/23/2023   Alcohol dependence with uncomplicated withdrawal (HCC) 08/23/2023   MDD (major depressive disorder) 08/05/2023   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

## 2024-02-09 NOTE — Consult Note (Signed)
 [facility] Psychiatric Consult Initial  Patient Name: .John [name]  MRN: [id number]  DOB: 11-15-1980  Consult Order details:  Orders (From admission, onward)     Start     Ordered   02/09/24 0936  CONSULT TO CALL ACT TEAM       Ordering Provider: Doretha Folks, MD  Provider:  (Not yet assigned)  Question:  Reason for Consult?  Answer:  Psych consult   02/09/24 0935             Mode of Visit: In person    Psychiatry Consult Evaluation  Service Date: February 09, 2024 LOS:  LOS: 0 days  Chief Complaint Suicidal ideation in the context of chronic alcohol use, psychosocial stressors, and medication nonadherence.  Primary Psychiatric Diagnoses  MDD (major depressive disorder) Alcohol use disorder, severe, dependence (HCC)  Assessment  John [name] is a 43 y.o. male admitted: Presented to the EDfor 02/09/2024  8:05 AM for Suicidal ideation in the context of chronic alcohol use, psychosocial stressors, and medication nonadherence. He carries the psychiatric diagnoses of depression and anxiety and has a past medical history of chronic back pain.   The patient presents with active suicidal ideation in the context of severe alcohol use disorder, psychosocial stressors (housing instability, family conflict, unemployment), medication nonadherence, and underlying mood disorders. He is at elevated risk for self-harm given ongoing substance use, lack of stable housing, limited support, and poor coping skills. He is also at risk for alcohol withdrawal, given daily heavy use and reported withdrawal symptoms. Please see plan below for detailed recommendations.   Diagnoses:  Active Hospital problems: Principal Problem:   MDD (major depressive disorder) Active Problems:   Alcohol use disorder, severe, dependence (HCC)    Plan   ## Psychiatric Medication Recommendations:  Continue home medications  ## Medical Decision Making Capacity: Not specifically addressed in this  encounter  ## Further Work-up:  -- No further work up needed at this time  EKG or UDS -- most recent EKG on 02/09/2024 had QtC of 477 -- Pertinent labwork reviewed earlier this admission includes: CBC, CMP, EKG, UDS   ## Disposition:-- Patient meets criteria for facility-based crisis treatment due to suicidal ideation, active substance use, withdrawal risk, and inability to ensure safety in the community.  ## Behavioral / Environmental: -Difficult Patient (SELECT OPTIONS FROM BELOW), To minimize splitting of staff, assign one staff person to communicate all information from the team when feasible., or Utilize compassion and acknowledge the patient's experiences while setting clear and realistic expectations for care.    ## Safety and Observation Level:  - Based on my clinical evaluation, I estimate the patient to be at low risk of self harm in the current setting. - At this time, we recommend  routine. This decision is based on my review of the chart including patient's history and current presentation, interview of the patient, mental status examination, and consideration of suicide risk including evaluating suicidal ideation, plan, intent, suicidal or self-harm behaviors, risk factors, and protective factors. This judgment is based on our ability to directly address suicide risk, implement suicide prevention strategies, and develop a safety plan while the patient is in the clinical setting. Please contact our team if there is a concern that risk level has changed.  CSSR Risk Category:C-SSRS RISK CATEGORY: No Risk  Suicide Risk Assessment: Patient has following modifiable risk factors for suicide: medication noncompliance and triggering events, which we are addressing by recommending facility-based crisis treatment due to suicidal ideation,  active substance use, withdrawal risk, and inability to ensure safety in the community. Patient has following non-modifiable or demographic risk factors  for suicide: male, past psychiatric hospitalization Patient has the following protective factors against suicide: Supportive friends  Thank you for this consult request. Recommendations have been communicated to the primary team.  We will continue to follow patient at this time.   Cristan Hout MOTLEY-MANGRUM, PMHNP       History of Present Illness  Relevant Aspects of Hospital ED Course:  Admitted on 02/09/2024 for Suicidal ideation in the context of chronic alcohol use, psychosocial stressors, and medication nonadherence.  Patient Report:  The patient is a 43 year old male with a psychiatric history significant for depression, bipolar disorder, autism spectrum disorder, and chronic alcohol use disorder, who presents to the ED endorsing suicidal ideation. He reports that suicidal thoughts began yesterday following an argument with his father, during which the patient was asked to leave the home. The conflict reportedly centered around the patients alcohol use and disagreement regarding psychiatric treatment, as the father believes psychiatric medications are addictive.  The patient reports he has been staying with his father for approximately three months and does not feel comfortable living there but has limited housing options. He currently reports homelessness risk, unemployment, financial stressors, social isolation, and limited support systems.  The patient admits to drinking alcohol daily, typically 3-4 forty-ounce beers per day, with last alcohol use around 1:00 AM today. He reports experiencing tremors during alcohol withdrawal in the past but denies a history of withdrawal seizures or delirium tremens. He also endorses marijuana use and daily cigarette smoking. He denies use of other illicit substances.  The patient reports a history of detox treatment at [facility] in [place], which he found beneficial, and states he previously had an AA sponsor, which he found helpful and  motivating. He admits to medication nonadherence related to concerns about addiction and family influence.  Psych ROS:  Depression: Endorses Anxiety:  Endorses Mania (lifetime and current): Denies  Psychosis: (lifetime and current): Denies   Collateral information:  Contacted none per patient request  Review of Systems  Psychiatric/Behavioral:  Positive for depression and substance abuse.      Psychiatric and Social History  Psychiatric History:  Information collected from patient and chart reviewed  Prev Dx/Sx: Bipolar depression and anxiety Current Psych Provider: Denies  Home Meds (current): Yes not compliant Previous Med Trials: Yes Therapy: Denies   Prior Psych Hospitalization: Yes Prior Self Harm: Denies  Prior Violence: Denies   Family Psych History: Denies  Family Hx suicide: Denies   Social History:  Developmental Hx: Deferred Educational Hx: Graduated high school Occupational Hx: Unemployed Legal Hx: Denies  Living Situation: Lives off-and-on with father Spiritual Hx: Yes Access to weapons/lethal means: Denies    Substance History Alcohol: Yes Type of alcohol varies, mainly beer Last Drink this morning Number of drinks per day 3-4, 40 ounce beers History of alcohol withdrawal seizures denies History of DT's yes Tobacco: Yes Illicit drugs: Yes Prescription drug abuse: Denies  Rehab hx: Denies   Exam Findings  Physical Exam:  Vital Signs:  Temp:  [98 F (36.7 C)-98.1 F (36.7 C)] 98.1 F (36.7 C) (12/16 0805) Pulse Rate:  [72-105] 105 (12/16 0805) Resp:  [18] 18 (12/16 0805) BP: (138-160)/(80-98) 138/98 (12/16 0805) SpO2:  [97 %] 97 % (12/16 0805) Weight:  [77.1 kg-78.3 kg] 77.1 kg (12/16 0805) Blood pressure (!) 138/98, pulse (!) 105, temperature 98.1 F (36.7 C), resp.  rate 18, height 5' 9 (1.753 m), weight 77.1 kg, SpO2 97%. Body mass index is 25.1 kg/m.  Physical Exam Neurological:     Mental Status: He is alert.  Psychiatric:         Attention and Perception: Attention normal.        Mood and Affect: Mood is depressed.        Behavior: Behavior is cooperative.        Thought Content: Thought content includes suicidal ideation.        Cognition and Memory: Cognition is impaired.     Mental Status Exam: General Appearance: Disheveled  Orientation:  Full (Time, Place, and Person)  Memory:  Immediate;   Fair Remote;   Fair  Concentration:  Concentration: Fair and Attention Span: Fair  Recall:  Fair  Attention  Fair  Eye Contact:  Fair  Speech:  Clear and Coherent  Language:  Fair  Volume:  Normal  Mood: Depressed.  Affect:  Restricted, congruent with stated mood.  Thought Process:  Linear, logical, and goal-directed.  Thought Content:  Endorses suicidal ideation without current plan or intent while in the ED. Denies homicidal ideation. No delusions or paranoia elicited.  Suicidal Thoughts:  Yes.  without intent/plan  Homicidal Thoughts:  No  Judgement:  Impaired, as evidenced by ongoing alcohol use, medication nonadherence, and suicidal ideation.  Insight:  Limited regarding severity of substance use and need for ongoing psychiatric treatment.  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  Family History:  The patient's family history is not on file.  Medical History: Past Medical History:  Diagnosis Date   ASD (atrial septal defect)    Bacterial infection due to H. pylori    Hypertension    Kidney stones     Surgical History: Past Surgical History:  Procedure Laterality Date   ASD REPAIR     FOOT SURGERY Right    KNEE SURGERY Right      Medications:  Current Medications[1]  Allergies: Allergies[2]  Omaree Fuqua  MOTLEY-MANGRUM, PMHNP     [1]  Current Facility-Administered Medications:    chlordiazePOXIDE  (LIBRIUM ) capsule 25 mg, 25 mg, Oral, Q6H PRN, Doretha, Whitney, MD   doxycycline  (VIBRA -TABS) tablet 100 mg, 100 mg, Oral, Q12H, Plunkett, Whitney, MD   hydrOXYzine  (ATARAX ) tablet 25 mg, 25 mg, Oral, Q6H PRN, Doretha, Whitney, MD   loperamide  (IMODIUM ) capsule 2-4 mg, 2-4 mg, Oral, PRN, Plunkett, Whitney, MD   multivitamin with minerals tablet 1 tablet, 1 tablet, Oral, Daily, Plunkett, Whitney, MD   nicotine  (NICODERM CQ  - dosed in mg/24 hours) patch 21 mg, 21 mg, Transdermal, Daily, Plunkett, Whitney, MD   ondansetron  (ZOFRAN -ODT) disintegrating tablet 4 mg, 4 mg, Oral, Q6H PRN, Doretha, Whitney, MD   thiamine  (VITAMIN B1) injection 100 mg, 100 mg, Intramuscular, Once, Doretha Folks, MD  Current Outpatient Medications:    escitalopram  (LEXAPRO ) 5 MG tablet, ESCITALOPRAM  OXALATE 5 MG TABS, Disp: , Rfl:    hydrOXYzine  (ATARAX ) 50 MG tablet, HYDROXYZINE  HCL 50 MG TABS, Disp: , Rfl:    acetaminophen  (TYLENOL ) 325 MG tablet, Take 325 mg by mouth every 6 (six) hours as needed., Disp: , Rfl:  albuterol  (VENTOLIN  HFA) 108 (90 Base) MCG/ACT inhaler, Inhale 2 puffs into the lungs every 6 (six) hours as needed for wheezing or shortness of breath., Disp: 8 g, Rfl: 2   cephALEXin  (KEFLEX ) 500 MG capsule, Take 1 capsule (500 mg total) by mouth 4 (four) times daily., Disp: 28 capsule, Rfl: 0   gabapentin  (NEURONTIN ) 300 MG capsule, Take 1 capsule (300 mg total) by mouth every 8 (eight) hours., Disp: 90 capsule, Rfl: 0   ibuprofen  (ADVIL ) 400 MG tablet, Take 400 mg by mouth every 6 (six) hours as needed., Disp: , Rfl:    naltrexone  (DEPADE) 50 MG tablet, Take 1 tablet (50 mg total) by mouth daily., Disp: 30 tablet, Rfl: 0   nicotine  (NICODERM CQ  - DOSED IN MG/24 HOURS) 14 mg/24hr patch, Place 1 patch (14 mg total) onto the skin daily., Disp: , Rfl:    thiamine  (VITAMIN B-1) 100 MG tablet, Take 1 tablet  (100 mg total) by mouth daily., Disp: 14 tablet, Rfl: 0   traZODone  (DESYREL ) 100 MG tablet, Take 1 tablet (100 mg total) by mouth at bedtime as needed for sleep., Disp: 14 tablet, Rfl: 0 [2]  Allergies Allergen Reactions   Penicillins Other (See Comments)    Unknown

## 2024-02-09 NOTE — ED Notes (Signed)
 Report called to Lauren RN at [facility]

## 2024-02-09 NOTE — ED Notes (Signed)
 Pt continues to display pleasant , cooperative affect. Pt ate dinner. Pt maintained compliance with increased fluids. No concerns or complaints reported to RN.

## 2024-02-09 NOTE — ED Notes (Addendum)
 Pt received in room at the beginning of this shift with eyes closed resting calmly.  In no apparent distress. Safety maintained.

## 2024-02-09 NOTE — ED Notes (Signed)
 Pt presents as calm and cooperative. Verbal contact for safety provided, pt ensured staff that he would be safe on unit and has no current intent to harm self, however, does have some passive SI. Pt reports having a boil drained last night under his R armpit and was supposed to be started on antibiotics. 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 Pt denies HI and AVH. Pt currently socializing with peers in dayroom, no signs of distress observed.

## 2024-02-10 DIAGNOSIS — F1014 Alcohol abuse with alcohol-induced mood disorder: Secondary | ICD-10-CM | POA: Diagnosis not present

## 2024-02-10 DIAGNOSIS — F845 Asperger's syndrome: Secondary | ICD-10-CM | POA: Diagnosis not present

## 2024-02-10 DIAGNOSIS — F411 Generalized anxiety disorder: Secondary | ICD-10-CM | POA: Diagnosis not present

## 2024-02-10 DIAGNOSIS — F32A Depression, unspecified: Secondary | ICD-10-CM | POA: Diagnosis not present

## 2024-02-10 MED ORDER — LORAZEPAM 1 MG PO TABS: 1.0000 mg | ORAL_TABLET | Freq: Four times a day (QID) | ORAL | Status: DC | PRN

## 2024-02-10 MED ORDER — ALBUTEROL SULFATE HFA 108 (90 BASE) MCG/ACT IN AERS: 1.0000 | INHALATION_SPRAY | Freq: Four times a day (QID) | RESPIRATORY_TRACT | Status: DC | PRN

## 2024-02-10 MED ORDER — LORAZEPAM 1 MG PO TABS: 1.0000 mg | ORAL_TABLET | Freq: Three times a day (TID) | ORAL | Status: DC

## 2024-02-10 MED ORDER — LOPERAMIDE HCL 2 MG PO CAPS: 2.0000 mg | ORAL_CAPSULE | ORAL | Status: DC | PRN

## 2024-02-10 MED ORDER — LORAZEPAM 1 MG PO TABS: 1.0000 mg | ORAL_TABLET | Freq: Every day | ORAL | Status: DC

## 2024-02-10 MED ORDER — LORAZEPAM 1 MG PO TABS: 1.0000 mg | ORAL_TABLET | Freq: Four times a day (QID) | ORAL | Status: DC

## 2024-02-10 MED ORDER — LORAZEPAM 1 MG PO TABS: 1.0000 mg | ORAL_TABLET | Freq: Two times a day (BID) | ORAL | Status: DC

## 2024-02-10 MED ORDER — THIAMINE MONONITRATE 100 MG PO TABS
100.0000 mg | ORAL_TABLET | Freq: Every day | ORAL | Status: DC
  Administered 2024-02-11 – 2024-02-12 (×2): 100 mg via ORAL
  Filled 2024-02-10 (×2): qty 1

## 2024-02-10 MED ORDER — THIAMINE HCL 100 MG/ML IJ SOLN: 100.0000 mg | Freq: Once | INTRAMUSCULAR | Status: DC

## 2024-02-10 MED ORDER — ONDANSETRON 4 MG PO TBDP: 4.0000 mg | ORAL_TABLET | Freq: Four times a day (QID) | ORAL | Status: DC | PRN

## 2024-02-10 NOTE — Group Note (Signed)
 Group Topic: Balance in Life  Group Date: 02/10/2024 Start Time: 1300 End Time: 1345 Facilitators: Alyse Leilani LABOR, NT  Department: [facility]  Number of Participants: 5  Group Focus: abuse issues, acceptance, and activities of daily living skills Treatment Modality:  Skills Training and Solution-Focused Therapy Interventions utilized were group exercise and leisure development Purpose: enhance coping skills and express feelings  Name: John [name] Date of Birth: [birth year]  MR: [id number]    Pt did not come to group. 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

## 2024-02-10 NOTE — ED Notes (Signed)
 Pt approached nurses station requesting medication for anxiety and mild HA 4/10. Pt received Tylenol  and Vistaril . Tolerated well. Informed pt to notify staff with any needs or concerns. Will continue to monitor for safety.

## 2024-02-10 NOTE — ED Notes (Signed)
 Patient is alert and oriented x 4 with no acute distress noted.  Patient currently denies SI/HI/AVH at this time.  Patient offered food and drink at this time.  Patient has been observed watching TV in the dayroom and interacting with peers.  Patient attended AA group during shift.  Will continue Q 15 min safety checks for safety/behavior per facility protocol.

## 2024-02-10 NOTE — ED Notes (Signed)
 Pt did not come to AA

## 2024-02-10 NOTE — ED Provider Notes (Signed)
 Facility Based Crisis Admission H&P  Date: 02/10/2024 Patient Name: John [name] MRN: [id number] Chief Complaint: alcohol abuse with SI  Diagnoses:  Final diagnoses:  Alcohol-induced mood disorder with depressive symptoms (HCC)  GAD (generalized anxiety disorder)   HPI: John [name] is a 43 y.o. male with a history of alcohol use disorder who presented to the ER at [facility] yesterday after an argument with his father with whom John [name] resides during which father kicked him out of the house.    On presentation to the ER, patient reported suicidal ideations, stated that John [name] had nowhere to go, and that if discharged, John [name] would jump off a bridge or run infront of a car, which triggered for a Psychiatry consult to be placed after which a recommendation was made for the [facility]. Pt also reported a history of a past suicide attempt via cutting his wrists, and reported alcohol use, but with no complications from withdrawals.  Assessment & ROS: Patient is irritable during encounter, answering very limited questions, states to writer they asked me the same fucking questions at the hospital, and I do not know why the fuck I've got to answer them again. John [name] persistently is grumpy, stating that John [name] does not want to talk, but reports depressive symptoms of insomnia, poor concentration, poor appetite, feelings of worthlessness, helplessness, hopelessness.   Per chart review, between 3 or 4 40 ounce cans of beer daily. John [name] last drank around 1 AM this morning(morning of 12/16).  John [name] also reported using marijuana.  Denied using any other substances of abuse.  Per chart review mental health history is depression, bipolar disorder, autism spectrum disorder, and chronic alcohol use disorder.  Most of the information is obtained from chart review, since patient would not cooperate with assessment.  Socioeconomic stressors include unemployment, homelessness, financial stressors, social isolation, and a limited support  system.  Patient currently resides with his father who most recently kicked him out of the house.  Patient reported a history of detox from alcohol at the house of prior detox in [place], [place], reported that John [name] had previously obtained a sponsor from AA who was also helpful.  Denied ever being to long-term rehabilitation.  Also as per chart review, patient is chronically noncompliant with his medications, and home medications per chart review are Lexapro  5 mg daily, gabapentin  600 mg 3 times a day, naltrexone  50 mg daily, trazodone  100 mg nightly, and an albuterol  inhaler as needed even though John [name] denies having any medical conditions.  Medications ordered are: Lexapro  5 mg daily for GAD & MDD, Gabapentin  300 mg TID (reduced), Trazodone  100 mg nightly PRN, Librium  is ordered on a PRN basis. Will continue current meds with no adjustments today. See meds as listed below.  Please see below for complete orders & Treatment Plan: Pt currently presents Pt with flat affect and depressed mood, attention to personal hygiene and grooming is fair, eye contact is fair, speech is clear & coherent. Thought contents are organized and logical, and pt currently denies SI/HI/AVH or paranoia. There is no evidence of delusional thoughts. Denies first rank symptoms and there are no overt signs of psychosis.  Writer questioned rationale for current order of Doxycycline , but it looks like it is being substituted for Keflex  for armpit boils.  As per the note by Timothy Gollan, MD on 02/08/2024:  Armpit boils Numerous erythematous raised what appear to be boils in his right armpit Difficulty putting his arm down secondary to discomfort Recommend John [name] take Keflex  500  mg 4 times daily 7 days If no improvement suggested follow-up in urgent care  Plan: Is not interested in inpatient rehabilitation, and would like to be discharged home from here.  PHQ 2-9:  Flowsheet Row ED from 02/09/2024 in [facility] ED from 08/05/2023 in [facility] from 07/19/2019 in [facility]  Thoughts that you would be better off dead, or of hurting yourself in some way Several days Not at all Not at all  PHQ-9 Total Score 9 7 10     Flowsheet Row ED from 02/09/2024 in [facility] Most recent reading at 02/10/2024  4:43 PM ED from 02/09/2024 in [facility] Emergency Department at [facility] Most recent reading at 02/09/2024  8:16 AM ED from 11/20/2023 in [facility] Emergency Department at [facility] Most recent reading at 11/21/2023 11:58 AM  C-SSRS RISK CATEGORY Error: Q3, 4, or 5 should not be populated when Q2 is No No Risk Error: Q3, 4, or 5 should not be populated when Q2 is No    Screenings    Flowsheet Row Most Recent Value  CIWA-Ar Total 1    Total Time spent with patient: 1.5 hours  Musculoskeletal  Strength & Muscle Tone: within normal limits Gait & Station: normal Patient leans: N/A  Psychiatric Specialty Exam  Presentation General Appearance:  Casual; Disheveled  Eye Contact: Fair  Speech: Clear and Coherent  Speech Volume: Normal  Handedness: Right   Mood and Affect  Mood: Depressed; Anxious  Affect: Congruent   Thought Process  Thought Processes: Coherent  Descriptions of Associations:Intact  Orientation:Partial  Thought Content:Logical  Diagnosis of Schizophrenia or Schizoaffective disorder in past: No   Hallucinations:Hallucinations: None  Ideas of Reference:None  Suicidal Thoughts:Suicidal Thoughts: No  Homicidal Thoughts:Homicidal Thoughts: No   Sensorium  Memory: Immediate Fair  Judgment: Fair  Insight: Fair   Art Therapist  Concentration: Fair  Attention Span: Fair  Recall: Fair  Fund of Knowledge: Fair  Language: Fair   Psychomotor Activity  Psychomotor Activity:Psychomotor Activity:  Normal   Assets  Assets: Resilience   Sleep  Sleep:Sleep: Fair   Nutritional Assessment (For OBS and FBC admissions only) Has the patient had a weight loss or gain of 10 pounds or more in the last 3 months?: No Has the patient had a decrease in food intake/or appetite?: No Does the patient have dental problems?: No Does the patient have eating habits or behaviors that may be indicators of an eating disorder including binging or inducing vomiting?: No Has the patient recently lost weight without trying?: 0 Has the patient been eating poorly because of a decreased appetite?: 0 Malnutrition Screening Tool Score: 0    Physical Exam Vitals reviewed.  HENT:     Head: Normocephalic.    Review of Systems  Psychiatric/Behavioral:  Positive for depression and substance abuse. Negative for hallucinations, memory loss and suicidal ideas. The patient is nervous/anxious and has insomnia.   All other systems reviewed and are negative.   Blood pressure 105/72, pulse 77, temperature 98 F (36.7 C), temperature source Oral, resp. rate 18, SpO2 97%. There is no height or weight on file to calculate BMI.  Is the patient at risk to self? No  Has the patient been a risk to self in the past 6 months? No .    Has the patient been a risk to self within the distant past? No   Is the  patient a risk to others? No   Has the patient been a risk to others in the past 6 months? No   Has the patient been a risk to others within the distant past? No   Past Medical History: denies  Family History: denies  Last Labs:  Admission on 02/09/2024, Discharged on 02/09/2024  Component Date Value Ref Range Status   Sodium 02/09/2024 140  135 - 145 mmol/L Final   Potassium 02/09/2024 3.8  3.5 - 5.1 mmol/L Final   Chloride 02/09/2024 99  98 - 111 mmol/L Final   CO2 02/09/2024 24  22 - 32 mmol/L Final   Glucose, Bld 02/09/2024 90  70 - 99 mg/dL Final   Glucose reference range applies only to samples taken  after fasting for at least 8 hours.   BUN 02/09/2024 5 (L)  6 - 20 mg/dL Final   Creatinine, Ser 02/09/2024 0.72  0.61 - 1.24 mg/dL Final   Calcium  02/09/2024 9.9  8.9 - 10.3 mg/dL Final   Total Protein 87/83/7974 8.7 (H)  6.5 - 8.1 g/dL Final   Albumin 87/83/7974 4.8  3.5 - 5.0 g/dL Final   AST 87/83/7974 25  15 - 41 U/L Final   ALT 02/09/2024 9  0 - 44 U/L Final   Alkaline Phosphatase 02/09/2024 85  38 - 126 U/L Final   Total Bilirubin 02/09/2024 0.3  0.0 - 1.2 mg/dL Final   GFR, Estimated 02/09/2024 >60  >60 mL/min Final   Comment: (NOTE) Calculated using the CKD-EPI Creatinine Equation (2021)    Anion gap 02/09/2024 16 (H)  5 - 15 Final   Performed at [facility], [address], [place], [place] [address]   Alcohol, Ethyl (B) 02/09/2024 87 (H)  <15 mg/dL Final   Comment: (NOTE) For medical purposes only. Performed at [facility], [address] [place], [place] [address]    WBC 02/09/2024 13.1 (H)  4.0 - 10.5 K/uL Final   RBC 02/09/2024 5.35  4.22 - 5.81 MIL/uL Final   Hemoglobin 02/09/2024 16.1  13.0 - 17.0 g/dL Final   HCT 87/83/7974 48.6  39.0 - 52.0 % Final   MCV 02/09/2024 90.8  80.0 - 100.0 fL Final   MCH 02/09/2024 30.1  26.0 - 34.0 pg Final   MCHC 02/09/2024 33.1  30.0 - 36.0 g/dL Final   RDW 87/83/7974 12.9  11.5 - 15.5 % Final   Platelets 02/09/2024 321  150 - 400 K/uL Final   nRBC 02/09/2024 0.0  0.0 - 0.2 % Final   Performed at [facility], [address] [place], [place] [address]   Opiates 02/09/2024 NEGATIVE  NEGATIVE Final   Cocaine 02/09/2024 NEGATIVE  NEGATIVE Final   Benzodiazepines 02/09/2024 NEGATIVE  NEGATIVE Final   Amphetamines 02/09/2024 NEGATIVE  NEGATIVE Final   Tetrahydrocannabinol 02/09/2024 POSITIVE (A)  NEGATIVE Final   Barbiturates 02/09/2024 NEGATIVE  NEGATIVE Final   Methadone Scn, Ur 02/09/2024 NEGATIVE  NEGATIVE Final   Fentanyl  02/09/2024 NEGATIVE  NEGATIVE Final   Comment:  (NOTE) Drug screen is for Medical Purposes only. Positive results are preliminary only. If confirmation is needed, notify lab within 5 days.  Drug Class                 Cutoff (ng/mL) Amphetamine and metabolites 1000 Barbiturate and metabolites 200 Benzodiazepine              200 Opiates and metabolites     300 Cocaine and metabolites  300 THC                         50 Fentanyl                     5 Methadone                   300  Trazodone  is metabolized in vivo to several metabolites,  including pharmacologically active m-CPP, which is excreted in the  urine.  Immunoassay screens for amphetamines and MDMA have potential  cross-reactivity with these compounds and may provide false positive  result.  Performed at [facility], [address] [place], [place] [address]   Clinical Support on 01/06/2024  Component Date Value Ref Range Status   Hepatitis B Surface Ag 01/06/2024 Non Reactive   Final   Hepatitis B Surface Ag 01/06/2024 Non Reactive   Final   Hep B Core Total Ab 01/06/2024 Non Reactive   Final  Admission on 11/20/2023, Discharged on 11/21/2023  Component Date Value Ref Range Status   Sodium 11/20/2023 139  135 - 145 mmol/L Final   Potassium 11/20/2023 3.4 (L)  3.5 - 5.1 mmol/L Final   Chloride 11/20/2023 105  98 - 111 mmol/L Final   CO2 11/20/2023 22  22 - 32 mmol/L Final   Glucose, Bld 11/20/2023 99  70 - 99 mg/dL Final   Glucose reference range applies only to samples taken after fasting for at least 8 hours.   BUN 11/20/2023 6  6 - 20 mg/dL Final   Creatinine, Ser 11/20/2023 0.65  0.61 - 1.24 mg/dL Final   Calcium  11/20/2023 8.5 (L)  8.9 - 10.3 mg/dL Final   Total Protein 90/73/7974 8.0  6.5 - 8.1 g/dL Final   Albumin 90/73/7974 3.7  3.5 - 5.0 g/dL Final   AST 90/73/7974 24  15 - 41 U/L Final   ALT 11/20/2023 20  0 - 44 U/L Final   Alkaline Phosphatase 11/20/2023 55  38 - 126 U/L Final   Total Bilirubin 11/20/2023 0.4  0.0 - 1.2 mg/dL  Final   GFR, Estimated 11/20/2023 >60  >60 mL/min Final   Comment: (NOTE) Calculated using the CKD-EPI Creatinine Equation (2021)    Anion gap 11/20/2023 12  5 - 15 Final   Performed at [facility], [address] [place], [place] [address]   Alcohol, Ethyl (B) 11/20/2023 159 (H)  <15 mg/dL Final   Comment: (NOTE) For medical purposes only. Performed at [facility], [address] [place], [place] [address]    WBC 11/20/2023 7.9  4.0 - 10.5 K/uL Final   RBC 11/20/2023 4.68  4.22 - 5.81 MIL/uL Final   Hemoglobin 11/20/2023 13.9  13.0 - 17.0 g/dL Final   HCT 90/73/7974 42.5  39.0 - 52.0 % Final   MCV 11/20/2023 90.8  80.0 - 100.0 fL Final   MCH 11/20/2023 29.7  26.0 - 34.0 pg Final   MCHC 11/20/2023 32.7  30.0 - 36.0 g/dL Final   RDW 90/73/7974 15.1  11.5 - 15.5 % Final   Platelets 11/20/2023 284  150 - 400 K/uL Final   nRBC 11/20/2023 0.0  0.0 - 0.2 % Final   Neutrophils Relative % 11/20/2023 57  % Final   Neutro Abs 11/20/2023 4.6  1.7 - 7.7 K/uL Final   Lymphocytes Relative 11/20/2023 34  % Final   Lymphs Abs 11/20/2023 2.7  0.7 - 4.0 K/uL Final  Monocytes Relative 11/20/2023 7  % Final   Monocytes Absolute 11/20/2023 0.5  0.1 - 1.0 K/uL Final   Eosinophils Relative 11/20/2023 1  % Final   Eosinophils Absolute 11/20/2023 0.1  0.0 - 0.5 K/uL Final   Basophils Relative 11/20/2023 1  % Final   Basophils Absolute 11/20/2023 0.1  0.0 - 0.1 K/uL Final   Immature Granulocytes 11/20/2023 0  % Final   Abs Immature Granulocytes 11/20/2023 0.03  0.00 - 0.07 K/uL Final   Performed at [facility], [address] [place], [place] [address]  Admission on 11/12/2023, Discharged on 11/12/2023  Component Date Value Ref Range Status   WBC 11/12/2023 8.7  4.0 - 10.5 K/uL Final   RBC 11/12/2023 4.33  4.22 - 5.81 MIL/uL Final   Hemoglobin 11/12/2023 13.4  13.0 - 17.0 g/dL Final   HCT 90/81/7974 40.4  39.0 - 52.0 % Final   MCV 11/12/2023 93.3  80.0 - 100.0 fL  Final   MCH 11/12/2023 30.9  26.0 - 34.0 pg Final   MCHC 11/12/2023 33.2  30.0 - 36.0 g/dL Final   RDW 90/81/7974 14.8  11.5 - 15.5 % Final   Platelets 11/12/2023 389  150 - 400 K/uL Final   nRBC 11/12/2023 0.0  0.0 - 0.2 % Final   Neutrophils Relative % 11/12/2023 55  % Final   Neutro Abs 11/12/2023 5.0  1.7 - 7.7 K/uL Final   Lymphocytes Relative 11/12/2023 33  % Final   Lymphs Abs 11/12/2023 2.8  0.7 - 4.0 K/uL Final   Monocytes Relative 11/12/2023 9  % Final   Monocytes Absolute 11/12/2023 0.7  0.1 - 1.0 K/uL Final   Eosinophils Relative 11/12/2023 1  % Final   Eosinophils Absolute 11/12/2023 0.1  0.0 - 0.5 K/uL Final   Basophils Relative 11/12/2023 1  % Final   Basophils Absolute 11/12/2023 0.1  0.0 - 0.1 K/uL Final   WBC Morphology 11/12/2023 See Note   Final   Mild Left Shift (1-5% metas, occ myelo)   RBC Morphology 11/12/2023 See Note   Final   MIXED RBC POPULATION   Smear Review 11/12/2023 Normal platelet morphology   Final   Immature Granulocytes 11/12/2023 1  % Final   Abs Immature Granulocytes 11/12/2023 0.06  0.00 - 0.07 K/uL Final   Performed at [facility], [address] [place], [place] [address]   Sodium 11/12/2023 140  135 - 145 mmol/L Final   Potassium 11/12/2023 3.3 (L)  3.5 - 5.1 mmol/L Final   Chloride 11/12/2023 107  98 - 111 mmol/L Final   CO2 11/12/2023 24  22 - 32 mmol/L Final   Glucose, Bld 11/12/2023 98  70 - 99 mg/dL Final   Glucose reference range applies only to samples taken after fasting for at least 8 hours.   BUN 11/12/2023 9  6 - 20 mg/dL Final   Creatinine, Ser 11/12/2023 0.70  0.61 - 1.24 mg/dL Final   Calcium  11/12/2023 8.2 (L)  8.9 - 10.3 mg/dL Final   GFR, Estimated 11/12/2023 >60  >60 mL/min Final   Comment: (NOTE) Calculated using the CKD-EPI Creatinine Equation (2021)    Anion gap 11/12/2023 9  5 - 15 Final   Performed at [facility], [address] [place], [place] [address]   Alcohol, Ethyl (B) 11/12/2023  140 (H)  <15 mg/dL Final   Comment: (NOTE) For medical purposes only. Performed at [facility], [address], [place], [place] [address]  Acetaminophen  (Tylenol ), Serum 11/12/2023 <10 (L)  10 - 30 ug/mL Final   Comment: (NOTE) Therapeutic concentrations vary significantly. A range of 10-30 ug/mL  may be an effective concentration for many patients. However, some  are best treated at concentrations outside of this range. Acetaminophen  concentrations >150 ug/mL at 4 hours after ingestion  and >50 ug/mL at 12 hours after ingestion are often associated with  toxic reactions.  Performed at [facility], [address] [place], [place] [address]    Salicylate Lvl 11/12/2023 <7.0 (L)  7.0 - 30.0 mg/dL Final   Performed at [facility], [address] [place], [place] [address]   Troponin I (High Sensitivity) 11/12/2023 4  <18 ng/L Final   Comment: (NOTE) Elevated high sensitivity troponin I (hsTnI) values and significant  changes across serial measurements may suggest ACS but many other  chronic and acute conditions are known to elevate hsTnI results.  Refer to the Links section for chest pain algorithms and additional  guidance. Performed at [facility], [address] [place], [place] [address]    Tricyclic, Ur Screen 11/12/2023 NONE DETECTED  NONE DETECTED Final   Amphetamines, Ur Screen 11/12/2023 NONE DETECTED  NONE DETECTED Final   MDMA (Ecstasy)Ur Screen 11/12/2023 NONE DETECTED  NONE DETECTED Final   Cocaine Metabolite,Ur [place] 11/12/2023 NONE DETECTED  NONE DETECTED Final   Opiate, Ur Screen 11/12/2023 NONE DETECTED  NONE DETECTED Final   Phencyclidine (PCP) Ur S 11/12/2023 NONE DETECTED  NONE DETECTED Final   Cannabinoid 50 Ng, Ur [place] 11/12/2023 POSITIVE (A)  NONE DETECTED Final   Barbiturates, Ur Screen 11/12/2023 NONE DETECTED  NONE DETECTED Final   Benzodiazepine, Ur Scrn 11/12/2023 NONE DETECTED  NONE DETECTED Final    Methadone Scn, Ur 11/12/2023 NONE DETECTED  NONE DETECTED Final   Comment: (NOTE) Tricyclics + metabolites, urine    Cutoff 1000 ng/mL Amphetamines + metabolites, urine  Cutoff 1000 ng/mL MDMA (Ecstasy), urine              Cutoff 500 ng/mL Cocaine Metabolite, urine          Cutoff 300 ng/mL Opiate + metabolites, urine        Cutoff 300 ng/mL Phencyclidine (PCP), urine         Cutoff 25 ng/mL Cannabinoid, urine                 Cutoff 50 ng/mL Barbiturates + metabolites, urine  Cutoff 200 ng/mL Benzodiazepine, urine              Cutoff 200 ng/mL Methadone, urine                   Cutoff 300 ng/mL  The urine drug screen provides only a preliminary, unconfirmed analytical test result and should not be used for non-medical purposes. Clinical consideration and professional judgment should be applied to any positive drug screen result due to possible interfering substances. A more specific alternate chemical method must be used in order to obtain a confirmed analytical result. Gas chromatography / mass spectrometry (GC/MS) is the preferred confirm                          atory method. Performed at [facility], [address] [place], [place] [address]    Color, Urine 11/12/2023 YELLOW (A)  YELLOW Final   APPearance 11/12/2023 CLEAR (A)  CLEAR Final   Specific Gravity, Urine 11/12/2023 1.019  1.005 -  1.030 Final   pH 11/12/2023 5.0  5.0 - 8.0 Final   Glucose, UA 11/12/2023 NEGATIVE  NEGATIVE mg/dL Final   Hgb urine dipstick 11/12/2023 NEGATIVE  NEGATIVE Final   Bilirubin Urine 11/12/2023 NEGATIVE  NEGATIVE Final   Ketones, ur 11/12/2023 NEGATIVE  NEGATIVE mg/dL Final   Protein, ur 90/81/7974 NEGATIVE  NEGATIVE mg/dL Final   Nitrite 90/81/7974 NEGATIVE  NEGATIVE Final   Leukocytes,Ua 11/12/2023 NEGATIVE  NEGATIVE Final   Performed at [facility], [address] [place], [place] [address]   Troponin I (High Sensitivity) 11/12/2023 4  <18 ng/L Final   Comment:  (NOTE) Elevated high sensitivity troponin I (hsTnI) values and significant  changes across serial measurements may suggest ACS but many other  chronic and acute conditions are known to elevate hsTnI results.  Refer to the Links section for chest pain algorithms and additional  guidance. Performed at [facility], [address] [place], [place] [address]    B Natriuretic Peptide 11/12/2023 6.2  0.0 - 100.0 pg/mL Final   Performed at [facility], [address] [place], [place] [address]  Admission on 11/06/2023, Discharged on 11/10/2023  Component Date Value Ref Range Status   SARS Coronavirus 2 by RT PCR 11/09/2023 NEGATIVE  NEGATIVE Final   Comment: (NOTE) SARS-CoV-2 target nucleic acids are NOT DETECTED.  The SARS-CoV-2 RNA is generally detectable in upper respiratory specimens during the acute phase of infection. The lowest concentration of SARS-CoV-2 viral copies this assay can detect is 138 copies/mL. A negative result does not preclude SARS-Cov-2 infection and should not be used as the sole basis for treatment or other patient management decisions. A negative result may occur with  improper specimen collection/handling, submission of specimen other than nasopharyngeal swab, presence of viral mutation(s) within the areas targeted by this assay, and inadequate number of viral copies(<138 copies/mL). A negative result must be combined with clinical observations, patient history, and epidemiological information. The expected result is Negative.  Fact Sheet for Patients:  bloggercourse.com  Fact Sheet for Healthcare Providers:  seriousbroker.it  This test is no                          t yet approved or cleared by the [place]  FDA and  has been authorized for detection and/or diagnosis of SARS-CoV-2 by FDA under an Emergency Use Authorization (EUA). This EUA will remain  in effect (meaning this  test can be used) for the duration of the COVID-19 declaration under Section 564(b)(1) of the Act, 21 U.S.C.section 360bbb-3(b)(1), unless the authorization is terminated  or revoked sooner.       Influenza A by PCR 11/09/2023 NEGATIVE  NEGATIVE Final   Influenza B by PCR 11/09/2023 NEGATIVE  NEGATIVE Final   Comment: (NOTE) The Xpert Xpress SARS-CoV-2/FLU/RSV plus assay is intended as an aid in the diagnosis of influenza from Nasopharyngeal swab specimens and should not be used as a sole basis for treatment. Nasal washings and aspirates are unacceptable for Xpert Xpress SARS-CoV-2/FLU/RSV testing.  Fact Sheet for Patients: bloggercourse.com  Fact Sheet for Healthcare Providers: seriousbroker.it  This test is not yet approved or cleared by the [place]  FDA and has been authorized for detection and/or diagnosis of SARS-CoV-2 by FDA under an Emergency Use Authorization (EUA). This EUA will remain in effect (meaning this test can be used) for the duration of the COVID-19 declaration under Section 564(b)(1) of the Act, 21 U.S.C. section 360bbb-3(b)(1), unless  the authorization is terminated or revoked.     Resp Syncytial Virus by PCR 11/09/2023 NEGATIVE  NEGATIVE Final   Comment: (NOTE) Fact Sheet for Patients: bloggercourse.com  Fact Sheet for Healthcare Providers: seriousbroker.it  This test is not yet approved or cleared by the [place]  FDA and has been authorized for detection and/or diagnosis of SARS-CoV-2 by FDA under an Emergency Use Authorization (EUA). This EUA will remain in effect (meaning this test can be used) for the duration of the COVID-19 declaration under Section 564(b)(1) of the Act, 21 U.S.C. section 360bbb-3(b)(1), unless the authorization is terminated or revoked.  Performed at [facility], [address] [place],  [place] [address]   Admission on 11/05/2023, Discharged on 11/06/2023  Component Date Value Ref Range Status   Sodium 11/05/2023 139  135 - 145 mmol/L Final   Potassium 11/05/2023 3.8  3.5 - 5.1 mmol/L Final   Chloride 11/05/2023 101  98 - 111 mmol/L Final   CO2 11/05/2023 27  22 - 32 mmol/L Final   Glucose, Bld 11/05/2023 79  70 - 99 mg/dL Final   Glucose reference range applies only to samples taken after fasting for at least 8 hours.   BUN 11/05/2023 <5 (L)  6 - 20 mg/dL Final   Creatinine, Ser 11/05/2023 0.59 (L)  0.61 - 1.24 mg/dL Final   Calcium  11/05/2023 9.7  8.9 - 10.3 mg/dL Final   Total Protein 90/88/7974 9.0 (H)  6.5 - 8.1 g/dL Final   Albumin 90/88/7974 3.8  3.5 - 5.0 g/dL Final   AST 90/88/7974 24  15 - 41 U/L Final   ALT 11/05/2023 11  0 - 44 U/L Final   Alkaline Phosphatase 11/05/2023 89  38 - 126 U/L Final   Total Bilirubin 11/05/2023 0.3  0.0 - 1.2 mg/dL Final   GFR, Estimated 11/05/2023 >60  >60 mL/min Final   Comment: (NOTE) Calculated using the CKD-EPI Creatinine Equation (2021)    Anion gap 11/05/2023 12  5 - 15 Final   Performed at [facility], [address], [place], [place] [address]   Alcohol, Ethyl (B) 11/05/2023 192 (H)  <15 mg/dL Final   Comment: (NOTE) For medical purposes only. Performed at [facility], [address], [place], [place] [address]    Opiates 11/05/2023 NEGATIVE  NEGATIVE Final   Cocaine 11/05/2023 NEGATIVE  NEGATIVE Final   Benzodiazepines 11/05/2023 NEGATIVE  NEGATIVE Final   Amphetamines 11/05/2023 NEGATIVE  NEGATIVE Final   Tetrahydrocannabinol 11/05/2023 NEGATIVE  NEGATIVE Final   Barbiturates 11/05/2023 NEGATIVE  NEGATIVE Final   Methadone Scn, Ur 11/05/2023 NEGATIVE  NEGATIVE Final   Fentanyl  11/05/2023 NEGATIVE  NEGATIVE Final   Comment: (NOTE) Drug screen is for Medical Purposes only. Positive results are preliminary only. If confirmation is needed, notify lab within 5 days.  Drug Class                  Cutoff (ng/mL) Amphetamine and metabolites 1000 Barbiturate and metabolites 200 Benzodiazepine              200 Opiates and metabolites     300 Cocaine and metabolites     300 THC                         50 Fentanyl                     5 Methadone  300  Trazodone  is metabolized in vivo to several metabolites,  including pharmacologically active m-CPP, which is excreted in the  urine.  Immunoassay screens for amphetamines and MDMA have potential  cross-reactivity with these compounds and may provide false positive  result.  Performed at [facility], [address], [place], [place] [address]    WBC 11/05/2023 12.5 (H)  4.0 - 10.5 K/uL Final   RBC 11/05/2023 4.90  4.22 - 5.81 MIL/uL Final   Hemoglobin 11/05/2023 14.2  13.0 - 17.0 g/dL Final   HCT 90/88/7974 45.6  39.0 - 52.0 % Final   MCV 11/05/2023 93.1  80.0 - 100.0 fL Final   MCH 11/05/2023 29.0  26.0 - 34.0 pg Final   MCHC 11/05/2023 31.1  30.0 - 36.0 g/dL Final   RDW 90/88/7974 14.5  11.5 - 15.5 % Final   Platelets 11/05/2023 464 (H)  150 - 400 K/uL Final   nRBC 11/05/2023 0.0  0.0 - 0.2 % Final   Neutrophils Relative % 11/05/2023 69  % Final   Neutro Abs 11/05/2023 8.6 (H)  1.7 - 7.7 K/uL Final   Lymphocytes Relative 11/05/2023 23  % Final   Lymphs Abs 11/05/2023 2.8  0.7 - 4.0 K/uL Final   Monocytes Relative 11/05/2023 6  % Final   Monocytes Absolute 11/05/2023 0.8  0.1 - 1.0 K/uL Final   Eosinophils Relative 11/05/2023 0  % Final   Eosinophils Absolute 11/05/2023 0.1  0.0 - 0.5 K/uL Final   Basophils Relative 11/05/2023 1  % Final   Basophils Absolute 11/05/2023 0.1  0.0 - 0.1 K/uL Final   Immature Granulocytes 11/05/2023 1  % Final   Abs Immature Granulocytes 11/05/2023 0.11 (H)  0.00 - 0.07 K/uL Final   Performed at [facility], [address] [place], [place] [address]   Acetaminophen  (Tylenol ), Serum 11/05/2023 <10 (L)  10 - 30 ug/mL Final    Comment: (NOTE) Toxic concentrations can be more effectively related to post dose interval; > 200, > 100, and > 50 ug/mL serum concentrations correspond to toxic concentrations at 4, 8, and 12 hours post dose, respectively.  Performed at [facility], [address] [place], [place] [address]    Salicylate Lvl 11/05/2023 <7.0 (L)  7.0 - 30.0 mg/dL Final   Performed at [facility], [address] [place], [place] [address]   Troponin T High Sensitivity 11/05/2023 <15  0 - 19 ng/L Final   Comment: (NOTE) Biotin concentrations > 1000 ng/mL falsely decrease TnT results.  Serial cardiac troponin measurements are suggested.  Refer to the Links section for chest pain algorithms and additional  guidance. Performed at [facility], [address], [place], [place] [address]   Admission on 08/23/2023, Discharged on 08/25/2023  Component Date Value Ref Range Status   Sodium 08/23/2023 136  135 - 145 mmol/L Final   Potassium 08/23/2023 3.5  3.5 - 5.1 mmol/L Final   Chloride 08/23/2023 99  98 - 111 mmol/L Final   CO2 08/23/2023 21 (L)  22 - 32 mmol/L Final   Glucose, Bld 08/23/2023 79  70 - 99 mg/dL Final   Glucose reference range applies only to samples taken after fasting for at least 8 hours.   BUN 08/23/2023 7  6 - 20 mg/dL Final   Creatinine, Ser 08/23/2023 0.66  0.61 - 1.24 mg/dL Final   Calcium  08/23/2023 8.8 (L)  8.9 - 10.3 mg/dL Final   Total Protein  08/23/2023 7.6  6.5 - 8.1 g/dL Final   Albumin 93/70/7974 4.2  3.5 - 5.0 g/dL Final   AST 93/70/7974 41  15 - 41 U/L Final   ALT 08/23/2023 25  0 - 44 U/L Final   Alkaline Phosphatase 08/23/2023 72  38 - 126 U/L Final   Total Bilirubin 08/23/2023 1.0  0.0 - 1.2 mg/dL Final   GFR, Estimated 08/23/2023 >60  >60 mL/min Final   Comment: (NOTE) Calculated using the CKD-EPI Creatinine Equation (2021)    Anion gap 08/23/2023 16 (H)  5 - 15 Final   Performed at [facility], [address], [place], [place] [address]   WBC 08/23/2023 9.9  4.0 - 10.5 K/uL Final   RBC 08/23/2023 5.15  4.22 - 5.81 MIL/uL Final   Hemoglobin 08/23/2023 16.2  13.0 - 17.0 g/dL Final   HCT 93/70/7974 47.7  39.0 - 52.0 % Final   MCV 08/23/2023 92.6  80.0 - 100.0 fL Final   MCH 08/23/2023 31.5  26.0 - 34.0 pg Final   MCHC 08/23/2023 34.0  30.0 - 36.0 g/dL Final   RDW 93/70/7974 14.6  11.5 - 15.5 % Final   Platelets 08/23/2023 214  150 - 400 K/uL Final   nRBC 08/23/2023 0.0  0.0 - 0.2 % Final   Neutrophils Relative % 08/23/2023 71  % Final   Neutro Abs 08/23/2023 7.0  1.7 - 7.7 K/uL Final   Lymphocytes Relative 08/23/2023 22  % Final   Lymphs Abs 08/23/2023 2.2  0.7 - 4.0 K/uL Final   Monocytes Relative 08/23/2023 6  % Final   Monocytes Absolute 08/23/2023 0.6  0.1 - 1.0 K/uL Final   Eosinophils Relative 08/23/2023 0  % Final   Eosinophils Absolute 08/23/2023 0.0  0.0 - 0.5 K/uL Final   Basophils Relative 08/23/2023 1  % Final   Basophils Absolute 08/23/2023 0.1  0.0 - 0.1 K/uL Final   Immature Granulocytes 08/23/2023 0  % Final   Abs Immature Granulocytes 08/23/2023 0.04  0.00 - 0.07 K/uL Final   Performed at [facility], [address] [place], [place] [address]   Alcohol, Ethyl (B) 08/23/2023 35 (H)  <15 mg/dL Final   Comment: (NOTE) For medical purposes only. Performed at [facility], [address], [place], [place] [address]    Opiates 08/23/2023 NONE DETECTED  NONE DETECTED Final   Cocaine 08/23/2023 NONE DETECTED  NONE DETECTED Final   Benzodiazepines 08/23/2023 POSITIVE (A)  NONE DETECTED Final   Amphetamines 08/23/2023 NONE DETECTED  NONE DETECTED Final   Tetrahydrocannabinol 08/23/2023 NONE DETECTED  NONE DETECTED Final   Barbiturates 08/23/2023 NONE DETECTED  NONE DETECTED Final   Comment: (NOTE) DRUG SCREEN FOR MEDICAL PURPOSES ONLY.  IF CONFIRMATION IS NEEDED FOR ANY PURPOSE, NOTIFY LAB WITHIN 5  DAYS.  LOWEST DETECTABLE LIMITS FOR URINE DRUG SCREEN Drug Class                     Cutoff (ng/mL) Amphetamine and metabolites    1000 Barbiturate and metabolites    200 Benzodiazepine                 200 Opiates and metabolites        300 Cocaine and metabolites        300 THC  50 Performed at [facility], [address], [place], [place] [address]    HIV Screen 4th Generation wRfx 08/23/2023 Non Reactive  Non Reactive Final   Performed at [facility], [address], [place], [place] [address]   WBC 08/24/2023 7.7  4.0 - 10.5 K/uL Final   RBC 08/24/2023 4.77  4.22 - 5.81 MIL/uL Final   Hemoglobin 08/24/2023 15.1  13.0 - 17.0 g/dL Final   HCT 93/69/7974 46.0  39.0 - 52.0 % Final   MCV 08/24/2023 96.4  80.0 - 100.0 fL Final   MCH 08/24/2023 31.7  26.0 - 34.0 pg Final   MCHC 08/24/2023 32.8  30.0 - 36.0 g/dL Final   RDW 93/69/7974 14.7  11.5 - 15.5 % Final   Platelets 08/24/2023 198  150 - 400 K/uL Final   nRBC 08/24/2023 0.0  0.0 - 0.2 % Final   Performed at [facility], [address] [place], [place] [address]   Sodium 08/24/2023 138  135 - 145 mmol/L Final   Potassium 08/24/2023 3.7  3.5 - 5.1 mmol/L Final   Chloride 08/24/2023 103  98 - 111 mmol/L Final   CO2 08/24/2023 27  22 - 32 mmol/L Final   Glucose, Bld 08/24/2023 88  70 - 99 mg/dL Final   Glucose reference range applies only to samples taken after fasting for at least 8 hours.   BUN 08/24/2023 10  6 - 20 mg/dL Final   Creatinine, Ser 08/24/2023 0.75  0.61 - 1.24 mg/dL Final   Calcium  08/24/2023 9.2  8.9 - 10.3 mg/dL Final   GFR, Estimated 08/24/2023 >60  >60 mL/min Final   Comment: (NOTE) Calculated using the CKD-EPI Creatinine Equation (2021)    Anion gap 08/24/2023 8  5 - 15 Final   Performed at [facility], [address] [place], [place] [address]   Magnesium  08/24/2023 2.0  1.7 - 2.4 mg/dL Final   Performed at  [facility], [address] [place], [place] [address]   Phosphorus 08/24/2023 4.1  2.5 - 4.6 mg/dL Final   Performed at [facility], [address] [place], [place] [address]   MRSA by PCR Next Gen 08/24/2023 NOT DETECTED  NOT DETECTED Final   Comment: (NOTE) The GeneXpert MRSA Assay (FDA approved for NASAL specimens only), is one component of a comprehensive MRSA colonization surveillance program. It is not intended to diagnose MRSA infection nor to guide or monitor treatment for MRSA infections. Test performance is not FDA approved in patients less than 75 years old. Performed at [facility], [address], [place], [place] [address]     Allergies: Penicillins  Medications:  Facility Ordered Medications  Medication   [COMPLETED] thiamine  (VITAMIN B1) tablet 100 mg   chlordiazePOXIDE  (LIBRIUM ) capsule 25 mg   doxycycline  (VIBRA -TABS) tablet 100 mg   hydrOXYzine  (ATARAX ) tablet 25 mg   nicotine  (NICODERM CQ  - dosed in mg/24 hours) patch 21 mg   escitalopram  (LEXAPRO ) tablet 5 mg   gabapentin  (NEURONTIN ) capsule 300 mg   hydrOXYzine  (ATARAX ) tablet 50 mg   traZODone  (DESYREL ) tablet 100 mg   acetaminophen  (TYLENOL ) tablet 650 mg   alum & mag hydroxide-simeth (MAALOX/MYLANTA) 200-200-20 MG/5ML suspension 30 mL   magnesium  hydroxide (MILK OF MAGNESIA) suspension 30 mL   haloperidol  (HALDOL ) tablet 5 mg   And   diphenhydrAMINE  (BENADRYL ) capsule 50 mg   haloperidol  lactate (HALDOL ) injection 5 mg   And  diphenhydrAMINE  (BENADRYL ) injection 50 mg   And   LORazepam  (ATIVAN ) injection 2 mg   haloperidol  lactate (HALDOL ) injection 10 mg   And   diphenhydrAMINE  (BENADRYL ) injection 50 mg   And   LORazepam  (ATIVAN ) injection 2 mg   [START ON 02/11/2024] thiamine  (VITAMIN B1) tablet 100 mg   loperamide  (IMODIUM ) capsule 2-4 mg   ondansetron  (ZOFRAN -ODT) disintegrating tablet 4 mg   albuterol  (VENTOLIN  HFA) 108 (90  Base) MCG/ACT inhaler 1-2 puff   PTA Medications  Medication Sig   nicotine  (NICODERM CQ  - DOSED IN MG/24 HOURS) 14 mg/24hr patch Place 1 patch (14 mg total) onto the skin daily.   traZODone  (DESYREL ) 100 MG tablet Take 1 tablet (100 mg total) by mouth at bedtime as needed for sleep. (Patient not taking: Reported on 02/09/2024)   thiamine  (VITAMIN B-1) 100 MG tablet Take 1 tablet (100 mg total) by mouth daily. (Patient not taking: Reported on 02/09/2024)   naltrexone  (DEPADE) 50 MG tablet Take 1 tablet (50 mg total) by mouth daily. (Patient not taking: Reported on 02/09/2024)   albuterol  (VENTOLIN  HFA) 108 (90 Base) MCG/ACT inhaler Inhale 2 puffs into the lungs every 6 (six) hours as needed for wheezing or shortness of breath.   ibuprofen  (ADVIL ) 400 MG tablet Take 400 mg by mouth every 6 (six) hours as needed. (Patient not taking: Reported on 02/09/2024)   cephALEXin  (KEFLEX ) 500 MG capsule Take 1 capsule (500 mg total) by mouth 4 (four) times daily. (Patient not taking: Reported on 02/09/2024)   escitalopram  (LEXAPRO ) 5 MG tablet ESCITALOPRAM  OXALATE 5 MG TABS (Patient not taking: Reported on 02/09/2024)   hydrOXYzine  (ATARAX ) 50 MG tablet Take 50 mg by mouth daily as needed for anxiety.    Long Term Goals: Improvement in symptoms so as ready for discharge  Short Term Goals: Patient will verbalize feelings in meetings with treatment team members., Patient will attend at least of 50% of the groups daily., Pt will complete the PHQ9 on admission, day 3 and discharge., Patient will participate in completing the [place] Suicide Severity Rating Scale, Patient will score a low risk of violence for 24 hours prior to discharge, and Patient will take medications as prescribed daily.  Medical Decision Making  Continue [facility] admission, plan to discharge patient on 12/18, unless John [name] changes his mind and wants rehabilitation. Medications  doxycycline   100 mg Oral Q12H   escitalopram   5 mg Oral Daily    gabapentin   300 mg Oral Q8H   nicotine   21 mg Transdermal Daily   [START ON 02/11/2024] thiamine   100 mg Oral Daily    acetaminophen , albuterol , alum & mag hydroxide-simeth, chlordiazePOXIDE , haloperidol  **AND** diphenhydrAMINE , haloperidol  lactate **AND** diphenhydrAMINE  **AND** LORazepam , haloperidol  lactate **AND** diphenhydrAMINE  **AND** LORazepam , hydrOXYzine , hydrOXYzine , loperamide , magnesium  hydroxide, ondansetron , traZODone    Labs Reviewed: Ordering baseline labs, TSH, hemoglobin A1c, lipid panel, vitamin D, B12, UA. EKG with Qtc 477.   Recommendations  Based on my evaluation the patient does not appear to have an emergency medical condition.  Donia Snell, NP 02/10/2024  5:01 PM

## 2024-02-10 NOTE — Progress Notes (Signed)
 Patient is resting at this time with no acute distress noted.  Respirations present, even and unlabored.  No current issues noted.  Will continue Q 15 min safety checks for safety/behavior per facility protocol.

## 2024-02-10 NOTE — ED Notes (Signed)
 Safety maintained .  Pat sleeping long periods. In no apparent distreass.  Safety maintained.

## 2024-02-10 NOTE — ED Notes (Signed)
 Pt compliant with Hs. Medications ,  Maintained on antibiotic therapy d/t to I?D site to left axillae.  CIWA score =2 safety maintained.

## 2024-02-10 NOTE — ED Notes (Signed)

## 2024-02-10 NOTE — Group Note (Signed)
 Group Topic: Relapse and Recovery  Group Date: 02/10/2024 Start Time: 1100 End Time: 1200 Facilitators: Chantavia Bazzle, LCSW  Department: [facility]  Number of Participants: 1  Group Focus: acceptance, art therapy, chemical dependency issues, and coping skills Treatment Modality:  Cognitive Behavioral Therapy and Solution-Focused Therapy Interventions utilized were exploration and patient education Purpose: enhance coping skills, express feelings, improve communication skills, and trigger / craving management   The activity explored Build coping skills, self-esteem, relapse-prevention strategies.  The activity was called [employer].  The activity explored Personal strengths, People who support you, Healthy coping tools, Values & motivations in recovery.   The Writer explored which section is the hardest,  How can these strengths show up in moments of craving? & Which values matter most in your sobriety.  Writer processed each one with the client and gave positive supportive feedback.  Name: John [name] Date of Birth: [birth year]  MR: [id number]    Level of Participation: Client did not attend group.  He declined.  Client was encouraged.  Quality of Participation: NA Interactions with others: NA Mood/Affect: NA Triggers (if applicable): NA Cognition: NA Progress: NA Response: NA Plan:  Client will be encouraged to attend group.  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

## 2024-02-10 NOTE — Care Management (Signed)
 Interdisciplinary Treatment and Diagnostic Plan Update  02/10/2024 Time of Session: 2pm CARMAN ESSICK MRN: [id number]  Diagnosis:  Final diagnoses:  None     Current Medications:  Current Facility-Administered Medications  Medication Dose Route Frequency Provider Last Rate Last Admin   acetaminophen  (TYLENOL ) tablet 650 mg  650 mg Oral Q6H PRN Motley-Mangrum, Jadeka A, PMHNP       alum & mag hydroxide-simeth (MAALOX/MYLANTA) 200-200-20 MG/5ML suspension 30 mL  30 mL Oral Q4H PRN Motley-Mangrum, Jadeka A, PMHNP       chlordiazePOXIDE  (LIBRIUM ) capsule 25 mg  25 mg Oral Q6H PRN Motley-Mangrum, Jadeka A, PMHNP       haloperidol  (HALDOL ) tablet 5 mg  5 mg Oral TID PRN Motley-Mangrum, Jadeka A, PMHNP       And   diphenhydrAMINE  (BENADRYL ) capsule 50 mg  50 mg Oral TID PRN Motley-Mangrum, Jadeka A, PMHNP       haloperidol  lactate (HALDOL ) injection 5 mg  5 mg Intramuscular TID PRN Motley-Mangrum, Jadeka A, PMHNP       And   diphenhydrAMINE  (BENADRYL ) injection 50 mg  50 mg Intramuscular TID PRN Motley-Mangrum, Jadeka A, PMHNP       And   LORazepam  (ATIVAN ) injection 2 mg  2 mg Intramuscular TID PRN Motley-Mangrum, Jadeka A, PMHNP       haloperidol  lactate (HALDOL ) injection 10 mg  10 mg Intramuscular TID PRN Motley-Mangrum, Jadeka A, PMHNP       And   diphenhydrAMINE  (BENADRYL ) injection 50 mg  50 mg Intramuscular TID PRN Motley-Mangrum, Jadeka A, PMHNP       And   LORazepam  (ATIVAN ) injection 2 mg  2 mg Intramuscular TID PRN Motley-Mangrum, Jadeka A, PMHNP       doxycycline  (VIBRA -TABS) tablet 100 mg  100 mg Oral Q12H Motley-Mangrum, Jadeka A, PMHNP   100 mg at 02/10/24 0914   escitalopram  (LEXAPRO ) tablet 5 mg  5 mg Oral Daily Motley-Mangrum, Jadeka A, PMHNP   5 mg at 02/10/24 0914   gabapentin  (NEURONTIN ) capsule 300 mg  300 mg Oral Q8H Motley-Mangrum, Jadeka A, PMHNP   300 mg at 02/10/24 0914   hydrOXYzine  (ATARAX ) tablet 25 mg  25 mg Oral Q6H PRN Motley-Mangrum, Jadeka A, PMHNP   25  mg at 02/09/24 2154   hydrOXYzine  (ATARAX ) tablet 50 mg  50 mg Oral TID PRN Motley-Mangrum, Jadeka A, PMHNP       magnesium  hydroxide (MILK OF MAGNESIA) suspension 30 mL  30 mL Oral Daily PRN Motley-Mangrum, Jadeka A, PMHNP       nicotine  (NICODERM CQ  - dosed in mg/24 hours) patch 21 mg  21 mg Transdermal Daily Motley-Mangrum, Jadeka A, PMHNP   21 mg at 02/10/24 9085   traZODone  (DESYREL ) tablet 100 mg  100 mg Oral QHS PRN Motley-Mangrum, Jadeka A, PMHNP   100 mg at 02/09/24 2155   Current Outpatient Medications  Medication Sig Dispense Refill   albuterol  (VENTOLIN  HFA) 108 (90 Base) MCG/ACT inhaler Inhale 2 puffs into the lungs every 6 (six) hours as needed for wheezing or shortness of breath. 8 g 2   cephALEXin  (KEFLEX ) 500 MG capsule Take 1 capsule (500 mg total) by mouth 4 (four) times daily. (Patient not taking: Reported on 02/09/2024) 28 capsule 0   escitalopram  (LEXAPRO ) 5 MG tablet ESCITALOPRAM  OXALATE 5 MG TABS (Patient not taking: Reported on 02/09/2024)     gabapentin  (NEURONTIN ) 600 MG tablet Take 600 mg by mouth 3 (three) times daily.     hydrOXYzine  (ATARAX )  50 MG tablet Take 50 mg by mouth daily as needed for anxiety.     ibuprofen  (ADVIL ) 400 MG tablet Take 400 mg by mouth every 6 (six) hours as needed. (Patient not taking: Reported on 02/09/2024)     naltrexone  (DEPADE) 50 MG tablet Take 1 tablet (50 mg total) by mouth daily. (Patient not taking: Reported on 02/09/2024) 30 tablet 0   nicotine  (NICODERM CQ  - DOSED IN MG/24 HOURS) 14 mg/24hr patch Place 1 patch (14 mg total) onto the skin daily.     thiamine  (VITAMIN B-1) 100 MG tablet Take 1 tablet (100 mg total) by mouth daily. (Patient not taking: Reported on 02/09/2024) 14 tablet 0   traZODone  (DESYREL ) 100 MG tablet Take 1 tablet (100 mg total) by mouth at bedtime as needed for sleep. (Patient not taking: Reported on 02/09/2024) 14 tablet 0   PTA Medications: Prior to Admission medications  Medication Sig Start Date End Date  Taking? Authorizing Provider  albuterol  (VENTOLIN  HFA) 108 (90 Base) MCG/ACT inhaler Inhale 2 puffs into the lungs every 6 (six) hours as needed for wheezing or shortness of breath. 11/12/23   Nicholaus Rolland BRAVO, MD  cephALEXin  (KEFLEX ) 500 MG capsule Take 1 capsule (500 mg total) by mouth 4 (four) times daily. Patient not taking: Reported on 02/09/2024 02/08/24   Gollan, Timothy J, MD  escitalopram  (LEXAPRO ) 5 MG tablet ESCITALOPRAM  OXALATE 5 MG TABS Patient not taking: Reported on 02/09/2024 09/21/23   [provider]  gabapentin  (NEURONTIN ) 600 MG tablet Take 600 mg by mouth 3 (three) times daily.    [provider]  hydrOXYzine  (ATARAX ) 50 MG tablet Take 50 mg by mouth daily as needed for anxiety. 01/06/24   [provider]  ibuprofen  (ADVIL ) 400 MG tablet Take 400 mg by mouth every 6 (six) hours as needed. Patient not taking: Reported on 02/09/2024 11/19/23   [provider]  naltrexone  (DEPADE) 50 MG tablet Take 1 tablet (50 mg total) by mouth daily. Patient not taking: Reported on 02/09/2024 11/10/23   Blair Robin H, NP  nicotine  (NICODERM CQ  - DOSED IN MG/24 HOURS) 14 mg/24hr patch Place 1 patch (14 mg total) onto the skin daily. 11/11/23   Bennett, Christal H, NP  thiamine  (VITAMIN B-1) 100 MG tablet Take 1 tablet (100 mg total) by mouth daily. Patient not taking: Reported on 02/09/2024 11/11/23   Blair Robin H, NP  traZODone  (DESYREL ) 100 MG tablet Take 1 tablet (100 mg total) by mouth at bedtime as needed for sleep. Patient not taking: Reported on 02/09/2024 11/10/23   Blair Robin DEL, NP    Patient Stressors: Financial difficulties   Substance abuse   Other: Homeless    Patient Strengths: Capable of independent living  Communication skills   Treatment Modalities: Medication Management, Group therapy, Case management,  1 to 1 session with clinician, Psychoeducation, Recreational therapy.   Physician Treatment Plan for Primary and  Secondary Diagnosis:  Final diagnoses:  None   Long Term Goal(s):    Short Term Goals:    Medication Management: Evaluate patient's response, side effects, and tolerance of medication regimen.  Therapeutic Interventions: 1 to 1 sessions, Unit Group sessions and Medication administration.  Evaluation of Outcomes: Progressing  LCSW Treatment Plan for Primary Diagnosis:  Final diagnoses:  None    Long Term Goal(s): Safe transition to appropriate next level of care at discharge.  Short Term Goals: Facilitate acceptance of mental health diagnosis and concerns through verbal commitment to aftercare plan and appointments  at discharge.  Therapeutic Interventions: Assess for all discharge needs, 1 to 1 time with Child psychotherapist, Explore available resources and support systems, Assess for adequacy in community support network, Educate family and significant other(s) on suicide prevention, Complete Psychosocial Assessment, Interpersonal group therapy.  Evaluation of Outcomes: Progressing   Progress in Treatment: Attending groups: No. Participating in groups: No. Taking medication as prescribed: Yes. Toleration medication: As evidenced by:  Client self report.   Family/Significant other contact made: No, will contact:  Client reports no contact Patient understands diagnosis: Yes. Discussing patient identified problems/goals with staff: Yes. Medical problems stabilized or resolved: Yes. Denies suicidal/homicidal ideation: Yes. Issues/concerns per patient self-inventory: Yes. Other: Client reports being Homeless.  Client reports strained relationship with peers and family.  Client reports no after service once he completes his detox treatment. Client is on PRN for taper.   New problem(s) identified: No, Describe:  n/a  New Short Term/Long Term Goal(s):Client reports his goal is to detox.  Client reports no desire for outpatient and or residential treatment.   Patient Goals: Client will  complete detox and discharge on 02/11/24.  Client reports he will discharge back to [facility].   Discharge Plan or Barriers:  Client  and Providers reports no barriers to discharge to [facility].    Reason for Continuation of Hospitalization: Anxiety Medication stabilization.  Client will discharge tomorrow.  Client is currently presenting with no major concerns and or questions at this time.    Estimated Length of Stay:02/09/24 - 02/11/24  Last 3 [place] Suicide Severity Risk Score: Flowsheet Row ED from 02/09/2024 in [facility] Most recent reading at 02/09/2024  4:26 PM ED from 02/09/2024 in [facility] Emergency Department at [facility] Most recent reading at 02/09/2024  8:16 AM ED from 11/20/2023 in [facility] Emergency Department at [facility] Most recent reading at 11/21/2023 11:58 AM  C-SSRS RISK CATEGORY High Risk No Risk Error: Q3, 4, or 5 should not be populated when Q2 is No    Last PHQ 2/9 Scores:    08/09/2023   10:05 AM 08/06/2023   11:20 AM 07/19/2019    3:18 PM  Depression screen PHQ 2/9  Decreased Interest 1 1 1   Down, Depressed, Hopeless 1 1 3   PHQ - 2 Score 2 2 4   Altered sleeping 1 1 3   Tired, decreased energy 1 1 0  Change in appetite 1 1 1   Feeling bad or failure about yourself  1 1 1   Trouble concentrating 1 1 1   Moving slowly or fidgety/restless 0 0 0  Suicidal thoughts 0 1 0  PHQ-9 Score 7  8  10    Difficult doing work/chores   Somewhat difficult     Data saved with a previous flowsheet row definition    Scribe for Treatment Team: Laquetta Racey, LCSW 02/10/2024 3:10 PM

## 2024-02-10 NOTE — Group Note (Signed)
 Group Topic: Social Support  Group Date: 02/10/2024 Start Time: 1600 End Time: 1630 Facilitators: Kendrea Cerritos, Zane HERO, RN; Herold Lajuana NOVAK, RN  Department: [facility]  Number of Participants: 1  Group Focus: check in and nursing group Treatment Modality:  Individual Therapy Interventions utilized were patient education Purpose: increase insight  Name: John [name] Date of Birth: [birth year]  MR: [id number]    Level of Participation: moderate Quality of Participation: attentive and cooperative Interactions with others: gave feedback Mood/Affect: appropriate Triggers (if applicable): None identified at this time Cognition: coherent/clear and logical Progress: Gaining insight Response: Patient voices understanding of medication treatment, no concerns voiced at this time, voices understanding of who to speak with in case future questions arise Plan: patient will be encouraged to continue to attend groups/programming on the unit  Patients Problems:  Patient Active Problem List   Diagnosis Date Noted   Chest pain of uncertain etiology 02/05/2024   Alcohol use disorder, severe, dependence (HCC) 11/06/2023   Alcohol withdrawal (HCC) 08/23/2023   Tachycardia 08/23/2023   Alcohol dependence with uncomplicated withdrawal (HCC) 08/23/2023   MDD (major depressive disorder) 08/05/2023   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

## 2024-02-10 NOTE — Care Management (Signed)
 [facility] Care Management.Education Officer, Museum met with patient to discuss discharge planning...  Patient [employer] Tailored Plan  Patient reported previous meeting with Harlene and Bri (PSS) at hospital  Patient reported that were going to get him set up with housing.  Patient reported that he is homeless and that he had a slip up pertaining to his recent alcohol consumption  Patient decline inpatient treatment and not sure about IOP.  Writer advised patient of length of stay 3-5 days and possible discharge   Writer will follow up with PSS

## 2024-02-10 NOTE — ED Notes (Signed)
 Pt sitting in dayroom watching television and interacting with peers. No acute distress noted. No concerns voiced. Informed pt to notify staff with any needs or assistance. Pt verbalized understanding and agreement. Will continue to monitor for safety.

## 2024-02-11 DIAGNOSIS — F32A Depression, unspecified: Secondary | ICD-10-CM | POA: Diagnosis not present

## 2024-02-11 DIAGNOSIS — F1014 Alcohol abuse with alcohol-induced mood disorder: Secondary | ICD-10-CM | POA: Diagnosis not present

## 2024-02-11 DIAGNOSIS — F845 Asperger's syndrome: Secondary | ICD-10-CM | POA: Diagnosis not present

## 2024-02-11 DIAGNOSIS — F411 Generalized anxiety disorder: Secondary | ICD-10-CM | POA: Diagnosis not present

## 2024-02-11 NOTE — ED Notes (Signed)
 Patient is in the bedroom sleeping, NAD Environment secured per policy. Will continue to monitor for safety

## 2024-02-11 NOTE — Group Note (Signed)
 Group Topic: Recovery Basics  Group Date: 02/11/2024 Start Time: 2000 End Time: 2030 Facilitators: Verdon Jacqualyn BRAVO, NT  Department: [facility]  Number of Participants: 7  Group Focus: coping skills Treatment Modality:  Patient-Centered Therapy Interventions utilized were group exercise Purpose: relapse prevention strategies  Name: ABDULKARIM EBERLIN Date of Birth: 12-09-1980  MR: [id number]    Level of Participation: active Quality of Participation: cooperative Interactions with others: gave feedback Mood/Affect: appropriate Triggers (if applicable): n/a Cognition: coherent/clear Progress: Moderate Response: n/a Plan: follow-up needed  Patients Problems:  Patient Active Problem List   Diagnosis Date Noted   Chest pain of uncertain etiology 02/05/2024   Alcohol use disorder, severe, dependence (HCC) 11/06/2023   Alcohol withdrawal (HCC) 08/23/2023   Tachycardia 08/23/2023   Alcohol dependence with uncomplicated withdrawal (HCC) 08/23/2023   MDD (major depressive disorder) 08/05/2023   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

## 2024-02-11 NOTE — Progress Notes (Signed)
 Patient is resting at this time with no acute distress noted.  Respirations present, even and unlabored.  No issues noted. Will continue Q 15 min safety checks for safety/behavior per facility protocol.

## 2024-02-11 NOTE — ED Notes (Signed)
 Patient is in the dayroom calm and composed having snacks with other patients NAD, Respirations even and unlabored.. Environment secured per policy. Will monitor for safety.

## 2024-02-11 NOTE — Group Note (Signed)
 Group Topic: Wellness  Group Date: 02/11/2024 Start Time: 1130 End Time: 1200 Facilitators: Belle Camellia SAILOR, RN  Department: [facility]  Number of Participants: 8  Group Focus: other Nutrition and recovery Treatment Modality:  Psychoeducation Interventions utilized were patient education Purpose: increase insight and trigger / craving management  Name: John [name] Date of Birth: [birth year]  MR: [id number]    Level of Participation: Pt did not participate  Patients Problems:  Patient Active Problem List   Diagnosis Date Noted   Chest pain of uncertain etiology 02/05/2024   Alcohol use disorder, severe, dependence (HCC) 11/06/2023   Alcohol withdrawal (HCC) 08/23/2023   Tachycardia 08/23/2023   Alcohol dependence with uncomplicated withdrawal (HCC) 08/23/2023   MDD (major depressive disorder) 08/05/2023   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

## 2024-02-11 NOTE — Group Note (Signed)
 Group Topic: Recovery Basics  Group Date: 02/11/2024 Start Time: 1525 End Time: 1545 Facilitators: Marikay Briant SAILOR, RN  Department: [facility]  Number of Participants: 5  Group Focus: nursing group Treatment Modality:  Solution-Focused Therapy Interventions utilized were assignment Purpose: relapse prevention strategies  Patients were given a short autobiography by Garrie Moose, related to addiction and recovery. Patients were asked to read the autobiography and consider how her journey is similar or differs from their recovery journey. After giving the autobiography some thoughts, the patients were asked to answer the related questions. The take away from this activity  Your life doesn't get better by chance, it gets better by CHANGE. Name: John [name] Date of Birth: Jun 13, 1980  MR: [id number]    Level of Participation: active Quality of Participation: cooperative Interactions with others: gave feedback Mood/Affect: appropriate Triggers (if applicable): none Cognition: coherent/clear Progress: Moderate Response: receptive Plan: referral / recommendations  Patients Problems:  Patient Active Problem List   Diagnosis Date Noted   Chest pain of uncertain etiology 02/05/2024   Alcohol use disorder, severe, dependence (HCC) 11/06/2023   Alcohol withdrawal (HCC) 08/23/2023   Tachycardia 08/23/2023   Alcohol dependence with uncomplicated withdrawal (HCC) 08/23/2023   MDD (major depressive disorder) 08/05/2023   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 Hx urethroplasty for urethral trauma in 2020 04/30/2019   Alcohol abuse with intoxication 04/30/2019   Acute Femoral condyle fracture (HCC) 04/30/2019    Alcohol abuse 01/14/2018   Substance induced mood disorder (HCC) 01/14/2018

## 2024-02-11 NOTE — ED Provider Notes (Cosign Needed)
 Behavioral Health Progress Note  Date and Time: 02/11/2024 3:53 PM Name: John [name] MRN:  [id number]  Subjective:  John [name], is seen face to face by this provider, consulted with Dr. Cole; and chart reviewed on 02/11/2024.  John [name] was laying in bed with an irritable mood. He said he wanted to sleep. Provider made him aware that everyone needs to be assessed daily by the provider. Afterwards, the patient was more receptive to the assessment. When asked about the last time he had alcohol, he reported he does not know. He is denying withdrawal symptoms at this time. He reports being medication-adherent. He reports his sleep was okay, and his appetite is good. He is denying suicidal or homicidal ideations at this time. He denies auditory or visual hallucinations.  When asked what his plan is, he said, You can send me out whenever you guys are ready.  Diagnosis:  Final diagnoses:  Alcohol-induced mood disorder with depressive symptoms (HCC)  GAD (generalized anxiety disorder)    Total Time spent with patient: 15 minutes  Past Psychiatric History: Suicidal ideation, depression, alcohol induced mood d/o, bipolar 1 d/o, malingering.  Past Medical History: Orthopedic hardware present.  Family History: Non-reported.  Family Psychiatric  History: Non-reported.  Social History: Pt is experiencing homelessness   Additional Social History:                         Sleep: Good  Appetite:  Good  Current Medications:  Current Facility-Administered Medications  Medication Dose Route Frequency Provider Last Rate Last Admin   acetaminophen  (TYLENOL ) tablet 650 mg  650 mg Oral Q6H PRN Motley-Mangrum, Jadeka A, PMHNP   650 mg at 02/10/24 1545   albuterol  (VENTOLIN  HFA) 108 (90 Base) MCG/ACT inhaler 1-2 puff  1-2 puff Inhalation Q6H PRN Nkwenti, Doris, NP       alum & mag hydroxide-simeth (MAALOX/MYLANTA) 200-200-20 MG/5ML suspension 30 mL  30 mL Oral Q4H PRN  Motley-Mangrum, Jadeka A, PMHNP       chlordiazePOXIDE  (LIBRIUM ) capsule 25 mg  25 mg Oral Q6H PRN Motley-Mangrum, Jadeka A, PMHNP       haloperidol  (HALDOL ) tablet 5 mg  5 mg Oral TID PRN Motley-Mangrum, Jadeka A, PMHNP       And   diphenhydrAMINE  (BENADRYL ) capsule 50 mg  50 mg Oral TID PRN Motley-Mangrum, Jadeka A, PMHNP       haloperidol  lactate (HALDOL ) injection 5 mg  5 mg Intramuscular TID PRN Motley-Mangrum, Jadeka A, PMHNP       And   diphenhydrAMINE  (BENADRYL ) injection 50 mg  50 mg Intramuscular TID PRN Motley-Mangrum, Jadeka A, PMHNP       And   LORazepam  (ATIVAN ) injection 2 mg  2 mg Intramuscular TID PRN Motley-Mangrum, Jadeka A, PMHNP       haloperidol  lactate (HALDOL ) injection 10 mg  10 mg Intramuscular TID PRN Motley-Mangrum, Jadeka A, PMHNP       And   diphenhydrAMINE  (BENADRYL ) injection 50 mg  50 mg Intramuscular TID PRN Motley-Mangrum, Jadeka A, PMHNP       And   LORazepam  (ATIVAN ) injection 2 mg  2 mg Intramuscular TID PRN Motley-Mangrum, Jadeka A, PMHNP       doxycycline  (VIBRA -TABS) tablet 100 mg  100 mg Oral Q12H Motley-Mangrum, Jadeka A, PMHNP   100 mg at 02/11/24 0954   escitalopram  (LEXAPRO ) tablet 5 mg  5 mg Oral Daily Motley-Mangrum, Jadeka A, PMHNP   5 mg at  02/11/24 0954   gabapentin  (NEURONTIN ) capsule 300 mg  300 mg Oral Q8H Motley-Mangrum, Jadeka A, PMHNP   300 mg at 02/11/24 1352   hydrOXYzine  (ATARAX ) tablet 25 mg  25 mg Oral Q6H PRN Motley-Mangrum, Jadeka A, PMHNP   25 mg at 02/09/24 2154   hydrOXYzine  (ATARAX ) tablet 50 mg  50 mg Oral TID PRN Motley-Mangrum, Jadeka A, PMHNP   50 mg at 02/11/24 1352   loperamide  (IMODIUM ) capsule 2-4 mg  2-4 mg Oral PRN Tex Drilling, NP       magnesium  hydroxide (MILK OF MAGNESIA) suspension 30 mL  30 mL Oral Daily PRN Motley-Mangrum, Jadeka A, PMHNP       nicotine  (NICODERM CQ  - dosed in mg/24 hours) patch 21 mg  21 mg Transdermal Daily Motley-Mangrum, Jadeka A, PMHNP   21 mg at 02/11/24 0957   ondansetron  (ZOFRAN -ODT)  disintegrating tablet 4 mg  4 mg Oral Q6H PRN Tex Drilling, NP       thiamine  (VITAMIN B1) tablet 100 mg  100 mg Oral Daily Nkwenti, Doris, NP   100 mg at 02/11/24 9046   traZODone  (DESYREL ) tablet 100 mg  100 mg Oral QHS PRN Motley-Mangrum, Jadeka A, PMHNP   100 mg at 02/10/24 2112   Current Outpatient Medications  Medication Sig Dispense Refill   albuterol  (VENTOLIN  HFA) 108 (90 Base) MCG/ACT inhaler Inhale 2 puffs into the lungs every 6 (six) hours as needed for wheezing or shortness of breath. 8 g 2   cephALEXin  (KEFLEX ) 500 MG capsule Take 1 capsule (500 mg total) by mouth 4 (four) times daily. (Patient not taking: Reported on 02/09/2024) 28 capsule 0   escitalopram  (LEXAPRO ) 5 MG tablet ESCITALOPRAM  OXALATE 5 MG TABS (Patient not taking: Reported on 02/09/2024)     gabapentin  (NEURONTIN ) 600 MG tablet Take 600 mg by mouth 3 (three) times daily.     hydrOXYzine  (ATARAX ) 50 MG tablet Take 50 mg by mouth daily as needed for anxiety.     ibuprofen  (ADVIL ) 400 MG tablet Take 400 mg by mouth every 6 (six) hours as needed. (Patient not taking: Reported on 02/09/2024)     naltrexone  (DEPADE) 50 MG tablet Take 1 tablet (50 mg total) by mouth daily. (Patient not taking: Reported on 02/09/2024) 30 tablet 0   nicotine  (NICODERM CQ  - DOSED IN MG/24 HOURS) 14 mg/24hr patch Place 1 patch (14 mg total) onto the skin daily.     thiamine  (VITAMIN B-1) 100 MG tablet Take 1 tablet (100 mg total) by mouth daily. (Patient not taking: Reported on 02/09/2024) 14 tablet 0   traZODone  (DESYREL ) 100 MG tablet Take 1 tablet (100 mg total) by mouth at bedtime as needed for sleep. (Patient not taking: Reported on 02/09/2024) 14 tablet 0    Labs  Lab Results:  Admission on 02/09/2024, Discharged on 02/09/2024  Component Date Value Ref Range Status   Sodium 02/09/2024 140  135 - 145 mmol/L Final   Potassium 02/09/2024 3.8  3.5 - 5.1 mmol/L Final   Chloride 02/09/2024 99  98 - 111 mmol/L Final   CO2 02/09/2024 24   22 - 32 mmol/L Final   Glucose, Bld 02/09/2024 90  70 - 99 mg/dL Final   Glucose reference range applies only to samples taken after fasting for at least 8 hours.   BUN 02/09/2024 5 (L)  6 - 20 mg/dL Final   Creatinine, Ser 02/09/2024 0.72  0.61 - 1.24 mg/dL Final   Calcium  02/09/2024 9.9  8.9 - 10.3  mg/dL Final   Total Protein 87/83/7974 8.7 (H)  6.5 - 8.1 g/dL Final   Albumin 87/83/7974 4.8  3.5 - 5.0 g/dL Final   AST 87/83/7974 25  15 - 41 U/L Final   ALT 02/09/2024 9  0 - 44 U/L Final   Alkaline Phosphatase 02/09/2024 85  38 - 126 U/L Final   Total Bilirubin 02/09/2024 0.3  0.0 - 1.2 mg/dL Final   GFR, Estimated 02/09/2024 >60  >60 mL/min Final   Comment: (NOTE) Calculated using the CKD-EPI Creatinine Equation (2021)    Anion gap 02/09/2024 16 (H)  5 - 15 Final   Performed at [facility], [address] [place], [place] [address]   Alcohol, Ethyl (B) 02/09/2024 87 (H)  <15 mg/dL Final   Comment: (NOTE) For medical purposes only. Performed at [facility], [address], [place], [place] [address]    WBC 02/09/2024 13.1 (H)  4.0 - 10.5 K/uL Final   RBC 02/09/2024 5.35  4.22 - 5.81 MIL/uL Final   Hemoglobin 02/09/2024 16.1  13.0 - 17.0 g/dL Final   HCT 87/83/7974 48.6  39.0 - 52.0 % Final   MCV 02/09/2024 90.8  80.0 - 100.0 fL Final   MCH 02/09/2024 30.1  26.0 - 34.0 pg Final   MCHC 02/09/2024 33.1  30.0 - 36.0 g/dL Final   RDW 87/83/7974 12.9  11.5 - 15.5 % Final   Platelets 02/09/2024 321  150 - 400 K/uL Final   nRBC 02/09/2024 0.0  0.0 - 0.2 % Final   Performed at [facility], [address] [place], [place] [address]   Opiates 02/09/2024 NEGATIVE  NEGATIVE Final   Cocaine 02/09/2024 NEGATIVE  NEGATIVE Final   Benzodiazepines 02/09/2024 NEGATIVE  NEGATIVE Final   Amphetamines 02/09/2024 NEGATIVE  NEGATIVE Final   Tetrahydrocannabinol 02/09/2024 POSITIVE (A)  NEGATIVE Final   Barbiturates 02/09/2024 NEGATIVE   NEGATIVE Final   Methadone Scn, Ur 02/09/2024 NEGATIVE  NEGATIVE Final   Fentanyl  02/09/2024 NEGATIVE  NEGATIVE Final   Comment: (NOTE) Drug screen is for Medical Purposes only. Positive results are preliminary only. If confirmation is needed, notify lab within 5 days.  Drug Class                 Cutoff (ng/mL) Amphetamine and metabolites 1000 Barbiturate and metabolites 200 Benzodiazepine              200 Opiates and metabolites     300 Cocaine and metabolites     300 THC                         50 Fentanyl                     5 Methadone                   300  Trazodone  is metabolized in vivo to several metabolites,  including pharmacologically active m-CPP, which is excreted in the  urine.  Immunoassay screens for amphetamines and MDMA have potential  cross-reactivity with these compounds and may provide false positive  result.  Performed at [facility], [address] [place], [place] [address]   Clinical Support on 01/06/2024  Component Date Value Ref Range Status   Hepatitis B Surface Ag 01/06/2024 Non Reactive   Final   Hepatitis B Surface Ag 01/06/2024 Non Reactive   Final   Hep B Core Total  Ab 01/06/2024 Non Reactive   Final  Admission on 11/20/2023, Discharged on 11/21/2023  Component Date Value Ref Range Status   Sodium 11/20/2023 139  135 - 145 mmol/L Final   Potassium 11/20/2023 3.4 (L)  3.5 - 5.1 mmol/L Final   Chloride 11/20/2023 105  98 - 111 mmol/L Final   CO2 11/20/2023 22  22 - 32 mmol/L Final   Glucose, Bld 11/20/2023 99  70 - 99 mg/dL Final   Glucose reference range applies only to samples taken after fasting for at least 8 hours.   BUN 11/20/2023 6  6 - 20 mg/dL Final   Creatinine, Ser 11/20/2023 0.65  0.61 - 1.24 mg/dL Final   Calcium  11/20/2023 8.5 (L)  8.9 - 10.3 mg/dL Final   Total Protein 90/73/7974 8.0  6.5 - 8.1 g/dL Final   Albumin 90/73/7974 3.7  3.5 - 5.0 g/dL Final   AST 90/73/7974 24  15 - 41 U/L Final   ALT  11/20/2023 20  0 - 44 U/L Final   Alkaline Phosphatase 11/20/2023 55  38 - 126 U/L Final   Total Bilirubin 11/20/2023 0.4  0.0 - 1.2 mg/dL Final   GFR, Estimated 11/20/2023 >60  >60 mL/min Final   Comment: (NOTE) Calculated using the CKD-EPI Creatinine Equation (2021)    Anion gap 11/20/2023 12  5 - 15 Final   Performed at [facility], [address] [place], [place] [address]   Alcohol, Ethyl (B) 11/20/2023 159 (H)  <15 mg/dL Final   Comment: (NOTE) For medical purposes only. Performed at [facility], [address] [place], [place] [address]    WBC 11/20/2023 7.9  4.0 - 10.5 K/uL Final   RBC 11/20/2023 4.68  4.22 - 5.81 MIL/uL Final   Hemoglobin 11/20/2023 13.9  13.0 - 17.0 g/dL Final   HCT 90/73/7974 42.5  39.0 - 52.0 % Final   MCV 11/20/2023 90.8  80.0 - 100.0 fL Final   MCH 11/20/2023 29.7  26.0 - 34.0 pg Final   MCHC 11/20/2023 32.7  30.0 - 36.0 g/dL Final   RDW 90/73/7974 15.1  11.5 - 15.5 % Final   Platelets 11/20/2023 284  150 - 400 K/uL Final   nRBC 11/20/2023 0.0  0.0 - 0.2 % Final   Neutrophils Relative % 11/20/2023 57  % Final   Neutro Abs 11/20/2023 4.6  1.7 - 7.7 K/uL Final   Lymphocytes Relative 11/20/2023 34  % Final   Lymphs Abs 11/20/2023 2.7  0.7 - 4.0 K/uL Final   Monocytes Relative 11/20/2023 7  % Final   Monocytes Absolute 11/20/2023 0.5  0.1 - 1.0 K/uL Final   Eosinophils Relative 11/20/2023 1  % Final   Eosinophils Absolute 11/20/2023 0.1  0.0 - 0.5 K/uL Final   Basophils Relative 11/20/2023 1  % Final   Basophils Absolute 11/20/2023 0.1  0.0 - 0.1 K/uL Final   Immature Granulocytes 11/20/2023 0  % Final   Abs Immature Granulocytes 11/20/2023 0.03  0.00 - 0.07 K/uL Final   Performed at [facility], [address] [place], [place] [address]  Admission on 11/12/2023, Discharged on 11/12/2023  Component Date Value Ref Range Status   WBC 11/12/2023 8.7  4.0 - 10.5 K/uL Final   RBC 11/12/2023 4.33  4.22 - 5.81 MIL/uL  Final   Hemoglobin 11/12/2023 13.4  13.0 - 17.0 g/dL Final   HCT 90/81/7974 40.4  39.0 - 52.0 % Final   MCV 11/12/2023 93.3  80.0 - 100.0  fL Final   MCH 11/12/2023 30.9  26.0 - 34.0 pg Final   MCHC 11/12/2023 33.2  30.0 - 36.0 g/dL Final   RDW 90/81/7974 14.8  11.5 - 15.5 % Final   Platelets 11/12/2023 389  150 - 400 K/uL Final   nRBC 11/12/2023 0.0  0.0 - 0.2 % Final   Neutrophils Relative % 11/12/2023 55  % Final   Neutro Abs 11/12/2023 5.0  1.7 - 7.7 K/uL Final   Lymphocytes Relative 11/12/2023 33  % Final   Lymphs Abs 11/12/2023 2.8  0.7 - 4.0 K/uL Final   Monocytes Relative 11/12/2023 9  % Final   Monocytes Absolute 11/12/2023 0.7  0.1 - 1.0 K/uL Final   Eosinophils Relative 11/12/2023 1  % Final   Eosinophils Absolute 11/12/2023 0.1  0.0 - 0.5 K/uL Final   Basophils Relative 11/12/2023 1  % Final   Basophils Absolute 11/12/2023 0.1  0.0 - 0.1 K/uL Final   WBC Morphology 11/12/2023 See Note   Final   Mild Left Shift (1-5% metas, occ myelo)   RBC Morphology 11/12/2023 See Note   Final   MIXED RBC POPULATION   Smear Review 11/12/2023 Normal platelet morphology   Final   Immature Granulocytes 11/12/2023 1  % Final   Abs Immature Granulocytes 11/12/2023 0.06  0.00 - 0.07 K/uL Final   Performed at [facility], [address] [place], [place] [address]   Sodium 11/12/2023 140  135 - 145 mmol/L Final   Potassium 11/12/2023 3.3 (L)  3.5 - 5.1 mmol/L Final   Chloride 11/12/2023 107  98 - 111 mmol/L Final   CO2 11/12/2023 24  22 - 32 mmol/L Final   Glucose, Bld 11/12/2023 98  70 - 99 mg/dL Final   Glucose reference range applies only to samples taken after fasting for at least 8 hours.   BUN 11/12/2023 9  6 - 20 mg/dL Final   Creatinine, Ser 11/12/2023 0.70  0.61 - 1.24 mg/dL Final   Calcium  11/12/2023 8.2 (L)  8.9 - 10.3 mg/dL Final   GFR, Estimated 11/12/2023 >60  >60 mL/min Final   Comment: (NOTE) Calculated using the CKD-EPI Creatinine Equation (2021)    Anion gap  11/12/2023 9  5 - 15 Final   Performed at [facility], [address] [place], [place] [address]   Alcohol, Ethyl (B) 11/12/2023 140 (H)  <15 mg/dL Final   Comment: (NOTE) For medical purposes only. Performed at [facility], [address] [place], [place] [address]    Acetaminophen  (Tylenol ), Serum 11/12/2023 <10 (L)  10 - 30 ug/mL Final   Comment: (NOTE) Therapeutic concentrations vary significantly. A range of 10-30 ug/mL  may be an effective concentration for many patients. However, some  are best treated at concentrations outside of this range. Acetaminophen  concentrations >150 ug/mL at 4 hours after ingestion  and >50 ug/mL at 12 hours after ingestion are often associated with  toxic reactions.  Performed at [facility], [address] [place], [place] [address]    Salicylate Lvl 11/12/2023 <7.0 (L)  7.0 - 30.0 mg/dL Final   Performed at [facility], [address] [place], [place] [address]   Troponin I (High Sensitivity) 11/12/2023 4  <18 ng/L Final   Comment: (NOTE) Elevated high sensitivity troponin I (hsTnI) values and significant  changes across serial measurements may suggest ACS but many other  chronic and acute conditions are known to elevate hsTnI results.  Refer to the Links  section for chest pain algorithms and additional  guidance. Performed at [facility], [address] [place], [place] [address]    Tricyclic, Ur Screen 11/12/2023 NONE DETECTED  NONE DETECTED Final   Amphetamines, Ur Screen 11/12/2023 NONE DETECTED  NONE DETECTED Final   MDMA (Ecstasy)Ur Screen 11/12/2023 NONE DETECTED  NONE DETECTED Final   Cocaine Metabolite,Ur [place] 11/12/2023 NONE DETECTED  NONE DETECTED Final   Opiate, Ur Screen 11/12/2023 NONE DETECTED  NONE DETECTED Final   Phencyclidine (PCP) Ur S 11/12/2023 NONE DETECTED  NONE DETECTED Final   Cannabinoid 50 Ng, Ur [place] 11/12/2023 POSITIVE (A)  NONE DETECTED Final    Barbiturates, Ur Screen 11/12/2023 NONE DETECTED  NONE DETECTED Final   Benzodiazepine, Ur Scrn 11/12/2023 NONE DETECTED  NONE DETECTED Final   Methadone Scn, Ur 11/12/2023 NONE DETECTED  NONE DETECTED Final   Comment: (NOTE) Tricyclics + metabolites, urine    Cutoff 1000 ng/mL Amphetamines + metabolites, urine  Cutoff 1000 ng/mL MDMA (Ecstasy), urine              Cutoff 500 ng/mL Cocaine Metabolite, urine          Cutoff 300 ng/mL Opiate + metabolites, urine        Cutoff 300 ng/mL Phencyclidine (PCP), urine         Cutoff 25 ng/mL Cannabinoid, urine                 Cutoff 50 ng/mL Barbiturates + metabolites, urine  Cutoff 200 ng/mL Benzodiazepine, urine              Cutoff 200 ng/mL Methadone, urine                   Cutoff 300 ng/mL  The urine drug screen provides only a preliminary, unconfirmed analytical test result and should not be used for non-medical purposes. Clinical consideration and professional judgment should be applied to any positive drug screen result due to possible interfering substances. A more specific alternate chemical method must be used in order to obtain a confirmed analytical result. Gas chromatography / mass spectrometry (GC/MS) is the preferred confirm                          atory method. Performed at [facility], [address] [place], [place] [address]    Color, Urine 11/12/2023 YELLOW (A)  YELLOW Final   APPearance 11/12/2023 CLEAR (A)  CLEAR Final   Specific Gravity, Urine 11/12/2023 1.019  1.005 - 1.030 Final   pH 11/12/2023 5.0  5.0 - 8.0 Final   Glucose, UA 11/12/2023 NEGATIVE  NEGATIVE mg/dL Final   Hgb urine dipstick 11/12/2023 NEGATIVE  NEGATIVE Final   Bilirubin Urine 11/12/2023 NEGATIVE  NEGATIVE Final   Ketones, ur 11/12/2023 NEGATIVE  NEGATIVE mg/dL Final   Protein, ur 90/81/7974 NEGATIVE  NEGATIVE mg/dL Final   Nitrite 90/81/7974 NEGATIVE  NEGATIVE Final   Leukocytes,Ua 11/12/2023 NEGATIVE  NEGATIVE Final   Performed at  [facility], [address] [place], [place] [address]   Troponin I (High Sensitivity) 11/12/2023 4  <18 ng/L Final   Comment: (NOTE) Elevated high sensitivity troponin I (hsTnI) values and significant  changes across serial measurements may suggest ACS but many other  chronic and acute conditions are known to elevate hsTnI results.  Refer to the Links section for chest pain algorithms and additional  guidance. Performed at [facility], [address] [place], [place]  [address]    B Natriuretic Peptide 11/12/2023 6.2  0.0 - 100.0 pg/mL Final   Performed at [facility], [address] [place], [place] [address]  Admission on 11/06/2023, Discharged on 11/10/2023  Component Date Value Ref Range Status   SARS Coronavirus 2 by RT PCR 11/09/2023 NEGATIVE  NEGATIVE Final   Comment: (NOTE) SARS-CoV-2 target nucleic acids are NOT DETECTED.  The SARS-CoV-2 RNA is generally detectable in upper respiratory specimens during the acute phase of infection. The lowest concentration of SARS-CoV-2 viral copies this assay can detect is 138 copies/mL. A negative result does not preclude SARS-Cov-2 infection and should not be used as the sole basis for treatment or other patient management decisions. A negative result may occur with  improper specimen collection/handling, submission of specimen other than nasopharyngeal swab, presence of viral mutation(s) within the areas targeted by this assay, and inadequate number of viral copies(<138 copies/mL). A negative result must be combined with clinical observations, patient history, and epidemiological information. The expected result is Negative.  Fact Sheet for Patients:  bloggercourse.com  Fact Sheet for Healthcare Providers:  seriousbroker.it  This test is no                          t yet approved or cleared by the [place]  FDA and  has been authorized for  detection and/or diagnosis of SARS-CoV-2 by FDA under an Emergency Use Authorization (EUA). This EUA will remain  in effect (meaning this test can be used) for the duration of the COVID-19 declaration under Section 564(b)(1) of the Act, 21 U.S.C.section 360bbb-3(b)(1), unless the authorization is terminated  or revoked sooner.       Influenza A by PCR 11/09/2023 NEGATIVE  NEGATIVE Final   Influenza B by PCR 11/09/2023 NEGATIVE  NEGATIVE Final   Comment: (NOTE) The Xpert Xpress SARS-CoV-2/FLU/RSV plus assay is intended as an aid in the diagnosis of influenza from Nasopharyngeal swab specimens and should not be used as a sole basis for treatment. Nasal washings and aspirates are unacceptable for Xpert Xpress SARS-CoV-2/FLU/RSV testing.  Fact Sheet for Patients: bloggercourse.com  Fact Sheet for Healthcare Providers: seriousbroker.it  This test is not yet approved or cleared by the [place]  FDA and has been authorized for detection and/or diagnosis of SARS-CoV-2 by FDA under an Emergency Use Authorization (EUA). This EUA will remain in effect (meaning this test can be used) for the duration of the COVID-19 declaration under Section 564(b)(1) of the Act, 21 U.S.C. section 360bbb-3(b)(1), unless the authorization is terminated or revoked.     Resp Syncytial Virus by PCR 11/09/2023 NEGATIVE  NEGATIVE Final   Comment: (NOTE) Fact Sheet for Patients: bloggercourse.com  Fact Sheet for Healthcare Providers: seriousbroker.it  This test is not yet approved or cleared by the [place]  FDA and has been authorized for detection and/or diagnosis of SARS-CoV-2 by FDA under an Emergency Use Authorization (EUA). This EUA will remain in effect (meaning this test can be used) for the duration of the COVID-19 declaration under Section 564(b)(1) of the Act, 21 U.S.C. section  360bbb-3(b)(1), unless the authorization is terminated or revoked.  Performed at [facility], [address] [place], [place] [address]   Admission on 11/05/2023, Discharged on 11/06/2023  Component Date Value Ref Range Status   Sodium 11/05/2023 139  135 - 145 mmol/L Final   Potassium 11/05/2023 3.8  3.5 - 5.1 mmol/L Final   Chloride 11/05/2023 101  98 -  111 mmol/L Final   CO2 11/05/2023 27  22 - 32 mmol/L Final   Glucose, Bld 11/05/2023 79  70 - 99 mg/dL Final   Glucose reference range applies only to samples taken after fasting for at least 8 hours.   BUN 11/05/2023 <5 (L)  6 - 20 mg/dL Final   Creatinine, Ser 11/05/2023 0.59 (L)  0.61 - 1.24 mg/dL Final   Calcium  11/05/2023 9.7  8.9 - 10.3 mg/dL Final   Total Protein 90/88/7974 9.0 (H)  6.5 - 8.1 g/dL Final   Albumin 90/88/7974 3.8  3.5 - 5.0 g/dL Final   AST 90/88/7974 24  15 - 41 U/L Final   ALT 11/05/2023 11  0 - 44 U/L Final   Alkaline Phosphatase 11/05/2023 89  38 - 126 U/L Final   Total Bilirubin 11/05/2023 0.3  0.0 - 1.2 mg/dL Final   GFR, Estimated 11/05/2023 >60  >60 mL/min Final   Comment: (NOTE) Calculated using the CKD-EPI Creatinine Equation (2021)    Anion gap 11/05/2023 12  5 - 15 Final   Performed at [facility], [address] [place], [place] [address]   Alcohol, Ethyl (B) 11/05/2023 192 (H)  <15 mg/dL Final   Comment: (NOTE) For medical purposes only. Performed at [facility], [address], [place], [place] [address]    Opiates 11/05/2023 NEGATIVE  NEGATIVE Final   Cocaine 11/05/2023 NEGATIVE  NEGATIVE Final   Benzodiazepines 11/05/2023 NEGATIVE  NEGATIVE Final   Amphetamines 11/05/2023 NEGATIVE  NEGATIVE Final   Tetrahydrocannabinol 11/05/2023 NEGATIVE  NEGATIVE Final   Barbiturates 11/05/2023 NEGATIVE  NEGATIVE Final   Methadone Scn, Ur 11/05/2023 NEGATIVE  NEGATIVE Final   Fentanyl  11/05/2023 NEGATIVE  NEGATIVE Final   Comment:  (NOTE) Drug screen is for Medical Purposes only. Positive results are preliminary only. If confirmation is needed, notify lab within 5 days.  Drug Class                 Cutoff (ng/mL) Amphetamine and metabolites 1000 Barbiturate and metabolites 200 Benzodiazepine              200 Opiates and metabolites     300 Cocaine and metabolites     300 THC                         50 Fentanyl                     5 Methadone                   300  Trazodone  is metabolized in vivo to several metabolites,  including pharmacologically active m-CPP, which is excreted in the  urine.  Immunoassay screens for amphetamines and MDMA have potential  cross-reactivity with these compounds and may provide false positive  result.  Performed at [facility], [address] [place], [place] [id number]    WBC 11/05/2023 12.5 (H)  4.0 - 10.5 K/uL Final   RBC 11/05/2023 4.90  4.22 - 5.81 MIL/uL Final   Hemoglobin 11/05/2023 14.2  13.0 - 17.0 g/dL Final   HCT 90/88/7974 45.6  39.0 - 52.0 % Final   MCV 11/05/2023 93.1  80.0 - 100.0 fL Final   MCH 11/05/2023 29.0  26.0 - 34.0 pg Final   MCHC 11/05/2023 31.1  30.0 - 36.0 g/dL Final   RDW 90/88/7974 14.5  11.5 - 15.5 % Final   Platelets 11/05/2023  464 (H)  150 - 400 K/uL Final   nRBC 11/05/2023 0.0  0.0 - 0.2 % Final   Neutrophils Relative % 11/05/2023 69  % Final   Neutro Abs 11/05/2023 8.6 (H)  1.7 - 7.7 K/uL Final   Lymphocytes Relative 11/05/2023 23  % Final   Lymphs Abs 11/05/2023 2.8  0.7 - 4.0 K/uL Final   Monocytes Relative 11/05/2023 6  % Final   Monocytes Absolute 11/05/2023 0.8  0.1 - 1.0 K/uL Final   Eosinophils Relative 11/05/2023 0  % Final   Eosinophils Absolute 11/05/2023 0.1  0.0 - 0.5 K/uL Final   Basophils Relative 11/05/2023 1  % Final   Basophils Absolute 11/05/2023 0.1  0.0 - 0.1 K/uL Final   Immature Granulocytes 11/05/2023 1  % Final   Abs Immature Granulocytes 11/05/2023 0.11 (H)  0.00 - 0.07 K/uL Final    Performed at [facility], [address], [place], [place] [address]   Acetaminophen  (Tylenol ), Serum 11/05/2023 <10 (L)  10 - 30 ug/mL Final   Comment: (NOTE) Toxic concentrations can be more effectively related to post dose interval; > 200, > 100, and > 50 ug/mL serum concentrations correspond to toxic concentrations at 4, 8, and 12 hours post dose, respectively.  Performed at [facility], [address] [place], [place] [address]    Salicylate Lvl 11/05/2023 <7.0 (L)  7.0 - 30.0 mg/dL Final   Performed at [facility], [address] [place], [place] [address]   Troponin T High Sensitivity 11/05/2023 <15  0 - 19 ng/L Final   Comment: (NOTE) Biotin concentrations > 1000 ng/mL falsely decrease TnT results.  Serial cardiac troponin measurements are suggested.  Refer to the Links section for chest pain algorithms and additional  guidance. Performed at [facility], [address], [place], [place] [address]   Admission on 08/23/2023, Discharged on 08/25/2023  Component Date Value Ref Range Status   Sodium 08/23/2023 136  135 - 145 mmol/L Final   Potassium 08/23/2023 3.5  3.5 - 5.1 mmol/L Final   Chloride 08/23/2023 99  98 - 111 mmol/L Final   CO2 08/23/2023 21 (L)  22 - 32 mmol/L Final   Glucose, Bld 08/23/2023 79  70 - 99 mg/dL Final   Glucose reference range applies only to samples taken after fasting for at least 8 hours.   BUN 08/23/2023 7  6 - 20 mg/dL Final   Creatinine, Ser 08/23/2023 0.66  0.61 - 1.24 mg/dL Final   Calcium  08/23/2023 8.8 (L)  8.9 - 10.3 mg/dL Final   Total Protein 93/70/7974 7.6  6.5 - 8.1 g/dL Final   Albumin 93/70/7974 4.2  3.5 - 5.0 g/dL Final   AST 93/70/7974 41  15 - 41 U/L Final   ALT 08/23/2023 25  0 - 44 U/L Final   Alkaline Phosphatase 08/23/2023 72  38 - 126 U/L Final   Total Bilirubin 08/23/2023 1.0  0.0 - 1.2 mg/dL Final   GFR, Estimated 08/23/2023 >60  >60  mL/min Final   Comment: (NOTE) Calculated using the CKD-EPI Creatinine Equation (2021)    Anion gap 08/23/2023 16 (H)  5 - 15 Final   Performed at [facility], [address], [place], [place] [address]   WBC 08/23/2023 9.9  4.0 - 10.5 K/uL Final   RBC 08/23/2023 5.15  4.22 - 5.81 MIL/uL Final   Hemoglobin 08/23/2023 16.2  13.0 - 17.0 g/dL Final   HCT  08/23/2023 47.7  39.0 - 52.0 % Final   MCV 08/23/2023 92.6  80.0 - 100.0 fL Final   MCH 08/23/2023 31.5  26.0 - 34.0 pg Final   MCHC 08/23/2023 34.0  30.0 - 36.0 g/dL Final   RDW 93/70/7974 14.6  11.5 - 15.5 % Final   Platelets 08/23/2023 214  150 - 400 K/uL Final   nRBC 08/23/2023 0.0  0.0 - 0.2 % Final   Neutrophils Relative % 08/23/2023 71  % Final   Neutro Abs 08/23/2023 7.0  1.7 - 7.7 K/uL Final   Lymphocytes Relative 08/23/2023 22  % Final   Lymphs Abs 08/23/2023 2.2  0.7 - 4.0 K/uL Final   Monocytes Relative 08/23/2023 6  % Final   Monocytes Absolute 08/23/2023 0.6  0.1 - 1.0 K/uL Final   Eosinophils Relative 08/23/2023 0  % Final   Eosinophils Absolute 08/23/2023 0.0  0.0 - 0.5 K/uL Final   Basophils Relative 08/23/2023 1  % Final   Basophils Absolute 08/23/2023 0.1  0.0 - 0.1 K/uL Final   Immature Granulocytes 08/23/2023 0  % Final   Abs Immature Granulocytes 08/23/2023 0.04  0.00 - 0.07 K/uL Final   Performed at [facility], [address] [place], [place] [address]   Alcohol, Ethyl (B) 08/23/2023 35 (H)  <15 mg/dL Final   Comment: (NOTE) For medical purposes only. Performed at [facility], [address], [place], [place] [address]    Opiates 08/23/2023 NONE DETECTED  NONE DETECTED Final   Cocaine 08/23/2023 NONE DETECTED  NONE DETECTED Final   Benzodiazepines 08/23/2023 POSITIVE (A)  NONE DETECTED Final   Amphetamines 08/23/2023 NONE DETECTED  NONE DETECTED Final   Tetrahydrocannabinol 08/23/2023 NONE DETECTED  NONE DETECTED Final   Barbiturates 08/23/2023  NONE DETECTED  NONE DETECTED Final   Comment: (NOTE) DRUG SCREEN FOR MEDICAL PURPOSES ONLY.  IF CONFIRMATION IS NEEDED FOR ANY PURPOSE, NOTIFY LAB WITHIN 5 DAYS.  LOWEST DETECTABLE LIMITS FOR URINE DRUG SCREEN Drug Class                     Cutoff (ng/mL) Amphetamine and metabolites    1000 Barbiturate and metabolites    200 Benzodiazepine                 200 Opiates and metabolites        300 Cocaine and metabolites        300 THC                            50 Performed at [facility], [address], [place], [place] [address]    HIV Screen 4th Generation wRfx 08/23/2023 Non Reactive  Non Reactive Final   Performed at [name], [address] [place], [place] [address]   WBC 08/24/2023 7.7  4.0 - 10.5 K/uL Final   RBC 08/24/2023 4.77  4.22 - 5.81 MIL/uL Final   Hemoglobin 08/24/2023 15.1  13.0 - 17.0 g/dL Final   HCT 93/69/7974 46.0  39.0 - 52.0 % Final   MCV 08/24/2023 96.4  80.0 - 100.0 fL Final   MCH 08/24/2023 31.7  26.0 - 34.0 pg Final   MCHC 08/24/2023 32.8  30.0 - 36.0 g/dL Final   RDW 93/69/7974 14.7  11.5 - 15.5 % Final   Platelets 08/24/2023 198  150 - 400 K/uL Final   nRBC 08/24/2023 0.0  0.0 -  0.2 % Final   Performed at [facility], [address], [place], [place] [address]   Sodium 08/24/2023 138  135 - 145 mmol/L Final   Potassium 08/24/2023 3.7  3.5 - 5.1 mmol/L Final   Chloride 08/24/2023 103  98 - 111 mmol/L Final   CO2 08/24/2023 27  22 - 32 mmol/L Final   Glucose, Bld 08/24/2023 88  70 - 99 mg/dL Final   Glucose reference range applies only to samples taken after fasting for at least 8 hours.   BUN 08/24/2023 10  6 - 20 mg/dL Final   Creatinine, Ser 08/24/2023 0.75  0.61 - 1.24 mg/dL Final   Calcium  08/24/2023 9.2  8.9 - 10.3 mg/dL Final   GFR, Estimated 08/24/2023 >60  >60 mL/min Final   Comment: (NOTE) Calculated using the CKD-EPI Creatinine Equation (2021)    Anion gap 08/24/2023 8  5 - 15 Final    Performed at [facility], [address], [place], [place] [address]   Magnesium  08/24/2023 2.0  1.7 - 2.4 mg/dL Final   Performed at [name], [address], [place], [place] [address]   Phosphorus 08/24/2023 4.1  2.5 - 4.6 mg/dL Final   Performed at [facility], [address] [place], [place] [address]   MRSA by PCR Next Gen 08/24/2023 NOT DETECTED  NOT DETECTED Final   Comment: (NOTE) The GeneXpert MRSA Assay (FDA approved for NASAL specimens only), is one component of a comprehensive MRSA colonization surveillance program. It is not intended to diagnose MRSA infection nor to guide or monitor treatment for MRSA infections. Test performance is not FDA approved in patients less than 62 years old. Performed at [facility], [address], [place], [place] [address]     Blood Alcohol level:  Lab Results  Component Value Date   ETH 87 (H) 02/09/2024   ETH 159 (H) 11/20/2023    Metabolic Disorder Labs: Lab Results  Component Value Date   HGBA1C 5.2 08/07/2023   MPG 102.54 08/07/2023   No results found for: PROLACTIN Lab Results  Component Value Date   CHOL 278 (H) 08/07/2023   TRIG 125 08/07/2023   HDL 56 08/07/2023   CHOLHDL 5.0 08/07/2023   VLDL 25 08/07/2023   LDLCALC 197 (H) 08/07/2023    Therapeutic Lab Levels: No results found for: LITHIUM Lab Results  Component Value Date   VALPROATE 40 (L) 10/07/2019   VALPROATE 84 09/12/2019   No results found for: CBMZ  Physical Findings   AUDIT    Flowsheet Row [facility]) [facility] Ad[facility] (Discharged) from 09/09/2019 in [facility]  Alcohol Use Disorder Identification Test Final Score (AUDIT) 13 6   GAD-7    Flowsheet Row Integrated Behavioral Health [facility]19/2019 [facility]  Total GAD-7 Score 5   PHQ2-9     Flowsheet Row ED from 02/09/2024 in [facility] ED from 08/05/2023 in [facility] Integrated Behavioral Health from 07/19/2019 in [facility]  PHQ-2 Total Score 2 2 4   PHQ-9 Total Score 9 7 10    Flowsheet Row ED from 02/09/2024 in [facility] Most recent reading at 02/11/2024 11:43 AM ED from 02/09/2024 in [facility] Emergency Department at [facility] Most recent reading at 02/09/2024  8:16 AM ED from 11/20/2023 in [facility] Emergency  Department at [facility] Most recent reading at 11/21/2023 11:58 AM  C-SSRS RISK CATEGORY No Risk No Risk Error: Q3, 4, or 5 should not be populated when Q2 is No     Musculoskeletal  Strength & Muscle Tone: within normal limits Gait & Station: normal Patient leans: N/A  Psychiatric Specialty Exam  Presentation  General Appearance:  Appropriate for Environment  Eye Contact: Fair  Speech: Clear and Coherent  Speech Volume: Normal  Handedness: Right   Mood and Affect  Mood: Irritable  Affect: Appropriate   Thought Process  Thought Processes: Coherent; Goal Directed  Descriptions of Associations:Intact  Orientation:Full (Time, Place and Person)  Thought Content:WDL  Diagnosis of Schizophrenia or Schizoaffective disorder in past: No    Hallucinations:Hallucinations: None  Ideas of Reference:None  Suicidal Thoughts:Suicidal Thoughts: No  Homicidal Thoughts:Homicidal Thoughts: No   Sensorium  Memory: Immediate Fair  Judgment: Fair  Insight: Fair   Art Therapist  Concentration: Fair  Attention Span: Fair  Recall: Fair  Fund of Knowledge: Fair  Language: Fair   Psychomotor Activity  Psychomotor Activity: Psychomotor Activity: Normal   Assets  Assets: Communication Skills   Sleep  Sleep: Sleep: Good  Estimated Sleeping Duration (Last 24 Hours): 11.00-13.00  hours  Nutritional Assessment (For OBS and FBC admissions only) Has the patient had a weight loss or gain of 10 pounds or more in the last 3 months?: No Has the patient had a decrease in food intake/or appetite?: No Does the patient have dental problems?: No Does the patient have eating habits or behaviors that may be indicators of an eating disorder including binging or inducing vomiting?: No Has the patient recently lost weight without trying?: 0 Has the patient been eating poorly because of a decreased appetite?: 0 Malnutrition Screening Tool Score: 0    Physical Exam  Physical Exam Vitals reviewed.  HENT:     Head: Normocephalic and atraumatic.     Nose: Nose normal.     Mouth/Throat:     Pharynx: Oropharynx is clear.  Cardiovascular:     Rate and Rhythm: Normal rate.  Pulmonary:     Effort: Pulmonary effort is normal.  Musculoskeletal:        General: Normal range of motion.     Cervical back: Normal range of motion.  Neurological:     Mental Status: He is alert and oriented to person, place, and time.  Psychiatric:        Attention and Perception: Attention and perception normal.        Mood and Affect: Affect is angry.        Speech: Speech normal.        Behavior: Behavior is agitated.        Thought Content: Thought content normal.        Cognition and Memory: Cognition normal.        Judgment: Judgment is impulsive.    Review of Systems  Psychiatric/Behavioral:  Positive for substance abuse.   All other systems reviewed and are negative.  Blood pressure 117/69, pulse 82, temperature (!) 97.5 F (36.4 C), resp. rate 18, SpO2 96%. There is no height or weight on file to calculate BMI.  Treatment Plan Summary:  PRNs started yesterday -Continue Tylenol  650 mg PO every 6 hours as needed for mild pain -Continue Zofran  4 mg PO  every 6 hours as needed for nausea or vomiting -Continue Imodium  2 to 4 mg PO as needed for diarrhea or loose stools -Continue Atarax   25mg  PO, oral, 3 times daily PRN, anxiety -Continue Thiamine  100mg  PO daily- prevent Wernicke-Korsakoff Syndrome -Continue Maalox/Mylanta 30 mL every 4 hours as needed for indigestion -Continue Milk of Mag 30 mL as needed for constipation   -Continue Desyrel , 50mg , oral, at bedtime PRN, sleep -Continue agitation protocol -Continue librium  detox protocol -Continue CIWA protocol -Continue nicotine  patch 21mg  Daily - Smoking cessation -Continue Ventolin  165mcg/act inhaler 1-2 puff for SOB/wheezing  Home Meds started yesterday -Continue Doxycyline 100 mg PO Q12HRS for Armpit Boils -Continue Lexapro  5mg  PO daily -MDD -Continue Gabapentin  300 mg PO Q8 HRS    Medication management and Plan: D/C pt on 02/11/24 to self Tosin Kasean Denherder, NP 02/11/2024 3:53 PM

## 2024-02-11 NOTE — ED Notes (Signed)
 Patient is currently sitting in the dayroom watching television, patients respirations are even and unlabored, patient appears to be in no acute distress, will continue to monitor patient for safety.

## 2024-02-11 NOTE — Group Note (Signed)
 Group Topic: Emotional Regulation  Group Date: 02/11/2024 Start Time: 1300 End Time: 1345 Facilitators: Laneta Renea POUR, NT; Deidre Prentis CROME, NT  Department: [facility]  Number of Participants: 4  Group Focus: communication, coping skills, and psychiatric education Treatment Modality:  Psychoeducation Interventions utilized were group exercise and problem solving Purpose: enhance coping skills and increase insight  Name: John [name] Date of Birth: 1980-09-19  MR: [id number]     Level of Participation: active Quality of Participation: cooperative and motivated Interactions with others: gave feedback Mood/Affect: appropriate and positive Triggers (if applicable): None Cognition: coherent/clear and insightful Progress: Gaining insight Response:  I am taking it one day at a time.  Plan: patient will be encouraged to attend group.  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

## 2024-02-12 DIAGNOSIS — F845 Asperger's syndrome: Secondary | ICD-10-CM | POA: Diagnosis not present

## 2024-02-12 DIAGNOSIS — F411 Generalized anxiety disorder: Secondary | ICD-10-CM | POA: Diagnosis not present

## 2024-02-12 DIAGNOSIS — F1014 Alcohol abuse with alcohol-induced mood disorder: Secondary | ICD-10-CM | POA: Diagnosis not present

## 2024-02-12 DIAGNOSIS — F32A Depression, unspecified: Secondary | ICD-10-CM | POA: Diagnosis not present

## 2024-02-12 MED ORDER — DOXYCYCLINE HYCLATE 100 MG PO TABS: 100.0000 mg | ORAL_TABLET | Freq: Two times a day (BID) | ORAL | Status: DC

## 2024-02-12 MED ORDER — ESCITALOPRAM OXALATE 10 MG PO TABS: 10.0000 mg | ORAL_TABLET | Freq: Every day | ORAL | 0 refills | Status: AC

## 2024-02-12 MED ORDER — GABAPENTIN 300 MG PO CAPS
600.0000 mg | ORAL_CAPSULE | Freq: Three times a day (TID) | ORAL | Status: DC
  Filled 2024-02-12: qty 42

## 2024-02-12 MED ORDER — GABAPENTIN 600 MG PO TABS: 600.0000 mg | ORAL_TABLET | Freq: Three times a day (TID) | ORAL | 0 refills | Status: AC

## 2024-02-12 MED ORDER — NICOTINE 21 MG/24HR TD PT24: 21.0000 mg | MEDICATED_PATCH | Freq: Every day | TRANSDERMAL | Status: AC

## 2024-02-12 MED ORDER — ESCITALOPRAM OXALATE 10 MG PO TABS
10.0000 mg | ORAL_TABLET | Freq: Every day | ORAL | Status: DC
  Filled 2024-02-12: qty 7

## 2024-02-12 NOTE — Discharge Instructions (Addendum)
 [facility] Care Management...  Base on the information you have provided and the presenting issue, outpatient services and resources for substance abuse treatment have been recommended.  It is imperative that you follow through with treatment recommendations within 5-7 days from the of discharge to mitigate further risk to your safety and mental well-being. A list of referrals has been provided below to get you started.  You are not limited to the list provided.  In case of an urgent crisis, you may contact the Mobile Crisis Unit with [facility] at [phone].  Patient declined inpatient and outpatient treatment  Patient will be discharged 02/12/2024  Patient will follow-up with [facility] Peer Support Specialist Har[place] and [place] Phone [phone] Main [phone]   Peer support will transport  7 day sample 30 day scripts  Community Resource Guide Outpatient Counseling/Substance Abuse Adult [employer] 211 is a great source of information about community services available.  Access by dialing 2-1-1 from anywhere in [place] , or by website -  pooledincome.pl.   Other Local Resources (Updated 12/2015)  Crisis Hotlines   Services     Area Served  [employer] Crisis Hotline, available 24 hours a day, 7 days a week: 980-086-6200 [facility] [place], [place]  [facility] Crisis Hotline, available 24 hours a day, 7 days a week: 513-300-2019 [facility], [place]  [facility] Suicide Prevention Hotline, available 24 hours a day, 7 days a week: 980-095-1649 [facility], [place]  [employer], available 24 hours a day, 7 days a week: 574-874-9822 or (250) 197-4956 [facility], [place]   [facility] Access to [employer], available 24 hours a day, 7 days a week: (715) 650-4245 All   [facility] Crisis Hotline, available 24 hours a day, 7 days a week: 236-796-4602 All   Other  Local Resources (Updated 12/2015)  Outpatient Counseling/ Substance Abuse Programs  Services     Address and Phone Number  [facility] ([facility])  Options include Individual counseling, group counseling, intensive outpatient program (several hours a day, several days a week) Offers depression assessments Provides methadone maintenance program [phone] [address] [place], [place] [address]   [facility]  Offers partial hospitalization/day treatment and DUI/DWI programs [employer], private insurance [phone] [address], [address] [place], [place] [address]  [facility] [place], [place], private insurance [phone] [address] [place], [place]  [facility]   Services include intensive outpatient program (several hours a day, several days a week), outpatient treatment, DUI/DWI services, family education Also has some services specifically for [employer] transitional housing  [phone] [address] [place], [place] [address]     [facility] Accepts Medicare, private pay, and private insurance [phone] [address] [place], [place] [address]  [employer] of Care Services include individual counseling, substance abuse intensive outpatient program (several hours a day, several days a week), day treatment Florinda Many, Medicaid, private insurance [phone] [address], [address] [place], [place] [address]  [facility]  Offers substance abuse intensive outpatient program (several hours a day, several days a week), partial hospitalization program [phone] [address] [place], [place] [address]  [phone] [address] [place], [place] [address]  [phone] [address] [place], [place] [address]  [phone], [address] [place], [place]  [address]  [facility] Individual counseling only Accepts private insurance only [phone]  [address] [place], [place] [address]  [facility] Methadone maintenance program [phone] [address] [place], [place] [address]  [facility] providing substance abuse and mental health counseling Accepts Medicaid, Medicare, private insurance Offers sliding scale for uninsured [phone] [address] [place], [place]   [facility] [place], [employer]. Offers individual counseling, and intensive in-home services [phone] [address] [place], [place] [address]  [facility] individual counseling, family counseling, group therapy, domestic violence counseling, consumer credit counseling Accepts Medicare, Medicaid, private insurance Offers sliding scale for uninsured [phone] [address] [place], [place] [address]  [phone] [facility], [address] [place], [place] [address]  [facility] Offers individual, family and group counseling 3 locations - [place], [place], and [place]  [phone]  [address]  [place], [place] [address]  [address] [place], [place] [address]  [address] [place], [place] [address]  [facility]   Offers psychiatric assessment, 8-week Intensive Outpatient Program (several hours a day, several times a week, daytime or evenings), early recovery group, family Program, medication management Private pay or private insurance only [phone], or  [phone] [address] [place], [place] [address]  [employer] individual, couples and family counseling Accepts Medicaid, private insurance, and sliding scale for uninsured [phone] [address] [place], [place] [address]  Alm Riding, MD Individual counseling Private insurance [phone] [address] [place], [place] [address]  [facility]  Offers assessment, substance abuse treatment, and behavioral health treatment [phone] [address] [place], [place] [address]  [facility] Individual counseling Accepts private insurance [phone] [address] [place], [place] [address]  [facility] Individual counseling Florinda Many, private insurance [phone] [address] [place], [place] [address]  [facility]   Serves children and adolescents ages 38 to 42 years of age Offers 90-day or longer intensive outpatient programs (3 hours a day, 3 days a week) Cost is $5000/month. Facility is out of network with all insurance companies. Accepts private pay. Does not accept Medicaid.   Offers scholarships and financial assistance [phone] [facility] [place], [place]  [facility] Individual counseling Medicare, private insurance [phone] [address], [address] [place], [place] [address]  [facility]   Offers intensive outpatient program (several hours a day, several times a week) and partial hospitalization program [phone] [address] [place], [place] [address]  Elodie Anon, MD Individual counseling [phone] [address], [address] [place], [place] [address]  [facility] Offers Christian counseling to individuals, couples, and families Accepts Medicare and private insurance; offers sliding scale for uninsured [phone] [address] [place], [place] [address]  [facility] counseling [phone] [address] [place], [place] [address]  [facility] [employer] crisis counseling, individual counseling, group therapy, in-home therapy, domestic violence services, day treatment, DWI services, Administrator, Arts (CST), Doctor, Hospital ([facility]), substance abuse  Intensive Outpatient Program (several hours a day, several times a week) 2 locations - [place] and [place] [phone] [address] [place], [place] [address]  [phone] [address] [place], [place] [address]  [facility]    Individual counseling and group therapy Accepts private insurance, [place], [place] [phone] [address] [place], [place]  [facility] Offers individual and family counseling Offers LGBTQ services Accepts private insurance and private pay  [phone] [address] [place], [place] [address]  [facility] Full service practice for adult and pediatric patients Monday-Friday; 8:00a - 5:00p [phone] (Multiple locations)  [facility] [employer] individual counseling, group therapy, and outpatient detox Accepts private insurance [phone] [address] [place], [place]  [facility] Individual counseling Accepts Medicare, private insurance [phone] [address] [place], [place] [address]  [employer] Individual counseling Accepts Medicare, private insurance [phone] [address] [place], [place] [address]  Valerio Hammans [facility]  Offers substance abuse Intensive Outpatient Program (several hours a day, several times a week) [phone], or [phone] [place], [place]

## 2024-02-12 NOTE — ED Notes (Signed)
 Patient A&Ox4. Denies intent to harm self/others when asked. Denies A/VH. Patient denies any physical complaints when asked. No acute distress noted. Support and encouragement provided. Routine safety checks conducted according to facility protocol. Encouraged patient to notify staff if thoughts of harm toward self or others arise. Patient verbalize understanding and agreement. Will continue to monitor for safety.

## 2024-02-12 NOTE — Care Management (Addendum)
 [facility] Care Management...  Patient declined inpatient and out patient treatment  Patient will discharge today 02/12/2024  Per text message received from [facility] @ 4:35 pm 02/11/24...  We are able to do our own intakes for the [employer]. Looking to be by tomorrow morning to complete paperwork and move in is set for noon tomorrow.

## 2024-02-12 NOTE — Group Note (Signed)
 Group Topic: Change and Accountability  Group Date: 02/11/2024 Start Time: 1000 End Time: 1050 Facilitators: Gerome Jolly, NT  Department: [facility]  Number of Participants: 3  Group Focus: acceptance Treatment Modality:  Cognitive Behavioral Therapy and Solution-Focused Therapy Interventions utilized were other acceptance and commitment and support Purpose: Facilitator engaged the group in a activity worksheets Circle of Control helping clients distinguish between what they can and cannot control, explore maladaptive thinking, increase insight, relapse prevention strategies  Name: John [name] Date of Birth: May 18, 1980  MR: [id number]    Level of Participation: patient did not attend group Quality of Participation: na Interactions with others: na Mood/Affect: na Triggers (if applicable): na Cognition: na Progress: na Response: Patient did not attend group Plan: Patient declined inpatient and out patient resources. Patient reported only looking for housing. Patient connected to Peer Support for services  Patients Problems:  Patient Active Problem List   Diagnosis Date Noted   GAD (generalized anxiety disorder) 02/12/2024   Chest pain of uncertain etiology 02/05/2024   Alcohol use disorder, severe, dependence (HCC) 11/06/2023   Alcohol withdrawal (HCC) 08/23/2023   Tachycardia 08/23/2023   Alcohol dependence with uncomplicated withdrawal (HCC) 08/23/2023   MDD (major depressive disorder) 08/05/2023   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 Hx urethroplasty for urethral trauma in 2020 04/30/2019   Alcohol abuse with intoxication 04/30/2019   Acute Femoral condyle fracture (HCC) 04/30/2019    Alcohol abuse 01/14/2018   Substance induced mood disorder (HCC) 01/14/2018

## 2024-02-12 NOTE — ED Notes (Signed)
 Patient asleep.

## 2024-02-12 NOTE — ED Provider Notes (Signed)
 FBC/OBS ASAP Discharge Summary  Date and Time: 02/12/2024 8:41 AM  Name: John [name]  MRN:  [id number]   Discharge Diagnoses:  Final diagnoses:  Alcohol-induced mood disorder with depressive symptoms (HCC)  GAD (generalized anxiety disorder)   Subjective: Patient is anxious about discharge today. He is aware peer support is coming and plan is for a pilot program. Medications have been well-tolerated but he's unsure if escitalopram  is useful at 5mg  dose. He scarcely notices the gabapentin  300 TID which is much lower than his usual dose. He would prefer to resume outpatient dose prior to discharge. He understands that he needs to continue doxycycline  for his arm abscess and will utilize samples to complete 7d course. He declines nicotine  patch, naltrexone , and trazodone  PRN prescriptions because he has a supply of each already that he plans to resume after discharge.   Stay Summary: Patient is a 43 year old male with a history of hypertension, ASD, prior H. pylori, chronic alcohol use, depression and bipolar disease who presented to [facility] reporting that he is suicidal. Patient admitted to drinking significant amounts of alcohol usually on a daily basis anywhere between 3 or 4 40 ounce cans of beer daily. He last drank around 1 AM 12/16. He also uses marijuana products. He reports that he became suicidal the previous day after he and his dad got in an argument and his dad kicked him out of the house. He now has nowhere to go and thought he would just jump off a bridge or run in front of a car. He has tried to kill himself in the past by cutting his wrists. He has psychiatric medications but has not been taking any of them for unknown reasons. He is chronically noncompliant with medication. He does report getting tremors with alcohol withdrawal but has never had seizures.   Patient transferred to [facility] 12/17 for multimodal treatment of alcohol use disorder. . Patient had a slow adjustment to milieu. The  patient was evaluated each day by a clinical provider to ascertain response to treatment. Escitalopram  resumed at 5mg  along with gabapentin  300mg  TID. Doxycyline provided for arm wound/abscess. Improvement was incremental as noted by the patient's report of decreasing symptoms, improved sleep and appetite, affect, medication tolerance, behavior, and participation in unit programming. Patient's care was discussed during the interdisciplinary team meeting every day during the hospitalization. Patient was asked each day to complete a self inventory noting mood, mental status, pain, new symptoms, anxiety and concerns. Labs were reviewed with the patient, and abnormal results were discussed with the patient.   Symptoms were reported as significantly decreased by discharge. The patient denied having side effects to prescribed psychiatric medication. Escitalopram  increased to a therapeutic dose 10mg  and gabapentin  increased to outpatient dose on day of discharge. Patient reported plan to utilize home supply of naltrexone , trazodone  prn, and nicotine  patches. He was provided with samples to complete doxycycline  course. Supportive instructions provided on lifestyle optimization for health.  Total Time spent with patient: I personally spent 20 minutes on the unit in direct patient care. The direct patient care time included face-to-face time with the patient, reviewing the patient's chart, communicating with other professionals, and coordinating care. Greater than 50% of this time was spent in counseling or coordinating care with the patient regarding goals of hospitalization, psycho-education, and discharge planning needs.   On my assessment the patient denied SI, HI, AVH, paranoia, ideas of reference, or first rank symptoms on day of discharge. Patient denied significant drug cravings or active  signs of withdrawal. Patient denied medication side-effects but did note need for higher dose. Patient was not deemed to be a  danger to self or others on day of discharge and was in agreement with discharge plans  Past Psychiatric History: Prev Dx/Sx: Bipolar depression and anxiety Current Psych Provider: Denies  Home Meds (current): Yes not compliant Previous Med Trials: Yes Therapy: Denies    Prior Psych Hospitalization: Yes Prior Self Harm: Denies  Prior Violence: Denies    Family Psych History: Denies  Family Hx suicide: Denies   Substance History Alcohol: Yes Type of alcohol varies, mainly beer Last Drink this morning Number of drinks per day 3-4, 40 ounce beers History of alcohol withdrawal seizures denies History of DT's yes Tobacco: Yes Illicit drugs: Yes Prescription drug abuse: Denies  Rehab hx:The patient reports a history of detox treatment at [facility] in [place], which he found beneficial, and states he previously had an AA sponsor, which he found helpful and motivating.  Past Medical History: HTN  Social History:  Developmental Hx: Deferred Educational Hx: Graduated high school Occupational Hx: Unemployed Legal Hx: Denies  Living Situation: Lives off-and-on with father Spiritual Hx: Yes Access to weapons/lethal means: Denies   Tobacco Cessation:  Prescription not provided because: patient reports supply of patches at home.  Current Medications:  Current Facility-Administered Medications  Medication Dose Route Frequency Provider Last Rate Last Admin   acetaminophen  (TYLENOL ) tablet 650 mg  650 mg Oral Q6H PRN Motley-Mangrum, Jadeka A, PMHNP   650 mg at 02/10/24 1545   albuterol  (VENTOLIN  HFA) 108 (90 Base) MCG/ACT inhaler 1-2 puff  1-2 puff Inhalation Q6H PRN Nkwenti, Doris, NP       alum & mag hydroxide-simeth (MAALOX/MYLANTA) 200-200-20 MG/5ML suspension 30 mL  30 mL Oral Q4H PRN Motley-Mangrum, Jadeka A, PMHNP       chlordiazePOXIDE  (LIBRIUM ) capsule 25 mg  25 mg Oral Q6H PRN Motley-Mangrum, Jadeka A, PMHNP       haloperidol  (HALDOL ) tablet 5 mg  5 mg Oral TID PRN  Motley-Mangrum, Jadeka A, PMHNP       And   diphenhydrAMINE  (BENADRYL ) capsule 50 mg  50 mg Oral TID PRN Motley-Mangrum, Jadeka A, PMHNP       haloperidol  lactate (HALDOL ) injection 5 mg  5 mg Intramuscular TID PRN Motley-Mangrum, Jadeka A, PMHNP       And   diphenhydrAMINE  (BENADRYL ) injection 50 mg  50 mg Intramuscular TID PRN Motley-Mangrum, Jadeka A, PMHNP       And   LORazepam  (ATIVAN ) injection 2 mg  2 mg Intramuscular TID PRN Motley-Mangrum, Jadeka A, PMHNP       haloperidol  lactate (HALDOL ) injection 10 mg  10 mg Intramuscular TID PRN Motley-Mangrum, Jadeka A, PMHNP       And   diphenhydrAMINE  (BENADRYL ) injection 50 mg  50 mg Intramuscular TID PRN Motley-Mangrum, Jadeka A, PMHNP       And   LORazepam  (ATIVAN ) injection 2 mg  2 mg Intramuscular TID PRN Motley-Mangrum, Jadeka A, PMHNP       doxycycline  (VIBRA -TABS) tablet 100 mg  100 mg Oral Q12H Motley-Mangrum, Jadeka A, PMHNP   100 mg at 02/11/24 2112   escitalopram  (LEXAPRO ) tablet 5 mg  5 mg Oral Daily Motley-Mangrum, Jadeka A, PMHNP   5 mg at 02/11/24 0954   gabapentin  (NEURONTIN ) capsule 300 mg  300 mg Oral Q8H Motley-Mangrum, Jadeka A, PMHNP   300 mg at 02/12/24 0635   hydrOXYzine  (ATARAX ) tablet 25 mg  25 mg Oral Q6H PRN Motley-Mangrum, Jadeka A, PMHNP   25 mg at 02/11/24 2112   hydrOXYzine  (ATARAX ) tablet 50 mg  50 mg Oral TID PRN Motley-Mangrum, Jadeka A, PMHNP   50 mg at 02/11/24 1352   loperamide  (IMODIUM ) capsule 2-4 mg  2-4 mg Oral PRN Tex Drilling, NP       magnesium  hydroxide (MILK OF MAGNESIA) suspension 30 mL  30 mL Oral Daily PRN Motley-Mangrum, Jadeka A, PMHNP       nicotine  (NICODERM CQ  - dosed in mg/24 hours) patch 21 mg  21 mg Transdermal Daily Motley-Mangrum, Jadeka A, PMHNP   21 mg at 02/11/24 0957   ondansetron  (ZOFRAN -ODT) disintegrating tablet 4 mg  4 mg Oral Q6H PRN Tex Drilling, NP       thiamine  (VITAMIN B1) tablet 100 mg  100 mg Oral Daily Nkwenti, Doris, NP   100 mg at 02/11/24 9046   traZODone   (DESYREL ) tablet 100 mg  100 mg Oral QHS PRN Motley-Mangrum, Jadeka A, PMHNP   100 mg at 02/11/24 2113   Current Outpatient Medications  Medication Sig Dispense Refill   albuterol  (VENTOLIN  HFA) 108 (90 Base) MCG/ACT inhaler Inhale 2 puffs into the lungs every 6 (six) hours as needed for wheezing or shortness of breath. 8 g 2   doxycycline  (VIBRA -TABS) 100 MG tablet Take 1 tablet (100 mg total) by mouth every 12 (twelve) hours.     escitalopram  (LEXAPRO ) 10 MG tablet Take 1 tablet (10 mg total) by mouth daily. 30 tablet 0   gabapentin  (NEURONTIN ) 600 MG tablet Take 1 tablet (600 mg total) by mouth 3 (three) times daily. 90 tablet 0   naltrexone  (DEPADE) 50 MG tablet Take 1 tablet (50 mg total) by mouth daily. (Patient not taking: Reported on 02/09/2024) 30 tablet 0   nicotine  (NICODERM CQ  - DOSED IN MG/24 HOURS) 21 mg/24hr patch Place 1 patch (21 mg total) onto the skin daily.     traZODone  (DESYREL ) 100 MG tablet Take 1 tablet (100 mg total) by mouth at bedtime as needed for sleep. (Patient not taking: Reported on 02/09/2024) 14 tablet 0    PTA Medications:  Facility Ordered Medications  Medication   [COMPLETED] thiamine  (VITAMIN B1) tablet 100 mg   chlordiazePOXIDE  (LIBRIUM ) capsule 25 mg   doxycycline  (VIBRA -TABS) tablet 100 mg   hydrOXYzine  (ATARAX ) tablet 25 mg   nicotine  (NICODERM CQ  - dosed in mg/24 hours) patch 21 mg   escitalopram  (LEXAPRO ) tablet 5 mg   gabapentin  (NEURONTIN ) capsule 300 mg   hydrOXYzine  (ATARAX ) tablet 50 mg   traZODone  (DESYREL ) tablet 100 mg   acetaminophen  (TYLENOL ) tablet 650 mg   alum & mag hydroxide-simeth (MAALOX/MYLANTA) 200-200-20 MG/5ML suspension 30 mL   magnesium  hydroxide (MILK OF MAGNESIA) suspension 30 mL   haloperidol  (HALDOL ) tablet 5 mg   And   diphenhydrAMINE  (BENADRYL ) capsule 50 mg   haloperidol  lactate (HALDOL ) injection 5 mg   And   diphenhydrAMINE  (BENADRYL ) injection 50 mg   And   LORazepam  (ATIVAN ) injection 2 mg   haloperidol   lactate (HALDOL ) injection 10 mg   And   diphenhydrAMINE  (BENADRYL ) injection 50 mg   And   LORazepam  (ATIVAN ) injection 2 mg   thiamine  (VITAMIN B1) tablet 100 mg   loperamide  (IMODIUM ) capsule 2-4 mg   ondansetron  (ZOFRAN -ODT) disintegrating tablet 4 mg   albuterol  (VENTOLIN  HFA) 108 (90 Base) MCG/ACT inhaler 1-2 puff   PTA Medications  Medication Sig   traZODone  (DESYREL ) 100 MG tablet Take 1  tablet (100 mg total) by mouth at bedtime as needed for sleep. (Patient not taking: Reported on 02/09/2024)   naltrexone  (DEPADE) 50 MG tablet Take 1 tablet (50 mg total) by mouth daily. (Patient not taking: Reported on 02/09/2024)   albuterol  (VENTOLIN  HFA) 108 (90 Base) MCG/ACT inhaler Inhale 2 puffs into the lungs every 6 (six) hours as needed for wheezing or shortness of breath.   doxycycline  (VIBRA -TABS) 100 MG tablet Take 1 tablet (100 mg total) by mouth every 12 (twelve) hours.   escitalopram  (LEXAPRO ) 10 MG tablet Take 1 tablet (10 mg total) by mouth daily.   nicotine  (NICODERM CQ  - DOSED IN MG/24 HOURS) 21 mg/24hr patch Place 1 patch (21 mg total) onto the skin daily.   gabapentin  (NEURONTIN ) 600 MG tablet Take 1 tablet (600 mg total) by mouth 3 (three) times daily.       02/10/2024    4:43 PM 08/09/2023   10:05 AM 08/06/2023   11:20 AM  Depression screen PHQ 2/9  Decreased Interest 1 1 1   Down, Depressed, Hopeless 1 1 1   PHQ - 2 Score 2 2 2   Altered sleeping 1 1 1   Tired, decreased energy 1 1 1   Change in appetite 1 1 1   Feeling bad or failure about yourself  1 1 1   Trouble concentrating 1 1 1   Moving slowly or fidgety/restless 1 0 0  Suicidal thoughts 1 0 1  PHQ-9 Score 9 7  8    Difficult doing work/chores Somewhat difficult       Data saved with a previous flowsheet row definition    Flowsheet Row ED from 02/09/2024 in [facility] Most recent reading at 02/11/2024 11:43 AM ED from 02/09/2024 in [facility] Emergency Department at [facility] Most recent reading at 02/09/2024  8:16 AM ED from 11/20/2023 in [facility] Emergency Department at [facility] Most recent reading at 11/21/2023 11:58 AM  C-SSRS RISK CATEGORY No Risk No Risk Error: Q3, 4, or 5 should not be populated when Q2 is No    Musculoskeletal  Strength & Muscle Tone: within normal limits Gait & Station: normal Patient leans: N/A  Psychiatric Specialty Exam  Presentation  General Appearance:  Appropriate for Environment; Disheveled  Eye Contact: Fair  Speech: Clear and Coherent  Speech Volume: Decreased  Handedness: Right   Mood and Affect  Mood: Anxious  Affect: Congruent   Thought Process  Thought Processes: Goal Directed  Descriptions of Associations:Intact  Orientation:Full (Time, Place and Person)  Thought Content:Logical  Diagnosis of Schizophrenia or Schizoaffective disorder in past: No    Hallucinations:Hallucinations: None  Ideas of Reference:None  Suicidal Thoughts:Suicidal Thoughts: No  Homicidal Thoughts:Homicidal Thoughts: No   Sensorium  Memory: Immediate Fair  Judgment: Fair  Insight: Fair   Art Therapist  Concentration: Fair  Attention Span: Fair  Recall: Fair  Fund of Knowledge: Fair  Language: Fair   Psychomotor Activity  Psychomotor Activity: Psychomotor Activity: Normal   Assets  Assets: Desire for Improvement   Sleep  Sleep: Sleep: Fair  Estimated Sleeping Duration (Last 24 Hours): 11.25-13.50 hours  No data recorded  Physical Exam  Physical Exam ROS Blood pressure 105/70, pulse 72, temperature 97.7 F (36.5 C), temperature source Oral, resp. rate 19, SpO2 94%. There is no height or weight on file to calculate BMI.  Demographic Factors:  Male and Unemployed  Loss Factors: NA  Historical Factors: Impulsivity  Risk Reduction Factors:   NA  Continued Clinical Symptoms:  Depression:   Impulsivity Insomnia Alcohol/Substance  Abuse/Dependencies More than one psychiatric diagnosis Previous Psychiatric Diagnoses and Treatments  Cognitive Features That Contribute To Risk:  None    Suicide Risk:  Mild:  Suicidal ideation of limited frequency, intensity, duration, and specificity.  There are no identifiable plans, no associated intent, mild dysphoria and related symptoms, good self-control (both objective and subjective assessment), few other risk factors, and identifiable protective factors, including available and accessible social support.  Plan Of Care/Follow-up recommendations:  Activity: Daily physical activity; reduce sedentary behaviors   Diet: Portion-limited diet rich in produce, whole (minimally processed) grains, nuts/seeds, eggs, beans/legumes, seafood, lowfat dairy (if tolerated), fermented food, lean meat. Copious water intake. Limit (ultra)processed foods and sugar-sweetened beverages.    Other: -Follow-up with your outpatient psychiatric provider -instructions on appointment date, time, and address (location) are provided to you in discharge paperwork.   -Take your psychiatric medications as prescribed at discharge - escitalopram  10mg , gabapentin  600mg  TID, trazodone  100mg  at bedtime prn, resume home supply naltrexone , nicotine  patch.   -Follow-up with outpatient primary care doctor to optimize health maintenance/preventative care and other specialists -for management of chronic medical disease. Care for abscess.   -Recommend total abstinence from alcohol, tobacco, and other illicit drug use at discharge. Followup with pilot program.   -If your psychiatric symptoms recur, worsen, or if you have side effects to your psychiatric medications, call your outpatient psychiatric provider, 911, [phone] or go to the nearest emergency department.   -If suicidal thoughts occur, immediately call your outpatient psychiatric provider, 911, [phone] or go to the nearest emergency department.  Disposition: Discharge with  peer support specialist for transition to a pilot program.  KANDI JAYSON HAHN, MD 02/12/2024, 8:41 AM

## 2024-02-12 NOTE — Group Note (Signed)
 Group Topic: Emotional Regulation  Group Date: 02/12/2024 Start Time: 2000 End Time: 2030 Facilitators: Verdon Jacqualyn BRAVO, NT  Department: [facility]  Number of Participants: 6  Group Focus: daily focus Treatment Modality:  Individual Therapy Interventions utilized were group exercise Purpose: express feelings  Name: John [name] Date of Birth: [birth year]  MR: [id number]    Level of Participation: active Quality of Participation: cooperative Interactions with others: gave feedback Mood/Affect: appropriate Triggers (if applicable): n/a Cognition: coherent/clear Progress: Moderate Response: n/a Plan: follow-up needed  Patients Problems:  Patient Active Problem List   Diagnosis Date Noted   Chest pain of uncertain etiology 02/05/2024   Alcohol use disorder, severe, dependence (HCC) 11/06/2023   Alcohol withdrawal (HCC) 08/23/2023   Tachycardia 08/23/2023   Alcohol dependence with uncomplicated withdrawal (HCC) 08/23/2023   MDD (major depressive disorder) 08/05/2023   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

## 2024-02-12 NOTE — ED Notes (Signed)
 Patient refused blood work again this morning stating, the doctor is going to let me go so why do I need that.

## 2024-03-15 ENCOUNTER — Other Ambulatory Visit: Payer: Self-pay

## 2024-03-15 ENCOUNTER — Other Ambulatory Visit (HOSPITAL_COMMUNITY)
Admission: EM | Admit: 2024-03-15 | Discharge: 2024-03-19 | Disposition: A | Discharge status: Home / Self Care | Payer: MEDICAID

## 2024-03-15 ENCOUNTER — Other Ambulatory Visit (HOSPITAL_COMMUNITY)
Admission: EM | Admit: 2024-03-15 | Discharge: 2024-03-15 | Disposition: A | Discharge status: Home / Self Care | Payer: MEDICAID

## 2024-03-15 DIAGNOSIS — F329 Major depressive disorder, single episode, unspecified: Secondary | ICD-10-CM | POA: Insufficient documentation

## 2024-03-15 DIAGNOSIS — F1994 Other psychoactive substance use, unspecified with psychoactive substance-induced mood disorder: Secondary | ICD-10-CM | POA: Insufficient documentation

## 2024-03-15 DIAGNOSIS — F1094 Alcohol use, unspecified with alcohol-induced mood disorder: Secondary | ICD-10-CM

## 2024-03-15 DIAGNOSIS — Z79899 Other long term (current) drug therapy: Secondary | ICD-10-CM | POA: Diagnosis not present

## 2024-03-15 DIAGNOSIS — F1014 Alcohol abuse with alcohol-induced mood disorder: Secondary | ICD-10-CM | POA: Insufficient documentation

## 2024-03-15 DIAGNOSIS — Z5900 Homelessness unspecified: Secondary | ICD-10-CM | POA: Insufficient documentation

## 2024-03-15 DIAGNOSIS — Z91148 Patient's other noncompliance with medication regimen for other reason: Secondary | ICD-10-CM | POA: Insufficient documentation

## 2024-03-15 DIAGNOSIS — F1910 Other psychoactive substance abuse, uncomplicated: Secondary | ICD-10-CM | POA: Diagnosis present

## 2024-03-15 DIAGNOSIS — F1020 Alcohol dependence, uncomplicated: Secondary | ICD-10-CM | POA: Diagnosis not present

## 2024-03-15 LAB — COMPREHENSIVE METABOLIC PANEL WITH GFR
ALT: 23 U/L (ref 0–44)
AST: 31 U/L (ref 15–41)
Albumin: 4.7 g/dL (ref 3.5–5.0)
Alkaline Phosphatase: 91 U/L (ref 38–126)
Anion gap: 11 (ref 5–15)
BUN: 11 mg/dL (ref 6–20)
CO2: 29 mmol/L (ref 22–32)
Calcium: 9.8 mg/dL (ref 8.9–10.3)
Chloride: 98 mmol/L (ref 98–111)
Creatinine, Ser: 0.71 mg/dL (ref 0.61–1.24)
GFR, Estimated: >60 mL/min
Glucose, Bld: 82 mg/dL (ref 70–99)
Potassium: 4.3 mmol/L (ref 3.5–5.1)
Sodium: 138 mmol/L (ref 135–145)
Total Bilirubin: 0.9 mg/dL (ref 0.0–1.2)
Total Protein: 8.2 g/dL — ABNORMAL HIGH (ref 6.5–8.1)

## 2024-03-15 LAB — URINALYSIS, ROUTINE W REFLEX MICROSCOPIC
Bilirubin Urine: NEGATIVE
Glucose, UA: NEGATIVE mg/dL
Hgb urine dipstick: NEGATIVE
Ketones, ur: 20 mg/dL — AB
Leukocytes,Ua: NEGATIVE
Nitrite: NEGATIVE
Protein, ur: NEGATIVE mg/dL
Specific Gravity, Urine: 1.021 (ref 1.005–1.030)
pH: 7 (ref 5.0–8.0)

## 2024-03-15 LAB — LIPID PANEL
Cholesterol: 258 mg/dL — ABNORMAL HIGH (ref 0–200)
HDL: 79 mg/dL
LDL Cholesterol: 165 mg/dL — ABNORMAL HIGH (ref 0–99)
Total CHOL/HDL Ratio: 3.3 ratio
Triglycerides: 70 mg/dL
VLDL: 14 mg/dL (ref 0–40)

## 2024-03-15 LAB — CBC WITH DIFFERENTIAL/PLATELET
Abs Immature Granulocytes: 0.04 K/uL (ref 0.00–0.07)
Basophils Absolute: 0.1 K/uL (ref 0.0–0.1)
Basophils Relative: 1 %
Eosinophils Absolute: 0 K/uL (ref 0.0–0.5)
Eosinophils Relative: 0 %
HCT: 47.6 % (ref 39.0–52.0)
Hemoglobin: 15.8 g/dL (ref 13.0–17.0)
Immature Granulocytes: 1 %
Lymphocytes Relative: 22 %
Lymphs Abs: 1.7 K/uL (ref 0.7–4.0)
MCH: 30.9 pg (ref 26.0–34.0)
MCHC: 33.2 g/dL (ref 30.0–36.0)
MCV: 93.2 fL (ref 80.0–100.0)
Monocytes Absolute: 0.7 K/uL (ref 0.1–1.0)
Monocytes Relative: 9 %
Neutro Abs: 5.3 K/uL (ref 1.7–7.7)
Neutrophils Relative %: 67 %
Platelets: 278 K/uL (ref 150–400)
RBC: 5.11 MIL/uL (ref 4.22–5.81)
RDW: 14.4 % (ref 11.5–15.5)
WBC: 7.9 K/uL (ref 4.0–10.5)
nRBC: 0 % (ref 0.0–0.2)

## 2024-03-15 LAB — POCT URINE DRUG SCREEN - MANUAL ENTRY (I-SCREEN)
POC Amphetamine UR: NOT DETECTED
POC Buprenorphine (BUP): NOT DETECTED
POC Cocaine UR: NOT DETECTED
POC Marijuana UR: POSITIVE — AB
POC Methadone UR: NOT DETECTED
POC Methamphetamine UR: NOT DETECTED
POC Morphine: NOT DETECTED
POC Oxazepam (BZO): NOT DETECTED
POC Oxycodone UR: NOT DETECTED
POC Secobarbital (BAR): NOT DETECTED

## 2024-03-15 LAB — TSH: TSH: 1.45 u[IU]/mL (ref 0.350–4.500)

## 2024-03-15 LAB — HEMOGLOBIN A1C
Hgb A1c MFr Bld: 5.2 % (ref 4.8–5.6)
Mean Plasma Glucose: 102.54 mg/dL

## 2024-03-15 LAB — MAGNESIUM: Magnesium: 2.2 mg/dL (ref 1.7–2.4)

## 2024-03-15 LAB — ETHANOL: Alcohol, Ethyl (B): <15 mg/dL

## 2024-03-15 MED ORDER — ESCITALOPRAM OXALATE 10 MG PO TABS
10.0000 mg | ORAL_TABLET | Freq: Once | ORAL | Status: AC
  Administered 2024-03-15: 10 mg via ORAL
  Filled 2024-03-15: qty 1

## 2024-03-15 MED ORDER — ESCITALOPRAM OXALATE 10 MG PO TABS: 20.0000 mg | ORAL_TABLET | Freq: Once | ORAL | Status: DC

## 2024-03-15 MED ORDER — DIPHENHYDRAMINE HCL 50 MG PO CAPS: 50.0000 mg | ORAL_CAPSULE | Freq: Three times a day (TID) | ORAL | Status: DC | PRN

## 2024-03-15 MED ORDER — LORAZEPAM 2 MG/ML IJ SOLN: 2.0000 mg | Freq: Three times a day (TID) | INTRAMUSCULAR | Status: DC | PRN

## 2024-03-15 MED ORDER — DIPHENHYDRAMINE HCL 50 MG/ML IJ SOLN: 50.0000 mg | Freq: Three times a day (TID) | INTRAMUSCULAR | Status: DC | PRN

## 2024-03-15 MED ORDER — ALUM & MAG HYDROXIDE-SIMETH 200-200-20 MG/5ML PO SUSP: 30.0000 mL | ORAL | Status: DC | PRN

## 2024-03-15 MED ORDER — GABAPENTIN 300 MG PO CAPS
600.0000 mg | ORAL_CAPSULE | Freq: Three times a day (TID) | ORAL | Status: DC
  Administered 2024-03-15 – 2024-03-19 (×12): 600 mg via ORAL
  Filled 2024-03-15 (×12): qty 2

## 2024-03-15 MED ORDER — ACETAMINOPHEN 325 MG PO TABS: 650.0000 mg | ORAL_TABLET | Freq: Four times a day (QID) | ORAL | Status: DC | PRN

## 2024-03-15 MED ORDER — HALOPERIDOL 5 MG PO TABS: 5.0000 mg | ORAL_TABLET | Freq: Three times a day (TID) | ORAL | Status: DC | PRN

## 2024-03-15 MED ORDER — MAGNESIUM HYDROXIDE 400 MG/5ML PO SUSP: 30.0000 mL | Freq: Every day | ORAL | Status: DC | PRN

## 2024-03-15 MED ORDER — TRAZODONE HCL 100 MG PO TABS
100.0000 mg | ORAL_TABLET | Freq: Every day | ORAL | Status: DC
  Administered 2024-03-15 – 2024-03-18 (×4): 100 mg via ORAL
  Filled 2024-03-15 (×4): qty 1

## 2024-03-15 MED ORDER — GABAPENTIN 300 MG PO CAPS: 600.0000 mg | ORAL_CAPSULE | Freq: Three times a day (TID) | ORAL | Status: DC

## 2024-03-15 MED ORDER — HALOPERIDOL LACTATE 5 MG/ML IJ SOLN: 5.0000 mg | Freq: Three times a day (TID) | INTRAMUSCULAR | Status: DC | PRN

## 2024-03-15 MED ORDER — ACETAMINOPHEN 325 MG PO TABS
650.0000 mg | ORAL_TABLET | Freq: Four times a day (QID) | ORAL | Status: DC | PRN
  Administered 2024-03-16 – 2024-03-18 (×2): 650 mg via ORAL
  Filled 2024-03-15 (×2): qty 2

## 2024-03-15 MED ORDER — HALOPERIDOL LACTATE 5 MG/ML IJ SOLN: 10.0000 mg | Freq: Three times a day (TID) | INTRAMUSCULAR | Status: DC | PRN

## 2024-03-15 MED ORDER — NICOTINE 21 MG/24HR TD PT24
21.0000 mg | MEDICATED_PATCH | Freq: Every day | TRANSDERMAL | Status: DC
  Administered 2024-03-15 – 2024-03-19 (×5): 21 mg via TRANSDERMAL
  Filled 2024-03-15 (×5): qty 1

## 2024-03-15 MED ORDER — ESCITALOPRAM OXALATE 10 MG PO TABS: 10.0000 mg | ORAL_TABLET | Freq: Once | ORAL | Status: DC

## 2024-03-15 NOTE — BH Assessment (Addendum)
 Comprehensive Clinical Assessment (CCA) Note  03/15/2024 John [name] [facility] [id number]  Disposition: Per Starlyn Patron, NP admisison to [facility] is recommended.   The patient demonstrates the following risk factors for suicide: Chronic risk factors for suicide include: psychiatric disorder of Substance Induced Mood Disorder, substance use disorder, and demographic factors (male, >46 y/o). Acute risk factors for suicide include: social withdrawal/isolation and loss (financial, interpersonal, professional). Protective factors for this patient include: positive social support and hope for the future. Considering these factors, the overall suicide risk at this point appears to be low. Patient is appropriate for outpatient follow up, once stabilized.    Patient is a 44 year old male with a history of Alcohol Use Disorder, severe and Substance Induced Mood Disorder who presents voluntarily to [facility] for assessment.  Patient presents to [facility] accompanied by a mobile crisis counselor. He is currently experiencing homelessness, after he was recently kicked out of his father's house.  Patient has multiple recent admissions to inpatient facilities since September 2025.  He was recently discharged from [facility].  He reports he relapsed shortly after discharge, stating he was staying in a place where they were using drugs and drinking so I started using drugs and drinking again.  Patient reports he has been drinking multiple Four Lokos daily, with last drink last night.  He has also been using cocaine, however he does not provide use hx or patterns or use.  He has not been compliant with any recommended outpatient treatment and he is not compliant with prescribed medications.  It appears patient is malingering for secondary gain, seeking housing.  He has mentioned going to [employer] from here since I heard they help you with housing.  Patient is denying SI, HI and AVH.    Chief Complaint: No  chief complaint on file.  Visit Diagnosis: Alcohol Use Disorder, severe                            Substance Induced Mood Disorder                                CCA Screening, Triage and Referral (STR)  Patient Reported Information How did you hear about us ? Other (Comment) (Mobile Crisis staff)  What Is the Reason for Your Visit/Call Today? John [name] 43y male presents to [facility] accompanied by a mobile crisis counselor. PT is currently experiencing homelessness. PT is here for detox from alcohol, marijuana and cocaine. Pt says he uses everyday. PT denies SI, HI, and AVH at this time.  How Long Has This Been Causing You Problems? > than 6 months  What Do You Feel Would Help You the Most Today? Alcohol or Drug Use Treatment   Have You Recently Had Any Thoughts About Hurting Yourself? No  Are You Planning to Commit Suicide/Harm Yourself At This time? No   Flowsheet Row ED from 03/15/2024 in [facility] Most recent reading at 03/15/2024 10:23 AM ED from 02/09/2024 in [facility] Most recent reading at 02/11/2024 11:43 AM ED from 02/09/2024 in [facility] Emergency Department [facility] Most recent reading at 02/09/2024  8:16 AM  C-SSRS RISK CATEGORY High Risk No Risk No Risk    Have you Recently Had Thoughts About Hurting Someone Sherral? No  Are You Planning to Harm Someone at This Time? No  Explanation:  N/A   Have You Used Any Alcohol or Drugs in the Past 24 Hours? Yes  How Long Ago Did You Use Drugs or Alcohol? 08/05/23  What Did You Use and How Much? alcohol - four lokos (3-4), cocaine (.5 gram), marijuana (1 blunt)   Do You Currently Have a Therapist/Psychiatrist? No  Name of Therapist/Psychiatrist:    Have You Been Recently Discharged From Any Office Practice or Programs? Yes  Explanation of Discharge From Practice/Program: Recent d/c from [facility] 01/2024     CCA Screening Triage Referral  Assessment Type of Contact: Face-to-Face  Telemedicine Service Delivery:   Is this Initial or Reassessment?   Date Telepsych consult ordered in [facility]:    Time Telepsych consult ordered in [facility]:    Location of Assessment: [facility] Assessment Services  Provider Location: [facility] Assessment Services   Collateral Involvement: None provided   Does Patient Have a Automotive Engineer Guardian? No  Legal Guardian Contact Information: N/A  Copy of Legal Guardianship Form: -- (N/A)  Legal Guardian Notified of Arrival: -- (N/A)  Legal Guardian Notified of Pending Discharge: -- (N/A)  If Minor and Not Living with Parent(s), Who has Custody? N/A  Is CPS involved or ever been involved? Never  Is APS involved or ever been involved? Never   Patient Determined To Be At Risk for Harm To Self or Others Based on Review of Patient Reported Information or Presenting Complaint? No  Method: -- (N/A, no HI)  Availability of Means: -- (N/A, no HI)  Intent: -- (N/A, no HI)  Notification Required: -- (N/A, no HI)  Additional Information for Danger to Others Potential: -- (N/A, no HI)  Additional Comments for Danger to Others Potential: N/A, no HI  Are There Guns or Other Weapons in Your Home? No  Types of Guns/Weapons: N/A  Are These Weapons Safely Secured?                            -- (N/A)  Who Could Verify You Are Able To Have These Secured: N/A  Do You Have any Outstanding Charges, Pending Court Dates, Parole/Probation? None  Contacted To Inform of Risk of Harm To Self or Others: -- (N/A, no HI)    Does Patient Present under Involuntary Commitment? No    [place] of Residence: [place]   Patient Currently Receiving the Following Services: Not Receiving Services   Determination of Need: Urgent (48 hours)   Options For Referral: Facility-Based Crisis     CCA Biopsychosocial Patient Reported Schizophrenia/Schizoaffective Diagnosis in Past: No   Strengths: Patient is  seeking treatment today.   Mental Health Symptoms Depression:  Hopelessness   Duration of Depressive symptoms: Duration of Depressive Symptoms: Greater than two weeks   Mania:  None   Anxiety:   N/A   Psychosis:  None   Duration of Psychotic symptoms:    Trauma:  None   Obsessions:  None   Compulsions:  None   Inattention:  N/A   Hyperactivity/Impulsivity:  N/A   Oppositional/Defiant Behaviors:  N/A   Emotional Irregularity:  None   Other Mood/Personality Symptoms:  NA    Mental Status Exam Appearance and self-care  Stature:  Average   Weight:  Average weight   Clothing:  Casual   Grooming:  Normal   Cosmetic use:  None   Posture/gait:  Normal   Motor activity:  Not Remarkable (Within normal range)   Sensorium  Attention:  Normal  Concentration:  Normal   Orientation:  X5   Recall/memory:  Normal   Affect and Mood  Affect:  Depressed   Mood:  Depressed   Relating  Eye contact:  Normal   Facial expression:  Responsive   Attitude toward examiner:  Cooperative   Thought and Language  Speech flow: Clear and Coherent; Normal   Thought content:  Appropriate to Mood and Circumstances   Preoccupation:  None   Hallucinations:  None   Organization:  Coherent   [employer] of Knowledge:  Fair   Intelligence:  Average   Abstraction:  Normal   Judgement:  Impaired   Reality Testing:  Adequate   Insight:  Gaps   Decision Making:  Impulsive; Vacilates   Social Functioning  Social Maturity:  Self-centered   Social Judgement:  Heedless; Chief Of Staff   Stress  Stressors:  Housing; Transitions   Coping Ability:  Overwhelmed; Deficient supports   Skill Deficits:  None   Supports:  Support needed     Religion: Religion/Spirituality Are You A Religious Person?: No How Might This Affect Treatment?: N/A  Leisure/Recreation: Leisure / Recreation Do You Have Hobbies?: No  Exercise/Diet: Exercise/Diet Do  You Exercise?: No Have You Gained or Lost A Significant Amount of Weight in the Past Six Months?: No Do You Follow a Special Diet?: No Do You Have Any Trouble Sleeping?: No   CCA Employment/Education Employment/Work Situation: Employment / Work Situation Employment Situation: On disability Why is Patient on Disability: due to accident How Long has Patient Been on Disability: 2 years Patient's Job has Been Impacted by Current Illness: No Has Patient ever Been in the [employer]?: No  Education: Education Is Patient Currently Attending School?: No Last Grade Completed: 12 Did You Attend College?: No Did You Have An Individualized Education Program (IIEP): No Did You Have Any Difficulty At School?: No Patient's Education Has Been Impacted by Current Illness: No   CCA Family/Childhood History Family and Relationship History: Family history Does patient have children?: No  Childhood History:  Childhood History By whom was/is the patient raised?: Mother Did patient suffer any verbal/emotional/physical/sexual abuse as a child?: No Has patient ever been sexually abused/assaulted/raped as an adolescent or adult?: No Witnessed domestic violence?: No Has patient been affected by domestic violence as an adult?: No       CCA Substance Use Alcohol/Drug Use: Alcohol / Drug Use Pain Medications: See MAR Prescriptions: See MAR Over the Counter: See MAR History of alcohol / drug use?: Yes Longest period of sobriety (when/how long): Unable to quantify Negative Consequences of Use: Personal relationships, Financial Withdrawal Symptoms: None Substance #1 Name of Substance 1: ETOH 1 - Age of First Use: unknown 1 - Amount (size/oz): varies 1 - Frequency: daily 1 - Duration: several weeks since [facility] d/c 1 - Last Use / Amount: last night - 3-4 Four Lokos 1 - Method of Aquiring: buys 1- Route of Use: drinks                       ASAM's:  Six Dimensions of Multidimensional  Assessment  Dimension 1:  Acute Intoxication and/or Withdrawal Potential:   Dimension 1:  Description of individual's past and current experiences of substance use and withdrawal: Pt is a high risk drinker with risk of DT's with hx of DT's  Dimension 2:  Biomedical Conditions and Complications:   Dimension 2:  Description of patient's biomedical conditions and  complications: Able to cope with physical  discomfort or pain.  Dimension 3:  Emotional, Behavioral, or Cognitive Conditions and Complications:  Dimension 3:  Description of emotional, behavioral, or cognitive conditions and complications: Underlying, untreated depression  Dimension 4:  Readiness to Change:  Dimension 4:  Description of Readiness to Change criteria: Seeking treatment today, also motivated by need for shelter/housing.  Dimension 5:  Relapse, Continued use, or Continued Problem Potential:  Dimension 5:  Relapse, continued use, or continued problem potential critiera description: Limited awareness of MI and SA related issues.  Dimension 6:  Recovery/Living Environment:  Dimension 6:  Recovery/Iiving environment criteria description: Limited support  ASAM Severity Score: ASAM's Severity Rating Score: 8  ASAM Recommended Level of Treatment: ASAM Recommended Level of Treatment: Level III Residential Treatment   Substance use Disorder (SUD) Substance Use Disorder (SUD)  Checklist Symptoms of Substance Use: Continued use despite having a persistent/recurrent physical/psychological problem caused/exacerbated by use, Continued use despite persistent or recurrent social, interpersonal problems, caused or exacerbated by use, Evidence of tolerance, Presence of craving or strong urge to use, Social, occupational, recreational activities given up or reduced due to use, Substance(s) often taken in larger amounts or over longer times than was intended, Persistent desire or unsuccessful efforts to cut down or control use  Recommendations for  Services/Supports/Treatments: Recommendations for Services/Supports/Treatments Recommendations For Services/Supports/Treatments: Detox, Facility Based Crisis  Disposition Recommendation per psychiatric provider: We recommend transfer to [facility]. Admit to [facility].   DSM5 Diagnoses: Patient Active Problem List   Diagnosis Date Noted   GAD (generalized anxiety disorder) 02/12/2024   Chest pain of uncertain etiology 02/05/2024   Alcohol use disorder, severe, dependence (HCC) 11/06/2023   Alcohol withdrawal (HCC) 08/23/2023   Tachycardia 08/23/2023   Alcohol dependence with uncomplicated withdrawal (HCC) 08/23/2023   MDD (major depressive disorder) 08/05/2023   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 Hx urethroplasty for urethral trauma in 2020 04/30/2019   Alcohol abuse with intoxication 04/30/2019   Acute Femoral condyle fracture (HCC) 04/30/2019   Alcohol abuse 01/14/2018   Substance induced mood disorder (HCC) 01/14/2018     Referrals to Alternative Service(s): Referred to Alternative Service(s):   Place:   Date:   Time:    Referred to Alternative Service(s):   Place:   Date:   Time:    Referred to Alternative Service(s):   Place:   Date:   Time:    Referred to Alternative Service(s):   Place:   Date:   Time:     Deland LITTIE Louder, [facility]

## 2024-03-15 NOTE — Group Note (Signed)
 Group Topic: Overcoming Obstacles  Group Date: 03/15/2024 Start Time: 1100 End Time: 1200 Facilitators: Deidre Prentis CROME, NT  Department: [facility]  Number of Participants: 5  Group Focus: community group Treatment Modality:  Psychoeducation Interventions utilized were patient education Purpose: increase insight  Name: John [name] Date of Birth: 1980-10-22  MR: [id number]    Level of Participation: Pt did not attend group.  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

## 2024-03-15 NOTE — Care Management (Signed)
 [facility] Care Management...   Writer met with patient to discuss discharge planning  Patient reported that he wants long term residential treatment   Writer will refer to [facility] 13-18 month faith based  Patient referred to residential placement at [email or web address], clinical research associate will follow up

## 2024-03-15 NOTE — Progress Notes (Signed)
" °   03/15/24 1018  [facility] Triage Screening (Walk-ins at [facility] only)  What Is the Reason for Your Visit/Call Today? John [name] 43y male presents to [facility] accompanied by a mobile crisis counselor. PT is currently experiencing homelessness. PT is here for detox from alcohol, marijuana and cocaine. Pt says he uses everyday. PT denies SI, HI, and AVH at this time.  How Long Has This Been Causing You Problems? > than 6 months  Have You Recently Had Any Thoughts About Hurting Yourself? No  Are You Planning to Commit Suicide/Harm Yourself At This time? No  Have you Recently Had Thoughts About Hurting Someone Sherral? No  Are You Planning To Harm Someone At This Time? No  Physical Abuse Denies  Verbal Abuse Denies  Sexual Abuse Denies  Exploitation of patient/patient's resources Denies  Self-Neglect Denies  Are you currently experiencing any auditory, visual or other hallucinations? No  Have You Used Any Alcohol or Drugs in the Past 24 Hours? Yes  What Did You Use and How Much? alcohol - four lokos (3-4), cocaine (.5 gram), marijuana (1 blunt)  Do you have any current medical co-morbidities that require immediate attention?  (asthma)  Clinician description of patient physical appearance/behavior: talkative, cooperative  What Do You Feel Would Help You the Most Today? Alcohol or Drug Use Treatment  Determination of Need Routine (7 days)  Options For Referral Facility-Based Crisis    "

## 2024-03-15 NOTE — ED Notes (Signed)
 Labs sent one dark green tube, two lavender tubes, one red tube, one light green tube, two yellow tubes, with labels attached and order form.

## 2024-03-15 NOTE — ED Notes (Signed)
 Patient in dayroom watching movie and talking with peers, no complaints, calm and cooperative. Will continue to monitor

## 2024-03-15 NOTE — ED Notes (Addendum)
 Pt admitted to [facility] requesting detox from ETOH and homelessness. Pt states, It's cold as shit out there. I had to come and get warm. I want to go somewhere long term for awhile. Pt denies SI/HI/AVH. Pt states drinking prior to coming in facility. Calm, cooperative throughout interview process. Skin assessment completed. Oriented to unit. Meal and drink offered. Pt verbally contract for safety. Will monitor for safety.

## 2024-03-15 NOTE — ED Provider Notes (Signed)
 Facility Based Crisis Admission H&P  Date: 03/15/24 Patient Name: John [name] MRN: [id number] Chief Complaint: to get detox from alcohol, cocaine and THC  Diagnoses:  Final diagnoses:  Polysubstance abuse [facility])    HPI: John [name] is a 44 year-old male with a hx of of HTN, ASD, Alcohol use disorder, MDD and Bipolar disorder  who presents to [facility] voluntarily, reporting that he wants to detox from alcohol, cocaine and THC.  He reports that he just relapsed after being discharged from [facility] on 02/12/2024.  States he went to stay at a place where people are using drugs and I was doing stupid crap and police was called and pt was pulled out of the property, and became homeless. He went to stay at [facility] but was asked to leave in AM. He reports that he used to stay at [facility] and was also kicked out.  Patient reports he is tired of depending on substances and getting in trouble all the time. He currently has a court date related to financial theft.  Patient denies SI/HI/AVH. States he wants to detox then go to a rehab service like [facility], I heard they help people with housing.... He reports not having outpatient mental health services. Reports not taking his medications regularly. Per chart review, current medications would be : Lexapro , Gabapentin , Trazodone  and Nicotine  patch.   Patient is evaluated face-to-face by this NP who consulted with Dr Lawrnce. Chart is reviewed today 03/15/2024.  43 year-old male who appears disheveled. He is anxious and restless, stating that he is tired of using substances and getting in trouble. He is alert and oriented and denying SI/HI/AVH. Speech is clear and thought process is coherent. Patient is preoccupied by housing issues but also states I want to start over, I want to detox and go to rehab... He reports that he experience difficulty abstaining from alcohol and drug use [name] to the environment he's is surrounded with. States  this time he relapsed because everyone was using, its crazy there.   He reports that he uses about 3 liters of liquor every day. Uses cocaine and THC every day,  amount unknown I smoke a lot. Last use was last night.  Patient left the boarding house yesterday and stayed at [facility] but can not return there.  Patient denies experiencing withdrawal symptoms. No major distress noted.  When discussing about his multiple admissions, with no long term outcome, patient states this time I am ready, tired of living like this.  He denies current medical concerns. He reports that his appetite is good stating I am hungry. Reports sleeping well when he takes Trazodone .   Patient expresses his desire to detox from alcohol, and other substances wishing to go to a rehab services thereafter.  He is accepted  to [facility] by Dr Lawrnce. Treatment process discussed with patient and he verbalized understanding.    PHQ 2-9:  Flowsheet Row ED from 03/15/2024 in [facility] ED from 02/09/2024 in [facility] ED from 08/05/2023 in [facility]  Thoughts that you would be better off dead, or of hurting yourself in some way Not at all Nearly every day Not at all  PHQ-9 Total Score 12 11 7     Flowsheet Row ED from 03/15/2024 in [facility] Most recent reading at 03/15/2024 10:23 AM ED from 02/09/2024 in [facility] Most recent reading at 02/11/2024 11:43  AM ED from 02/09/2024 in [facility] at [facility] Most recent reading at 02/09/2024  8:16 AM  C-SSRS RISK CATEGORY High Risk No Risk No Risk      Total Time spent with patient: 45 minutes  Musculoskeletal  Strength & Muscle Tone: within normal limits Gait & Station: normal Patient leans: N/A  Psychiatric Specialty Exam  Presentation General Appearance:  Disheveled  Eye  Contact: Fair  Speech: Clear and Coherent  Speech Volume: Increased  Handedness: Right   Mood and Affect  Mood: Anxious  Affect: Congruent   Thought Process  Thought Processes: Coherent  Descriptions of Associations:Intact  Orientation:Full (Time, Place and Person)  Thought Content:WDL  Diagnosis of Schizophrenia or Schizoaffective disorder in past: No   Hallucinations:Hallucinations: None  Ideas of Reference:None  Suicidal Thoughts:Suicidal Thoughts: No  Homicidal Thoughts:Homicidal Thoughts: No   Sensorium  Memory: Immediate Fair; Recent Fair; Remote Fair  Judgment: Fair  Insight: Fair   Chartered Certified Accountant: Fair  Attention Span: Fair  Recall: [employer] of Knowledge: Fair  Language: Fair   Psychomotor Activity  Psychomotor Activity: Psychomotor Activity: Restlessness   Assets  Assets: Communication Skills; Desire for Improvement   Sleep  Sleep: Sleep: Fair Number of Hours of Sleep: 7   Nutritional Assessment (For OBS and FBC admissions only) Has the patient had a weight loss or gain of 10 pounds or more in the last 3 months?: No Has the patient had a decrease in food intake/or appetite?: No Does the patient have dental problems?: No Does the patient have eating habits or behaviors that may be indicators of an eating disorder including binging or inducing vomiting?: No Has the patient recently lost weight without trying?: 0 Has the patient been eating poorly because of a decreased appetite?: 0 Malnutrition Screening Tool Score: 0    Physical Exam Vitals reviewed.  Constitutional:      Appearance: Normal appearance.  HENT:     Head: Normocephalic and atraumatic.     Right Ear: Tympanic membrane normal.     Left Ear: Tympanic membrane normal.     Nose: Nose normal.  Eyes:     Extraocular Movements: Extraocular movements intact.     Pupils: Pupils are equal, round, and reactive to light.   Cardiovascular:     Rate and Rhythm: Normal rate.     Pulses: Normal pulses.  Pulmonary:     Effort: Pulmonary effort is normal.  Musculoskeletal:        General: Normal range of motion.     Cervical back: Normal range of motion and neck supple.  Neurological:     General: No focal deficit present.     Mental Status: He is alert and oriented to person, place, and time.  Psychiatric:        Thought Content: Thought content normal.   Review of Systems  Constitutional: Negative.   HENT: Negative.    Eyes: Negative.   Respiratory: Negative.    Cardiovascular: Negative.   Gastrointestinal: Negative.   Genitourinary: Negative.   Musculoskeletal: Negative.   Skin: Negative.   Neurological: Negative.   Endo/Heme/Allergies: Negative.   Psychiatric/Behavioral:  Positive for substance abuse. The patient is nervous/anxious.     Blood pressure 120/76, pulse 91, temperature 98.3 F (36.8 C), temperature source Oral, resp. rate 18, SpO2 97%. There is no height or weight on file to calculate BMI.  Past Psychiatric History: Polysubstance abuse, MDD, Anxiety   Is the patient at risk to  self? No  Has the patient been a risk to self in the past 6 months? No .    Has the patient been a risk to self within the distant past? No   Is the patient a risk to others? No   Has the patient been a risk to others in the past 6 months? No   Has the patient been a risk to others within the distant past? No   Past Medical History: HTN, Chronic back pain Family History: NA Social History: [place]  Last Labs:  Admission on 02/09/2024, Discharged on 02/09/2024  Component Date Value Ref Range Status   Sodium 02/09/2024 140  135 - 145 mmol/L Final   Potassium 02/09/2024 3.8  3.5 - 5.1 mmol/L Final   Chloride 02/09/2024 99  98 - 111 mmol/L Final   CO2 02/09/2024 24  22 - 32 mmol/L Final   Glucose, Bld 02/09/2024 90  70 - 99 mg/dL Final   Glucose reference range applies only to samples taken after  fasting for at least 8 hours.   BUN 02/09/2024 5 (L)  6 - 20 mg/dL Final   Creatinine, Ser 02/09/2024 0.72  0.61 - 1.24 mg/dL Final   Calcium  02/09/2024 9.9  8.9 - 10.3 mg/dL Final   Total Protein 87/83/7974 8.7 (H)  6.5 - 8.1 g/dL Final   Albumin 87/83/7974 4.8  3.5 - 5.0 g/dL Final   AST 87/83/7974 25  15 - 41 U/L Final   ALT 02/09/2024 9  0 - 44 U/L Final   Alkaline Phosphatase 02/09/2024 85  38 - 126 U/L Final   Total Bilirubin 02/09/2024 0.3  0.0 - 1.2 mg/dL Final   GFR, Estimated 02/09/2024 >60  >60 mL/min Final   Comment: (NOTE) Calculated using the CKD-EPI Creatinine Equation (2021)    Anion gap 02/09/2024 16 (H)  5 - 15 Final   Performed at [facility], [address], [place], [place] [address]   Alcohol, Ethyl (B) 02/09/2024 87 (H)  <15 mg/dL Final   Comment: (NOTE) For medical purposes only. Performed at [facility], [address], [place], [place] [address]    WBC 02/09/2024 13.1 (H)  4.0 - 10.5 K/uL Final   RBC 02/09/2024 5.35  4.22 - 5.81 MIL/uL Final   Hemoglobin 02/09/2024 16.1  13.0 - 17.0 g/dL Final   HCT 87/83/7974 48.6  39.0 - 52.0 % Final   MCV 02/09/2024 90.8  80.0 - 100.0 fL Final   MCH 02/09/2024 30.1  26.0 - 34.0 pg Final   MCHC 02/09/2024 33.1  30.0 - 36.0 g/dL Final   RDW 87/83/7974 12.9  11.5 - 15.5 % Final   Platelets 02/09/2024 321  150 - 400 K/uL Final   nRBC 02/09/2024 0.0  0.0 - 0.2 % Final   Performed at [name], [address], [place], [place] [address]   Opiates 02/09/2024 NEGATIVE  NEGATIVE Final   Cocaine 02/09/2024 NEGATIVE  NEGATIVE Final   Benzodiazepines 02/09/2024 NEGATIVE  NEGATIVE Final   Amphetamines 02/09/2024 NEGATIVE  NEGATIVE Final   Tetrahydrocannabinol 02/09/2024 POSITIVE (A)  NEGATIVE Final   Barbiturates 02/09/2024 NEGATIVE  NEGATIVE Final   Methadone Scn, Ur 02/09/2024 NEGATIVE  NEGATIVE Final   Fentanyl  02/09/2024 NEGATIVE   NEGATIVE Final   Comment: (NOTE) Drug screen is for Medical Purposes only. Positive results are preliminary only. If confirmation is needed, notify lab within 5 days.  Drug Class  Cutoff (ng/mL) Amphetamine and metabolites 1000 Barbiturate and metabolites 200 Benzodiazepine              200 Opiates and metabolites     300 Cocaine and metabolites     300 THC                         50 Fentanyl                     5 Methadone                   300  Trazodone  is metabolized in vivo to several metabolites,  including pharmacologically active m-CPP, which is excreted in the  urine.  Immunoassay screens for amphetamines and MDMA have potential  cross-reactivity with these compounds and may provide false positive  result.  Performed at [facility], [address] [place], [place] [address]   Clinical Support on 01/06/2024  Component Date Value Ref Range Status   Hepatitis B Surface Ag 01/06/2024 Non Reactive   Final   Hepatitis B Surface Ag 01/06/2024 Non Reactive   Final   Hep B Core Total Ab 01/06/2024 Non Reactive   Final  Admission on 11/20/2023, Discharged on 11/21/2023  Component Date Value Ref Range Status   Sodium 11/20/2023 139  135 - 145 mmol/L Final   Potassium 11/20/2023 3.4 (L)  3.5 - 5.1 mmol/L Final   Chloride 11/20/2023 105  98 - 111 mmol/L Final   CO2 11/20/2023 22  22 - 32 mmol/L Final   Glucose, Bld 11/20/2023 99  70 - 99 mg/dL Final   Glucose reference range applies only to samples taken after fasting for at least 8 hours.   BUN 11/20/2023 6  6 - 20 mg/dL Final   Creatinine, Ser 11/20/2023 0.65  0.61 - 1.24 mg/dL Final   Calcium  11/20/2023 8.5 (L)  8.9 - 10.3 mg/dL Final   Total Protein 90/73/7974 8.0  6.5 - 8.1 g/dL Final   Albumin 90/73/7974 3.7  3.5 - 5.0 g/dL Final   AST 90/73/7974 24  15 - 41 U/L Final   ALT 11/20/2023 20  0 - 44 U/L Final   Alkaline Phosphatase 11/20/2023 55  38 - 126 U/L Final    Total Bilirubin 11/20/2023 0.4  0.0 - 1.2 mg/dL Final   GFR, Estimated 11/20/2023 >60  >60 mL/min Final   Comment: (NOTE) Calculated using the CKD-EPI Creatinine Equation (2021)    Anion gap 11/20/2023 12  5 - 15 Final   Performed at [facility], [address] [place], [place] [address]   Alcohol, Ethyl (B) 11/20/2023 159 (H)  <15 mg/dL Final   Comment: (NOTE) For medical purposes only. Performed at [facility], [address] [place], [place] [address]    WBC 11/20/2023 7.9  4.0 - 10.5 K/uL Final   RBC 11/20/2023 4.68  4.22 - 5.81 MIL/uL Final   Hemoglobin 11/20/2023 13.9  13.0 - 17.0 g/dL Final   HCT 90/73/7974 42.5  39.0 - 52.0 % Final   MCV 11/20/2023 90.8  80.0 - 100.0 fL Final   MCH 11/20/2023 29.7  26.0 - 34.0 pg Final   MCHC 11/20/2023 32.7  30.0 - 36.0 g/dL Final   RDW 90/73/7974 15.1  11.5 - 15.5 % Final   Platelets 11/20/2023 284  150 - 400 K/uL Final   nRBC 11/20/2023 0.0  0.0 - 0.2 % Final   Neutrophils  Relative % 11/20/2023 57  % Final   Neutro Abs 11/20/2023 4.6  1.7 - 7.7 K/uL Final   Lymphocytes Relative 11/20/2023 34  % Final   Lymphs Abs 11/20/2023 2.7  0.7 - 4.0 K/uL Final   Monocytes Relative 11/20/2023 7  % Final   Monocytes Absolute 11/20/2023 0.5  0.1 - 1.0 K/uL Final   Eosinophils Relative 11/20/2023 1  % Final   Eosinophils Absolute 11/20/2023 0.1  0.0 - 0.5 K/uL Final   Basophils Relative 11/20/2023 1  % Final   Basophils Absolute 11/20/2023 0.1  0.0 - 0.1 K/uL Final   Immature Granulocytes 11/20/2023 0  % Final   Abs Immature Granulocytes 11/20/2023 0.03  0.00 - 0.07 K/uL Final   Performed at [facility], [address] [place], [place] [address]  Admission on 11/12/2023, Discharged on 11/12/2023  Component Date Value Ref Range Status   WBC 11/12/2023 8.7  4.0 - 10.5 K/uL Final   RBC 11/12/2023 4.33  4.22 - 5.81 MIL/uL Final   Hemoglobin 11/12/2023 13.4  13.0 - 17.0 g/dL Final   HCT  90/81/7974 40.4  39.0 - 52.0 % Final   MCV 11/12/2023 93.3  80.0 - 100.0 fL Final   MCH 11/12/2023 30.9  26.0 - 34.0 pg Final   MCHC 11/12/2023 33.2  30.0 - 36.0 g/dL Final   RDW 90/81/7974 14.8  11.5 - 15.5 % Final   Platelets 11/12/2023 389  150 - 400 K/uL Final   nRBC 11/12/2023 0.0  0.0 - 0.2 % Final   Neutrophils Relative % 11/12/2023 55  % Final   Neutro Abs 11/12/2023 5.0  1.7 - 7.7 K/uL Final   Lymphocytes Relative 11/12/2023 33  % Final   Lymphs Abs 11/12/2023 2.8  0.7 - 4.0 K/uL Final   Monocytes Relative 11/12/2023 9  % Final   Monocytes Absolute 11/12/2023 0.7  0.1 - 1.0 K/uL Final   Eosinophils Relative 11/12/2023 1  % Final   Eosinophils Absolute 11/12/2023 0.1  0.0 - 0.5 K/uL Final   Basophils Relative 11/12/2023 1  % Final   Basophils Absolute 11/12/2023 0.1  0.0 - 0.1 K/uL Final   WBC Morphology 11/12/2023 See Note   Final   Mild Left Shift (1-5% metas, occ myelo)   RBC Morphology 11/12/2023 See Note   Final   MIXED RBC POPULATION   Smear Review 11/12/2023 Normal platelet morphology   Final   Immature Granulocytes 11/12/2023 1  % Final   Abs Immature Granulocytes 11/12/2023 0.06  0.00 - 0.07 K/uL Final   Performed at [facility], [address] [place], [place] [address]   Sodium 11/12/2023 140  135 - 145 mmol/L Final   Potassium 11/12/2023 3.3 (L)  3.5 - 5.1 mmol/L Final   Chloride 11/12/2023 107  98 - 111 mmol/L Final   CO2 11/12/2023 24  22 - 32 mmol/L Final   Glucose, Bld 11/12/2023 98  70 - 99 mg/dL Final   Glucose reference range applies only to samples taken after fasting for at least 8 hours.   BUN 11/12/2023 9  6 - 20 mg/dL Final   Creatinine, Ser 11/12/2023 0.70  0.61 - 1.24 mg/dL Final   Calcium  11/12/2023 8.2 (L)  8.9 - 10.3 mg/dL Final   GFR, Estimated 11/12/2023 >60  >60 mL/min Final   Comment: (NOTE) Calculated using the CKD-EPI Creatinine Equation (2021)    Anion gap 11/12/2023 9  5 - 15 Final    Performed at [facility], [address] [place], [place] [address]   Alcohol, Ethyl (B) 11/12/2023 140 (H)  <15 mg/dL Final   Comment: (NOTE) For medical purposes only. Performed at [facility], [address] [place], [place] [address]    Acetaminophen  (Tylenol ), Serum 11/12/2023 <10 (L)  10 - 30 ug/mL Final   Comment: (NOTE) Therapeutic concentrations vary significantly. A range of 10-30 ug/mL  may be an effective concentration for many patients. However, some  are best treated at concentrations outside of this range. Acetaminophen  concentrations >150 ug/mL at 4 hours after ingestion  and >50 ug/mL at 12 hours after ingestion are often associated with  toxic reactions.  Performed at [facility], [address] [place], [place] [address]    Salicylate Lvl 11/12/2023 <7.0 (L)  7.0 - 30.0 mg/dL Final   Performed at [facility], [address] [place], [place] [address]   Troponin I (High Sensitivity) 11/12/2023 4  <18 ng/L Final   Comment: (NOTE) Elevated high sensitivity troponin I (hsTnI) values and significant  changes across serial measurements may suggest ACS but many other  chronic and acute conditions are known to elevate hsTnI results.  Refer to the Links section for chest pain algorithms and additional  guidance. Performed at [facility] [address] [place], [place] [address]    Tricyclic, Ur Screen 11/12/2023 NONE DETECTED  NONE DETECTED Final   Amphetamines, Ur Screen 11/12/2023 NONE DETECTED  NONE DETECTED Final   MDMA (Ecstasy)Ur Screen 11/12/2023 NONE DETECTED  NONE DETECTED Final   Cocaine Metabolite,Ur [place] 11/12/2023 NONE DETECTED  NONE DETECTED Final   Opiate, Ur Screen 11/12/2023 NONE DETECTED  NONE DETECTED Final   Phencyclidine (PCP) Ur S 11/12/2023 NONE DETECTED  NONE DETECTED Final   Cannabinoid 50 Ng, Ur [place] 11/12/2023 POSITIVE (A)  NONE DETECTED Final   Barbiturates, Ur Screen  11/12/2023 NONE DETECTED  NONE DETECTED Final   Benzodiazepine, Ur Scrn 11/12/2023 NONE DETECTED  NONE DETECTED Final   Methadone Scn, Ur 11/12/2023 NONE DETECTED  NONE DETECTED Final   Comment: (NOTE) Tricyclics + metabolites, urine    Cutoff 1000 ng/mL Amphetamines + metabolites, urine  Cutoff 1000 ng/mL MDMA (Ecstasy), urine              Cutoff 500 ng/mL Cocaine Metabolite, urine          Cutoff 300 ng/mL Opiate + metabolites, urine        Cutoff 300 ng/mL Phencyclidine (PCP), urine         Cutoff 25 ng/mL Cannabinoid, urine                 Cutoff 50 ng/mL Barbiturates + metabolites, urine  Cutoff 200 ng/mL Benzodiazepine, urine              Cutoff 200 ng/mL Methadone, urine                   Cutoff 300 ng/mL  The urine drug screen provides only a preliminary, unconfirmed analytical test result and should not be used for non-medical purposes. Clinical consideration and professional judgment should be applied to any positive drug screen result [name] to possible interfering substances. A more specific alternate chemical method must be used in order to obtain a confirmed analytical result. Gas chromatography / mass spectrometry (GC/MS) is the preferred confirm                          atory  method. Performed at [facility], [address] [place], [place] [address]    Color, Urine 11/12/2023 YELLOW (A)  YELLOW Final   APPearance 11/12/2023 CLEAR (A)  CLEAR Final   Specific Gravity, Urine 11/12/2023 1.019  1.005 - 1.030 Final   pH 11/12/2023 5.0  5.0 - 8.0 Final   Glucose, UA 11/12/2023 NEGATIVE  NEGATIVE mg/dL Final   Hgb urine dipstick 11/12/2023 NEGATIVE  NEGATIVE Final   Bilirubin Urine 11/12/2023 NEGATIVE  NEGATIVE Final   Ketones, ur 11/12/2023 NEGATIVE  NEGATIVE mg/dL Final   Protein, ur 90/81/7974 NEGATIVE  NEGATIVE mg/dL Final   Nitrite 90/81/7974 NEGATIVE  NEGATIVE Final   Leukocytes,Ua 11/12/2023 NEGATIVE  NEGATIVE Final   Performed at [facility], [address] [place], [place] [address]   Troponin I (High Sensitivity) 11/12/2023 4  <18 ng/L Final   Comment: (NOTE) Elevated high sensitivity troponin I (hsTnI) values and significant  changes across serial measurements may suggest ACS but many other  chronic and acute conditions are known to elevate hsTnI results.  Refer to the Links section for chest pain algorithms and additional  guidance. Performed at [facility], [address] [place], [place] [address]    B Natriuretic Peptide 11/12/2023 6.2  0.0 - 100.0 pg/mL Final   Performed at [facility], [address] [place], [place] [address]  Admission on 11/06/2023, Discharged on 11/10/2023  Component Date Value Ref Range Status   SARS Coronavirus 2 by RT PCR 11/09/2023 NEGATIVE  NEGATIVE Final   Comment: (NOTE) SARS-CoV-2 target nucleic acids are NOT DETECTED.  The SARS-CoV-2 RNA is generally detectable in upper respiratory specimens during the acute phase of infection. The lowest concentration of SARS-CoV-2 viral copies this assay can detect is 138 copies/mL. A negative result does not preclude SARS-Cov-2 infection and should not be used as the sole basis for treatment or other patient management decisions. A negative result may occur with  improper specimen collection/handling, submission of specimen other than nasopharyngeal swab, presence of viral mutation(s) within the areas targeted by this assay, and inadequate number of viral copies(<138 copies/mL). A negative result must be combined with clinical observations, patient history, and epidemiological information. The expected result is Negative.  Fact Sheet for Patients:  bloggercourse.com  Fact Sheet for Healthcare Providers:  seriousbroker.it  This test is no                          t yet approved or cleared by the [place]  FDA and  has been authorized for  detection and/or diagnosis of SARS-CoV-2 by FDA under an Emergency Use Authorization (EUA). This EUA will remain  in effect (meaning this test can be used) for the duration of the COVID-19 declaration under Section 564(b)(1) of the Act, 21 U.S.C.section 360bbb-3(b)(1), unless the authorization is terminated  or revoked sooner.       Influenza A by PCR 11/09/2023 NEGATIVE  NEGATIVE Final   Influenza B by PCR 11/09/2023 NEGATIVE  NEGATIVE Final   Comment: (NOTE) The Xpert Xpress SARS-CoV-2/FLU/RSV plus assay is intended as an aid in the diagnosis of influenza from Nasopharyngeal swab specimens and should not be used as a sole basis for treatment. Nasal washings and aspirates are unacceptable for Xpert Xpress SARS-CoV-2/FLU/RSV testing.  Fact Sheet for Patients: bloggercourse.com  Fact Sheet for Healthcare Providers: seriousbroker.it  This test is not yet approved or cleared by the [place]  FDA and has been authorized for  detection and/or diagnosis of SARS-CoV-2 by FDA under an Emergency Use Authorization (EUA). This EUA will remain in effect (meaning this test can be used) for the duration of the COVID-19 declaration under Section 564(b)(1) of the Act, 21 U.S.C. section 360bbb-3(b)(1), unless the authorization is terminated or revoked.     Resp Syncytial Virus by PCR 11/09/2023 NEGATIVE  NEGATIVE Final   Comment: (NOTE) Fact Sheet for Patients: bloggercourse.com  Fact Sheet for Healthcare Providers: seriousbroker.it  This test is not yet approved or cleared by the [place]  FDA and has been authorized for detection and/or diagnosis of SARS-CoV-2 by FDA under an Emergency Use Authorization (EUA). This EUA will remain in effect (meaning this test can be used) for the duration of the COVID-19 declaration under Section 564(b)(1) of the Act, 21 U.S.C. section  360bbb-3(b)(1), unless the authorization is terminated or revoked.  Performed at [facility], [address] [place], [place] [address]   Admission on 11/05/2023, Discharged on 11/06/2023  Component Date Value Ref Range Status   Sodium 11/05/2023 139  135 - 145 mmol/L Final   Potassium 11/05/2023 3.8  3.5 - 5.1 mmol/L Final   Chloride 11/05/2023 101  98 - 111 mmol/L Final   CO2 11/05/2023 27  22 - 32 mmol/L Final   Glucose, Bld 11/05/2023 79  70 - 99 mg/dL Final   Glucose reference range applies only to samples taken after fasting for at least 8 hours.   BUN 11/05/2023 <5 (L)  6 - 20 mg/dL Final   Creatinine, Ser 11/05/2023 0.59 (L)  0.61 - 1.24 mg/dL Final   Calcium  11/05/2023 9.7  8.9 - 10.3 mg/dL Final   Total Protein 90/88/7974 9.0 (H)  6.5 - 8.1 g/dL Final   Albumin 90/88/7974 3.8  3.5 - 5.0 g/dL Final   AST 90/88/7974 24  15 - 41 U/L Final   ALT 11/05/2023 11  0 - 44 U/L Final   Alkaline Phosphatase 11/05/2023 89  38 - 126 U/L Final   Total Bilirubin 11/05/2023 0.3  0.0 - 1.2 mg/dL Final   GFR, Estimated 11/05/2023 >60  >60 mL/min Final   Comment: (NOTE) Calculated using the CKD-EPI Creatinine Equation (2021)    Anion gap 11/05/2023 12  5 - 15 Final   Performed at [facility], [address], [place], [place] [address]   Alcohol, Ethyl (B) 11/05/2023 192 (H)  <15 mg/dL Final   Comment: (NOTE) For medical purposes only. Performed at [facility], [address], [place], [place] [address]    Opiates 11/05/2023 NEGATIVE  NEGATIVE Final   Cocaine 11/05/2023 NEGATIVE  NEGATIVE Final   Benzodiazepines 11/05/2023 NEGATIVE  NEGATIVE Final   Amphetamines 11/05/2023 NEGATIVE  NEGATIVE Final   Tetrahydrocannabinol 11/05/2023 NEGATIVE  NEGATIVE Final   Barbiturates 11/05/2023 NEGATIVE  NEGATIVE Final   Methadone Scn, Ur 11/05/2023 NEGATIVE  NEGATIVE Final   Fentanyl  11/05/2023 NEGATIVE  NEGATIVE  Final   Comment: (NOTE) Drug screen is for Medical Purposes only. Positive results are preliminary only. If confirmation is needed, notify lab within 5 days.  Drug Class                 Cutoff (ng/mL) Amphetamine and metabolites 1000 Barbiturate and metabolites 200 Benzodiazepine              200 Opiates and metabolites     300 Cocaine and metabolites     300 THC  50 Fentanyl                     5 Methadone                   300  Trazodone  is metabolized in vivo to several metabolites,  including pharmacologically active m-CPP, which is excreted in the  urine.  Immunoassay screens for amphetamines and MDMA have potential  cross-reactivity with these compounds and may provide false positive  result.  Performed at [facility], [address], [place], [place] [address]    WBC 11/05/2023 12.5 (H)  4.0 - 10.5 K/uL Final   RBC 11/05/2023 4.90  4.22 - 5.81 MIL/uL Final   Hemoglobin 11/05/2023 14.2  13.0 - 17.0 g/dL Final   HCT 90/88/7974 45.6  39.0 - 52.0 % Final   MCV 11/05/2023 93.1  80.0 - 100.0 fL Final   MCH 11/05/2023 29.0  26.0 - 34.0 pg Final   MCHC 11/05/2023 31.1  30.0 - 36.0 g/dL Final   RDW 90/88/7974 14.5  11.5 - 15.5 % Final   Platelets 11/05/2023 464 (H)  150 - 400 K/uL Final   nRBC 11/05/2023 0.0  0.0 - 0.2 % Final   Neutrophils Relative % 11/05/2023 69  % Final   Neutro Abs 11/05/2023 8.6 (H)  1.7 - 7.7 K/uL Final   Lymphocytes Relative 11/05/2023 23  % Final   Lymphs Abs 11/05/2023 2.8  0.7 - 4.0 K/uL Final   Monocytes Relative 11/05/2023 6  % Final   Monocytes Absolute 11/05/2023 0.8  0.1 - 1.0 K/uL Final   Eosinophils Relative 11/05/2023 0  % Final   Eosinophils Absolute 11/05/2023 0.1  0.0 - 0.5 K/uL Final   Basophils Relative 11/05/2023 1  % Final   Basophils Absolute 11/05/2023 0.1  0.0 - 0.1 K/uL Final   Immature Granulocytes 11/05/2023 1  % Final   Abs Immature Granulocytes 11/05/2023  0.11 (H)  0.00 - 0.07 K/uL Final   Performed at [facility], [address] [place], [place] [address]   Acetaminophen  (Tylenol ), Serum 11/05/2023 <10 (L)  10 - 30 ug/mL Final   Comment: (NOTE) Toxic concentrations can be more effectively related to post dose interval; > 200, > 100, and > 50 ug/mL serum concentrations correspond to toxic concentrations at 4, 8, and 12 hours post dose, respectively.  Performed at [facility], [address], [place], [place] [address]    Salicylate Lvl 11/05/2023 <7.0 (L)  7.0 - 30.0 mg/dL Final   Performed at [facility], [address] [place], [place] [address]   Troponin T High Sensitivity 11/05/2023 <15  0 - 19 ng/L Final   Comment: (NOTE) Biotin concentrations > 1000 ng/mL falsely decrease TnT results.  Serial cardiac troponin measurements are suggested.  Refer to the Links section for chest pain algorithms and additional  guidance. Performed at [facility], [address] [place], [place] [address]     Allergies: Penicillins  Medications:  Facility Ordered Medications  Medication   acetaminophen  (TYLENOL ) tablet 650 mg   alum & mag hydroxide-simeth (MAALOX/MYLANTA) 200-200-20 MG/5ML suspension 30 mL   magnesium  hydroxide (MILK OF MAGNESIA) suspension 30 mL   haloperidol  (HALDOL ) tablet 5 mg   And   diphenhydrAMINE  (BENADRYL ) capsule 50 mg   haloperidol  lactate (HALDOL ) injection 5 mg   And   diphenhydrAMINE  (BENADRYL ) injection 50 mg   And   LORazepam  (ATIVAN ) injection 2  mg   haloperidol  lactate (HALDOL ) injection 10 mg   And   diphenhydrAMINE  (BENADRYL ) injection 50 mg   And   LORazepam  (ATIVAN ) injection 2 mg   PTA Medications  Medication Sig   traZODone  (DESYREL ) 100 MG tablet Take 1 tablet (100 mg total) by mouth at bedtime as needed for sleep. (Patient not taking: Reported on 02/09/2024)   naltrexone  (DEPADE) 50 MG tablet  Take 1 tablet (50 mg total) by mouth daily. (Patient not taking: Reported on 02/09/2024)   albuterol  (VENTOLIN  HFA) 108 (90 Base) MCG/ACT inhaler Inhale 2 puffs into the lungs every 6 (six) hours as needed for wheezing or shortness of breath.   doxycycline  (VIBRA -TABS) 100 MG tablet Take 1 tablet (100 mg total) by mouth every 12 (twelve) hours.   escitalopram  (LEXAPRO ) 10 MG tablet Take 1 tablet (10 mg total) by mouth daily.   nicotine  (NICODERM CQ  - DOSED IN MG/24 HOURS) 21 mg/24hr patch Place 1 patch (21 mg total) onto the skin daily.   gabapentin  (NEURONTIN ) 600 MG tablet Take 1 tablet (600 mg total) by mouth 3 (three) times daily.    Long Term Goals: Improvement in symptoms so as ready for discharge  Short Term Goals: Patient will verbalize feelings in meetings with treatment team members., Patient will attend at least of 50% of the groups daily., Pt will complete the PHQ9 on admission, day 3 and discharge., Patient will participate in completing the [place] Suicide Severity Rating Scale, Patient will score a low risk of violence for 24 hours prior to discharge, and Patient will take medications as prescribed daily.  Medical Decision Making  Admission to [facility] for treatment Agitation protocol CIWA protocol EKG Acetaminophen  650 mg PO Q 6 PRN Maalox 30 ml PO Q 4 PRN Milk of Magnesia 30 ml PO Daily PRN Lexapro  10 MG PO Daily Trazodone  100 mg PO HS Gabapentin  600 mg PO TID    Labs: CBC, CMP, A1C, TSH, UA, UDS, Ethanol, Hepatic function panel, Lipid panel, Magnesium   Recommendations  Based on my evaluation the patient does not appear to have an emergency medical condition.  Randall Bouquet, NP 03/15/24  12:07 PM

## 2024-03-15 NOTE — Group Note (Signed)
 Group Topic: Communication  Group Date: 03/15/2024 Start Time: 1705 End Time: 1730 Facilitators: Herold Lajuana NOVAK, RN  Department: [facility]  Number of Participants: 6  Group Focus: coping skills Treatment Modality:  Individual Therapy Interventions utilized were leisure development Purpose: increase insight  Name: John [name] Date of Birth: 1980-08-01  MR: [id number]    Level of Participation: active Quality of Participation: cooperative Interactions with others: gave feedback Mood/Affect: appropriate Triggers (if applicable): none identified Cognition: coherent/clear Progress: Gaining insight Response: I want to go residential once I leave here. 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

## 2024-03-15 NOTE — ED Notes (Signed)
 Pt sitting in dayroom watching television and interacting with peers. No acute distress noted. No concerns voiced. Informed pt to notify staff with any needs or assistance. Pt verbalized understanding and agreement. Will continue to monitor for safety.

## 2024-03-15 NOTE — Progress Notes (Signed)
Pt transferred to [facility]. 

## 2024-03-16 DIAGNOSIS — F1994 Other psychoactive substance use, unspecified with psychoactive substance-induced mood disorder: Secondary | ICD-10-CM | POA: Diagnosis not present

## 2024-03-16 DIAGNOSIS — F1014 Alcohol abuse with alcohol-induced mood disorder: Secondary | ICD-10-CM | POA: Diagnosis not present

## 2024-03-16 DIAGNOSIS — Z5900 Homelessness unspecified: Secondary | ICD-10-CM | POA: Diagnosis not present

## 2024-03-16 DIAGNOSIS — F329 Major depressive disorder, single episode, unspecified: Secondary | ICD-10-CM | POA: Diagnosis not present

## 2024-03-16 MED ORDER — ALBUTEROL SULFATE HFA 108 (90 BASE) MCG/ACT IN AERS: 2.0000 | INHALATION_SPRAY | Freq: Four times a day (QID) | RESPIRATORY_TRACT | Status: DC | PRN

## 2024-03-16 MED ORDER — ESCITALOPRAM OXALATE 10 MG PO TABS
10.0000 mg | ORAL_TABLET | Freq: Every day | ORAL | Status: DC
  Administered 2024-03-17 – 2024-03-19 (×3): 10 mg via ORAL
  Filled 2024-03-16 (×3): qty 1

## 2024-03-16 NOTE — ED Notes (Signed)
 Pt sitting in dayroom watching television. No acute distress noted. No concerns voiced. Informed pt to notify staff with any needs or assistance. Pt verbalized understanding and agreement. Will continue to monitor for safety.

## 2024-03-16 NOTE — ED Notes (Signed)
 Pt sitting in dayroom watching television and interacting with peers. No acute distress noted. No concerns voiced. Informed pt to notify staff with any needs or assistance. Pt verbalized understanding and agreement. Will continue to monitor for safety.

## 2024-03-16 NOTE — Group Note (Signed)
 Group Topic: Healthy Self Image and Positive Change  Group Date: 03/16/2024 Start Time: 1230 End Time: 1330 Facilitators: Alyse Leilani LABOR, NT  Department: [facility]  Number of Participants: 5  Group Focus: anger management, anxiety, coping skills, and daily focus Treatment Modality:  Psychoeducation Interventions utilized were group exercise, patient education, and problem solving Purpose: enhance coping skills, improve communication skills, and regain self-worth  Name: John [name] Date of Birth: 1980/05/15  MR: [id number]    Level of Participation: minimal Quality of Participation: side talking Interactions with others: kept walking in and out Mood/Affect: bored Triggers (if applicable): none Cognition: no insight and not focused Progress: None Response: Listened Plan: patient will be encouraged to Keep coming to group  Patients Problems:  Patient Active Problem List   Diagnosis Date Noted   Polysubstance abuse (HCC) 03/15/2024   GAD (generalized anxiety disorder) 02/12/2024   Chest pain of uncertain etiology 02/05/2024   Alcohol use disorder, severe, dependence (HCC) 11/06/2023   Alcohol withdrawal (HCC) 08/23/2023   Tachycardia 08/23/2023   Alcohol dependence with uncomplicated withdrawal (HCC) 08/23/2023   MDD (major depressive disorder) 08/05/2023   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

## 2024-03-16 NOTE — Care Plan (Signed)
 Interdisciplinary Treatment and Diagnostic Plan Update  03/16/2024 Time of Session: 2PM John [name] MRN: [id number]  Diagnosis:  Final diagnoses:  None     Current Medications:  Current Facility-Administered Medications  Medication Dose Route Frequency Provider Last Rate Last Admin   acetaminophen  (TYLENOL ) tablet 650 mg  650 mg Oral Q6H PRN Randall Starlyn HERO, NP       alum & mag hydroxide-simeth (MAALOX/MYLANTA) 200-200-20 MG/5ML suspension 30 mL  30 mL Oral Q4H PRN Randall, Veronique M, NP       haloperidol  (HALDOL ) tablet 5 mg  5 mg Oral TID PRN Randall Starlyn HERO, NP       And   diphenhydrAMINE  (BENADRYL ) capsule 50 mg  50 mg Oral TID PRN Randall Starlyn HERO, NP       haloperidol  lactate (HALDOL ) injection 5 mg  5 mg Intramuscular TID PRN Randall Starlyn HERO, NP       And   diphenhydrAMINE  (BENADRYL ) injection 50 mg  50 mg Intramuscular TID PRN Randall Starlyn HERO, NP       And   LORazepam  (ATIVAN ) injection 2 mg  2 mg Intramuscular TID PRN Randall Starlyn HERO, NP       haloperidol  lactate (HALDOL ) injection 10 mg  10 mg Intramuscular TID PRN Randall Starlyn HERO, NP       And   diphenhydrAMINE  (BENADRYL ) injection 50 mg  50 mg Intramuscular TID PRN Randall Starlyn HERO, NP       And   LORazepam  (ATIVAN ) injection 2 mg  2 mg Intramuscular TID PRN Randall Starlyn HERO, NP       gabapentin  (NEURONTIN ) capsule 600 mg  600 mg Oral TID Lawrnce, Rhoda J, MD   600 mg at 03/16/24 1622   magnesium  hydroxide (MILK OF MAGNESIA) suspension 30 mL  30 mL Oral Daily PRN Randall Starlyn HERO, NP       nicotine  (NICODERM CQ  - dosed in mg/24 hours) patch 21 mg  21 mg Transdermal Daily Byungura, Veronique M, NP   21 mg at 03/16/24 1011   traZODone  (DESYREL ) tablet 100 mg  100 mg Oral QHS Randall Starlyn HERO, NP   100 mg at 03/15/24 2103   Current Outpatient Medications  Medication Sig Dispense Refill   albuterol  (VENTOLIN  HFA) 108 (90 Base) MCG/ACT inhaler Inhale 2  puffs into the lungs every 6 (six) hours as needed for wheezing or shortness of breath. 8 g 2   escitalopram  (LEXAPRO ) 10 MG tablet Take 1 tablet (10 mg total) by mouth daily. 30 tablet 0   gabapentin  (NEURONTIN ) 600 MG tablet Take 1 tablet (600 mg total) by mouth 3 (three) times daily. (Patient taking differently: Take 600 mg by mouth 3 (three) times daily as needed (For anxiety).) 90 tablet 0   nicotine  (NICODERM CQ  - DOSED IN MG/24 HOURS) 21 mg/24hr patch Place 1 patch (21 mg total) onto the skin daily.     traZODone  (DESYREL ) 100 MG tablet Take 1 tablet (100 mg total) by mouth at bedtime as needed for sleep. 14 tablet 0   PTA Medications: Prior to Admission medications  Medication Sig Start Date End Date Taking? Authorizing Provider  albuterol  (VENTOLIN  HFA) 108 (90 Base) MCG/ACT inhaler Inhale 2 puffs into the lungs every 6 (six) hours as needed for wheezing or shortness of breath. 11/12/23  Yes Nicholaus Rolland BRAVO, MD  escitalopram  (LEXAPRO ) 10 MG tablet Take 1 tablet (10 mg total) by mouth daily. 02/12/24  Yes Cole Kandi BROCKS, MD  gabapentin  (NEURONTIN )  600 MG tablet Take 1 tablet (600 mg total) by mouth 3 (three) times daily. Patient taking differently: Take 600 mg by mouth 3 (three) times daily as needed (For anxiety). 02/12/24  Yes Cole Kandi BROCKS, MD  nicotine  (NICODERM CQ  - DOSED IN MG/24 HOURS) 21 mg/24hr patch Place 1 patch (21 mg total) onto the skin daily. 02/12/24  Yes Cole Kandi BROCKS, MD  traZODone  (DESYREL ) 100 MG tablet Take 1 tablet (100 mg total) by mouth at bedtime as needed for sleep. 11/10/23  Yes Blair Chiquita DEL, NP    Patient Stressors: Medication change or noncompliance   Substance abuse    Patient Strengths: Active sense of humor  Motivation for treatment/growth   Treatment Modalities: Medication Management, Group therapy, Case management,  1 to 1 session with clinician, Psychoeducation, Recreational therapy.   Physician Treatment Plan for Primary and  Secondary Diagnosis:  Final diagnoses:  None   Long Term Goal(s):    Short Term Goals:    Medication Management: Evaluate patient's response, side effects, and tolerance of medication regimen.  Therapeutic Interventions: 1 to 1 sessions, Unit Group sessions and Medication administration.  Evaluation of Outcomes: Progressing  LCSW Treatment Plan for Primary Diagnosis:  Final diagnoses:  None    Long Term Goal(s): Safe transition to appropriate next level of care at discharge.  Short Term Goals: Facilitate acceptance of mental health diagnosis and concerns through verbal commitment to aftercare plan and appointments at discharge. and Identify minimum of 2 triggers associated with mental health/substance abuse issues with treatment team members.  Therapeutic Interventions: Assess for all discharge needs, 1 to 1 time with Child psychotherapist, Explore available resources and support systems, Assess for adequacy in community support network, Educate family and significant other(s) on suicide prevention, Complete Psychosocial Assessment, Interpersonal group therapy.  Evaluation of Outcomes: Progressing   Progress in Treatment: Attending groups: Yes. Participating in groups: Yes. Taking medication as prescribed: Yes. Toleration medication: No. Family/Significant other contact made: No, will contact:  Client reports no contact with family Patient understands diagnosis: Yes. Discussing patient identified problems/goals with staff: Yes. Medical problems stabilized or resolved: Yes. Denies suicidal/homicidal ideation: Yes. Issues/concerns per patient self-inventory: No. Other: Client reports no SI/HI and or plans.  Client reports he came to [facility] for services and to get out of the cold weather.   New problem(s) identified: No, Describe:  NA  New Short Term/Long Term Goal(s):Client will continue to communicate with peer support specialist to help secure long term housing options.  Client will  take all medications as prescribed.  Client will attend group sessions to gain coping skills and decrease symptoms/sobriety.    Patient Goals:  Client will complete his detox at [facility] by 03/19/24.    Discharge Plan or Barriers: Client reports no barriers or issues with discharge plan.  Client will either discharge to a shelter or long term facility for treatment.  Client has also been given options for [facility].   Reason for Continuation of Hospitalization: Anxiety Medication stabilization Withdrawal symptoms  Estimated Length of Stay:03/15/24 - 03/19/24  Last 3 [place] Suicide Severity Risk Score: Flowsheet Row ED from 03/15/2024 in [facility] Most recent reading at 03/15/2024  1:31 PM ED from 03/15/2024 in [facility] Most recent reading at 03/15/2024 10:23 AM ED from 02/09/2024 in [facility] Most recent reading at 02/11/2024 11:43 AM  C-SSRS RISK CATEGORY No Risk High Risk No Risk    Last PHQ 2/9 Scores:  03/16/2024    4:14 PM 03/15/2024   11:32 AM 02/12/2024   10:02 AM  Depression screen PHQ 2/9  Decreased Interest 0 2 3  Down, Depressed, Hopeless 0 2 3  PHQ - 2 Score 0 4 6  Altered sleeping 1 0 0  Tired, decreased energy 1 1 0  Change in appetite 0 2 0  Feeling bad or failure about yourself  2 2 1   Trouble concentrating 1 2 1   Moving slowly or fidgety/restless 1 1 0  Suicidal thoughts 1 0 3  PHQ-9 Score 7 12 11   Difficult doing work/chores Somewhat difficult Very difficult     Scribe for Treatment Team: Avalina Benko, LCSW 03/16/2024 4:28 PM

## 2024-03-16 NOTE — ED Notes (Addendum)
 Patient is alert and oriented. Calm and cooperative. Patient participated in group therapy this afternoon. Patent denies SI/HI. Patient states he wants to go to long a term program for detox.Patient has no complaints. Will continue to monitor for safety

## 2024-03-16 NOTE — Group Note (Signed)
 Group Topic: Communication  Group Date: 03/16/2024 Start Time: 1200 End Time: 1230 Facilitators: Herold Lajuana NOVAK, RN  Department: [facility]  Number of Participants: 5  Group Focus: coping skills Treatment Modality:  Individual Therapy Interventions utilized were leisure development Purpose: enhance coping skills  Name: John [name] Date of Birth: [birth year]  MR: [id number]    Level of Participation: active Quality of Participation: cooperative Interactions with others: gave feedback Mood/Affect: appropriate Triggers (if applicable): none identified Cognition: coherent/clear Progress: Minimal Response: I like to listen to music. 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

## 2024-03-16 NOTE — ED Notes (Signed)
 Pt received in the day room seated with other patients, interacting appropriately.  No complains for distress or discomfort.  Affect bright interacting appropriately.  Safety maintained.

## 2024-03-16 NOTE — ED Notes (Signed)
 Patient resting in bed with eyes closed, respirations even and unlabored, no distress noted, will continue to monitor

## 2024-03-16 NOTE — Group Note (Signed)
 Group Topic: Fears and Unhealthy Coping Skills  Group Date: 03/16/2024 Start Time: 1100 End Time: 1200 Facilitators: Riordan Walle, LCSW  Department: [facility]  Number of Participants: 4  Group Focus: abuse issues, anxiety, coping skills, and substance abuse education Treatment Modality:  Cognitive Behavioral Therapy Interventions utilized were group exercise Purpose: enhance coping skills, express feelings, express irrational fears, relapse prevention strategies, and trigger / craving management  Writer explored with the group members a CBT activity called challenging anxious thoughts.  Group members explored irrational thoughts & behaviors associated with substance use/mental health.  Writer provided positive supportive feedback as each group member shared what their anxious about.  Writer challenged each group member to explore the outcomes of : worst that can happen, best that can happen, and most likely outcome.   Group members and Clinical Research Associate provided coping skills and how to challenge these irrational fears.  Name: John [name] Date of Birth: [birth year]  MR: [id number]    Level of Participation: active Quality of Participation: cooperative, engaged, motivated, and offered feedback Interactions with others: gave feedback Mood/Affect: anxious, appropriate, and brightens with interaction Triggers (if applicable): Client reports being other users and being alone are triggers.  Cognition: coherent/clear, goal directed, and logical Progress: Moderate Response: Client was open and talkative throughout group. 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

## 2024-03-16 NOTE — Group Note (Signed)
 Group Topic: Positive Affirmations  Group Date: 03/15/2024 Start Time: 2000 End Time: 2030 Facilitators: Joshua Ellouise CROME  Department: [facility]  Number of Participants: 5  Group Focus: check in Treatment Modality:  Leisure Development Interventions utilized were group exercise Purpose: reinforce self-care  Name: John [name] Date of Birth: 27-Oct-1980  MR: [id number]    Level of Participation: active Quality of Participation: engaged Interactions with others: gave feedback Mood/Affect: appropriate Triggers (if applicable):  Cognition: goal directed Progress: Gaining insight Response: Pt wants to work on hi anger issues. Plan: patient will be encouraged to follow up on goals.  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

## 2024-03-16 NOTE — Group Note (Signed)
 Group Topic: Relapse and Recovery  Group Date: 03/16/2024 Start Time: 2030 End Time: 2100 Facilitators: Verdon Jacqualyn BRAVO, NT  Department: [facility]  Number of Participants: 6  Group Focus: chemical dependency education Treatment Modality:  Psychodynamic Psychotherapy Interventions utilized were group exercise Purpose: relapse prevention strategies  Name: John [name] Date of Birth: 1981-02-09  MR: [id number]    Level of Participation: active Quality of Participation: cooperative Interactions with others: gave feedback Mood/Affect: appropriate Triggers (if applicable): n/a Cognition: coherent/clear Progress: Moderate Response: n/a Plan: follow-up needed  Patients Problems:  Patient Active Problem List   Diagnosis Date Noted   Polysubstance abuse (HCC) 03/15/2024   GAD (generalized anxiety disorder) 02/12/2024   Chest pain of uncertain etiology 02/05/2024   Alcohol use disorder, severe, dependence (HCC) 11/06/2023   Alcohol withdrawal (HCC) 08/23/2023   Tachycardia 08/23/2023   Alcohol dependence with uncomplicated withdrawal (HCC) 08/23/2023   MDD (major depressive disorder) 08/05/2023   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

## 2024-03-16 NOTE — Care Management (Addendum)
 [facility] Care Management...   Addendum 2:29 pm  Writer met with patient  Patient reported speaking with Tom at [facility]. Patient reported he was advised that he can not return to their program. Patient reported making several calls to [facility] and was advised to call back later in week to complete phone interview.  Patient reported speaking to Bri (PSS) and she will try to stop by this evening or tomorrow morning  More follow up needed   Addendum 11:15 am  Writer spoke with Charlena Matter with [facility] coordinated a phone interview for 1:00 pm.   Writer provided patient with contact number   Writer will follow up with [facility] to check availability and coordinate phone Production Manager provided patient with listing of vacant [place] [facility] in [facility] provided patient with contact number for Bri (PSS)  Writer will reach out to [facility] to inquire of residential program...    [facility] [address] [place], [place] [address] Call: [phone] Email: [email or web address]  Writer left HIPAA compliant message for return call

## 2024-03-16 NOTE — ED Notes (Signed)
 Patient A&Ox4. Denies intent to harm self/others when asked. Denies A/VH. Patient denies any physical complaints when asked. No acute distress noted. Support and encouragement provided. Routine safety checks conducted according to facility protocol. Encouraged patient to notify staff if thoughts of harm toward self or others arise. Patient verbalize understanding and agreement. Will continue to monitor for safety.

## 2024-03-16 NOTE — ED Notes (Signed)
 Patients B/P is 100/56, no provider available at this time. Will report to day shift RN to notify provider

## 2024-03-16 NOTE — ED Provider Notes (Signed)
 Behavioral Health Progress Note  Date and Time: 03/16/2024 6:30 PM Name: John [name] MRN:  [id number]  Subjective: Patient states I would like to go to residential treatment but I have court on 02/04 and I have heard that my case may be dismissed so I would really want to be there.  John [name] is a 44 year old male history including alcohol use disorder, MDD and GAD.  Patient presented voluntarily to [facility] behavioral health on 03/15/2024 request detox from substance including alcohol, cocaine and cannabis.  Patient endorses recent relapse states I have spent $400 over the last week doing bad things (alcohol, cocaine, cannabis) and I am ready to stop it.  Patient making phone calls today as he is seeking an opening at a sober living [facility].    Chart reviewed and patient discussed with attending psychiatrist, Dr. Lawrnce, on 03/16/2024. Patient evaluated by this nurse practitioner face-to-face.  Patient engaged in AA meeting upon my approach.  Patient reports I feel anxious when I go to AA meetings because I think people may judge me when I say things.   Patient is seated, no apparent distress.  He is alert and oriented, pleasant and cooperative during assessment.  Patient presents with anxious mood, congruent affect.  Octavia endorses average appetite.  Reports average energy level.  He is actively engaged with staff and peers.  He has positive for meals and group meetings.  Ryson denies SI/HI/AVH.  There is no evidence of delusional thought content no indication that patient is responding to internal stimuli.  Reshad reports feeling that he slept too long last night.  Discussed potential decrease trazodone  100 mg dose, patient declines.  Patient reports he is only able to sleep with trazodone  100 mg nightly.   We discussed continuing medications as ordered with some adjustments as follows: Restart prior to admission medications including: -Albuterol  108 mcg/ACT inhaler  2 puff every 6 hours as needed/wheezing or shortness of breath -Escitalopram  10 mg daily/mood    Diagnosis:  Final diagnoses:  Substance induced mood disorder (HCC)  Polysubstance abuse (HCC)    Total Time spent with patient: 30 minutes  Past Psychiatric History: GAD, MDD, alcohol use disorder, polysubstance use disorder, substance-induced mood disorder, suicidal ideation Past Medical History:  Past Medical History:  Diagnosis Date   ASD (atrial septal defect)    Bacterial infection due to H. pylori    Hypertension    Kidney stones     Family History: None reported Family Psychiatric  History: None reported Social History: Consuelo is currently homeless in [place].  Most recently resided at sober living facility [facility] and [facility].  He denies access to weapons he receives SSI income.  He has court date scheduled 03/30/2024.  Additional Social History:                         Sleep: Good  Appetite:  Fair  Current Medications:  Current Facility-Administered Medications  Medication Dose Route Frequency Provider Last Rate Last Admin   acetaminophen  (TYLENOL ) tablet 650 mg  650 mg Oral Q6H PRN Randall Starlyn HERO, NP       albuterol  (VENTOLIN  HFA) 108 (90 Base) MCG/ACT inhaler 2 puff  2 puff Inhalation Q6H PRN Dasie Ellouise CROME, FNP       alum & mag hydroxide-simeth (MAALOX/MYLANTA) 200-200-20 MG/5ML suspension 30 mL  30 mL Oral Q4H PRN Randall Starlyn HERO, NP       haloperidol  (HALDOL ) tablet 5  mg  5 mg Oral TID PRN Randall Starlyn HERO, NP       And   diphenhydrAMINE  (BENADRYL ) capsule 50 mg  50 mg Oral TID PRN Randall Starlyn HERO, NP       haloperidol  lactate (HALDOL ) injection 5 mg  5 mg Intramuscular TID PRN Randall Starlyn HERO, NP       And   diphenhydrAMINE  (BENADRYL ) injection 50 mg  50 mg Intramuscular TID PRN Randall Starlyn HERO, NP       And   LORazepam  (ATIVAN ) injection 2 mg  2 mg Intramuscular TID PRN Randall Starlyn HERO, NP        haloperidol  lactate (HALDOL ) injection 10 mg  10 mg Intramuscular TID PRN Randall Starlyn HERO, NP       And   diphenhydrAMINE  (BENADRYL ) injection 50 mg  50 mg Intramuscular TID PRN Randall Starlyn HERO, NP       And   LORazepam  (ATIVAN ) injection 2 mg  2 mg Intramuscular TID PRN Randall Starlyn HERO, NP       [START ON 03/17/2024] escitalopram  (LEXAPRO ) tablet 10 mg  10 mg Oral Daily Dasie Ellouise CROME, FNP       gabapentin  (NEURONTIN ) capsule 600 mg  600 mg Oral TID Lawrnce, Rhoda J, MD   600 mg at 03/16/24 1622   magnesium  hydroxide (MILK OF MAGNESIA) suspension 30 mL  30 mL Oral Daily PRN Randall Starlyn HERO, NP       nicotine  (NICODERM CQ  - dosed in mg/24 hours) patch 21 mg  21 mg Transdermal Daily Byungura, Veronique M, NP   21 mg at 03/16/24 1011   traZODone  (DESYREL ) tablet 100 mg  100 mg Oral QHS Randall Starlyn HERO, NP   100 mg at 03/15/24 2103   Current Outpatient Medications  Medication Sig Dispense Refill   albuterol  (VENTOLIN  HFA) 108 (90 Base) MCG/ACT inhaler Inhale 2 puffs into the lungs every 6 (six) hours as needed for wheezing or shortness of breath. 8 g 2   escitalopram  (LEXAPRO ) 10 MG tablet Take 1 tablet (10 mg total) by mouth daily. 30 tablet 0   gabapentin  (NEURONTIN ) 600 MG tablet Take 1 tablet (600 mg total) by mouth 3 (three) times daily. (Patient taking differently: Take 600 mg by mouth 3 (three) times daily as needed (For anxiety).) 90 tablet 0   nicotine  (NICODERM CQ  - DOSED IN MG/24 HOURS) 21 mg/24hr patch Place 1 patch (21 mg total) onto the skin daily.     traZODone  (DESYREL ) 100 MG tablet Take 1 tablet (100 mg total) by mouth at bedtime as needed for sleep. 14 tablet 0    Labs  Lab Results:  Admission on 03/15/2024, Discharged on 03/15/2024  Component Date Value Ref Range Status   WBC 03/15/2024 7.9  4.0 - 10.5 K/uL Final   RBC 03/15/2024 5.11  4.22 - 5.81 MIL/uL Final   Hemoglobin 03/15/2024 15.8  13.0 - 17.0 g/dL Final   HCT 98/79/7973 47.6  39.0  - 52.0 % Final   MCV 03/15/2024 93.2  80.0 - 100.0 fL Final   MCH 03/15/2024 30.9  26.0 - 34.0 pg Final   MCHC 03/15/2024 33.2  30.0 - 36.0 g/dL Final   RDW 98/79/7973 14.4  11.5 - 15.5 % Final   Platelets 03/15/2024 278  150 - 400 K/uL Final   nRBC 03/15/2024 0.0  0.0 - 0.2 % Final   Neutrophils Relative % 03/15/2024 67  % Final   Neutro Abs 03/15/2024 5.3  1.7 -  7.7 K/uL Final   Lymphocytes Relative 03/15/2024 22  % Final   Lymphs Abs 03/15/2024 1.7  0.7 - 4.0 K/uL Final   Monocytes Relative 03/15/2024 9  % Final   Monocytes Absolute 03/15/2024 0.7  0.1 - 1.0 K/uL Final   Eosinophils Relative 03/15/2024 0  % Final   Eosinophils Absolute 03/15/2024 0.0  0.0 - 0.5 K/uL Final   Basophils Relative 03/15/2024 1  % Final   Basophils Absolute 03/15/2024 0.1  0.0 - 0.1 K/uL Final   Immature Granulocytes 03/15/2024 1  % Final   Abs Immature Granulocytes 03/15/2024 0.04  0.00 - 0.07 K/uL Final   Performed at [facility], [address] [place], [place] [address]   Sodium 03/15/2024 138  135 - 145 mmol/L Final   Potassium 03/15/2024 4.3  3.5 - 5.1 mmol/L Final   Chloride 03/15/2024 98  98 - 111 mmol/L Final   CO2 03/15/2024 29  22 - 32 mmol/L Final   Glucose, Bld 03/15/2024 82  70 - 99 mg/dL Final   Glucose reference range applies only to samples taken after fasting for at least 8 hours.   BUN 03/15/2024 11  6 - 20 mg/dL Final   Creatinine, Ser 03/15/2024 0.71  0.61 - 1.24 mg/dL Final   Calcium  03/15/2024 9.8  8.9 - 10.3 mg/dL Final   Total Protein 98/79/7973 8.2 (H)  6.5 - 8.1 g/dL Final   Albumin 98/79/7973 4.7  3.5 - 5.0 g/dL Final   AST 98/79/7973 31  15 - 41 U/L Final   ALT 03/15/2024 23  0 - 44 U/L Final   Alkaline Phosphatase 03/15/2024 91  38 - 126 U/L Final   Total Bilirubin 03/15/2024 0.9  0.0 - 1.2 mg/dL Final   GFR, Estimated 03/15/2024 >60  >60 mL/min Final   Comment: (NOTE) Calculated using the CKD-EPI Creatinine Equation (2021)    Anion gap 03/15/2024 11  5 - 15  Final   Performed at [facility], [address] [place], [place] [address]   Hgb A1c MFr Bld 03/15/2024 5.2  4.8 - 5.6 % Final   Comment: (NOTE) Diagnosis of Diabetes The following HbA1c ranges recommended by the American Diabetes Association (ADA) may be used as an aid in the diagnosis of diabetes mellitus.  Hemoglobin             Suggested A1C NGSP%              Diagnosis  <5.7                   Non Diabetic  5.7-6.4                Pre-Diabetic  >6.4                   Diabetic  <7.0                   Glycemic control for                       adults with diabetes.     Mean Plasma Glucose 03/15/2024 102.54  mg/dL Final   Performed at [facility], [address], [place], [place] [address]   Magnesium  03/15/2024 2.2  1.7 - 2.4 mg/dL Final   Performed at [facility], [address], [place], [place] [address]   Alcohol, Ethyl (B) 03/15/2024 <15  <15 mg/dL Final  Comment: (NOTE) For medical purposes only. Performed at [facility], [address], [place], [place] [address]    Cholesterol 03/15/2024 258 (H)  0 - 200 mg/dL Final   Comment:        ATP III CLASSIFICATION:  <200     mg/dL   Desirable  799-760  mg/dL   Borderline High  >=759    mg/dL   High           Triglycerides 03/15/2024 70  <150 mg/dL Final   HDL 98/79/7973 79  >40 mg/dL Final   Total CHOL/HDL Ratio 03/15/2024 3.3  RATIO Final   VLDL 03/15/2024 14  0 - 40 mg/dL Final   LDL Cholesterol 03/15/2024 165 (H)  0 - 99 mg/dL Final   Comment:        Total Cholesterol/HDL:CHD Risk Coronary Heart Disease Risk Table                     Men   Women  1/2 Average Risk   3.4   3.3  Average Risk       5.0   4.4  2 X Average Risk   9.6   7.1  3 X Average Risk  23.4   11.0        Use the calculated Patient Ratio above and the CHD Risk Table to determine the patient's CHD Risk.        ATP III CLASSIFICATION (LDL):  <100     mg/dL   Optimal  899-870  mg/dL   Near or Above                     Optimal  130-159  mg/dL   Borderline  839-810  mg/dL   High  >809     mg/dL   Very High Performed at [facility], [address] [place], [place] [address]    TSH 03/15/2024 1.450  0.350 - 4.500 uIU/mL Final   Performed at [facility], [address] [place], [place] [address]   Color, Urine 03/15/2024 YELLOW  YELLOW Final   APPearance 03/15/2024 CLEAR  CLEAR Final   Specific Gravity, Urine 03/15/2024 1.021  1.005 - 1.030 Final   pH 03/15/2024 7.0  5.0 - 8.0 Final   Glucose, UA 03/15/2024 NEGATIVE  NEGATIVE mg/dL Final   Hgb urine dipstick 03/15/2024 NEGATIVE  NEGATIVE Final   Bilirubin Urine 03/15/2024 NEGATIVE  NEGATIVE Final   Ketones, ur 03/15/2024 20 (A)  NEGATIVE mg/dL Final   Protein, ur 98/79/7973 NEGATIVE  NEGATIVE mg/dL Final   Nitrite 98/79/7973 NEGATIVE  NEGATIVE Final   Leukocytes,Ua 03/15/2024 NEGATIVE  NEGATIVE Final   Performed at [facility], [address], [place], [place] [address]   POC Amphetamine UR 03/15/2024 None Detected  NONE DETECTED (Cut Off Level 1000 ng/mL) Final   POC Secobarbital (BAR) 03/15/2024 None Detected  NONE DETECTED (Cut Off Level 300 ng/mL) Final   POC Buprenorphine (BUP) 03/15/2024 None Detected  NONE DETECTED (Cut Off Level 10 ng/mL) Final   POC Oxazepam (BZO) 03/15/2024 None Detected  NONE DETECTED (Cut Off Level 300 ng/mL) Final   POC Cocaine UR 03/15/2024 None Detected  NONE DETECTED (Cut Off Level 300 ng/mL) Final   POC Methamphetamine UR 03/15/2024 None Detected  NONE DETECTED (Cut Off Level 1000 ng/mL) Final   POC Morphine 03/15/2024 None Detected  NONE DETECTED (Cut Off Level 300 ng/mL) Final   POC Methadone  UR 03/15/2024 None Detected  NONE DETECTED (Cut Off Level 300 ng/mL) Final   POC Oxycodone  UR 03/15/2024 None Detected  NONE DETECTED (Cut Off Level 100 ng/mL) Final   POC Marijuana UR 03/15/2024 Positive (A)  NONE DETECTED (Cut Off Level 50 ng/mL) Final  Admission on 02/09/2024, Discharged on  02/09/2024  Component Date Value Ref Range Status   Sodium 02/09/2024 140  135 - 145 mmol/L Final   Potassium 02/09/2024 3.8  3.5 - 5.1 mmol/L Final   Chloride 02/09/2024 99  98 - 111 mmol/L Final   CO2 02/09/2024 24  22 - 32 mmol/L Final   Glucose, Bld 02/09/2024 90  70 - 99 mg/dL Final   Glucose reference range applies only to samples taken after fasting for at least 8 hours.   BUN 02/09/2024 5 (L)  6 - 20 mg/dL Final   Creatinine, Ser 02/09/2024 0.72  0.61 - 1.24 mg/dL Final   Calcium  02/09/2024 9.9  8.9 - 10.3 mg/dL Final   Total Protein 87/83/7974 8.7 (H)  6.5 - 8.1 g/dL Final   Albumin 87/83/7974 4.8  3.5 - 5.0 g/dL Final   AST 87/83/7974 25  15 - 41 U/L Final   ALT 02/09/2024 9  0 - 44 U/L Final   Alkaline Phosphatase 02/09/2024 85  38 - 126 U/L Final   Total Bilirubin 02/09/2024 0.3  0.0 - 1.2 mg/dL Final   GFR, Estimated 02/09/2024 >60  >60 mL/min Final   Comment: (NOTE) Calculated using the CKD-EPI Creatinine Equation (2021)    Anion gap 02/09/2024 16 (H)  5 - 15 Final   Performed at [facility], [address], [place], [place] [address]   Alcohol, Ethyl (B) 02/09/2024 87 (H)  <15 mg/dL Final   Comment: (NOTE) For medical purposes only. Performed at [facility], [address], [place], [place] [address]    WBC 02/09/2024 13.1 (H)  4.0 - 10.5 K/uL Final   RBC 02/09/2024 5.35  4.22 - 5.81 MIL/uL Final   Hemoglobin 02/09/2024 16.1  13.0 - 17.0 g/dL Final   HCT 87/83/7974 48.6  39.0 - 52.0 % Final   MCV 02/09/2024 90.8  80.0 - 100.0 fL Final   MCH 02/09/2024 30.1  26.0 - 34.0 pg Final   MCHC 02/09/2024 33.1  30.0 - 36.0 g/dL Final   RDW 87/83/7974 12.9  11.5 - 15.5 % Final   Platelets 02/09/2024 321  150 - 400 K/uL Final   nRBC 02/09/2024 0.0  0.0 - 0.2 % Final   Performed at [facility], [address] [place], [place] [address]   Opiates 02/09/2024 NEGATIVE  NEGATIVE Final   Cocaine 02/09/2024 NEGATIVE   NEGATIVE Final   Benzodiazepines 02/09/2024 NEGATIVE  NEGATIVE Final   Amphetamines 02/09/2024 NEGATIVE  NEGATIVE Final   Tetrahydrocannabinol 02/09/2024 POSITIVE (A)  NEGATIVE Final   Barbiturates 02/09/2024 NEGATIVE  NEGATIVE Final   Methadone Scn, Ur 02/09/2024 NEGATIVE  NEGATIVE Final   Fentanyl  02/09/2024 NEGATIVE  NEGATIVE Final   Comment: (NOTE) Drug screen is for Medical Purposes only. Positive results are preliminary only. If confirmation is needed, notify lab within 5 days.  Drug Class                 Cutoff (ng/mL) Amphetamine and metabolites 1000 Barbiturate and metabolites 200 Benzodiazepine              200 Opiates and metabolites     300 Cocaine and metabolites  300 THC                         50 Fentanyl                     5 Methadone                   300  Trazodone  is metabolized in vivo to several metabolites,  including pharmacologically active m-CPP, which is excreted in the  urine.  Immunoassay screens for amphetamines and MDMA have potential  cross-reactivity with these compounds and may provide false positive  result.  Performed at [facility], [address], [place], [place] [address]   Clinical Support on 01/06/2024  Component Date Value Ref Range Status   Hepatitis B Surface Ag 01/06/2024 Non Reactive   Final   Hepatitis B Surface Ag 01/06/2024 Non Reactive   Final   Hep B Core Total Ab 01/06/2024 Non Reactive   Final  Admission on 11/20/2023, Discharged on 11/21/2023  Component Date Value Ref Range Status   Sodium 11/20/2023 139  135 - 145 mmol/L Final   Potassium 11/20/2023 3.4 (L)  3.5 - 5.1 mmol/L Final   Chloride 11/20/2023 105  98 - 111 mmol/L Final   CO2 11/20/2023 22  22 - 32 mmol/L Final   Glucose, Bld 11/20/2023 99  70 - 99 mg/dL Final   Glucose reference range applies only to samples taken after fasting for at least 8 hours.   BUN 11/20/2023 6  6 - 20 mg/dL Final   Creatinine, Ser 11/20/2023 0.65  0.61 - 1.24  mg/dL Final   Calcium  11/20/2023 8.5 (L)  8.9 - 10.3 mg/dL Final   Total Protein 90/73/7974 8.0  6.5 - 8.1 g/dL Final   Albumin 90/73/7974 3.7  3.5 - 5.0 g/dL Final   AST 90/73/7974 24  15 - 41 U/L Final   ALT 11/20/2023 20  0 - 44 U/L Final   Alkaline Phosphatase 11/20/2023 55  38 - 126 U/L Final   Total Bilirubin 11/20/2023 0.4  0.0 - 1.2 mg/dL Final   GFR, Estimated 11/20/2023 >60  >60 mL/min Final   Comment: (NOTE) Calculated using the CKD-EPI Creatinine Equation (2021)    Anion gap 11/20/2023 12  5 - 15 Final   Performed at [facility], [address] [place], [place] [address]   Alcohol, Ethyl (B) 11/20/2023 159 (H)  <15 mg/dL Final   Comment: (NOTE) For medical purposes only. Performed at [facility], [address] [place], [place] [address]    WBC 11/20/2023 7.9  4.0 - 10.5 K/uL Final   RBC 11/20/2023 4.68  4.22 - 5.81 MIL/uL Final   Hemoglobin 11/20/2023 13.9  13.0 - 17.0 g/dL Final   HCT 90/73/7974 42.5  39.0 - 52.0 % Final   MCV 11/20/2023 90.8  80.0 - 100.0 fL Final   MCH 11/20/2023 29.7  26.0 - 34.0 pg Final   MCHC 11/20/2023 32.7  30.0 - 36.0 g/dL Final   RDW 90/73/7974 15.1  11.5 - 15.5 % Final   Platelets 11/20/2023 284  150 - 400 K/uL Final   nRBC 11/20/2023 0.0  0.0 - 0.2 % Final   Neutrophils Relative % 11/20/2023 57  % Final   Neutro Abs 11/20/2023 4.6  1.7 - 7.7 K/uL Final   Lymphocytes Relative 11/20/2023 34  % Final   Lymphs Abs 11/20/2023 2.7  0.7 - 4.0 K/uL Final  Monocytes Relative 11/20/2023 7  % Final   Monocytes Absolute 11/20/2023 0.5  0.1 - 1.0 K/uL Final   Eosinophils Relative 11/20/2023 1  % Final   Eosinophils Absolute 11/20/2023 0.1  0.0 - 0.5 K/uL Final   Basophils Relative 11/20/2023 1  % Final   Basophils Absolute 11/20/2023 0.1  0.0 - 0.1 K/uL Final   Immature Granulocytes 11/20/2023 0  % Final   Abs Immature Granulocytes 11/20/2023 0.03  0.00 - 0.07 K/uL Final   Performed at [facility], [address] [place], [place] [address]  Admission on 11/12/2023, Discharged on 11/12/2023  Component Date Value Ref Range Status   WBC 11/12/2023 8.7  4.0 - 10.5 K/uL Final   RBC 11/12/2023 4.33  4.22 - 5.81 MIL/uL Final   Hemoglobin 11/12/2023 13.4  13.0 - 17.0 g/dL Final   HCT 90/81/7974 40.4  39.0 - 52.0 % Final   MCV 11/12/2023 93.3  80.0 - 100.0 fL Final   MCH 11/12/2023 30.9  26.0 - 34.0 pg Final   MCHC 11/12/2023 33.2  30.0 - 36.0 g/dL Final   RDW 90/81/7974 14.8  11.5 - 15.5 % Final   Platelets 11/12/2023 389  150 - 400 K/uL Final   nRBC 11/12/2023 0.0  0.0 - 0.2 % Final   Neutrophils Relative % 11/12/2023 55  % Final   Neutro Abs 11/12/2023 5.0  1.7 - 7.7 K/uL Final   Lymphocytes Relative 11/12/2023 33  % Final   Lymphs Abs 11/12/2023 2.8  0.7 - 4.0 K/uL Final   Monocytes Relative 11/12/2023 9  % Final   Monocytes Absolute 11/12/2023 0.7  0.1 - 1.0 K/uL Final   Eosinophils Relative 11/12/2023 1  % Final   Eosinophils Absolute 11/12/2023 0.1  0.0 - 0.5 K/uL Final   Basophils Relative 11/12/2023 1  % Final   Basophils Absolute 11/12/2023 0.1  0.0 - 0.1 K/uL Final   WBC Morphology 11/12/2023 See Note   Final   Mild Left Shift (1-5% metas, occ myelo)   RBC Morphology 11/12/2023 See Note   Final   MIXED RBC POPULATION   Smear Review 11/12/2023 Normal platelet morphology   Final   Immature Granulocytes 11/12/2023 1  % Final   Abs Immature Granulocytes 11/12/2023 0.06  0.00 - 0.07 K/uL Final   Performed at [facility], [address] [place], [place] [address]   Sodium 11/12/2023 140  135 - 145 mmol/L Final   Potassium 11/12/2023 3.3 (L)  3.5 - 5.1 mmol/L Final   Chloride 11/12/2023 107  98 - 111 mmol/L Final   CO2 11/12/2023 24  22 - 32 mmol/L Final   Glucose, Bld 11/12/2023 98  70 - 99 mg/dL Final   Glucose reference range applies only to samples taken after fasting for at least 8 hours.   BUN 11/12/2023 9  6 - 20 mg/dL Final   Creatinine, Ser 11/12/2023 0.70  0.61 -  1.24 mg/dL Final   Calcium  11/12/2023 8.2 (L)  8.9 - 10.3 mg/dL Final   GFR, Estimated 11/12/2023 >60  >60 mL/min Final   Comment: (NOTE) Calculated using the CKD-EPI Creatinine Equation (2021)    Anion gap 11/12/2023 9  5 - 15 Final   Performed at [facility], [address] [place], [place] [address]   Alcohol, Ethyl (B) 11/12/2023 140 (H)  <15 mg/dL Final   Comment: (NOTE) For medical purposes only. Performed at [facility], [address], [place], [place] [address]  Acetaminophen  (Tylenol ), Serum 11/12/2023 <10 (L)  10 - 30 ug/mL Final   Comment: (NOTE) Therapeutic concentrations vary significantly. A range of 10-30 ug/mL  may be an effective concentration for many patients. However, some  are best treated at concentrations outside of this range. Acetaminophen  concentrations >150 ug/mL at 4 hours after ingestion  and >50 ug/mL at 12 hours after ingestion are often associated with  toxic reactions.  Performed at [facility], [address] [place], [place] [address]    Salicylate Lvl 11/12/2023 <7.0 (L)  7.0 - 30.0 mg/dL Final   Performed at [facility], [address] [place], [place] [address]   Troponin I (High Sensitivity) 11/12/2023 4  <18 ng/L Final   Comment: (NOTE) Elevated high sensitivity troponin I (hsTnI) values and significant  changes across serial measurements may suggest ACS but many other  chronic and acute conditions are known to elevate hsTnI results.  Refer to the Links section for chest pain algorithms and additional  guidance. Performed at [facility], [address] [place], [place] [address]    Tricyclic, Ur Screen 11/12/2023 NONE DETECTED  NONE DETECTED Final   Amphetamines, Ur Screen 11/12/2023 NONE DETECTED  NONE DETECTED Final   MDMA (Ecstasy)Ur Screen 11/12/2023 NONE DETECTED  NONE DETECTED Final   Cocaine Metabolite,Ur [place] 11/12/2023 NONE DETECTED  NONE DETECTED Final   Opiate, Ur  Screen 11/12/2023 NONE DETECTED  NONE DETECTED Final   Phencyclidine (PCP) Ur S 11/12/2023 NONE DETECTED  NONE DETECTED Final   Cannabinoid 50 Ng, Ur [place] 11/12/2023 POSITIVE (A)  NONE DETECTED Final   Barbiturates, Ur Screen 11/12/2023 NONE DETECTED  NONE DETECTED Final   Benzodiazepine, Ur Scrn 11/12/2023 NONE DETECTED  NONE DETECTED Final   Methadone Scn, Ur 11/12/2023 NONE DETECTED  NONE DETECTED Final   Comment: (NOTE) Tricyclics + metabolites, urine    Cutoff 1000 ng/mL Amphetamines + metabolites, urine  Cutoff 1000 ng/mL MDMA (Ecstasy), urine              Cutoff 500 ng/mL Cocaine Metabolite, urine          Cutoff 300 ng/mL Opiate + metabolites, urine        Cutoff 300 ng/mL Phencyclidine (PCP), urine         Cutoff 25 ng/mL Cannabinoid, urine                 Cutoff 50 ng/mL Barbiturates + metabolites, urine  Cutoff 200 ng/mL Benzodiazepine, urine              Cutoff 200 ng/mL Methadone, urine                   Cutoff 300 ng/mL  The urine drug screen provides only a preliminary, unconfirmed analytical test result and should not be used for non-medical purposes. Clinical consideration and professional judgment should be applied to any positive drug screen result due to possible interfering substances. A more specific alternate chemical method must be used in order to obtain a confirmed analytical result. Gas chromatography / mass spectrometry (GC/MS) is the preferred confirm                          atory method. Performed at [facility], [address] [place], [place] [address]    Color, Urine 11/12/2023 YELLOW (A)  YELLOW Final   APPearance 11/12/2023 CLEAR (A)  CLEAR Final   Specific Gravity, Urine 11/12/2023 1.019  1.005 -  1.030 Final   pH 11/12/2023 5.0  5.0 - 8.0 Final   Glucose, UA 11/12/2023 NEGATIVE  NEGATIVE mg/dL Final   Hgb urine dipstick 11/12/2023 NEGATIVE  NEGATIVE Final   Bilirubin Urine 11/12/2023 NEGATIVE  NEGATIVE Final   Ketones, ur 11/12/2023  NEGATIVE  NEGATIVE mg/dL Final   Protein, ur 90/81/7974 NEGATIVE  NEGATIVE mg/dL Final   Nitrite 90/81/7974 NEGATIVE  NEGATIVE Final   Leukocytes,Ua 11/12/2023 NEGATIVE  NEGATIVE Final   Performed at [facility], [address] [place], [place] [address]   Troponin I (High Sensitivity) 11/12/2023 4  <18 ng/L Final   Comment: (NOTE) Elevated high sensitivity troponin I (hsTnI) values and significant  changes across serial measurements may suggest ACS but many other  chronic and acute conditions are known to elevate hsTnI results.  Refer to the Links section for chest pain algorithms and additional  guidance. Performed at [facility], [address] [place], [place] [address]    B Natriuretic Peptide 11/12/2023 6.2  0.0 - 100.0 pg/mL Final   Performed at [facility], [address] [place], [place] [address]  Admission on 11/06/2023, Discharged on 11/10/2023  Component Date Value Ref Range Status   SARS Coronavirus 2 by RT PCR 11/09/2023 NEGATIVE  NEGATIVE Final   Comment: (NOTE) SARS-CoV-2 target nucleic acids are NOT DETECTED.  The SARS-CoV-2 RNA is generally detectable in upper respiratory specimens during the acute phase of infection. The lowest concentration of SARS-CoV-2 viral copies this assay can detect is 138 copies/mL. A negative result does not preclude SARS-Cov-2 infection and should not be used as the sole basis for treatment or other patient management decisions. A negative result may occur with  improper specimen collection/handling, submission of specimen other than nasopharyngeal swab, presence of viral mutation(s) within the areas targeted by this assay, and inadequate number of viral copies(<138 copies/mL). A negative result must be combined with clinical observations, patient history, and epidemiological information. The expected result is Negative.  Fact Sheet for Patients:  bloggercourse.com  Fact  Sheet for Healthcare Providers:  seriousbroker.it  This test is no                          t yet approved or cleared by the [place]  FDA and  has been authorized for detection and/or diagnosis of SARS-CoV-2 by FDA under an Emergency Use Authorization (EUA). This EUA will remain  in effect (meaning this test can be used) for the duration of the COVID-19 declaration under Section 564(b)(1) of the Act, 21 U.S.C.section 360bbb-3(b)(1), unless the authorization is terminated  or revoked sooner.       Influenza A by PCR 11/09/2023 NEGATIVE  NEGATIVE Final   Influenza B by PCR 11/09/2023 NEGATIVE  NEGATIVE Final   Comment: (NOTE) The Xpert Xpress SARS-CoV-2/FLU/RSV plus assay is intended as an aid in the diagnosis of influenza from Nasopharyngeal swab specimens and should not be used as a sole basis for treatment. Nasal washings and aspirates are unacceptable for Xpert Xpress SARS-CoV-2/FLU/RSV testing.  Fact Sheet for Patients: bloggercourse.com  Fact Sheet for Healthcare Providers: seriousbroker.it  This test is not yet approved or cleared by the [place]  FDA and has been authorized for detection and/or diagnosis of SARS-CoV-2 by FDA under an Emergency Use Authorization (EUA). This EUA will remain in effect (meaning this test can be used) for the duration of the COVID-19 declaration under Section 564(b)(1) of the Act, 21 U.S.C. section 360bbb-3(b)(1), unless  the authorization is terminated or revoked.     Resp Syncytial Virus by PCR 11/09/2023 NEGATIVE  NEGATIVE Final   Comment: (NOTE) Fact Sheet for Patients: bloggercourse.com  Fact Sheet for Healthcare Providers: seriousbroker.it  This test is not yet approved or cleared by the [place]  FDA and has been authorized for detection and/or diagnosis of SARS-CoV-2 by FDA under an Emergency  Use Authorization (EUA). This EUA will remain in effect (meaning this test can be used) for the duration of the COVID-19 declaration under Section 564(b)(1) of the Act, 21 U.S.C. section 360bbb-3(b)(1), unless the authorization is terminated or revoked.  Performed at [facility], [address], [place], [place] [address]   Admission on 11/05/2023, Discharged on 11/06/2023  Component Date Value Ref Range Status   Sodium 11/05/2023 139  135 - 145 mmol/L Final   Potassium 11/05/2023 3.8  3.5 - 5.1 mmol/L Final   Chloride 11/05/2023 101  98 - 111 mmol/L Final   CO2 11/05/2023 27  22 - 32 mmol/L Final   Glucose, Bld 11/05/2023 79  70 - 99 mg/dL Final   Glucose reference range applies only to samples taken after fasting for at least 8 hours.   BUN 11/05/2023 <5 (L)  6 - 20 mg/dL Final   Creatinine, Ser 11/05/2023 0.59 (L)  0.61 - 1.24 mg/dL Final   Calcium  11/05/2023 9.7  8.9 - 10.3 mg/dL Final   Total Protein 90/88/7974 9.0 (H)  6.5 - 8.1 g/dL Final   Albumin 90/88/7974 3.8  3.5 - 5.0 g/dL Final   AST 90/88/7974 24  15 - 41 U/L Final   ALT 11/05/2023 11  0 - 44 U/L Final   Alkaline Phosphatase 11/05/2023 89  38 - 126 U/L Final   Total Bilirubin 11/05/2023 0.3  0.0 - 1.2 mg/dL Final   GFR, Estimated 11/05/2023 >60  >60 mL/min Final   Comment: (NOTE) Calculated using the CKD-EPI Creatinine Equation (2021)    Anion gap 11/05/2023 12  5 - 15 Final   Performed at [facility], [address], [place], [place] [address]   Alcohol, Ethyl (B) 11/05/2023 192 (H)  <15 mg/dL Final   Comment: (NOTE) For medical purposes only. Performed at [facility], [address], [place], [place] [address]    Opiates 11/05/2023 NEGATIVE  NEGATIVE Final   Cocaine 11/05/2023 NEGATIVE  NEGATIVE Final   Benzodiazepines 11/05/2023 NEGATIVE  NEGATIVE Final   Amphetamines 11/05/2023 NEGATIVE  NEGATIVE Final   Tetrahydrocannabinol 11/05/2023 NEGATIVE   NEGATIVE Final   Barbiturates 11/05/2023 NEGATIVE  NEGATIVE Final   Methadone Scn, Ur 11/05/2023 NEGATIVE  NEGATIVE Final   Fentanyl  11/05/2023 NEGATIVE  NEGATIVE Final   Comment: (NOTE) Drug screen is for Medical Purposes only. Positive results are preliminary only. If confirmation is needed, notify lab within 5 days.  Drug Class                 Cutoff (ng/mL) Amphetamine and metabolites 1000 Barbiturate and metabolites 200 Benzodiazepine              200 Opiates and metabolites     300 Cocaine and metabolites     300 THC                         50 Fentanyl                     5 Methadone  300  Trazodone  is metabolized in vivo to several metabolites,  including pharmacologically active m-CPP, which is excreted in the  urine.  Immunoassay screens for amphetamines and MDMA have potential  cross-reactivity with these compounds and may provide false positive  result.  Performed at [facility], [address] [place], [place] [address]    WBC 11/05/2023 12.5 (H)  4.0 - 10.5 K/uL Final   RBC 11/05/2023 4.90  4.22 - 5.81 MIL/uL Final   Hemoglobin 11/05/2023 14.2  13.0 - 17.0 g/dL Final   HCT 90/88/7974 45.6  39.0 - 52.0 % Final   MCV 11/05/2023 93.1  80.0 - 100.0 fL Final   MCH 11/05/2023 29.0  26.0 - 34.0 pg Final   MCHC 11/05/2023 31.1  30.0 - 36.0 g/dL Final   RDW 90/88/7974 14.5  11.5 - 15.5 % Final   Platelets 11/05/2023 464 (H)  150 - 400 K/uL Final   nRBC 11/05/2023 0.0  0.0 - 0.2 % Final   Neutrophils Relative % 11/05/2023 69  % Final   Neutro Abs 11/05/2023 8.6 (H)  1.7 - 7.7 K/uL Final   Lymphocytes Relative 11/05/2023 23  % Final   Lymphs Abs 11/05/2023 2.8  0.7 - 4.0 K/uL Final   Monocytes Relative 11/05/2023 6  % Final   Monocytes Absolute 11/05/2023 0.8  0.1 - 1.0 K/uL Final   Eosinophils Relative 11/05/2023 0  % Final   Eosinophils Absolute 11/05/2023 0.1  0.0 - 0.5 K/uL Final   Basophils Relative 11/05/2023 1  % Final    Basophils Absolute 11/05/2023 0.1  0.0 - 0.1 K/uL Final   Immature Granulocytes 11/05/2023 1  % Final   Abs Immature Granulocytes 11/05/2023 0.11 (H)  0.00 - 0.07 K/uL Final   Performed at [facility], [address], [place], [place] [address]   Acetaminophen  (Tylenol ), Serum 11/05/2023 <10 (L)  10 - 30 ug/mL Final   Comment: (NOTE) Toxic concentrations can be more effectively related to post dose interval; > 200, > 100, and > 50 ug/mL serum concentrations correspond to toxic concentrations at 4, 8, and 12 hours post dose, respectively.  Performed at [facility], [address], [place], [place] [address]    Salicylate Lvl 11/05/2023 <7.0 (L)  7.0 - 30.0 mg/dL Final   Performed at [facility], [address], [place], [place] [address]   Troponin T High Sensitivity 11/05/2023 <15  0 - 19 ng/L Final   Comment: (NOTE) Biotin concentrations > 1000 ng/mL falsely decrease TnT results.  Serial cardiac troponin measurements are suggested.  Refer to the Links section for chest pain algorithms and additional  guidance. Performed at [facility], [address], [place], [place] [address]     Blood Alcohol level:  Lab Results  Component Value Date   ETH <15 03/15/2024   ETH 87 (H) 02/09/2024    Metabolic Disorder Labs: Lab Results  Component Value Date   HGBA1C 5.2 03/15/2024   MPG 102.54 03/15/2024   MPG 102.54 08/07/2023   No results found for: PROLACTIN Lab Results  Component Value Date   CHOL 258 (H) 03/15/2024   TRIG 70 03/15/2024   HDL 79 03/15/2024   CHOLHDL 3.3 03/15/2024   VLDL 14 03/15/2024   LDLCALC 165 (H) 03/15/2024   LDLCALC 197 (H) 08/07/2023    Therapeutic Lab Levels: No results found for: LITHIUM Lab Results  Component Value Date   VALPROATE 40 (L) 10/07/2019  VALPROATE 84 09/12/2019   No results found for: CBMZ  Physical Findings   AUDIT    Flowsheet Row  [facility] (Discharged) from 09/09/2019 in [facility]  Alcohol Use Disorder Identification Test Final Score (AUDIT) 13 6   GAD-7    Flowsheet Row Integrated [facility]al [facility]  Total GAD-7 Score 5   PHQ2-9    Flowsheet Row ED from 03/15/2024 in [facility] Most recent reading at 03/16/2024  4:14 PM ED from 03/15/2024 in [facility] Most recent reading at 03/15/2024 11:32 AM ED from 02/09/2024 in [facility] Most recent reading at 02/12/2024 10:02 AM ED from 08/05/2023 in [facility] Most recent reading at 08/09/2023 10:05 AM Integrated Behavioral Health from 07/19/2019 [facility] Most recent reading at 07/19/2019  3:18 PM  PHQ-2 Total Score 0 4 6 2 4   PHQ-9 Total Score 7 12 11 7 10    Flowsheet Row ED from 03/15/2024 in [facility] Most recent reading at 03/15/2024  1:31 PM ED from 03/15/2024 in [facility] Most recent reading at 03/15/2024 10:23 AM ED from 02/09/2024 in [facility] Most recent reading at 02/11/2024 11:43 AM  C-SSRS RISK CATEGORY No Risk High Risk No Risk     Musculoskeletal  Strength & Muscle Tone: within normal limits Gait & Station: normal Patient leans: N/A  Psychiatric Specialty Exam  Presentation  General Appearance:  Appropriate for Environment  Eye Contact: Good  Speech: Clear and Coherent; Normal Rate  Speech Volume: Increased  Handedness: Right   Mood and Affect  Mood: Anxious  Affect: Congruent   Thought Process  Thought Processes: Coherent; Goal Directed  Descriptions of Associations:Intact  Orientation:Full (Time, Place and Person)  Thought Content:Logical; WDL   Diagnosis of Schizophrenia or Schizoaffective disorder in past: No    Hallucinations:Hallucinations: None  Ideas of Reference:None  Suicidal Thoughts:Suicidal Thoughts: No  Homicidal Thoughts:Homicidal Thoughts: No   Sensorium  Memory: Immediate Good; Recent Fair  Judgment: Intact  Insight: Present   Executive Functions  Concentration: Fair  Attention Span: Fair  Recall: [employer] of Knowledge: Fair  Language: Fair   Psychomotor Activity  Psychomotor Activity: Psychomotor Activity: Normal   Assets  Assets: Communication Skills; Desire for Improvement; Social Support   Sleep  Sleep: Sleep: Good  Estimated Sleeping Duration (Last 24 Hours): 9.00-10.25 hours  Nutritional Assessment (For OBS and [facility]BC admissions only) Has the patient had a weight loss or gain of 10 pounds or more in the last 3 months?: No Has the patient had a decrease in food intake/or appetite?: No Does the patient have dental problems?: No Does the patient have eating habits or behaviors that may be indicators of an eating disorder including binging or inducing vomiting?: No Has the patient recently lost weight without trying?: 0 Has the patient been eating poorly because of a decreased appetite?: 0 Malnutrition Screening Tool Score: 0    Physical Exam  Physical Exam Vitals and nursing note reviewed.  Constitutional:      Appearance: Normal appearance. He is well-developed.  HENT:     Head: Normocephalic and atraumatic.     Nose: Nose normal.  Cardiovascular:     Rate and Rhythm: Normal rate.  Pulmonary:     Effort: Pulmonary effort is  normal.  Musculoskeletal:        General: Normal range of motion.     Cervical back: Normal range of motion.  Skin:    General: Skin is warm and dry.  Neurological:     Mental Status: He is alert and oriented to person, place, and time.  Psychiatric:        Attention and Perception: Attention and perception normal.        Mood and  Affect: Affect normal. Mood is anxious.        Speech: Speech normal.        Behavior: Behavior normal. Behavior is cooperative.        Thought Content: Thought content normal.        Cognition and Memory: Cognition and memory normal.    Review of Systems  Constitutional: Negative.   HENT: Negative.    Eyes: Negative.   Respiratory: Negative.    Cardiovascular: Negative.   Gastrointestinal: Negative.   Genitourinary: Negative.   Musculoskeletal: Negative.   Skin: Negative.   Neurological: Negative.   Psychiatric/Behavioral:  Positive for substance abuse. The patient is nervous/anxious.    Blood pressure 111/63, pulse 70, temperature 98 F (36.7 C), temperature source Oral, resp. rate 18, SpO2 100%. There is no height or weight on file to calculate BMI.  Treatment Plan Summary: Continued admission facility-based crisis unit. Daily contact with patient to assess and evaluate symptoms and progress in treatment  Labs Reviewed: No new lab orders placed.  03/15/2024: UDS + THC  EKG from 03/15/2024 (baseline) with NSR Qtc.  03/16/2024 CIWA=0.  Medication Management: Restart prior to admission medications including: -Albuterol  108 mcg/ACT inhaler 2 puff every 6 hours as needed/wheezing or shortness of breath -Escitalopram  10 mg daily/mood  Continue:  -Acetaminophen  650 mg every 6 hours, as needed/mild pain -Maalox 30 mL oral every 4 hours, as needed/digestion -Gabapentin  600 mg 3 times daily -Magnesium  hydroxide 30 mL daily as needed/mild constipation -Trazodone  50 mg nightly, as needed/sleep -NicoDerm 21 mg transdermal patch daily/nicotine  withdrawal  -Agitation protocol - CIWA every 4 hours   Discharge Planning: Patient considering sober living/[facility]. Disposition/case management to assist with discharge planning and identification of follow-up needs including residential or outpatient substance use treatment options as needed, prior to discharge Discharge  Concerns: Need to confirm safety plan; Medication compliance and effectiveness. Homelessness. Discharge Goals: Discharge with outpatient referrals for mental health follow-up including medication management/psychotherapy and substance use treatment follow-up resources as appropriate   Note: This document was prepared using Dragon voice recognition software and may include unintentional dictation errors.  Ellouise LITTIE Dawn, FNP 03/16/2024 6:30 PM

## 2024-03-16 NOTE — ED Notes (Signed)
 Pt requested tylenol   for slight headache scored 5/10.  Offering glaring eye contact, approached nurse by saying, I need some tylenol  for this damn headache and I want all the medications that is prescribed to me so that I can get somerest.  Pt denies thoughts of SI, or intent to self harm.  Maintained on CIWA = 2.  Pt resting at this time without further complaints.  Needs anticipated.  Safety maintained.

## 2024-03-17 DIAGNOSIS — Z5900 Homelessness unspecified: Secondary | ICD-10-CM | POA: Diagnosis not present

## 2024-03-17 DIAGNOSIS — F1994 Other psychoactive substance use, unspecified with psychoactive substance-induced mood disorder: Secondary | ICD-10-CM | POA: Diagnosis not present

## 2024-03-17 DIAGNOSIS — F1014 Alcohol abuse with alcohol-induced mood disorder: Secondary | ICD-10-CM | POA: Diagnosis not present

## 2024-03-17 DIAGNOSIS — F329 Major depressive disorder, single episode, unspecified: Secondary | ICD-10-CM | POA: Diagnosis not present

## 2024-03-17 MED ORDER — HYDROXYZINE HCL 25 MG PO TABS
25.0000 mg | ORAL_TABLET | Freq: Three times a day (TID) | ORAL | Status: DC | PRN
  Administered 2024-03-17 – 2024-03-18 (×2): 25 mg via ORAL
  Filled 2024-03-17 (×2): qty 1

## 2024-03-17 NOTE — ED Provider Notes (Addendum)
 Behavioral Health Progress Note  Date and Time: 03/17/2024 9:55 AM Name: John [name] MRN:  [id number]  HPI: By Starlyn NOVAK., NP on 03/15/2024: 44 year-old male who appears disheveled. He is anxious and restless, stating that he is tired of using substances and getting in trouble. He is alert and oriented and denying SI/HI/AVH. Speech is clear and thought process is coherent. Patient is preoccupied by housing issues but also states I want to start over, I want to detox and go to rehab... He reports that he experience difficulty abstaining from alcohol and drug use due to the environment he's is surrounded with. States this time he relapsed because everyone was using, its crazy there.   He reports that he uses about 3 liters of liquor every day. Uses cocaine and THC every day,  amount unknown I smoke a lot. Last use was last night.  Patient left the boarding house yesterday and stayed at [facility] but can not return there.  Patient denies experiencing withdrawal symptoms. No major distress noted.  When discussing about his multiple admissions, with no long term outcome, patient states this time I am ready, tired of living like this.  He denies current medical concerns. He reports that his appetite is good stating I am hungry. Reports sleeping well when he takes Trazodone .   Subjective: Patient is seen face-to-face by this provider and chart reviewed on 03/17/2024. On evaluation, patient appears in no acute distress. He is alert and oriented x 4, calm, and cooperative. His mood is euthymic and his affect is congruent with his mood. Describes his mood as pretty good. Energy as alright. Reports pretty good sleep and a good appetite. Denies any depression, but reports anxiety increases when he's around his peers that can be loud. Reports previously attending rehab at [facility] but was kicked out due to drinking and the longest he has been sober for was a year. Denies any alcohol  related withdrawal symptoms today. He denies suicidal and homicidal ideations. He denies auditory and visual hallucinations. He denies paranoia. Patient is requesting a prn anxiety medication for which atarax  25 mg tid as needed for anxiety/sleep was discussed. Atarax  uses, side/adverse effects, and monitoring discussed. He is agreeable to trying hydroxyzine  as needed for his anxiety. Reports lat use of cocaine was Sunday and Saturday for THC. Reports the amount and frequency of his use of cocaine and THC depends on how much money he has. He reports the more money he has, the more he buys. Reports last drinking alcohol on Monday (pure liquor: 4 locos that are 13.9% alcohol and 32 ounces each in size. Reports alcohol use of every day. Reports attending past rehab programs at [facility], [facility] for 90 days, and [facility] for 8 months. He reports tolerating his medications well without any side effects.  Disposition: Tentative discharge date of tomorrow if he is accepted to an [facility]. He has a phone interview scheduled with them at 6 pm today. Otherwise, tentative date for Monday, January 26th, 2026. He continues to express interest in attending a long term residential rehab program like an [address].  Diagnosis:  Final diagnoses:  Substance induced mood disorder (HCC)  Polysubstance abuse (HCC)    Total Time spent with patient: 20 minutes  Past Psychiatric History: GAD, MDD, alcohol use disorder, polysubstance use disorder, substance-induced mood disorder, suicidal ideation Past Medical History:  Past Medical History:  Diagnosis Date   ASD (atrial septal defect)    Bacterial  infection due to H. pylori    Hypertension    Kidney stones    Family History: None reported Family Psychiatric  History: Younger brother had hung himself in a suicide attempt. Social History: Fermin is currently homeless in [place].  Most recently resided at a sober living facility [address] and  [facility].  He denies access to weapons. He receives SSI income.  He has court date scheduled 03/30/2024.  Additional Social History:   Sleep: Good  Appetite:  Good  Current Medications:  Current Facility-Administered Medications  Medication Dose Route Frequency Provider Last Rate Last Admin   acetaminophen  (TYLENOL ) tablet 650 mg  650 mg Oral Q6H PRN Randall Starlyn HERO, NP   650 mg at 03/16/24 2149   albuterol  (VENTOLIN  HFA) 108 (90 Base) MCG/ACT inhaler 2 puff  2 puff Inhalation Q6H PRN Dasie Ellouise CROME, FNP       alum & mag hydroxide-simeth (MAALOX/MYLANTA) 200-200-20 MG/5ML suspension 30 mL  30 mL Oral Q4H PRN Randall, Veronique M, NP       haloperidol  (HALDOL ) tablet 5 mg  5 mg Oral TID PRN Randall Starlyn HERO, NP       And   diphenhydrAMINE  (BENADRYL ) capsule 50 mg  50 mg Oral TID PRN Randall Starlyn HERO, NP       haloperidol  lactate (HALDOL ) injection 5 mg  5 mg Intramuscular TID PRN Randall Starlyn HERO, NP       And   diphenhydrAMINE  (BENADRYL ) injection 50 mg  50 mg Intramuscular TID PRN Randall Starlyn HERO, NP       And   LORazepam  (ATIVAN ) injection 2 mg  2 mg Intramuscular TID PRN Randall Starlyn HERO, NP       haloperidol  lactate (HALDOL ) injection 10 mg  10 mg Intramuscular TID PRN Randall Starlyn HERO, NP       And   diphenhydrAMINE  (BENADRYL ) injection 50 mg  50 mg Intramuscular TID PRN Randall Starlyn HERO, NP       And   LORazepam  (ATIVAN ) injection 2 mg  2 mg Intramuscular TID PRN Randall Starlyn HERO, NP       escitalopram  (LEXAPRO ) tablet 10 mg  10 mg Oral Daily Dasie Ellouise CROME, FNP   10 mg at 03/17/24 9087   gabapentin  (NEURONTIN ) capsule 600 mg  600 mg Oral TID Gottfried, Rhoda J, MD   600 mg at 03/17/24 9087   hydrOXYzine  (ATARAX ) tablet 25 mg  25 mg Oral TID PRN Euline Kimbler, Cletis, NP       magnesium  hydroxide (MILK OF MAGNESIA) suspension 30 mL  30 mL Oral Daily PRN Randall, Veronique M, NP       nicotine  (NICODERM CQ  - dosed in mg/24 hours)  patch 21 mg  21 mg Transdermal Daily Byungura, Veronique M, NP   21 mg at 03/17/24 0913   traZODone  (DESYREL ) tablet 100 mg  100 mg Oral QHS Randall Starlyn HERO, NP   100 mg at 03/16/24 2149   Current Outpatient Medications  Medication Sig Dispense Refill   albuterol  (VENTOLIN  HFA) 108 (90 Base) MCG/ACT inhaler Inhale 2 puffs into the lungs every 6 (six) hours as needed for wheezing or shortness of breath. 8 g 2   escitalopram  (LEXAPRO ) 10 MG tablet Take 1 tablet (10 mg total) by mouth daily. 30 tablet 0   gabapentin  (NEURONTIN ) 600 MG tablet Take 1 tablet (600 mg total) by mouth 3 (three) times daily. (Patient taking differently: Take 600 mg by mouth 3 (three) times daily as  needed (For anxiety).) 90 tablet 0   nicotine  (NICODERM CQ  - DOSED IN MG/24 HOURS) 21 mg/24hr patch Place 1 patch (21 mg total) onto the skin daily.     traZODone  (DESYREL ) 100 MG tablet Take 1 tablet (100 mg total) by mouth at bedtime as needed for sleep. 14 tablet 0    Labs  Lab Results:  Admission on 03/15/2024, Discharged on 03/15/2024  Component Date Value Ref Range Status   WBC 03/15/2024 7.9  4.0 - 10.5 K/uL Final   RBC 03/15/2024 5.11  4.22 - 5.81 MIL/uL Final   Hemoglobin 03/15/2024 15.8  13.0 - 17.0 g/dL Final   HCT 98/79/7973 47.6  39.0 - 52.0 % Final   MCV 03/15/2024 93.2  80.0 - 100.0 fL Final   MCH 03/15/2024 30.9  26.0 - 34.0 pg Final   MCHC 03/15/2024 33.2  30.0 - 36.0 g/dL Final   RDW 98/79/7973 14.4  11.5 - 15.5 % Final   Platelets 03/15/2024 278  150 - 400 K/uL Final   nRBC 03/15/2024 0.0  0.0 - 0.2 % Final   Neutrophils Relative % 03/15/2024 67  % Final   Neutro Abs 03/15/2024 5.3  1.7 - 7.7 K/uL Final   Lymphocytes Relative 03/15/2024 22  % Final   Lymphs Abs 03/15/2024 1.7  0.7 - 4.0 K/uL Final   Monocytes Relative 03/15/2024 9  % Final   Monocytes Absolute 03/15/2024 0.7  0.1 - 1.0 K/uL Final   Eosinophils Relative 03/15/2024 0  % Final   Eosinophils Absolute 03/15/2024 0.0  0.0 - 0.5  K/uL Final   Basophils Relative 03/15/2024 1  % Final   Basophils Absolute 03/15/2024 0.1  0.0 - 0.1 K/uL Final   Immature Granulocytes 03/15/2024 1  % Final   Abs Immature Granulocytes 03/15/2024 0.04  0.00 - 0.07 K/uL Final   Performed at [facility], [address], [place], [place] [address]   Sodium 03/15/2024 138  135 - 145 mmol/L Final   Potassium 03/15/2024 4.3  3.5 - 5.1 mmol/L Final   Chloride 03/15/2024 98  98 - 111 mmol/L Final   CO2 03/15/2024 29  22 - 32 mmol/L Final   Glucose, Bld 03/15/2024 82  70 - 99 mg/dL Final   Glucose reference range applies only to samples taken after fasting for at least 8 hours.   BUN 03/15/2024 11  6 - 20 mg/dL Final   Creatinine, Ser 03/15/2024 0.71  0.61 - 1.24 mg/dL Final   Calcium  03/15/2024 9.8  8.9 - 10.3 mg/dL Final   Total Protein 98/79/7973 8.2 (H)  6.5 - 8.1 g/dL Final   Albumin 98/79/7973 4.7  3.5 - 5.0 g/dL Final   AST 98/79/7973 31  15 - 41 U/L Final   ALT 03/15/2024 23  0 - 44 U/L Final   Alkaline Phosphatase 03/15/2024 91  38 - 126 U/L Final   Total Bilirubin 03/15/2024 0.9  0.0 - 1.2 mg/dL Final   GFR, Estimated 03/15/2024 >60  >60 mL/min Final   Comment: (NOTE) Calculated using the CKD-EPI Creatinine Equation (2021)    Anion gap 03/15/2024 11  5 - 15 Final   Performed at [facility], [address] [place], [place] [address]   Hgb A1c MFr Bld 03/15/2024 5.2  4.8 - 5.6 % Final   Comment: (NOTE) Diagnosis of Diabetes The following HbA1c ranges recommended by the American Diabetes Association (ADA) may be used as an aid in the diagnosis of diabetes mellitus.  Hemoglobin             Suggested A1C NGSP%              Diagnosis  <5.7                   Non Diabetic  5.7-6.4                Pre-Diabetic  >6.4                   Diabetic  <7.0                   Glycemic control for                       adults with diabetes.     Mean Plasma Glucose 03/15/2024 102.54  mg/dL Final   Performed at [facility], [address] [place], [place] [address]   Magnesium  03/15/2024 2.2  1.7 - 2.4 mg/dL Final   Performed at [facility], [address] [place], [place] [address]   Alcohol, Ethyl (B) 03/15/2024 <15  <15 mg/dL Final   Comment: (NOTE) For medical purposes only. Performed at [facility], [address] [place], [place] [address]    Cholesterol 03/15/2024 258 (H)  0 - 200 mg/dL Final   Comment:        ATP III CLASSIFICATION:  <200     mg/dL   Desirable  799-760  mg/dL   Borderline High  >=759    mg/dL   High           Triglycerides 03/15/2024 70  <150 mg/dL Final   HDL 98/79/7973 79  >40 mg/dL Final   Total CHOL/HDL Ratio 03/15/2024 3.3  RATIO Final   VLDL 03/15/2024 14  0 - 40 mg/dL Final   LDL Cholesterol 03/15/2024 165 (H)  0 - 99 mg/dL Final   Comment:        Total Cholesterol/HDL:CHD Risk Coronary Heart Disease Risk Table                     Men   Women  1/2 Average Risk   3.4   3.3  Average Risk       5.0   4.4  2 X Average Risk   9.6   7.1  3 X Average Risk  23.4   11.0        Use the calculated Patient Ratio above and the CHD Risk Table to determine the patient's CHD Risk.        ATP III CLASSIFICATION (LDL):  <100     mg/dL   Optimal  899-870  mg/dL   Near or Above                    Optimal  130-159  mg/dL   Borderline  839-810  mg/dL   High  >809     mg/dL   Very High Performed at [facility], [address] [place], [place] [address]    TSH 03/15/2024 1.450  0.350 - 4.500 uIU/mL Final   Performed at [facility], [address], [place], [place] [address]   Color, Urine 03/15/2024 YELLOW  YELLOW Final   APPearance 03/15/2024 CLEAR  CLEAR Final   Specific Gravity, Urine 03/15/2024 1.021  1.005 - 1.030 Final   pH 03/15/2024 7.0  5.0 -  8.0 Final   Glucose, UA 03/15/2024 NEGATIVE  NEGATIVE mg/dL Final   Hgb urine dipstick 03/15/2024 NEGATIVE  NEGATIVE Final   Bilirubin Urine 03/15/2024 NEGATIVE  NEGATIVE Final   Ketones,  ur 03/15/2024 20 (A)  NEGATIVE mg/dL Final   Protein, ur 98/79/7973 NEGATIVE  NEGATIVE mg/dL Final   Nitrite 98/79/7973 NEGATIVE  NEGATIVE Final   Leukocytes,Ua 03/15/2024 NEGATIVE  NEGATIVE Final   Performed at [facility], [address], [place], [place] [address]   POC Amphetamine UR 03/15/2024 None Detected  NONE DETECTED (Cut Off Level 1000 ng/mL) Final   POC Secobarbital (BAR) 03/15/2024 None Detected  NONE DETECTED (Cut Off Level 300 ng/mL) Final   POC Buprenorphine (BUP) 03/15/2024 None Detected  NONE DETECTED (Cut Off Level 10 ng/mL) Final   POC Oxazepam (BZO) 03/15/2024 None Detected  NONE DETECTED (Cut Off Level 300 ng/mL) Final   POC Cocaine UR 03/15/2024 None Detected  NONE DETECTED (Cut Off Level 300 ng/mL) Final   POC Methamphetamine UR 03/15/2024 None Detected  NONE DETECTED (Cut Off Level 1000 ng/mL) Final   POC Morphine 03/15/2024 None Detected  NONE DETECTED (Cut Off Level 300 ng/mL) Final   POC Methadone UR 03/15/2024 None Detected  NONE DETECTED (Cut Off Level 300 ng/mL) Final   POC Oxycodone  UR 03/15/2024 None Detected  NONE DETECTED (Cut Off Level 100 ng/mL) Final   POC Marijuana UR 03/15/2024 Positive (A)  NONE DETECTED (Cut Off Level 50 ng/mL) Final  Admission on 02/09/2024, Discharged on 02/09/2024  Component Date Value Ref Range Status   Sodium 02/09/2024 140  135 - 145 mmol/L Final   Potassium 02/09/2024 3.8  3.5 - 5.1 mmol/L Final   Chloride 02/09/2024 99  98 - 111 mmol/L Final   CO2 02/09/2024 24  22 - 32 mmol/L Final   Glucose, Bld 02/09/2024 90  70 - 99 mg/dL Final   Glucose reference range applies only to samples taken after fasting for at least 8 hours.   BUN 02/09/2024 5 (L)  6 - 20 mg/dL Final   Creatinine, Ser 02/09/2024 0.72  0.61 - 1.24 mg/dL Final   Calcium  02/09/2024 9.9  8.9 - 10.3 mg/dL Final   Total Protein 87/83/7974 8.7 (H)  6.5 - 8.1 g/dL Final   Albumin 87/83/7974 4.8  3.5 - 5.0 g/dL Final   AST 87/83/7974 25  15 - 41 U/L Final    ALT 02/09/2024 9  0 - 44 U/L Final   Alkaline Phosphatase 02/09/2024 85  38 - 126 U/L Final   Total Bilirubin 02/09/2024 0.3  0.0 - 1.2 mg/dL Final   GFR, Estimated 02/09/2024 >60  >60 mL/min Final   Comment: (NOTE) Calculated using the CKD-EPI Creatinine Equation (2021)    Anion gap 02/09/2024 16 (H)  5 - 15 Final   Performed at [facility], [address] [place], [place] [address]   Alcohol, Ethyl (B) 02/09/2024 87 (H)  <15 mg/dL Final   Comment: (NOTE) For medical purposes only. Performed at [facility], [address] [place], [place] [address]    WBC 02/09/2024 13.1 (H)  4.0 - 10.5 K/uL Final   RBC 02/09/2024 5.35  4.22 - 5.81 MIL/uL Final   Hemoglobin 02/09/2024 16.1  13.0 - 17.0 g/dL Final   HCT 87/83/7974 48.6  39.0 - 52.0 % Final   MCV 02/09/2024 90.8  80.0 - 100.0 fL Final   MCH 02/09/2024 30.1  26.0 - 34.0 pg Final   MCHC 02/09/2024  33.1  30.0 - 36.0 g/dL Final   RDW 87/83/7974 12.9  11.5 - 15.5 % Final   Platelets 02/09/2024 321  150 - 400 K/uL Final   nRBC 02/09/2024 0.0  0.0 - 0.2 % Final   Performed at [facility], [address] [place], [place] [address]   Opiates 02/09/2024 NEGATIVE  NEGATIVE Final   Cocaine 02/09/2024 NEGATIVE  NEGATIVE Final   Benzodiazepines 02/09/2024 NEGATIVE  NEGATIVE Final   Amphetamines 02/09/2024 NEGATIVE  NEGATIVE Final   Tetrahydrocannabinol 02/09/2024 POSITIVE (A)  NEGATIVE Final   Barbiturates 02/09/2024 NEGATIVE  NEGATIVE Final   Methadone Scn, Ur 02/09/2024 NEGATIVE  NEGATIVE Final   Fentanyl  02/09/2024 NEGATIVE  NEGATIVE Final   Comment: (NOTE) Drug screen is for Medical Purposes only. Positive results are preliminary only. If confirmation is needed, notify lab within 5 days.  Drug Class                 Cutoff (ng/mL) Amphetamine and metabolites 1000 Barbiturate and metabolites 200 Benzodiazepine              200 Opiates and metabolites     300 Cocaine and  metabolites     300 THC                         50 Fentanyl                     5 Methadone                   300  Trazodone  is metabolized in vivo to several metabolites,  including pharmacologically active m-CPP, which is excreted in the  urine.  Immunoassay screens for amphetamines and MDMA have potential  cross-reactivity with these compounds and may provide false positive  result.  Performed at [facility], [address] [place], [place] [address]   Clinical Support on 01/06/2024  Component Date Value Ref Range Status   Hepatitis B Surface Ag 01/06/2024 Non Reactive   Final   Hepatitis B Surface Ag 01/06/2024 Non Reactive   Final   Hep B Core Total Ab 01/06/2024 Non Reactive   Final  Admission on 11/20/2023, Discharged on 11/21/2023  Component Date Value Ref Range Status   Sodium 11/20/2023 139  135 - 145 mmol/L Final   Potassium 11/20/2023 3.4 (L)  3.5 - 5.1 mmol/L Final   Chloride 11/20/2023 105  98 - 111 mmol/L Final   CO2 11/20/2023 22  22 - 32 mmol/L Final   Glucose, Bld 11/20/2023 99  70 - 99 mg/dL Final   Glucose reference range applies only to samples taken after fasting for at least 8 hours.   BUN 11/20/2023 6  6 - 20 mg/dL Final   Creatinine, Ser 11/20/2023 0.65  0.61 - 1.24 mg/dL Final   Calcium  11/20/2023 8.5 (L)  8.9 - 10.3 mg/dL Final   Total Protein 90/73/7974 8.0  6.5 - 8.1 g/dL Final   Albumin 90/73/7974 3.7  3.5 - 5.0 g/dL Final   AST 90/73/7974 24  15 - 41 U/L Final   ALT 11/20/2023 20  0 - 44 U/L Final   Alkaline Phosphatase 11/20/2023 55  38 - 126 U/L Final   Total Bilirubin 11/20/2023 0.4  0.0 - 1.2 mg/dL Final   GFR, Estimated 11/20/2023 >60  >60 mL/min Final   Comment: (NOTE) Calculated using the CKD-EPI Creatinine Equation (2021)    Anion gap 11/20/2023  12  5 - 15 Final   Performed at [facility], [address] [place], [place] [address]   Alcohol, Ethyl (B) 11/20/2023 159 (H)  <15 mg/dL Final   Comment:  (NOTE) For medical purposes only. Performed at [facility], [address] [place], [place] [address]    WBC 11/20/2023 7.9  4.0 - 10.5 K/uL Final   RBC 11/20/2023 4.68  4.22 - 5.81 MIL/uL Final   Hemoglobin 11/20/2023 13.9  13.0 - 17.0 g/dL Final   HCT 90/73/7974 42.5  39.0 - 52.0 % Final   MCV 11/20/2023 90.8  80.0 - 100.0 fL Final   MCH 11/20/2023 29.7  26.0 - 34.0 pg Final   MCHC 11/20/2023 32.7  30.0 - 36.0 g/dL Final   RDW 90/73/7974 15.1  11.5 - 15.5 % Final   Platelets 11/20/2023 284  150 - 400 K/uL Final   nRBC 11/20/2023 0.0  0.0 - 0.2 % Final   Neutrophils Relative % 11/20/2023 57  % Final   Neutro Abs 11/20/2023 4.6  1.7 - 7.7 K/uL Final   Lymphocytes Relative 11/20/2023 34  % Final   Lymphs Abs 11/20/2023 2.7  0.7 - 4.0 K/uL Final   Monocytes Relative 11/20/2023 7  % Final   Monocytes Absolute 11/20/2023 0.5  0.1 - 1.0 K/uL Final   Eosinophils Relative 11/20/2023 1  % Final   Eosinophils Absolute 11/20/2023 0.1  0.0 - 0.5 K/uL Final   Basophils Relative 11/20/2023 1  % Final   Basophils Absolute 11/20/2023 0.1  0.0 - 0.1 K/uL Final   Immature Granulocytes 11/20/2023 0  % Final   Abs Immature Granulocytes 11/20/2023 0.03  0.00 - 0.07 K/uL Final   Performed at [facility], [address] [place], [place] [address]  Admission on 11/12/2023, Discharged on 11/12/2023  Component Date Value Ref Range Status   WBC 11/12/2023 8.7  4.0 - 10.5 K/uL Final   RBC 11/12/2023 4.33  4.22 - 5.81 MIL/uL Final   Hemoglobin 11/12/2023 13.4  13.0 - 17.0 g/dL Final   HCT 90/81/7974 40.4  39.0 - 52.0 % Final   MCV 11/12/2023 93.3  80.0 - 100.0 fL Final   MCH 11/12/2023 30.9  26.0 - 34.0 pg Final   MCHC 11/12/2023 33.2  30.0 - 36.0 g/dL Final   RDW 90/81/7974 14.8  11.5 - 15.5 % Final   Platelets 11/12/2023 389  150 - 400 K/uL Final   nRBC 11/12/2023 0.0  0.0 - 0.2 % Final   Neutrophils Relative % 11/12/2023 55  % Final   Neutro Abs 11/12/2023 5.0  1.7 - 7.7 K/uL  Final   Lymphocytes Relative 11/12/2023 33  % Final   Lymphs Abs 11/12/2023 2.8  0.7 - 4.0 K/uL Final   Monocytes Relative 11/12/2023 9  % Final   Monocytes Absolute 11/12/2023 0.7  0.1 - 1.0 K/uL Final   Eosinophils Relative 11/12/2023 1  % Final   Eosinophils Absolute 11/12/2023 0.1  0.0 - 0.5 K/uL Final   Basophils Relative 11/12/2023 1  % Final   Basophils Absolute 11/12/2023 0.1  0.0 - 0.1 K/uL Final   WBC Morphology 11/12/2023 See Note   Final   Mild Left Shift (1-5% metas, occ myelo)   RBC Morphology 11/12/2023 See Note   Final   MIXED RBC POPULATION   Smear Review 11/12/2023 Normal platelet morphology   Final   Immature Granulocytes 11/12/2023 1  % Final   Abs Immature Granulocytes 11/12/2023 0.06  0.00 -  0.07 K/uL Final   Performed at [facility], [address] [place], [place] [address]   Sodium 11/12/2023 140  135 - 145 mmol/L Final   Potassium 11/12/2023 3.3 (L)  3.5 - 5.1 mmol/L Final   Chloride 11/12/2023 107  98 - 111 mmol/L Final   CO2 11/12/2023 24  22 - 32 mmol/L Final   Glucose, Bld 11/12/2023 98  70 - 99 mg/dL Final   Glucose reference range applies only to samples taken after fasting for at least 8 hours.   BUN 11/12/2023 9  6 - 20 mg/dL Final   Creatinine, Ser 11/12/2023 0.70  0.61 - 1.24 mg/dL Final   Calcium  11/12/2023 8.2 (L)  8.9 - 10.3 mg/dL Final   GFR, Estimated 11/12/2023 >60  >60 mL/min Final   Comment: (NOTE) Calculated using the CKD-EPI Creatinine Equation (2021)    Anion gap 11/12/2023 9  5 - 15 Final   Performed at [facility], [address] [place], [place] [address]   Alcohol, Ethyl (B) 11/12/2023 140 (H)  <15 mg/dL Final   Comment: (NOTE) For medical purposes only. Performed at [facility], [address] [place], [place] [address]    Acetaminophen  (Tylenol ), Serum 11/12/2023 <10 (L)  10 - 30 ug/mL Final   Comment: (NOTE) Therapeutic concentrations vary significantly. A range of 10-30 ug/mL  may be an  effective concentration for many patients. However, some  are best treated at concentrations outside of this range. Acetaminophen  concentrations >150 ug/mL at 4 hours after ingestion  and >50 ug/mL at 12 hours after ingestion are often associated with  toxic reactions.  Performed at [facility], [address] [place], [place] [address]    Salicylate Lvl 11/12/2023 <7.0 (L)  7.0 - 30.0 mg/dL Final   Performed at [facility], [address] [place], [place] [address]   Troponin I (High Sensitivity) 11/12/2023 4  <18 ng/L Final   Comment: (NOTE) Elevated high sensitivity troponin I (hsTnI) values and significant  changes across serial measurements may suggest ACS but many other  chronic and acute conditions are known to elevate hsTnI results.  Refer to the Links section for chest pain algorithms and additional  guidance. Performed at [facility], [address] [place], [place] [address]    Tricyclic, Ur Screen 11/12/2023 NONE DETECTED  NONE DETECTED Final   Amphetamines, Ur Screen 11/12/2023 NONE DETECTED  NONE DETECTED Final   MDMA (Ecstasy)Ur Screen 11/12/2023 NONE DETECTED  NONE DETECTED Final   Cocaine Metabolite,Ur [place] 11/12/2023 NONE DETECTED  NONE DETECTED Final   Opiate, Ur Screen 11/12/2023 NONE DETECTED  NONE DETECTED Final   Phencyclidine (PCP) Ur S 11/12/2023 NONE DETECTED  NONE DETECTED Final   Cannabinoid 50 Ng, Ur [place] 11/12/2023 POSITIVE (A)  NONE DETECTED Final   Barbiturates, Ur Screen 11/12/2023 NONE DETECTED  NONE DETECTED Final   Benzodiazepine, Ur Scrn 11/12/2023 NONE DETECTED  NONE DETECTED Final   Methadone Scn, Ur 11/12/2023 NONE DETECTED  NONE DETECTED Final   Comment: (NOTE) Tricyclics + metabolites, urine    Cutoff 1000 ng/mL Amphetamines + metabolites, urine  Cutoff 1000 ng/mL MDMA (Ecstasy), urine              Cutoff 500 ng/mL Cocaine Metabolite, urine          Cutoff 300 ng/mL Opiate + metabolites, urine         Cutoff 300 ng/mL Phencyclidine (PCP), urine  Cutoff 25 ng/mL Cannabinoid, urine                 Cutoff 50 ng/mL Barbiturates + metabolites, urine  Cutoff 200 ng/mL Benzodiazepine, urine              Cutoff 200 ng/mL Methadone, urine                   Cutoff 300 ng/mL  The urine drug screen provides only a preliminary, unconfirmed analytical test result and should not be used for non-medical purposes. Clinical consideration and professional judgment should be applied to any positive drug screen result due to possible interfering substances. A more specific alternate chemical method must be used in order to obtain a confirmed analytical result. Gas chromatography / mass spectrometry (GC/MS) is the preferred confirm                          atory method. Performed at [facility], [address] [place], [place] [address]    Color, Urine 11/12/2023 YELLOW (A)  YELLOW Final   APPearance 11/12/2023 CLEAR (A)  CLEAR Final   Specific Gravity, Urine 11/12/2023 1.019  1.005 - 1.030 Final   pH 11/12/2023 5.0  5.0 - 8.0 Final   Glucose, UA 11/12/2023 NEGATIVE  NEGATIVE mg/dL Final   Hgb urine dipstick 11/12/2023 NEGATIVE  NEGATIVE Final   Bilirubin Urine 11/12/2023 NEGATIVE  NEGATIVE Final   Ketones, ur 11/12/2023 NEGATIVE  NEGATIVE mg/dL Final   Protein, ur 90/81/7974 NEGATIVE  NEGATIVE mg/dL Final   Nitrite 90/81/7974 NEGATIVE  NEGATIVE Final   Leukocytes,Ua 11/12/2023 NEGATIVE  NEGATIVE Final   Performed at [facility], [address] [place], [place] [address]   Troponin I (High Sensitivity) 11/12/2023 4  <18 ng/L Final   Comment: (NOTE) Elevated high sensitivity troponin I (hsTnI) values and significant  changes across serial measurements may suggest ACS but many other  chronic and acute conditions are known to elevate hsTnI results.  Refer to the Links section for chest pain algorithms and additional  guidance. Performed at [facility], [address] [place], [place] [address]    B Natriuretic Peptide 11/12/2023 6.2  0.0 - 100.0 pg/mL Final   Performed at [facility], [address] [place], [place] [address]  Admission on 11/06/2023, Discharged on 11/10/2023  Component Date Value Ref Range Status   SARS Coronavirus 2 by RT PCR 11/09/2023 NEGATIVE  NEGATIVE Final   Comment: (NOTE) SARS-CoV-2 target nucleic acids are NOT DETECTED.  The SARS-CoV-2 RNA is generally detectable in upper respiratory specimens during the acute phase of infection. The lowest concentration of SARS-CoV-2 viral copies this assay can detect is 138 copies/mL. A negative result does not preclude SARS-Cov-2 infection and should not be used as the sole basis for treatment or other patient management decisions. A negative result may occur with  improper specimen collection/handling, submission of specimen other than nasopharyngeal swab, presence of viral mutation(s) within the areas targeted by this assay, and inadequate number of viral copies(<138 copies/mL). A negative result must be combined with clinical observations, patient history, and epidemiological information. The expected result is Negative.  Fact Sheet for Patients:  bloggercourse.com  Fact Sheet for Healthcare Providers:  seriousbroker.it  This test is no                          t yet approved or cleared by  the [place]  FDA and  has been authorized for detection and/or diagnosis of SARS-CoV-2 by FDA under an Emergency Use Authorization (EUA). This EUA will remain  in effect (meaning this test can be used) for the duration of the COVID-19 declaration under Section 564(b)(1) of the Act, 21 U.S.C.section 360bbb-3(b)(1), unless the authorization is terminated  or revoked sooner.       Influenza A by PCR 11/09/2023 NEGATIVE  NEGATIVE Final   Influenza B by PCR 11/09/2023 NEGATIVE  NEGATIVE Final   Comment:  (NOTE) The Xpert Xpress SARS-CoV-2/FLU/RSV plus assay is intended as an aid in the diagnosis of influenza from Nasopharyngeal swab specimens and should not be used as a sole basis for treatment. Nasal washings and aspirates are unacceptable for Xpert Xpress SARS-CoV-2/FLU/RSV testing.  Fact Sheet for Patients: bloggercourse.com  Fact Sheet for Healthcare Providers: seriousbroker.it  This test is not yet approved or cleared by the [place]  FDA and has been authorized for detection and/or diagnosis of SARS-CoV-2 by FDA under an Emergency Use Authorization (EUA). This EUA will remain in effect (meaning this test can be used) for the duration of the COVID-19 declaration under Section 564(b)(1) of the Act, 21 U.S.C. section 360bbb-3(b)(1), unless the authorization is terminated or revoked.     Resp Syncytial Virus by PCR 11/09/2023 NEGATIVE  NEGATIVE Final   Comment: (NOTE) Fact Sheet for Patients: bloggercourse.com  Fact Sheet for Healthcare Providers: seriousbroker.it  This test is not yet approved or cleared by the [place]  FDA and has been authorized for detection and/or diagnosis of SARS-CoV-2 by FDA under an Emergency Use Authorization (EUA). This EUA will remain in effect (meaning this test can be used) for the duration of the COVID-19 declaration under Section 564(b)(1) of the Act, 21 U.S.C. section 360bbb-3(b)(1), unless the authorization is terminated or revoked.  Performed at [facility], [address] [place], [place] [address]   Admission on 11/05/2023, Discharged on 11/06/2023  Component Date Value Ref Range Status   Sodium 11/05/2023 139  135 - 145 mmol/L Final   Potassium 11/05/2023 3.8  3.5 - 5.1 mmol/L Final   Chloride 11/05/2023 101  98 - 111 mmol/L Final   CO2 11/05/2023 27  22 - 32 mmol/L Final   Glucose, Bld 11/05/2023 79  70  - 99 mg/dL Final   Glucose reference range applies only to samples taken after fasting for at least 8 hours.   BUN 11/05/2023 <5 (L)  6 - 20 mg/dL Final   Creatinine, Ser 11/05/2023 0.59 (L)  0.61 - 1.24 mg/dL Final   Calcium  11/05/2023 9.7  8.9 - 10.3 mg/dL Final   Total Protein 90/88/7974 9.0 (H)  6.5 - 8.1 g/dL Final   Albumin 90/88/7974 3.8  3.5 - 5.0 g/dL Final   AST 90/88/7974 24  15 - 41 U/L Final   ALT 11/05/2023 11  0 - 44 U/L Final   Alkaline Phosphatase 11/05/2023 89  38 - 126 U/L Final   Total Bilirubin 11/05/2023 0.3  0.0 - 1.2 mg/dL Final   GFR, Estimated 11/05/2023 >60  >60 mL/min Final   Comment: (NOTE) Calculated using the CKD-EPI Creatinine Equation (2021)    Anion gap 11/05/2023 12  5 - 15 Final   Performed at [facility], [address], [place], [place] [address]   Alcohol, Ethyl (B) 11/05/2023 192 (H)  <15 mg/dL Final   Comment: (NOTE) For medical purposes only. Performed at [facility], [address]  [place], [place] [address]    Opiates 11/05/2023 NEGATIVE  NEGATIVE Final   Cocaine 11/05/2023 NEGATIVE  NEGATIVE Final   Benzodiazepines 11/05/2023 NEGATIVE  NEGATIVE Final   Amphetamines 11/05/2023 NEGATIVE  NEGATIVE Final   Tetrahydrocannabinol 11/05/2023 NEGATIVE  NEGATIVE Final   Barbiturates 11/05/2023 NEGATIVE  NEGATIVE Final   Methadone Scn, Ur 11/05/2023 NEGATIVE  NEGATIVE Final   Fentanyl  11/05/2023 NEGATIVE  NEGATIVE Final   Comment: (NOTE) Drug screen is for Medical Purposes only. Positive results are preliminary only. If confirmation is needed, notify lab within 5 days.  Drug Class                 Cutoff (ng/mL) Amphetamine and metabolites 1000 Barbiturate and metabolites 200 Benzodiazepine              200 Opiates and metabolites     300 Cocaine and metabolites     300 THC                         50 Fentanyl                     5 Methadone                   300  Trazodone  is metabolized in vivo to  several metabolites,  including pharmacologically active m-CPP, which is excreted in the  urine.  Immunoassay screens for amphetamines and MDMA have potential  cross-reactivity with these compounds and may provide false positive  result.  Performed at [facility], [address], [place], [place] [address]    WBC 11/05/2023 12.5 (H)  4.0 - 10.5 K/uL Final   RBC 11/05/2023 4.90  4.22 - 5.81 MIL/uL Final   Hemoglobin 11/05/2023 14.2  13.0 - 17.0 g/dL Final   HCT 90/88/7974 45.6  39.0 - 52.0 % Final   MCV 11/05/2023 93.1  80.0 - 100.0 fL Final   MCH 11/05/2023 29.0  26.0 - 34.0 pg Final   MCHC 11/05/2023 31.1  30.0 - 36.0 g/dL Final   RDW 90/88/7974 14.5  11.5 - 15.5 % Final   Platelets 11/05/2023 464 (H)  150 - 400 K/uL Final   nRBC 11/05/2023 0.0  0.0 - 0.2 % Final   Neutrophils Relative % 11/05/2023 69  % Final   Neutro Abs 11/05/2023 8.6 (H)  1.7 - 7.7 K/uL Final   Lymphocytes Relative 11/05/2023 23  % Final   Lymphs Abs 11/05/2023 2.8  0.7 - 4.0 K/uL Final   Monocytes Relative 11/05/2023 6  % Final   Monocytes Absolute 11/05/2023 0.8  0.1 - 1.0 K/uL Final   Eosinophils Relative 11/05/2023 0  % Final   Eosinophils Absolute 11/05/2023 0.1  0.0 - 0.5 K/uL Final   Basophils Relative 11/05/2023 1  % Final   Basophils Absolute 11/05/2023 0.1  0.0 - 0.1 K/uL Final   Immature Granulocytes 11/05/2023 1  % Final   Abs Immature Granulocytes 11/05/2023 0.11 (H)  0.00 - 0.07 K/uL Final   Performed at [facility], [address], [place], [place] [address]   Acetaminophen  (Tylenol ), Serum 11/05/2023 <10 (L)  10 - 30 ug/mL Final   Comment: (NOTE) Toxic concentrations can be more effectively related to post dose interval; > 200, > 100, and > 50 ug/mL serum concentrations correspond to toxic concentrations at 4, 8, and 12 hours post dose, respectively.  Performed at [facility], [address]  [place], [place] [address]     Salicylate Lvl 11/05/2023 <7.0 (L)  7.0 - 30.0 mg/dL Final   Performed at [facility], [address], [place], [place] [address]   Troponin T High Sensitivity 11/05/2023 <15  0 - 19 ng/L Final   Comment: (NOTE) Biotin concentrations > 1000 ng/mL falsely decrease TnT results.  Serial cardiac troponin measurements are suggested.  Refer to the Links section for chest pain algorithms and additional  guidance. Performed at [facility], [address], [place], [place] [address]     Blood Alcohol level:  Lab Results  Component Value Date   ETH <15 03/15/2024   ETH 87 (H) 02/09/2024    Metabolic Disorder Labs: Lab Results  Component Value Date   HGBA1C 5.2 03/15/2024   MPG 102.54 03/15/2024   MPG 102.54 08/07/2023   No results found for: PROLACTIN Lab Results  Component Value Date   CHOL 258 (H) 03/15/2024   TRIG 70 03/15/2024   HDL 79 03/15/2024   CHOLHDL 3.3 03/15/2024   VLDL 14 03/15/2024   LDLCALC 165 (H) 03/15/2024   LDLCALC 197 (H) 08/07/2023    Therapeutic Lab Levels: No results found for: LITHIUM Lab Results  Component Value Date   VALPROATE 40 (L) 10/07/2019   VALPROATE 84 09/12/2019   No results found for: CBMZ  Physical Findings   AUDIT    Flowsheet Row [facility]) [facility] Ad[facility] (Discharged) from 09/09/2019 in [facility]  Alcohol Use Disorder Identification Test Final Score (AUDIT) 13 6   GAD-7    Flowsheet Row Integrated Behavioral Health [facility]19[facility]  Total GAD-7 Score 5   PHQ2-9    Flowsheet Row ED from 03/15/2024 in [facility] Most recent reading at 03/16/2024  4:14 PM ED from 03/15/2024 in [facility] Most recent reading at 03/15/2024 11:32 AM ED from 02/09/2024 in [facility] Most recent  reading at 02/12/2024 10:02 AM ED from 08/05/2023 in [facility] Most recent reading at 08/09/2023 10:05 AM Integrated Behavioral Health from 07/19/2019 [facility] Most recent reading at 07/19/2019  3:18 PM  PHQ-2 Total Score 0 4 6 2 4   PHQ-9 Total Score 7 12 11 7 10    Flowsheet Row ED from 03/15/2024 in [facility] Most recent reading at 03/15/2024  1:31 PM ED from 03/15/2024 in [facility] Most recent reading at 03/15/2024 10:23 AM ED from 02/09/2024 in [facility] Most recent reading at 02/11/2024 11:43 AM  C-SSRS RISK CATEGORY No Risk High Risk No Risk     Musculoskeletal  Strength & Muscle Tone: within normal limits Gait & Station: normal Patient leans: N/A  Psychiatric Specialty Exam  Presentation  General Appearance:  Appropriate for Environment; Well Groomed  Eye Contact: Good  Speech: Clear and Coherent; Normal Rate  Speech Volume: Normal  Handedness: Right   Mood and Affect  Mood: Euthymic  Affect: Appropriate; Congruent   Thought Process  Thought Processes: Coherent; Goal Directed  Descriptions of Associations:Intact  Orientation:Full (Time, Place and Person)  Thought Content:Logical; WDL  Diagnosis of Schizophrenia or Schizoaffective disorder in past: No    Hallucinations:Hallucinations: None  Ideas of Reference:None  Suicidal Thoughts:Suicidal Thoughts: No  Homicidal Thoughts:Homicidal Thoughts: No   Sensorium  Memory: Immediate Good  Judgment: Intact  Insight: Present   Executive Functions  Concentration: Good  Attention Span: Good  Recall: Good  Fund of Knowledge: Good  Language: Good   Psychomotor Activity  Psychomotor Activity: Psychomotor Activity: Normal   Assets  Assets: Communication Skills; Desire for Improvement; Physical Health; Social Support   Sleep   Sleep: Sleep: Good  Estimated Sleeping Duration (Last 24 Hours): 8.50-9.00 hours  No data recorded  Physical Exam  Physical Exam Vitals and nursing note reviewed.  Constitutional:      Appearance: Normal appearance.  HENT:     Head: Normocephalic.     Nose: Nose normal.  Cardiovascular:     Rate and Rhythm: Normal rate.  Pulmonary:     Effort: Pulmonary effort is normal.  Musculoskeletal:        General: Normal range of motion.     Cervical back: Normal range of motion.  Neurological:     Mental Status: He is alert and oriented to person, place, and time.  Psychiatric:        Attention and Perception: Attention normal. He does not perceive auditory or visual hallucinations.        Mood and Affect: Mood and affect normal.        Speech: Speech normal.        Behavior: Behavior normal. Behavior is cooperative.        Thought Content: Thought content normal. Thought content is not paranoid or delusional. Thought content does not include homicidal or suicidal ideation. Thought content does not include homicidal or suicidal plan.        Cognition and Memory: Cognition normal.        Judgment: Judgment normal.    Review of Systems  Constitutional: Negative.   HENT: Negative.    Eyes: Negative.   Respiratory: Negative.    Cardiovascular: Negative.   Gastrointestinal: Negative.   Genitourinary: Negative.   Musculoskeletal: Negative.   Skin: Negative.   Neurological: Negative.   Endo/Heme/Allergies: Negative.   Psychiatric/Behavioral:  Positive for substance abuse. Negative for depression, hallucinations, memory loss and suicidal ideas. The patient is nervous/anxious. The patient does not have insomnia.    Blood pressure 103/67, pulse 88, temperature 97.6 F (36.4 C), temperature source Oral, resp. rate 18, SpO2 92%. There is no height or weight on file to calculate BMI.  Treatment Plan Summary: Daily contact with patient to assess and evaluate symptoms and progress in  treatment and Medication management  Labs results reviewed from 03/15/2024: -CMP with GFR within normal ranges besides mildly elevated total protein of 8.2 -lipid panel within normal ranges besides elevated cholesterol at 258 and LDL of 165 (education provided about limiting greasy/fried foods, adding more veggies to diet, and exercising to help reduce levels) -CBC with differential within normal ranges -glucose and hemoglobin A1C normal -TSH normal -UDS positive for marijuana -BAL <15  Medication Management: -Continue tylenol  650 mg by mouth every 6 hours PRN for mild pain -Continue albuterol  108 (90 base) inhaler, 2 puffs via inhalation every 6 hours PRN for wheezing or shortness of breath -Continue maalox/mylanta 30 mL by mouth every 4 hours PRN for indigestion -Start lexapro  10 mg by mouth daily for mood -Continue gabapentin  600 mg tid by mouth for neuropathy/alcohol dependence -Continue agitation protocol (see MAR) -Start hydroxyzine  25 mg by mouth tid PRN for anxiety -Continue milk of magnesia 30 mL by mouth daily PRN for mild constipation -Continue nicotine  21 mg transdermal patch daily for nicotine  dependence -Continue trazodone  100 mg by mouth at bedtime  for sleep  Continue CIWA assessments every 4 hours for monitor for alcohol related withdrawal symptoms  Disposition: Tentative discharge date of tomorrow if he is accepted to an [facility]. He has a phone interview scheduled with them at 6 pm today. Otherwise, tentative date for Monday, January 26th, 2026. He continues to express interest in attending a long term residential rehab program like an [address]  Discharge Planning: Social work and case management to assist with discharge planning and identification of hospital follow-up needs prior to discharge Estimated LOS: 3-5 days Discharge Concerns: Need to establish a safety plan; Medication compliance and effectiveness Discharge Goals: Return home with outpatient  referrals for mental health follow-up including medication management/psychotherapy   Recommendations  Continue admission to the [facility] and will wok with CSW to gain access to substance abuse rehab. Discharge date will be determined based on progress.   I certify that inpatient services furnished can reasonably be expected to improve the patient's condition.    Kiara Mcdowell, NP 03/17/2024 9:55 AM

## 2024-03-17 NOTE — Group Note (Signed)
 Group Topic: Recovery Basics  Group Date: 03/17/2024 Start Time: 1300 End Time: 1400 Facilitators: Nicholaus Arlyne BIRCH, NT  Department: [facility]  Number of Participants: 3  Group Focus: daily focus Treatment Modality:  Psychoeducation Interventions utilized were patient education Purpose: relapse prevention strategies  Name: John [name] Date of Birth: Dec 23, 1980  MR: [id number]    Level of Participation: active Quality of Participation: cooperative Interactions with others: gave feedback Mood/Affect: appropriate Triggers (if applicable): N/A Cognition: insightful Progress: Gaining insight Response: positive Plan: patient will be encouraged to attend groups  Patients Problems:  Patient Active Problem List   Diagnosis Date Noted   Polysubstance abuse (HCC) 03/15/2024   GAD (generalized anxiety disorder) 02/12/2024   Chest pain of uncertain etiology 02/05/2024   Alcohol use disorder, severe, dependence (HCC) 11/06/2023   Alcohol withdrawal (HCC) 08/23/2023   Tachycardia 08/23/2023   Alcohol dependence with uncomplicated withdrawal (HCC) 08/23/2023   MDD (major depressive disorder) 08/05/2023   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

## 2024-03-17 NOTE — ED Notes (Signed)
 Pt presents as pleasant and easy to engage. Pt c/o anxiety, says will take scheduled dose of Neurontin . Pt denies si hi and avh, verbal contract for safety provided. Pt reports feeling 'alright' and 'anxiety'. Pt ate breakfast, denies physical pain and discomforts, medications reviewed, questions denied.

## 2024-03-17 NOTE — ED Notes (Signed)
 Medications discussed with pt, questions denied. Pt presents as pleasant and social, engages in groups well.

## 2024-03-17 NOTE — ED Notes (Signed)
 Pt sleeping without interruption for distress or discomfort.  Safety maintained.

## 2024-03-17 NOTE — ED Notes (Signed)
 Pt sitting in dayroom interacting with peer and watching TV. No acute distress noted. Continue to monitor for safety.

## 2024-03-17 NOTE — Group Note (Signed)
 Group Topic: Understanding Self  Group Date: 03/17/2024 Start Time: 1200 End Time: 1230 Facilitators: Daved Tinnie HERO, RN  Department: [employer]  Number of Participants: 4  Group Focus: self-awareness Treatment Modality:  Psychoeducation Interventions utilized were patient education Purpose: increase insight  Name: John [name] Date of Birth: [birth year]  MR: [id number]    Level of Participation: moderate Quality of Participation: attentive and cooperative Interactions with others: gave feedback Mood/Affect: appropriate Triggers (if applicable): n/a Cognition: coherent/clear Progress: Gaining insight Response: pt says they will do more reflection, to help manage his emotions  Plan: patient will be encouraged to attend future education groups   Patients Problems:  Patient Active Problem List   Diagnosis Date Noted   Polysubstance abuse (HCC) 03/15/2024   GAD (generalized anxiety disorder) 02/12/2024   Chest pain of uncertain etiology 02/05/2024   Alcohol use disorder, severe, dependence (HCC) 11/06/2023   Alcohol withdrawal (HCC) 08/23/2023   Tachycardia 08/23/2023   Alcohol dependence with uncomplicated withdrawal (HCC) 08/23/2023   MDD (major depressive disorder) 08/05/2023   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

## 2024-03-18 DIAGNOSIS — F1994 Other psychoactive substance use, unspecified with psychoactive substance-induced mood disorder: Secondary | ICD-10-CM | POA: Diagnosis not present

## 2024-03-18 DIAGNOSIS — F1014 Alcohol abuse with alcohol-induced mood disorder: Secondary | ICD-10-CM | POA: Diagnosis not present

## 2024-03-18 DIAGNOSIS — Z5900 Homelessness unspecified: Secondary | ICD-10-CM | POA: Diagnosis not present

## 2024-03-18 DIAGNOSIS — F329 Major depressive disorder, single episode, unspecified: Secondary | ICD-10-CM | POA: Diagnosis not present

## 2024-03-18 MED ORDER — DOCUSATE SODIUM 100 MG PO CAPS: 100.0000 mg | ORAL_CAPSULE | Freq: Two times a day (BID) | ORAL | Status: DC | PRN

## 2024-03-18 MED ORDER — NALTREXONE HCL 50 MG PO TABS
50.0000 mg | ORAL_TABLET | Freq: Every day | ORAL | Status: DC
  Administered 2024-03-18 – 2024-03-19 (×2): 50 mg via ORAL
  Filled 2024-03-18 (×2): qty 1

## 2024-03-18 NOTE — Group Note (Signed)
 Group Topic: Relapse and Recovery  Group Date: 03/17/2024 Start Time: 1100 End Time: 1155 Facilitators: Gerome Jolly, NT  Department: [facility]  Number of Participants: 3  Group Focus: abuse issues and chemical dependency issues Treatment Modality:  Psychoeducation and Spiritual Interventions utilized were exploration, patient education, and story telling Purpose: enhance coping skills, explore maladaptive thinking, relapse prevention strategies, and trigger / craving management  Name: John [name] Date of Birth: 1980/05/21  MR: [id number]    Level of Participation: active Quality of Participation: attentive, cooperative, engaged, and supportive Interactions with others: gave feedback Mood/Affect: appropriate, brightens with interaction, and positive Triggers (if applicable): na Cognition: goal directed and manipulative Progress: Moderate Response: Pt actively participated and listened as other shared. Patient identified his triggers as money, his irrational thinking and being influenced by the wrong people.      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 Hx urethroplasty for urethral trauma in 2020 04/30/2019   Alcohol abuse with  intoxication 04/30/2019   Acute Femoral condyle fracture (HCC) 04/30/2019   Alcohol abuse 01/14/2018   Substance induced mood disorder (HCC) 01/14/2018

## 2024-03-18 NOTE — ED Notes (Signed)
 Pt sitting in dayroom watching television and interacting with peers. No acute distress noted. No concerns voiced. Informed pt to notify staff with any needs or assistance. Pt verbalized understanding and agreement. Will continue to monitor for safety.

## 2024-03-18 NOTE — Group Note (Unsigned)
 Group Topic: Wellness  Group Date: 03/18/2024 Start Time: 1600 End Time: 1630 Facilitators: Leron Charolette CROME, RN  Department: [facility]  Number of Participants: 4  Group Focus: nursing group Treatment Modality:  Cognitive Behavioral Therapy Interventions utilized were support Purpose: express feelings   Name: John [name] Date of Birth: [birth year]  MR: [id number]    Level of Participation: {THERAPIES; PSYCH GROUP PARTICIPATION OZCZO:76008} Quality of Participation: {THERAPIES; PSYCH QUALITY OF PARTICIPATION:23992} Interactions with others: {THERAPIES; PSYCH INTERACTIONS:23993} Mood/Affect: {THERAPIES; PSYCH MOOD/AFFECT:23994} Triggers (if applicable): *** Cognition: {THERAPIES; PSYCH COGNITION:23995} Progress: {THERAPIES; PSYCH PROGRESS:23997} Response: *** Plan: {THERAPIES; PSYCH EOJW:76003}  Patients Problems:  Patient Active Problem List   Diagnosis Date Noted   Polysubstance abuse (HCC) 03/15/2024   GAD (generalized anxiety disorder) 02/12/2024   Chest pain of uncertain etiology 02/05/2024   Alcohol use disorder, severe, dependence (HCC) 11/06/2023   Alcohol withdrawal (HCC) 08/23/2023   Tachycardia 08/23/2023   Alcohol dependence with uncomplicated withdrawal (HCC) 08/23/2023   MDD (major depressive disorder) 08/05/2023   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

## 2024-03-18 NOTE — ED Notes (Signed)
 Pt talking on phone in hallway, animated. He was watching TV and interacting with peers and staff. No acute distress noted. Continue to monitor for safety.

## 2024-03-18 NOTE — ED Notes (Signed)
 Attended [place] group.

## 2024-03-18 NOTE — ED Notes (Signed)
 Pt sleeping in bed, no acute distress noted. Respirations even and unlabored. Continue to monitor for safety.

## 2024-03-18 NOTE — Group Note (Signed)
 Group Topic: Emotional Regulation  Group Date: 03/18/2024 Start Time: 1330 End Time: 1400 Facilitators: Laneta Renea POUR, NT  Department: [facility]  Number of Participants: 4  Group Focus: coping skills and problem solving Treatment Modality:  Psychoeducation Interventions utilized were problem solving Purpose: enhance coping skills and relapse prevention strategies  Name: John [name] Date of Birth: [birth year]  MR: [id number]    Level of Participation: active Quality of Participation: cooperative and engaged Interactions with others: gave feedback Mood/Affect: appropriate and positive Triggers (if applicable): none Cognition: coherent/clear and insightful Progress: Gaining insight Response: I am going to rehabilitation, after this detox to get well Plan: patient will be encouraged to attend group.   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

## 2024-03-18 NOTE — Group Note (Signed)
 Group Topic: Wellness  Group Date: 03/18/2024 Start Time: 2030 End Time: 2100 Facilitators: Anice Benton LABOR, NT  Department: [facility]  Number of Participants: 4  Group Focus: check in Treatment Modality:  Individual Therapy and Solution-Focused Therapy Interventions utilized were mental fitness and support Purpose: express feelings and regain self-worth  Name: John [name] Date of Birth: August 04, 1980  MR: [id number]    Level of Participation: active Quality of Participation: cooperative Interactions with others: gave feedback Mood/Affect: appropriate Triggers (if applicable): N/A Cognition: insightful Progress: Moderate Response: Good Plan: follow-up needed  Patients Problems:  Patient Active Problem List   Diagnosis Date Noted   Polysubstance abuse (HCC) 03/15/2024   GAD (generalized anxiety disorder) 02/12/2024   Chest pain of uncertain etiology 02/05/2024   Alcohol use disorder, severe, dependence (HCC) 11/06/2023   Alcohol withdrawal (HCC) 08/23/2023   Tachycardia 08/23/2023   Alcohol dependence with uncomplicated withdrawal (HCC) 08/23/2023   MDD (major depressive disorder) 08/05/2023   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

## 2024-03-18 NOTE — ED Notes (Signed)
 Patient alert and oriented x4. He is calm and cooperative with staff. Patient denies SI/HI and denies AH/VH. He has been observed sitting in dayroom area watching TV. Patient compliant with medication as order by provider.

## 2024-03-18 NOTE — ED Provider Notes (Signed)
 Behavioral Health Progress Note  Date and Time: 03/18/2024 9:55 AM Name: John [name] MRN:  [id number]  HPI: By Starlyn NOVAK., NP on 03/15/2024: 44 year-old male who appears disheveled. He is anxious and restless, stating that he is tired of using substances and getting in trouble. He is alert and oriented and denying SI/HI/AVH. Speech is clear and thought process is coherent. Patient is preoccupied by housing issues but also states I want to start over, I want to detox and go to rehab... He reports that he experience difficulty abstaining from alcohol and drug use due to the environment he's is surrounded with. States this time he relapsed because everyone was using, its crazy there.   He reports that he uses about 3 liters of liquor every day. Uses cocaine and THC every day,  amount unknown I smoke a lot. Last use was last night.  Patient left the boarding house yesterday and stayed at [facility] but can not return there.  Patient denies experiencing withdrawal symptoms. No major distress noted.  When discussing about his multiple admissions, with no long term outcome, patient states this time I am ready, tired of living like this.  He denies current medical concerns. He reports that his appetite is good stating I am hungry. Reports sleeping well when he takes Trazodone .  Subjective: Patient is seen face-to-face by this provider and chart reviewed on 03/18/2024. On evaluation, patient is alert and oriented x 4. Reports mood is great and his energy level is excellent. Denies anxiety and depression this morning. Reports no alcohol related withdrawal symptoms and no objective signs observed on assessment. He reports alright sleep where he got up once during the night to use the bathroom, but was able to go back to sleep with no issues. Reports good appetite. He reports tolerating his medications well without any side effects. Reports interest in restarting his naltrexone , which he  reports previously taking 50 mg every other day instead of every day due to continued alcohol use. He reports that the naltrexone  helped him with alcohol cravings. Discussed naltrexone  uses, mechanism of action, side/adverse effects, and ongoing monitoring that'll occur with restarting him back on 50 mg of naltrexone  daily. He is agreeable to this and understands abstaining from use of all drugs and alcohol while taking his medications. Reports that he plans on completing his phone interview with the [facility] tonight at 6 pm and continues to display interest in attending a long-term residential rehab facility. Also recognizes that he'll need to not live in the past and take his medications regularly and as prescribed to abstain from alcohol and drug use. Reports that he has a follow-up appointment with his PCP French Blamer) on January 28th. Shares that his peer support specialist didn't come see him Wednesday or Thursday, but was told she'd provide him with further substance use treatment resources. Patient denies suicidal and homicidal ideations. Patient denies auditory and visual hallucinations. Patient denies paranoia.   Diagnosis:  Final diagnoses:  Substance induced mood disorder (HCC)  Polysubstance abuse (HCC)    Total Time spent with patient: 20 minutes  Past Psychiatric History: GAD, MDD, alcohol use disorder, polysubstance use disorder, substance-induced mood disorder, suicidal ideation Past Medical History:  Past Medical History:  Diagnosis Date   ASD (atrial septal defect)    Bacterial infection due to H. pylori    Hypertension    Kidney stones    Family History: None reported Family Psychiatric  History: Younger brother had hung himself  in a suicide attempt. Social History: Tamari is currently homeless in [place].  Most recently resided at a sober living facility [address] [facility].  He denies access to weapons. He receives SSI income.  He has court date  scheduled 03/30/2024.  Sleep: Good  Appetite:  Good  Current Medications:  Current Facility-Administered Medications  Medication Dose Route Frequency Provider Last Rate Last Admin   acetaminophen  (TYLENOL ) tablet 650 mg  650 mg Oral Q6H PRN Randall Starlyn HERO, NP   650 mg at 03/16/24 2149   albuterol  (VENTOLIN  HFA) 108 (90 Base) MCG/ACT inhaler 2 puff  2 puff Inhalation Q6H PRN Dasie Ellouise CROME, FNP       alum & mag hydroxide-simeth (MAALOX/MYLANTA) 200-200-20 MG/5ML suspension 30 mL  30 mL Oral Q4H PRN Randall, Veronique M, NP       haloperidol  (HALDOL ) tablet 5 mg  5 mg Oral TID PRN Randall Starlyn HERO, NP       And   diphenhydrAMINE  (BENADRYL ) capsule 50 mg  50 mg Oral TID PRN Randall Starlyn HERO, NP       haloperidol  lactate (HALDOL ) injection 5 mg  5 mg Intramuscular TID PRN Randall Starlyn HERO, NP       And   diphenhydrAMINE  (BENADRYL ) injection 50 mg  50 mg Intramuscular TID PRN Randall Starlyn HERO, NP       And   LORazepam  (ATIVAN ) injection 2 mg  2 mg Intramuscular TID PRN Randall Starlyn HERO, NP       haloperidol  lactate (HALDOL ) injection 10 mg  10 mg Intramuscular TID PRN Randall Starlyn HERO, NP       And   diphenhydrAMINE  (BENADRYL ) injection 50 mg  50 mg Intramuscular TID PRN Randall Starlyn HERO, NP       And   LORazepam  (ATIVAN ) injection 2 mg  2 mg Intramuscular TID PRN Randall, Veronique M, NP       docusate sodium  (COLACE) capsule 100 mg  100 mg Oral BID PRN Salayah Meares, NP       escitalopram  (LEXAPRO ) tablet 10 mg  10 mg Oral Daily Dasie Ellouise CROME, FNP   10 mg at 03/18/24 0913   gabapentin  (NEURONTIN ) capsule 600 mg  600 mg Oral TID Gottfried, Rhoda J, MD   600 mg at 03/18/24 9086   hydrOXYzine  (ATARAX ) tablet 25 mg  25 mg Oral TID PRN Syriah Delisi, NP   25 mg at 03/17/24 1504   magnesium  hydroxide (MILK OF MAGNESIA) suspension 30 mL  30 mL Oral Daily PRN Randall, Veronique M, NP       naltrexone  (DEPADE) tablet 50 mg  50 mg Oral Daily Siarra Gilkerson,  Aimar Shrewsbury, NP   50 mg at 03/18/24 1205   nicotine  (NICODERM CQ  - dosed in mg/24 hours) patch 21 mg  21 mg Transdermal Daily Byungura, Veronique M, NP   21 mg at 03/18/24 0913   traZODone  (DESYREL ) tablet 100 mg  100 mg Oral QHS Randall Starlyn HERO, NP   100 mg at 03/17/24 2106   Current Outpatient Medications  Medication Sig Dispense Refill   albuterol  (VENTOLIN  HFA) 108 (90 Base) MCG/ACT inhaler Inhale 2 puffs into the lungs every 6 (six) hours as needed for wheezing or shortness of breath. 8 g 2   escitalopram  (LEXAPRO ) 10 MG tablet Take 1 tablet (10 mg total) by mouth daily. 30 tablet 0   gabapentin  (NEURONTIN ) 600 MG tablet Take 1 tablet (600 mg total) by mouth 3 (three) times daily. (Patient taking  differently: Take 600 mg by mouth 3 (three) times daily as needed (For anxiety).) 90 tablet 0   nicotine  (NICODERM CQ  - DOSED IN MG/24 HOURS) 21 mg/24hr patch Place 1 patch (21 mg total) onto the skin daily.     traZODone  (DESYREL ) 100 MG tablet Take 1 tablet (100 mg total) by mouth at bedtime as needed for sleep. 14 tablet 0    Labs  Lab Results:  Admission on 03/15/2024, Discharged on 03/15/2024  Component Date Value Ref Range Status   WBC 03/15/2024 7.9  4.0 - 10.5 K/uL Final   RBC 03/15/2024 5.11  4.22 - 5.81 MIL/uL Final   Hemoglobin 03/15/2024 15.8  13.0 - 17.0 g/dL Final   HCT 98/79/7973 47.6  39.0 - 52.0 % Final   MCV 03/15/2024 93.2  80.0 - 100.0 fL Final   MCH 03/15/2024 30.9  26.0 - 34.0 pg Final   MCHC 03/15/2024 33.2  30.0 - 36.0 g/dL Final   RDW 98/79/7973 14.4  11.5 - 15.5 % Final   Platelets 03/15/2024 278  150 - 400 K/uL Final   nRBC 03/15/2024 0.0  0.0 - 0.2 % Final   Neutrophils Relative % 03/15/2024 67  % Final   Neutro Abs 03/15/2024 5.3  1.7 - 7.7 K/uL Final   Lymphocytes Relative 03/15/2024 22  % Final   Lymphs Abs 03/15/2024 1.7  0.7 - 4.0 K/uL Final   Monocytes Relative 03/15/2024 9  % Final   Monocytes Absolute 03/15/2024 0.7  0.1 - 1.0 K/uL Final    Eosinophils Relative 03/15/2024 0  % Final   Eosinophils Absolute 03/15/2024 0.0  0.0 - 0.5 K/uL Final   Basophils Relative 03/15/2024 1  % Final   Basophils Absolute 03/15/2024 0.1  0.0 - 0.1 K/uL Final   Immature Granulocytes 03/15/2024 1  % Final   Abs Immature Granulocytes 03/15/2024 0.04  0.00 - 0.07 K/uL Final   Performed at [facility], [address] [place], [place] [address]   Sodium 03/15/2024 138  135 - 145 mmol/L Final   Potassium 03/15/2024 4.3  3.5 - 5.1 mmol/L Final   Chloride 03/15/2024 98  98 - 111 mmol/L Final   CO2 03/15/2024 29  22 - 32 mmol/L Final   Glucose, Bld 03/15/2024 82  70 - 99 mg/dL Final   Glucose reference range applies only to samples taken after fasting for at least 8 hours.   BUN 03/15/2024 11  6 - 20 mg/dL Final   Creatinine, Ser 03/15/2024 0.71  0.61 - 1.24 mg/dL Final   Calcium  03/15/2024 9.8  8.9 - 10.3 mg/dL Final   Total Protein 98/79/7973 8.2 (H)  6.5 - 8.1 g/dL Final   Albumin 98/79/7973 4.7  3.5 - 5.0 g/dL Final   AST 98/79/7973 31  15 - 41 U/L Final   ALT 03/15/2024 23  0 - 44 U/L Final   Alkaline Phosphatase 03/15/2024 91  38 - 126 U/L Final   Total Bilirubin 03/15/2024 0.9  0.0 - 1.2 mg/dL Final   GFR, Estimated 03/15/2024 >60  >60 mL/min Final   Comment: (NOTE) Calculated using the CKD-EPI Creatinine Equation (2021)    Anion gap 03/15/2024 11  5 - 15 Final   Performed at [facility], [address] [place], [place] [address]   Hgb A1c MFr Bld 03/15/2024 5.2  4.8 - 5.6 % Final   Comment: (NOTE) Diagnosis of Diabetes The following HbA1c ranges recommended by the American Diabetes Association (ADA) may be  used as an aid in the diagnosis of diabetes mellitus.  Hemoglobin             Suggested A1C NGSP%              Diagnosis  <5.7                   Non Diabetic  5.7-6.4                Pre-Diabetic  >6.4                   Diabetic  <7.0                   Glycemic control for                       adults with  diabetes.     Mean Plasma Glucose 03/15/2024 102.54  mg/dL Final   Performed at [facility], [address], [place], [place] [address]   Magnesium  03/15/2024 2.2  1.7 - 2.4 mg/dL Final   Performed at [facility], [address] [place], [place] [address]   Alcohol, Ethyl (B) 03/15/2024 <15  <15 mg/dL Final   Comment: (NOTE) For medical purposes only. Performed at [facility], [address], [place], [place] [address]    Cholesterol 03/15/2024 258 (H)  0 - 200 mg/dL Final   Comment:        ATP III CLASSIFICATION:  <200     mg/dL   Desirable  799-760  mg/dL   Borderline High  >=759    mg/dL   High           Triglycerides 03/15/2024 70  <150 mg/dL Final   HDL 98/79/7973 79  >40 mg/dL Final   Total CHOL/HDL Ratio 03/15/2024 3.3  RATIO Final   VLDL 03/15/2024 14  0 - 40 mg/dL Final   LDL Cholesterol 03/15/2024 165 (H)  0 - 99 mg/dL Final   Comment:        Total Cholesterol/HDL:CHD Risk Coronary Heart Disease Risk Table                     Men   Women  1/2 Average Risk   3.4   3.3  Average Risk       5.0   4.4  2 X Average Risk   9.6   7.1  3 X Average Risk  23.4   11.0        Use the calculated Patient Ratio above and the CHD Risk Table to determine the patient's CHD Risk.        ATP III CLASSIFICATION (LDL):  <100     mg/dL   Optimal  899-870  mg/dL   Near or Above                    Optimal  130-159  mg/dL   Borderline  839-810  mg/dL   High  >809     mg/dL   Very High Performed at [facility], [address], [place], [place] [address]    TSH 03/15/2024 1.450  0.350 - 4.500 uIU/mL Final   Performed at [facility], [address] [place], [place] [address]   Color, Urine 03/15/2024 YELLOW  YELLOW Final   APPearance 03/15/2024 CLEAR  CLEAR Final   Specific Gravity, Urine 03/15/2024 1.021  1.005 -  1.030 Final   pH 03/15/2024 7.0  5.0 - 8.0 Final   Glucose, UA 03/15/2024 NEGATIVE  NEGATIVE mg/dL Final   Hgb urine dipstick  03/15/2024 NEGATIVE  NEGATIVE Final   Bilirubin Urine 03/15/2024 NEGATIVE  NEGATIVE Final   Ketones, ur 03/15/2024 20 (A)  NEGATIVE mg/dL Final   Protein, ur 98/79/7973 NEGATIVE  NEGATIVE mg/dL Final   Nitrite 98/79/7973 NEGATIVE  NEGATIVE Final   Leukocytes,Ua 03/15/2024 NEGATIVE  NEGATIVE Final   Performed at [facility], [address], [place], [place] [address]   POC Amphetamine UR 03/15/2024 None Detected  NONE DETECTED (Cut Off Level 1000 ng/mL) Final   POC Secobarbital (BAR) 03/15/2024 None Detected  NONE DETECTED (Cut Off Level 300 ng/mL) Final   POC Buprenorphine (BUP) 03/15/2024 None Detected  NONE DETECTED (Cut Off Level 10 ng/mL) Final   POC Oxazepam (BZO) 03/15/2024 None Detected  NONE DETECTED (Cut Off Level 300 ng/mL) Final   POC Cocaine UR 03/15/2024 None Detected  NONE DETECTED (Cut Off Level 300 ng/mL) Final   POC Methamphetamine UR 03/15/2024 None Detected  NONE DETECTED (Cut Off Level 1000 ng/mL) Final   POC Morphine 03/15/2024 None Detected  NONE DETECTED (Cut Off Level 300 ng/mL) Final   POC Methadone UR 03/15/2024 None Detected  NONE DETECTED (Cut Off Level 300 ng/mL) Final   POC Oxycodone  UR 03/15/2024 None Detected  NONE DETECTED (Cut Off Level 100 ng/mL) Final   POC Marijuana UR 03/15/2024 Positive (A)  NONE DETECTED (Cut Off Level 50 ng/mL) Final  Admission on 02/09/2024, Discharged on 02/09/2024  Component Date Value Ref Range Status   Sodium 02/09/2024 140  135 - 145 mmol/L Final   Potassium 02/09/2024 3.8  3.5 - 5.1 mmol/L Final   Chloride 02/09/2024 99  98 - 111 mmol/L Final   CO2 02/09/2024 24  22 - 32 mmol/L Final   Glucose, Bld 02/09/2024 90  70 - 99 mg/dL Final   Glucose reference range applies only to samples taken after fasting for at least 8 hours.   BUN 02/09/2024 5 (L)  6 - 20 mg/dL Final   Creatinine, Ser 02/09/2024 0.72  0.61 - 1.24 mg/dL Final   Calcium  02/09/2024 9.9  8.9 - 10.3 mg/dL Final   Total Protein 87/83/7974 8.7 (H)  6.5 - 8.1  g/dL Final   Albumin 87/83/7974 4.8  3.5 - 5.0 g/dL Final   AST 87/83/7974 25  15 - 41 U/L Final   ALT 02/09/2024 9  0 - 44 U/L Final   Alkaline Phosphatase 02/09/2024 85  38 - 126 U/L Final   Total Bilirubin 02/09/2024 0.3  0.0 - 1.2 mg/dL Final   GFR, Estimated 02/09/2024 >60  >60 mL/min Final   Comment: (NOTE) Calculated using the CKD-EPI Creatinine Equation (2021)    Anion gap 02/09/2024 16 (H)  5 - 15 Final   Performed at [facility], [address], [place], [place] [address]   Alcohol, Ethyl (B) 02/09/2024 87 (H)  <15 mg/dL Final   Comment: (NOTE) For medical purposes only. Performed at [facility], [address], [place], [place] [address]    WBC 02/09/2024 13.1 (H)  4.0 - 10.5 K/uL Final   RBC 02/09/2024 5.35  4.22 - 5.81 MIL/uL Final   Hemoglobin 02/09/2024 16.1  13.0 - 17.0 g/dL Final   HCT 87/83/7974 48.6  39.0 - 52.0 % Final   MCV 02/09/2024 90.8  80.0 - 100.0 fL Final   MCH 02/09/2024 30.1  26.0 - 34.0 pg Final   MCHC 02/09/2024 33.1  30.0 - 36.0 g/dL Final   RDW 87/83/7974 12.9  11.5 - 15.5 % Final   Platelets 02/09/2024 321  150 - 400 K/uL Final   nRBC 02/09/2024 0.0  0.0 - 0.2 % Final   Performed at [facility], [address] [place], [place] [address]   Opiates 02/09/2024 NEGATIVE  NEGATIVE Final   Cocaine 02/09/2024 NEGATIVE  NEGATIVE Final   Benzodiazepines 02/09/2024 NEGATIVE  NEGATIVE Final   Amphetamines 02/09/2024 NEGATIVE  NEGATIVE Final   Tetrahydrocannabinol 02/09/2024 POSITIVE (A)  NEGATIVE Final   Barbiturates 02/09/2024 NEGATIVE  NEGATIVE Final   Methadone Scn, Ur 02/09/2024 NEGATIVE  NEGATIVE Final   Fentanyl  02/09/2024 NEGATIVE  NEGATIVE Final   Comment: (NOTE) Drug screen is for Medical Purposes only. Positive results are preliminary only. If confirmation is needed, notify lab within 5 days.  Drug Class                 Cutoff (ng/mL) Amphetamine and metabolites 1000 Barbiturate  and metabolites 200 Benzodiazepine              200 Opiates and metabolites     300 Cocaine and metabolites     300 THC                         50 Fentanyl                     5 Methadone                   300  Trazodone  is metabolized in vivo to several metabolites,  including pharmacologically active m-CPP, which is excreted in the  urine.  Immunoassay screens for amphetamines and MDMA have potential  cross-reactivity with these compounds and may provide false positive  result.  Performed at [facility], [address] [place], [place] [address]   Clinical Support on 01/06/2024  Component Date Value Ref Range Status   Hepatitis B Surface Ag 01/06/2024 Non Reactive   Final   Hepatitis B Surface Ag 01/06/2024 Non Reactive   Final   Hep B Core Total Ab 01/06/2024 Non Reactive   Final  Admission on 11/20/2023, Discharged on 11/21/2023  Component Date Value Ref Range Status   Sodium 11/20/2023 139  135 - 145 mmol/L Final   Potassium 11/20/2023 3.4 (L)  3.5 - 5.1 mmol/L Final   Chloride 11/20/2023 105  98 - 111 mmol/L Final   CO2 11/20/2023 22  22 - 32 mmol/L Final   Glucose, Bld 11/20/2023 99  70 - 99 mg/dL Final   Glucose reference range applies only to samples taken after fasting for at least 8 hours.   BUN 11/20/2023 6  6 - 20 mg/dL Final   Creatinine, Ser 11/20/2023 0.65  0.61 - 1.24 mg/dL Final   Calcium  11/20/2023 8.5 (L)  8.9 - 10.3 mg/dL Final   Total Protein 90/73/7974 8.0  6.5 - 8.1 g/dL Final   Albumin 90/73/7974 3.7  3.5 - 5.0 g/dL Final   AST 90/73/7974 24  15 - 41 U/L Final   ALT 11/20/2023 20  0 - 44 U/L Final   Alkaline Phosphatase 11/20/2023 55  38 - 126 U/L Final   Total Bilirubin 11/20/2023 0.4  0.0 - 1.2 mg/dL Final   GFR, Estimated 11/20/2023 >60  >60 mL/min Final   Comment: (NOTE) Calculated using the CKD-EPI  Creatinine Equation (2021)    Anion gap 11/20/2023 12  5 - 15 Final   Performed at [facility], [address] [place], [place] [address]   Alcohol, Ethyl (B) 11/20/2023 159 (H)  <15 mg/dL Final   Comment: (NOTE) For medical purposes only. Performed at [facility], [address] [place], [place] [address]    WBC 11/20/2023 7.9  4.0 - 10.5 K/uL Final   RBC 11/20/2023 4.68  4.22 - 5.81 MIL/uL Final   Hemoglobin 11/20/2023 13.9  13.0 - 17.0 g/dL Final   HCT 90/73/7974 42.5  39.0 - 52.0 % Final   MCV 11/20/2023 90.8  80.0 - 100.0 fL Final   MCH 11/20/2023 29.7  26.0 - 34.0 pg Final   MCHC 11/20/2023 32.7  30.0 - 36.0 g/dL Final   RDW 90/73/7974 15.1  11.5 - 15.5 % Final   Platelets 11/20/2023 284  150 - 400 K/uL Final   nRBC 11/20/2023 0.0  0.0 - 0.2 % Final   Neutrophils Relative % 11/20/2023 57  % Final   Neutro Abs 11/20/2023 4.6  1.7 - 7.7 K/uL Final   Lymphocytes Relative 11/20/2023 34  % Final   Lymphs Abs 11/20/2023 2.7  0.7 - 4.0 K/uL Final   Monocytes Relative 11/20/2023 7  % Final   Monocytes Absolute 11/20/2023 0.5  0.1 - 1.0 K/uL Final   Eosinophils Relative 11/20/2023 1  % Final   Eosinophils Absolute 11/20/2023 0.1  0.0 - 0.5 K/uL Final   Basophils Relative 11/20/2023 1  % Final   Basophils Absolute 11/20/2023 0.1  0.0 - 0.1 K/uL Final   Immature Granulocytes 11/20/2023 0  % Final   Abs Immature Granulocytes 11/20/2023 0.03  0.00 - 0.07 K/uL Final   Performed at [facility], [address] [place], [place] [address]  Admission on 11/12/2023, Discharged on 11/12/2023  Component Date Value Ref Range Status   WBC 11/12/2023 8.7  4.0 - 10.5 K/uL Final   RBC 11/12/2023 4.33  4.22 - 5.81 MIL/uL Final   Hemoglobin 11/12/2023 13.4  13.0 - 17.0 g/dL Final   HCT 90/81/7974 40.4  39.0 - 52.0 % Final   MCV 11/12/2023 93.3  80.0 - 100.0 fL Final   MCH 11/12/2023 30.9  26.0 - 34.0 pg Final   MCHC 11/12/2023 33.2  30.0 - 36.0 g/dL Final   RDW 90/81/7974 14.8  11.5 - 15.5 % Final   Platelets 11/12/2023 389  150 - 400 K/uL Final   nRBC 11/12/2023 0.0  0.0 - 0.2 %  Final   Neutrophils Relative % 11/12/2023 55  % Final   Neutro Abs 11/12/2023 5.0  1.7 - 7.7 K/uL Final   Lymphocytes Relative 11/12/2023 33  % Final   Lymphs Abs 11/12/2023 2.8  0.7 - 4.0 K/uL Final   Monocytes Relative 11/12/2023 9  % Final   Monocytes Absolute 11/12/2023 0.7  0.1 - 1.0 K/uL Final   Eosinophils Relative 11/12/2023 1  % Final   Eosinophils Absolute 11/12/2023 0.1  0.0 - 0.5 K/uL Final   Basophils Relative 11/12/2023 1  % Final   Basophils Absolute 11/12/2023 0.1  0.0 - 0.1 K/uL Final   WBC Morphology 11/12/2023 See Note   Final   Mild Left Shift (1-5% metas, occ myelo)   RBC Morphology 11/12/2023 See Note   Final   MIXED RBC POPULATION   Smear Review 11/12/2023 Normal platelet morphology   Final   Immature Granulocytes 11/12/2023 1  % Final  Abs Immature Granulocytes 11/12/2023 0.06  0.00 - 0.07 K/uL Final   Performed at [facility], [address] [place], [place] [address]   Sodium 11/12/2023 140  135 - 145 mmol/L Final   Potassium 11/12/2023 3.3 (L)  3.5 - 5.1 mmol/L Final   Chloride 11/12/2023 107  98 - 111 mmol/L Final   CO2 11/12/2023 24  22 - 32 mmol/L Final   Glucose, Bld 11/12/2023 98  70 - 99 mg/dL Final   Glucose reference range applies only to samples taken after fasting for at least 8 hours.   BUN 11/12/2023 9  6 - 20 mg/dL Final   Creatinine, Ser 11/12/2023 0.70  0.61 - 1.24 mg/dL Final   Calcium  11/12/2023 8.2 (L)  8.9 - 10.3 mg/dL Final   GFR, Estimated 11/12/2023 >60  >60 mL/min Final   Comment: (NOTE) Calculated using the CKD-EPI Creatinine Equation (2021)    Anion gap 11/12/2023 9  5 - 15 Final   Performed at [facility], [address] [place], [place] [address]   Alcohol, Ethyl (B) 11/12/2023 140 (H)  <15 mg/dL Final   Comment: (NOTE) For medical purposes only. Performed at [facility], [address] [place], [place] [address]    Acetaminophen  (Tylenol ), Serum 11/12/2023 <10 (L)  10 - 30 ug/mL Final    Comment: (NOTE) Therapeutic concentrations vary significantly. A range of 10-30 ug/mL  may be an effective concentration for many patients. However, some  are best treated at concentrations outside of this range. Acetaminophen  concentrations >150 ug/mL at 4 hours after ingestion  and >50 ug/mL at 12 hours after ingestion are often associated with  toxic reactions.  Performed at [facility], [address] [place], [place] [address]    Salicylate Lvl 11/12/2023 <7.0 (L)  7.0 - 30.0 mg/dL Final   Performed at [facility], [address] [place], [place] [address]   Troponin I (High Sensitivity) 11/12/2023 4  <18 ng/L Final   Comment: (NOTE) Elevated high sensitivity troponin I (hsTnI) values and significant  changes across serial measurements may suggest ACS but many other  chronic and acute conditions are known to elevate hsTnI results.  Refer to the Links section for chest pain algorithms and additional  guidance. Performed at [facility], [address] [place], [place] [address]    Tricyclic, Ur Screen 11/12/2023 NONE DETECTED  NONE DETECTED Final   Amphetamines, Ur Screen 11/12/2023 NONE DETECTED  NONE DETECTED Final   MDMA (Ecstasy)Ur Screen 11/12/2023 NONE DETECTED  NONE DETECTED Final   Cocaine Metabolite,Ur Amelia 11/12/2023 NONE DETECTED  NONE DETECTED Final   Opiate, Ur Screen 11/12/2023 NONE DETECTED  NONE DETECTED Final   Phencyclidine (PCP) Ur S 11/12/2023 NONE DETECTED  NONE DETECTED Final   Cannabinoid 50 Ng, Ur Raymond 11/12/2023 POSITIVE (A)  NONE DETECTED Final   Barbiturates, Ur Screen 11/12/2023 NONE DETECTED  NONE DETECTED Final   Benzodiazepine, Ur Scrn 11/12/2023 NONE DETECTED  NONE DETECTED Final   Methadone Scn, Ur 11/12/2023 NONE DETECTED  NONE DETECTED Final   Comment: (NOTE) Tricyclics + metabolites, urine    Cutoff 1000 ng/mL Amphetamines + metabolites, urine  Cutoff 1000 ng/mL MDMA (Ecstasy), urine              Cutoff  500 ng/mL Cocaine Metabolite, urine          Cutoff 300 ng/mL Opiate + metabolites, urine        Cutoff 300 ng/mL Phencyclidine (PCP), urine  Cutoff 25 ng/mL Cannabinoid, urine                 Cutoff 50 ng/mL Barbiturates + metabolites, urine  Cutoff 200 ng/mL Benzodiazepine, urine              Cutoff 200 ng/mL Methadone, urine                   Cutoff 300 ng/mL  The urine drug screen provides only a preliminary, unconfirmed analytical test result and should not be used for non-medical purposes. Clinical consideration and professional judgment should be applied to any positive drug screen result due to possible interfering substances. A more specific alternate chemical method must be used in order to obtain a confirmed analytical result. Gas chromatography / mass spectrometry (GC/MS) is the preferred confirm                          atory method. Performed at [facility], [address] [place], [place] [address]    Color, Urine 11/12/2023 YELLOW (A)  YELLOW Final   APPearance 11/12/2023 CLEAR (A)  CLEAR Final   Specific Gravity, Urine 11/12/2023 1.019  1.005 - 1.030 Final   pH 11/12/2023 5.0  5.0 - 8.0 Final   Glucose, UA 11/12/2023 NEGATIVE  NEGATIVE mg/dL Final   Hgb urine dipstick 11/12/2023 NEGATIVE  NEGATIVE Final   Bilirubin Urine 11/12/2023 NEGATIVE  NEGATIVE Final   Ketones, ur 11/12/2023 NEGATIVE  NEGATIVE mg/dL Final   Protein, ur 90/81/7974 NEGATIVE  NEGATIVE mg/dL Final   Nitrite 90/81/7974 NEGATIVE  NEGATIVE Final   Leukocytes,Ua 11/12/2023 NEGATIVE  NEGATIVE Final   Performed at [facility], [address] [place], [place] [address]   Troponin I (High Sensitivity) 11/12/2023 4  <18 ng/L Final   Comment: (NOTE) Elevated high sensitivity troponin I (hsTnI) values and significant  changes across serial measurements may suggest ACS but many other  chronic and acute conditions are known to elevate hsTnI results.  Refer to the Links  section for chest pain algorithms and additional  guidance. Performed at [facility], [address] [place], [place] [address]    B Natriuretic Peptide 11/12/2023 6.2  0.0 - 100.0 pg/mL Final   Performed at [facility], [address] [place], [place] [address]  Admission on 11/06/2023, Discharged on 11/10/2023  Component Date Value Ref Range Status   SARS Coronavirus 2 by RT PCR 11/09/2023 NEGATIVE  NEGATIVE Final   Comment: (NOTE) SARS-CoV-2 target nucleic acids are NOT DETECTED.  The SARS-CoV-2 RNA is generally detectable in upper respiratory specimens during the acute phase of infection. The lowest concentration of SARS-CoV-2 viral copies this assay can detect is 138 copies/mL. A negative result does not preclude SARS-Cov-2 infection and should not be used as the sole basis for treatment or other patient management decisions. A negative result may occur with  improper specimen collection/handling, submission of specimen other than nasopharyngeal swab, presence of viral mutation(s) within the areas targeted by this assay, and inadequate number of viral copies(<138 copies/mL). A negative result must be combined with clinical observations, patient history, and epidemiological information. The expected result is Negative.  Fact Sheet for Patients:  bloggercourse.com  Fact Sheet for Healthcare Providers:  seriousbroker.it  This test is no                          t yet approved or cleared by  the [place]  FDA and  has been authorized for detection and/or diagnosis of SARS-CoV-2 by FDA under an Emergency Use Authorization (EUA). This EUA will remain  in effect (meaning this test can be used) for the duration of the COVID-19 declaration under Section 564(b)(1) of the Act, 21 U.S.C.section 360bbb-3(b)(1), unless the authorization is terminated  or revoked sooner.       Influenza A by PCR 11/09/2023  NEGATIVE  NEGATIVE Final   Influenza B by PCR 11/09/2023 NEGATIVE  NEGATIVE Final   Comment: (NOTE) The Xpert Xpress SARS-CoV-2/FLU/RSV plus assay is intended as an aid in the diagnosis of influenza from Nasopharyngeal swab specimens and should not be used as a sole basis for treatment. Nasal washings and aspirates are unacceptable for Xpert Xpress SARS-CoV-2/FLU/RSV testing.  Fact Sheet for Patients: bloggercourse.com  Fact Sheet for Healthcare Providers: seriousbroker.it  This test is not yet approved or cleared by the [place]  FDA and has been authorized for detection and/or diagnosis of SARS-CoV-2 by FDA under an Emergency Use Authorization (EUA). This EUA will remain in effect (meaning this test can be used) for the duration of the COVID-19 declaration under Section 564(b)(1) of the Act, 21 U.S.C. section 360bbb-3(b)(1), unless the authorization is terminated or revoked.     Resp Syncytial Virus by PCR 11/09/2023 NEGATIVE  NEGATIVE Final   Comment: (NOTE) Fact Sheet for Patients: bloggercourse.com  Fact Sheet for Healthcare Providers: seriousbroker.it  This test is not yet approved or cleared by the [place]  FDA and has been authorized for detection and/or diagnosis of SARS-CoV-2 by FDA under an Emergency Use Authorization (EUA). This EUA will remain in effect (meaning this test can be used) for the duration of the COVID-19 declaration under Section 564(b)(1) of the Act, 21 U.S.C. section 360bbb-3(b)(1), unless the authorization is terminated or revoked.  Performed at [facility], [address] [place], [place] [address]   Admission on 11/05/2023, Discharged on 11/06/2023  Component Date Value Ref Range Status   Sodium 11/05/2023 139  135 - 145 mmol/L Final   Potassium 11/05/2023 3.8  3.5 - 5.1 mmol/L Final   Chloride 11/05/2023 101  98  - 111 mmol/L Final   CO2 11/05/2023 27  22 - 32 mmol/L Final   Glucose, Bld 11/05/2023 79  70 - 99 mg/dL Final   Glucose reference range applies only to samples taken after fasting for at least 8 hours.   BUN 11/05/2023 <5 (L)  6 - 20 mg/dL Final   Creatinine, Ser 11/05/2023 0.59 (L)  0.61 - 1.24 mg/dL Final   Calcium  11/05/2023 9.7  8.9 - 10.3 mg/dL Final   Total Protein 90/88/7974 9.0 (H)  6.5 - 8.1 g/dL Final   Albumin 90/88/7974 3.8  3.5 - 5.0 g/dL Final   AST 90/88/7974 24  15 - 41 U/L Final   ALT 11/05/2023 11  0 - 44 U/L Final   Alkaline Phosphatase 11/05/2023 89  38 - 126 U/L Final   Total Bilirubin 11/05/2023 0.3  0.0 - 1.2 mg/dL Final   GFR, Estimated 11/05/2023 >60  >60 mL/min Final   Comment: (NOTE) Calculated using the CKD-EPI Creatinine Equation (2021)    Anion gap 11/05/2023 12  5 - 15 Final   Performed at [facility], [address], [place], [place] [address]   Alcohol, Ethyl (B) 11/05/2023 192 (H)  <15 mg/dL Final   Comment: (NOTE) For medical purposes only. Performed at [facility], [address]  [place], [place] [address]    Opiates 11/05/2023 NEGATIVE  NEGATIVE Final   Cocaine 11/05/2023 NEGATIVE  NEGATIVE Final   Benzodiazepines 11/05/2023 NEGATIVE  NEGATIVE Final   Amphetamines 11/05/2023 NEGATIVE  NEGATIVE Final   Tetrahydrocannabinol 11/05/2023 NEGATIVE  NEGATIVE Final   Barbiturates 11/05/2023 NEGATIVE  NEGATIVE Final   Methadone Scn, Ur 11/05/2023 NEGATIVE  NEGATIVE Final   Fentanyl  11/05/2023 NEGATIVE  NEGATIVE Final   Comment: (NOTE) Drug screen is for Medical Purposes only. Positive results are preliminary only. If confirmation is needed, notify lab within 5 days.  Drug Class                 Cutoff (ng/mL) Amphetamine and metabolites 1000 Barbiturate and metabolites 200 Benzodiazepine              200 Opiates and metabolites     300 Cocaine and metabolites     300 THC                          50 Fentanyl                     5 Methadone                   300  Trazodone  is metabolized in vivo to several metabolites,  including pharmacologically active m-CPP, which is excreted in the  urine.  Immunoassay screens for amphetamines and MDMA have potential  cross-reactivity with these compounds and may provide false positive  result.  Performed at [facility], [address], [place], [place] [address]    WBC 11/05/2023 12.5 (H)  4.0 - 10.5 K/uL Final   RBC 11/05/2023 4.90  4.22 - 5.81 MIL/uL Final   Hemoglobin 11/05/2023 14.2  13.0 - 17.0 g/dL Final   HCT 90/88/7974 45.6  39.0 - 52.0 % Final   MCV 11/05/2023 93.1  80.0 - 100.0 fL Final   MCH 11/05/2023 29.0  26.0 - 34.0 pg Final   MCHC 11/05/2023 31.1  30.0 - 36.0 g/dL Final   RDW 90/88/7974 14.5  11.5 - 15.5 % Final   Platelets 11/05/2023 464 (H)  150 - 400 K/uL Final   nRBC 11/05/2023 0.0  0.0 - 0.2 % Final   Neutrophils Relative % 11/05/2023 69  % Final   Neutro Abs 11/05/2023 8.6 (H)  1.7 - 7.7 K/uL Final   Lymphocytes Relative 11/05/2023 23  % Final   Lymphs Abs 11/05/2023 2.8  0.7 - 4.0 K/uL Final   Monocytes Relative 11/05/2023 6  % Final   Monocytes Absolute 11/05/2023 0.8  0.1 - 1.0 K/uL Final   Eosinophils Relative 11/05/2023 0  % Final   Eosinophils Absolute 11/05/2023 0.1  0.0 - 0.5 K/uL Final   Basophils Relative 11/05/2023 1  % Final   Basophils Absolute 11/05/2023 0.1  0.0 - 0.1 K/uL Final   Immature Granulocytes 11/05/2023 1  % Final   Abs Immature Granulocytes 11/05/2023 0.11 (H)  0.00 - 0.07 K/uL Final   Performed at [facility], [address], [place], [place] [address]   Acetaminophen  (Tylenol ), Serum 11/05/2023 <10 (L)  10 - 30 ug/mL Final   Comment: (NOTE) Toxic concentrations can be more effectively related to post dose interval; > 200, > 100, and > 50 ug/mL serum concentrations correspond to toxic concentrations at 4, 8, and 12 hours post  dose, respectively.  Performed at [facility], [address]  [place], [place] [address]    Salicylate Lvl 11/05/2023 <7.0 (L)  7.0 - 30.0 mg/dL Final   Performed at [facility], [address] [place], [place] [address]   Troponin T High Sensitivity 11/05/2023 <15  0 - 19 ng/L Final   Comment: (NOTE) Biotin concentrations > 1000 ng/mL falsely decrease TnT results.  Serial cardiac troponin measurements are suggested.  Refer to the Links section for chest pain algorithms and additional  guidance. Performed at [facility], [address], [place], [place] [address]     Blood Alcohol level:  Lab Results  Component Value Date   ETH <15 03/15/2024   ETH 87 (H) 02/09/2024    Metabolic Disorder Labs: Lab Results  Component Value Date   HGBA1C 5.2 03/15/2024   MPG 102.54 03/15/2024   MPG 102.54 08/07/2023   No results found for: PROLACTIN Lab Results  Component Value Date   CHOL 258 (H) 03/15/2024   TRIG 70 03/15/2024   HDL 79 03/15/2024   CHOLHDL 3.3 03/15/2024   VLDL 14 03/15/2024   LDLCALC 165 (H) 03/15/2024   LDLCALC 197 (H) 08/07/2023    Therapeutic Lab Levels: No results found for: LITHIUM Lab Results  Component Value Date   VALPROATE 40 (L) 10/07/2019   VALPROATE 84 09/12/2019   No results found for: CBMZ  Physical Findings   AUDIT    Flowsheet Row [facility]) [facility] Ad[facility] (Discharged) from 09[facility]09/2019 in [facility]  Alcohol Use Disorder Identification Test Final Score (AUDIT) 13 6   GAD-7    Flowsheet Row Integrated [facility]al [facility]19[facility]  Total GAD-7 Score 5   PHQ2-9    Flowsheet Row ED from 03/15/2024 in [facility] Most recent reading at 03/18/2024  9:55 AM ED from 03/15/2024 in [facility] Most recent reading at 03/15/2024 11:32 AM ED from 02/09/2024 in [facility] Most recent reading at 02/12/2024 10:02 AM ED from 08/05/2023 in [facility] Most recent reading at 08/09/2023 10:05 AM Integrated Behavioral Health from 07/19/2019 [facility] Most recent reading at 07/19/2019  3:18 PM  PHQ-2 Total Score 5 4 6 2 4   PHQ-9 Total Score 14 12 11 7 10    Flowsheet Row ED from 03/15/2024 in [facility] Most recent reading at 03/15/2024  1:31 PM ED from 03/15/2024 in [facility] Most recent reading at 03/15/2024 10:23 AM ED from 02/09/2024 in [facility] Most recent reading at 02/11/2024 11:43 AM  C-SSRS RISK CATEGORY No Risk High Risk No Risk     Musculoskeletal  Strength & Muscle Tone: within normal limits Gait & Station: normal Patient leans: N/A  Psychiatric Specialty Exam  Presentation  General Appearance:  Appropriate for Environment; Fairly Groomed  Eye Contact: Good  Speech: Clear and Coherent  Speech Volume: Normal  Handedness: Right   Mood and Affect  Mood: Euthymic  Affect: Appropriate; Congruent   Thought Process  Thought Processes: Coherent; Goal Directed  Descriptions of Associations:Intact  Orientation:Full (Time, Place and Person)  Thought Content:WDL; Logical  Diagnosis of Schizophrenia or Schizoaffective disorder in past: No    Hallucinations:Hallucinations: None  Ideas of Reference:None  Suicidal Thoughts:Suicidal Thoughts: No  Homicidal Thoughts:Homicidal Thoughts: No   Sensorium  Memory: Immediate Good; Recent Good  Judgment: Fair  Insight: Fair   Chartered Certified Accountant: Good  Attention Span: Good  Recall: Metta [place] of Knowledge: Good  Language: Good   Psychomotor Activity  Psychomotor Activity: Psychomotor Activity:  Normal   Assets  Assets: Communication Skills; Desire for Improvement; Physical Health; Social Support; Resilience   Sleep  Sleep: Sleep: Good  Estimated Sleeping Duration (Last 24 Hours): 7.50-9.00 hours  No data recorded  Physical Exam  Physical Exam Vitals and nursing note reviewed.  Constitutional:      Appearance: Normal appearance.  HENT:     Head: Normocephalic.     Nose: Nose normal.  Cardiovascular:     Rate and Rhythm: Normal rate.  Pulmonary:     Effort: Pulmonary effort is normal.  Musculoskeletal:        General: Normal range of motion.     Cervical back: Normal range of motion.  Neurological:     Mental Status: He is alert and oriented to person, place, and time.  Psychiatric:        Attention and Perception: Attention normal. He does not perceive auditory or visual hallucinations.        Mood and Affect: Mood and affect normal.        Speech: Speech normal.        Behavior: Behavior normal. Behavior is cooperative.        Thought Content: Thought content normal. Thought content is not paranoid or delusional. Thought content does not include homicidal or suicidal ideation. Thought content does not include homicidal or suicidal plan.        Cognition and Memory: Cognition normal.        Judgment: Judgment normal.    Review of Systems  Constitutional: Negative.   HENT: Negative.    Eyes: Negative.   Respiratory: Negative.    Cardiovascular: Negative.   Gastrointestinal: Negative.   Genitourinary: Negative.   Musculoskeletal: Negative.   Skin: Negative.   Neurological: Negative.   Endo/Heme/Allergies: Negative.   Psychiatric/Behavioral:  Positive for depression and substance abuse. Negative for hallucinations, memory loss and suicidal ideas. The patient is nervous/anxious. The patient does not have insomnia.    Blood pressure 114/74, pulse 90, temperature 98.1 F (36.7 C), resp. rate 17, SpO2 97%. There is no height or weight on file to calculate  BMI.  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 He continues to express interest in attending a long term residential rehab program  like an [facility]. He reports that he has a phone interview with [facility] tonight at 6 pm.  Marvelene Stoneberg, NP 03/18/2024 9:55 AM

## 2024-03-18 NOTE — Care Management (Signed)
 [facility] Care Management...  Writer met with patient too discuss discharge planning.  Patient reported having a phone interview with a [facility] today at 3 pm. Patient also reported also having another phone interview with a different [facility] later today at 6 pm. Patient reported he would be interested in [facility] [employer].  Writer advised not sure if [facility] will accept his insurance

## 2024-03-18 NOTE — ED Notes (Signed)
 Pt denies SI/HI/AVH. No reports of pain. Compliant with scheduled meds. He was calm, cooperative, and pleasant. No behavioral concerns at this time. Pt sleeping in bed, no acute distress noted. Respirations even and unlabored. Continue to monitor for safety.

## 2024-03-19 DIAGNOSIS — F1994 Other psychoactive substance use, unspecified with psychoactive substance-induced mood disorder: Secondary | ICD-10-CM | POA: Diagnosis not present

## 2024-03-19 DIAGNOSIS — F329 Major depressive disorder, single episode, unspecified: Secondary | ICD-10-CM | POA: Diagnosis not present

## 2024-03-19 DIAGNOSIS — F1014 Alcohol abuse with alcohol-induced mood disorder: Secondary | ICD-10-CM | POA: Diagnosis not present

## 2024-03-19 DIAGNOSIS — Z5900 Homelessness unspecified: Secondary | ICD-10-CM | POA: Diagnosis not present

## 2024-03-19 MED ORDER — HYDROXYZINE HCL 25 MG PO TABS: 25.0000 mg | ORAL_TABLET | Freq: Three times a day (TID) | ORAL | 0 refills | Status: AC | PRN

## 2024-03-19 MED ORDER — NALTREXONE HCL 50 MG PO TABS: 50.0000 mg | ORAL_TABLET | Freq: Every day | ORAL | 0 refills | Status: AC

## 2024-03-19 NOTE — ED Provider Notes (Signed)
 FBC/OBS ASAP Discharge Summary  Date and Time: 03/19/2024 10:22 AM  Name: John [name]  MRN:  [id number]   Discharge Diagnoses:  Final diagnoses:  Substance induced mood disorder (HCC)  Polysubstance abuse (HCC)  Alcohol-induced mood disorder with depressive symptoms (HCC)    Subjective: Patient states I am ready to go home today.  I have an interview scheduled with [facility] today at 2 PM and another one at a different [address] later today but either way I want to go home today. Loyed tells this clinical research associate I do know where the warming shelters are, I know where all the shelters are, and I am ready to go!  Patient verbalizes readiness to discharge, if not accepted to [facility] he plans to reside briefly at shelter while continuing to seek sober living, has list of sober living resources.  Patient reports he is frustrated with hospital staff describes staff, including this clinical research associate, as racist.  Reportedly patient responding angrily to attending RN earlier this shift. Patient refused to complete PHQ9, states I am ready to go and it had better not take 4 hours to get me out of here, I am voluntary here.  John [name] is a 44 year old male history including alcohol use disorder, MDD and GAD.  Patient presented voluntarily to [facility] behavioral health on 03/15/2024 request detox from substance including alcohol, cocaine and cannabis.  Patient endorses recent relapse states I have spent $400 over the last week doing bad things (alcohol, cocaine, cannabis) and I am ready to stop it.  Chart reviewed and patient discussed with attending psychiatrist, Dr. Lawrnce, 03/19/2024. Patient is eating a snack, in dining area, upon my approach. John [name] is reassessed by this nurse practitioner face-to-face.  He is seated, denies physical complaint.  He is alert and oriented, pleasant and cooperative during assessment.  Patient presents with anxious mood, labile affect.  Patient continues to  deny SI/HI/AVH.  There is no evidence of delusional thought content and no indication the patient is responding to internal stimuli.  John [name] endorses average sleep and appetite, describes energy level good.  He reports compliance with medications.  He is positive for groups and attends meals in communal dining area.  Patient denies symptoms of alcohol or substance withdrawal.  Denies cravings/urges to use alcohol and substance.  CIWA yesterday measured 1.   Stay Summary: John [name] admitted to facility based crisis unit at [facility] behavioral health urgent care on 03/15/2024. Medications: -Acetaminophen  650 mg every 6 hours, as needed/mild pain -Docusate sodium  100 mg as needed twice daily/mild constipation -Maalox 30 mL oral every 4 hours, as needed/digestion -Hydroxyzine  25 mg 3 times daily as needed/anxiety -Magnesium  hydroxide 30 mL daily as needed/mild constipation -Naltrexone  50 mg daily/alcohol use disorder Prior to admission medications restarted: -Albuterol  Ventolin  HFA 108 (90 base) micrograms/ACT inhaler 2 puffs every 6 hours as needed/wheezing or shortness of breath -Escitalopram  10 mg daily/mood -Gabapentin  600 mg 3 times daily/mood -Trazodone  100 mg nightly, as needed/sleep -NicoDerm 21 mg transdermal patch daily/nicotine  withdrawal  Improvement was monitored by observation and patient's daily report of symptom reduction.  Emotional and mental status  monitored daily by clinical staff.         John [name] was evaluated by the treatment team for stability and plans for continued recovery upon discharge.  His motivation was an integral factor for scheduling further treatment.  Employment, transportation, bed availability, health status, family support, and any pending legal issues were also considered during his hospital stay.  He  was offered further treatment options upon discharge including but not limited to Residential, Intensive Outpatient, and Outpatient treatment.  John [name] will follow up with the list of [facility] locally for sober living.    Upon completion of this admission John [name] was both mentally and medically stable for discharge denying suicidal/homicidal ideation, symptoms that would be consistent with psychosis (AVH, IOR, paranoia, etc).   On my interview today, day of discharge,  03/19/2024, patient is in NAD, alert, oriented, and attentive. Objectively, there is no evidence of psychosis/ mania (able to converse coherently, linear and goal directed thought, no RIS, no distractibility, not pre-occupied, no FOI, etc) nor depression to the point of suicidality (able to concentrate, affect full and reactive, speech normal r/v/t, no psychomotor retardation/agitation, etc).  Overall, patient appears to be at the point, in the absence of inhibiting or disinhibiting symptoms, where he can successfully discharge home.   Current plan includes residing at sober living/[facility] if available.  Otherwise he will reside at local shelter.  He denies access to weapons.  He is not currently employed.  He we will continue to follow-up with peers support and has PCP appointment scheduled for next week.   Patient and family are educated and verbalize understanding of mental health resources and other crisis services in the community. They are instructed to call 911 and present to the nearest emergency room should patient experience any suicidal/homicidal ideation, auditory/visual/hallucinations, or detrimental worsening of mental health condition.      Total Time spent with patient: 30 minutes  Past Psychiatric History: GAD, MDD, alcohol use disorder, polysubstance use disorder, substance-induced mood disorder, suicidal ideation Past Medical History:      Past Medical History:  Diagnosis Date   ASD (atrial septal defect)     Bacterial infection due to H. pylori     Hypertension     Kidney stones            Family History: None reported Family Psychiatric   History: None reported Social History: John [name] is currently homeless in [place].  Most recently resided at sober living facility [facility] and [facility].  He denies access to weapons he receives SSI income.  He has court date scheduled 03/30/2024. Tobacco Cessation:  A prescription for an FDA-approved tobacco cessation medication provided at discharge  Current Medications:  Current Facility-Administered Medications  Medication Dose Route Frequency Provider Last Rate Last Admin   acetaminophen  (TYLENOL ) tablet 650 mg  650 mg Oral Q6H PRN Randall Starlyn HERO, NP   650 mg at 03/18/24 2107   albuterol  (VENTOLIN  HFA) 108 (90 Base) MCG/ACT inhaler 2 puff  2 puff Inhalation Q6H PRN Dasie Ellouise CROME, FNP       alum & mag hydroxide-simeth (MAALOX/MYLANTA) 200-200-20 MG/5ML suspension 30 mL  30 mL Oral Q4H PRN Randall, Veronique M, NP       haloperidol  (HALDOL ) tablet 5 mg  5 mg Oral TID PRN Randall Starlyn HERO, NP       And   diphenhydrAMINE  (BENADRYL ) capsule 50 mg  50 mg Oral TID PRN Randall Starlyn HERO, NP       haloperidol  lactate (HALDOL ) injection 5 mg  5 mg Intramuscular TID PRN Randall Starlyn HERO, NP       And   diphenhydrAMINE  (BENADRYL ) injection 50 mg  50 mg Intramuscular TID PRN Randall Starlyn HERO, NP       And   LORazepam  (ATIVAN ) injection 2 mg  2 mg Intramuscular TID PRN Randall Starlyn HERO, NP  haloperidol  lactate (HALDOL ) injection 10 mg  10 mg Intramuscular TID PRN Randall Starlyn HERO, NP       And   diphenhydrAMINE  (BENADRYL ) injection 50 mg  50 mg Intramuscular TID PRN Randall Starlyn HERO, NP       And   LORazepam  (ATIVAN ) injection 2 mg  2 mg Intramuscular TID PRN Byungura, Veronique M, NP       docusate sodium  (COLACE) capsule 100 mg  100 mg Oral BID PRN Ramzan, Mariam, NP       escitalopram  (LEXAPRO ) tablet 10 mg  10 mg Oral Daily Dasie Ellouise CROME, FNP   10 mg at 03/19/24 0900   gabapentin  (NEURONTIN ) capsule 600 mg  600 mg Oral TID Gottfried, Rhoda  J, MD   600 mg at 03/19/24 0900   hydrOXYzine  (ATARAX ) tablet 25 mg  25 mg Oral TID PRN Ramzan, Mariam, NP   25 mg at 03/18/24 2107   magnesium  hydroxide (MILK OF MAGNESIA) suspension 30 mL  30 mL Oral Daily PRN Randall Starlyn HERO, NP       naltrexone  (DEPADE) tablet 50 mg  50 mg Oral Daily Ramzan, Mariam, NP   50 mg at 03/19/24 0900   nicotine  (NICODERM CQ  - dosed in mg/24 hours) patch 21 mg  21 mg Transdermal Daily Byungura, Veronique M, NP   21 mg at 03/19/24 0900   traZODone  (DESYREL ) tablet 100 mg  100 mg Oral QHS Randall Starlyn M, NP   100 mg at 03/18/24 2107   Current Outpatient Medications  Medication Sig Dispense Refill   albuterol  (VENTOLIN  HFA) 108 (90 Base) MCG/ACT inhaler Inhale 2 puffs into the lungs every 6 (six) hours as needed for wheezing or shortness of breath. 8 g 2   escitalopram  (LEXAPRO ) 10 MG tablet Take 1 tablet (10 mg total) by mouth daily. 30 tablet 0   gabapentin  (NEURONTIN ) 600 MG tablet Take 1 tablet (600 mg total) by mouth 3 (three) times daily. (Patient taking differently: Take 600 mg by mouth 3 (three) times daily as needed (For anxiety).) 90 tablet 0   nicotine  (NICODERM CQ  - DOSED IN MG/24 HOURS) 21 mg/24hr patch Place 1 patch (21 mg total) onto the skin daily.     traZODone  (DESYREL ) 100 MG tablet Take 1 tablet (100 mg total) by mouth at bedtime as needed for sleep. 14 tablet 0    PTA Medications:  Facility Ordered Medications  Medication   acetaminophen  (TYLENOL ) tablet 650 mg   alum & mag hydroxide-simeth (MAALOX/MYLANTA) 200-200-20 MG/5ML suspension 30 mL   magnesium  hydroxide (MILK OF MAGNESIA) suspension 30 mL   haloperidol  (HALDOL ) tablet 5 mg   And   diphenhydrAMINE  (BENADRYL ) capsule 50 mg   haloperidol  lactate (HALDOL ) injection 5 mg   And   diphenhydrAMINE  (BENADRYL ) injection 50 mg   And   LORazepam  (ATIVAN ) injection 2 mg   haloperidol  lactate (HALDOL ) injection 10 mg   And   diphenhydrAMINE  (BENADRYL ) injection 50 mg   And    LORazepam  (ATIVAN ) injection 2 mg   traZODone  (DESYREL ) tablet 100 mg   [COMPLETED] escitalopram  (LEXAPRO ) tablet 10 mg   nicotine  (NICODERM CQ  - dosed in mg/24 hours) patch 21 mg   gabapentin  (NEURONTIN ) capsule 600 mg   albuterol  (VENTOLIN  HFA) 108 (90 Base) MCG/ACT inhaler 2 puff   escitalopram  (LEXAPRO ) tablet 10 mg   hydrOXYzine  (ATARAX ) tablet 25 mg   docusate sodium  (COLACE) capsule 100 mg   naltrexone  (DEPADE) tablet 50 mg   PTA Medications  Medication  Sig   traZODone  (DESYREL ) 100 MG tablet Take 1 tablet (100 mg total) by mouth at bedtime as needed for sleep.   albuterol  (VENTOLIN  HFA) 108 (90 Base) MCG/ACT inhaler Inhale 2 puffs into the lungs every 6 (six) hours as needed for wheezing or shortness of breath.   escitalopram  (LEXAPRO ) 10 MG tablet Take 1 tablet (10 mg total) by mouth daily.   nicotine  (NICODERM CQ  - DOSED IN MG/24 HOURS) 21 mg/24hr patch Place 1 patch (21 mg total) onto the skin daily.   gabapentin  (NEURONTIN ) 600 MG tablet Take 1 tablet (600 mg total) by mouth 3 (three) times daily. (Patient taking differently: Take 600 mg by mouth 3 (three) times daily as needed (For anxiety).)       03/18/2024    9:55 AM 03/16/2024    4:14 PM 03/15/2024   11:32 AM  Depression screen PHQ 2/9  Decreased Interest 2 0 2  Down, Depressed, Hopeless 3 0 2  PHQ - 2 Score 5 0 4  Altered sleeping 1 1 0  Tired, decreased energy 0 1 1  Change in appetite 2 0 2  Feeling bad or failure about yourself  1 2 2   Trouble concentrating 3 1 2   Moving slowly or fidgety/restless 1 1 1   Suicidal thoughts 1 1 0  PHQ-9 Score 14 7 12   Difficult doing work/chores Not difficult at all Somewhat difficult Very difficult    Flowsheet Row ED from 03/15/2024 in [facility] Most recent reading at 03/15/2024  1:31 PM ED from 03/15/2024 in [facility] Most recent reading at 03/15/2024 10:23 AM ED from 02/09/2024 in [facility] Most recent reading at 02/11/2024 11:43 AM  C-SSRS RISK CATEGORY No Risk High Risk No Risk    Musculoskeletal  Strength & Muscle Tone: within normal limits Gait & Station: normal Patient leans: N/A  Psychiatric Specialty Exam  Presentation  General Appearance:  Appropriate for Environment; Casual  Eye Contact: Good  Speech: Clear and Coherent; Normal Rate  Speech Volume: Normal  Handedness: Right   Mood and Affect  Mood: Irritable  Affect: Labile   Thought Process  Thought Processes: Coherent; Goal Directed  Descriptions of Associations:Intact  Orientation:Full (Time, Place and Person)  Thought Content:Logical  Diagnosis of Schizophrenia or Schizoaffective disorder in past: No    Hallucinations:Hallucinations: None  Ideas of Reference:None  Suicidal Thoughts:Suicidal Thoughts: No  Homicidal Thoughts:Homicidal Thoughts: No   Sensorium  Memory: Immediate Good; Recent Good  Judgment: Fair  Insight: Fair   Art Therapist  Concentration: Good  Attention Span: Good  Recall: Good  Fund of Knowledge: Good  Language: Good   Psychomotor Activity  Psychomotor Activity: Psychomotor Activity: Normal   Assets  Assets: Communication Skills; Financial Resources/Insurance; Desire for Improvement; Social Support   Sleep  Sleep: Sleep: Good  Estimated Sleeping Duration (Last 24 Hours): 8.00-9.50 hours  No data recorded  Physical Exam  Physical Exam Vitals and nursing note reviewed.  Constitutional:      Appearance: Normal appearance. He is well-developed.  HENT:     Head: Normocephalic and atraumatic.     Nose: Nose normal.  Cardiovascular:     Rate and Rhythm: Normal rate.  Pulmonary:     Effort: Pulmonary effort is normal.  Musculoskeletal:        General: Normal range of motion.     Cervical back: Normal range of motion.  Skin:    General: Skin is warm and dry.  Neurological:     Mental Status: He is  alert and oriented to person, place, and time.  Psychiatric:        Attention and Perception: Attention and perception normal.        Mood and Affect: Mood is anxious. Affect is labile.        Speech: Speech normal.        Behavior: Behavior normal. Behavior is cooperative.        Thought Content: Thought content normal.        Cognition and Memory: Cognition and memory normal.        Judgment: Judgment normal.    Review of Systems  Constitutional: Negative.   HENT: Negative.    Eyes: Negative.   Respiratory: Negative.    Cardiovascular: Negative.   Gastrointestinal: Negative.   Genitourinary: Negative.   Musculoskeletal: Negative.   Skin: Negative.   Neurological: Negative.   Psychiatric/Behavioral:  Positive for substance abuse. The patient is nervous/anxious.    Blood pressure 124/70, pulse 91, temperature 98.4 F (36.9 C), temperature source Oral, resp. rate 18, SpO2 95%. There is no height or weight on file to calculate BMI.  Demographic Factors:  Male, Caucasian, and Unemployed  Loss Factors: NA  Historical Factors: Impulsivity  Risk Reduction Factors:   Positive social support, Positive therapeutic relationship, and Positive coping skills or problem solving skills  Continued Clinical Symptoms:  Alcohol/Substance Abuse/Dependencies Previous Psychiatric Diagnoses and Treatments  Cognitive Features That Contribute To Risk:  None    Suicide Risk:  Minimal: No identifiable suicidal ideation.  Patients presenting with no risk factors but with morbid ruminations; may be classified as minimal risk based on the severity of the depressive symptoms  Plan Of Care/Follow-up recommendations:  The patient is able to verbalize their individual safety plan to this provider.   # It is recommended to the patient to continue psychiatric medications as prescribed, after discharge from the hospital.    - It is recommended to the patient to follow up with your outpatient  psychiatric provider.   # It was discussed with the patient, the impact of alcohol, drugs, tobacco have been there overall psychiatric and medical wellbeing, and total abstinence from substance use was recommended the patient. -Recommend abstinence from alcohol, tobacco, and other illicit drug use at discharge.    # Prescriptions provided or sent directly to preferred pharmacy at discharge. Patient agreeable to plan. Given opportunity to ask questions. Appears to feel comfortable with discharge.    # In the event of worsening symptoms, the patient is instructed to call the crisis hotline, 911 and or go to the nearest ED for appropriate evaluation and treatment of symptoms. To follow-up with primary care provider for other medical issues, concerns and or health care needs     -Follow-up with outpatient primary care doctor and other specialists -for management of preventative medicine and chronic medical disease.       -If your psychiatric symptoms recur, worsen, or if you have side effects to your psychiatric medications, call your outpatient psychiatric provider, 911, [phone] or go to the nearest emergency department.   -If suicidal thoughts recur, call your outpatient psychiatric provider, 911, [phone] or go to the nearest emergency department.     Disposition: Discharge  Ellouise LITTIE Dawn, FNP 03/19/2024, 10:22 AM

## 2024-03-19 NOTE — ED Notes (Signed)
 Patient approached the nurses station while the witness was doing vitals on another patient and reached his hand over the desk to take some cups that was on the desk, he was informed he could not put touch anything on the nurses station, and he would get cups when the witness was finished with the other patient's vitals, the patient began using profanity, saying  he could have just taken the cups if he wanted to he did not give a fuck. He was advised to leave the desk, and a cup was provided for him, he continued to use profanity in the dining area to the other patients, the nurse and doctor were informed.

## 2024-03-19 NOTE — ED Notes (Signed)
 Pt sleeping in bed, no acute distress noted. Respirations even and unlabored. Continue to monitor for safety.

## 2024-03-19 NOTE — Discharge Instructions (Signed)

## 2024-03-19 NOTE — Discharge Summary (Signed)
 John [name] to be discharged Home per NP order. Discussed with the patient and all questions fully answered. An After Visit Summary was printed and given to the patient. Medication scripts were also given to patient. All belongings returned. Patient escorted out  and discharged home via private auto.  Dorla Jung  03/19/2024 10:39 AM

## 2024-03-19 NOTE — ED Notes (Signed)
 Pt denies SI/HI/AVH. Back pain managed with prn tylenol . Compliant with scheduled meds. He was hyperactive, cooperative, silly, and pleasant. No behavioral concerns at this time. Pt sleeping in bed, no acute distress noted. Respirations even and unlabored. Continue to monitor for safety.

## 2024-03-19 NOTE — Progress Notes (Signed)
 Pt is awake, alert and oriented X4. Pt complained of chronic back pain. No signs of acute distress noted. Pt is labile, argumentative, rude to staff and was instigating other patients. Pt was advised to be respectful to staff. Pt was not receptive. Administered scheduled meds per order. Pt denies current SI/HI/AVH, plan or intent. Staff will monitor for pt's safety.
